# Patient Record
Sex: Female | Born: 1970 | Race: White | Hispanic: No | Marital: Married | Smoking: Former smoker
Health system: Southern US, Community
[De-identification: ages and names within clinical notes are randomized; demographics above are authoritative.]

## PROBLEM LIST (undated history)

## (undated) DIAGNOSIS — E109 Type 1 diabetes mellitus without complications: Secondary | ICD-10-CM

## (undated) DIAGNOSIS — E119 Type 2 diabetes mellitus without complications: Secondary | ICD-10-CM

## (undated) DIAGNOSIS — G629 Polyneuropathy, unspecified: Secondary | ICD-10-CM

## (undated) DIAGNOSIS — E7800 Pure hypercholesterolemia, unspecified: Secondary | ICD-10-CM

## (undated) DIAGNOSIS — F419 Anxiety disorder, unspecified: Secondary | ICD-10-CM

## (undated) DIAGNOSIS — F32A Depression, unspecified: Secondary | ICD-10-CM

## (undated) DIAGNOSIS — E282 Polycystic ovarian syndrome: Secondary | ICD-10-CM

## (undated) DIAGNOSIS — F329 Major depressive disorder, single episode, unspecified: Secondary | ICD-10-CM

## (undated) DIAGNOSIS — I10 Essential (primary) hypertension: Secondary | ICD-10-CM

## (undated) HISTORY — DX: Anxiety disorder, unspecified: F41.9

## (undated) HISTORY — PX: ELBOW SURGERY: SHX618

## (undated) HISTORY — DX: Major depressive disorder, single episode, unspecified: F32.9

## (undated) HISTORY — DX: Type 1 diabetes mellitus without complications: E10.9

## (undated) HISTORY — DX: Depression, unspecified: F32.A

---

## 2012-09-28 ENCOUNTER — Emergency Department (HOSPITAL_COMMUNITY)
Admission: EM | Admit: 2012-09-28 | Discharge: 2012-09-28 | Disposition: A | Discharge status: Home / Self Care | Payer: Self-pay | Attending: Emergency Medicine | Admitting: Emergency Medicine

## 2012-09-28 ENCOUNTER — Encounter (HOSPITAL_COMMUNITY): Payer: Self-pay | Admitting: *Deleted

## 2012-09-28 DIAGNOSIS — IMO0001 Reserved for inherently not codable concepts without codable children: Secondary | ICD-10-CM

## 2012-09-28 DIAGNOSIS — Z8742 Personal history of other diseases of the female genital tract: Secondary | ICD-10-CM | POA: Insufficient documentation

## 2012-09-28 DIAGNOSIS — L239 Allergic contact dermatitis, unspecified cause: Secondary | ICD-10-CM

## 2012-09-28 DIAGNOSIS — R209 Unspecified disturbances of skin sensation: Secondary | ICD-10-CM | POA: Insufficient documentation

## 2012-09-28 DIAGNOSIS — Z87891 Personal history of nicotine dependence: Secondary | ICD-10-CM | POA: Insufficient documentation

## 2012-09-28 DIAGNOSIS — Z8669 Personal history of other diseases of the nervous system and sense organs: Secondary | ICD-10-CM | POA: Insufficient documentation

## 2012-09-28 DIAGNOSIS — E119 Type 2 diabetes mellitus without complications: Secondary | ICD-10-CM | POA: Insufficient documentation

## 2012-09-28 DIAGNOSIS — I10 Essential (primary) hypertension: Secondary | ICD-10-CM

## 2012-09-28 HISTORY — DX: Essential (primary) hypertension: I10

## 2012-09-28 HISTORY — DX: Pure hypercholesterolemia, unspecified: E78.00

## 2012-09-28 HISTORY — DX: Type 2 diabetes mellitus without complications: E11.9

## 2012-09-28 HISTORY — DX: Polyneuropathy, unspecified: G62.9

## 2012-09-28 HISTORY — DX: Polycystic ovarian syndrome: E28.2

## 2012-09-28 MED ORDER — METFORMIN HCL 1000 MG PO TABS: 1000.0000 mg | ORAL_TABLET | Freq: Two times a day (BID) | ORAL | Status: DC

## 2012-09-28 MED ORDER — INSULIN GLULISINE 100 UNIT/ML IJ SOLN: 40.0000 [IU] | Freq: Three times a day (TID) | INTRAMUSCULAR | Status: DC

## 2012-09-28 MED ORDER — ESCITALOPRAM OXALATE 10 MG PO TABS: 10.0000 mg | ORAL_TABLET | Freq: Every day | ORAL | Status: DC

## 2012-09-28 MED ORDER — LISINOPRIL-HYDROCHLOROTHIAZIDE 10-12.5 MG PO TABS: 1.0000 | ORAL_TABLET | Freq: Every day | ORAL | Status: DC

## 2012-09-28 MED ORDER — ATORVASTATIN CALCIUM 10 MG PO TABS: 20.0000 mg | ORAL_TABLET | Freq: Every day | ORAL | Status: DC

## 2012-09-28 MED ORDER — INSULIN GLARGINE 100 UNIT/ML ~~LOC~~ SOLN: 80.0000 [IU] | Freq: Every day | SUBCUTANEOUS | Status: DC

## 2012-09-28 MED ORDER — TRAMADOL HCL 50 MG PO TABS: 50.0000 mg | ORAL_TABLET | Freq: Four times a day (QID) | ORAL | Status: DC | PRN

## 2012-09-28 NOTE — ED Provider Notes (Signed)
CSN: [id number]     Arrival date & time 09/28/12  1727 History  This chart was scribed for non-physician practitioner Wynetta Emery, PA-C working with Gwyneth Sprout, MD by Danella Maiers, ED Scribe. This patient was seen in room TR07C/TR07C and the patient's care was started at 5:40 PM.     Chief Complaint  Patient presents with  . Hip Pain   Patient is a 42 y.o. female presenting with hip pain. The history is provided by the patient. No language interpreter was used.  Hip Pain   HPI Comments: [name] is a 42 y.o. female with a h/o DM who presents to the Emergency Department complaining of left hip pain that started on July 21, self-resolved, then came back as a stabbing pain today while she was walking. She states that pressing on the area makes it worse. She reports that in July her skin was painful to touch and felt as though it were bruised. She saw a doctor the first time she felt the pain and he told her there was nothing wrong. She takes ibuprofen with relief. She was on lantis, humalog, metformin, neurontin for DM but stopped taking it when she lost her insurance. She denies injury, urinary and bowel problems, vaginal discharge, nausea, emesis.   Past Medical History  Diagnosis Date  . Diabetes mellitus without complication   . Hypertension   . High cholesterol   . Neuropathy   . PCOS (polycystic ovarian syndrome)    History reviewed. No pertinent past surgical history. History reviewed. No pertinent family history. History  Substance Use Topics  . Smoking status: Former Games developer  . Smokeless tobacco: Not on file  . Alcohol Use: No   OB History   Grav Para Term Preterm Abortions TAB SAB Ect Mult Living                 Review of Systems A complete 10 system review of systems was obtained and all systems are negative except as noted in the HPI and PMH.   Allergies  Percocet  Home Medications  No current outpatient prescriptions on file. BP 151/94  Pulse  102  Temp(Src) 98.5 F (36.9 C) (Oral)  Resp 22  SpO2 97%  LMP 09/09/2012 Physical Exam  Nursing note and vitals reviewed. Constitutional: She is oriented to person, place, and time. She appears well-developed and well-nourished. No distress.  HENT:  Head: Normocephalic.  Eyes: Conjunctivae and EOM are normal. Pupils are equal, round, and reactive to light.  Neck: Normal range of motion.  Cardiovascular: Normal rate.   Pulmonary/Chest: Effort normal and breath sounds normal. No stridor. No respiratory distress. She has no wheezes. She has no rales.  Abdominal: Soft. Bowel sounds are normal. She exhibits no distension and no mass. There is no tenderness. There is no rebound and no guarding.  Musculoskeletal: Normal range of motion.  No hip pain, full range of motion to left hip, no skin changes or rash. Abdominal exam shows normal bowel sounds with no tenderness to palpation however patient has hypersensitivity to light touch of the left lower cord and abdominal skin  Neurological: She is alert and oriented to person, place, and time.  Psychiatric: She has a normal mood and affect.    ED Course  Procedures (including critical care time) Medications - No data to display  DIAGNOSTIC STUDIES: Oxygen Saturation is 97% on RA, normal by my interpretation.    COORDINATION OF CARE: 7:04 PM- Discussed treatment plan with pt which includes pain  meds, prescription for the DM medications she was on previously, and referral to [facility]. Pt agrees to plan.   Labs Review Labs Reviewed - No data to display Imaging Review No results found.  MDM   1. Cutaneous hypersensitivity   2. IDDM (insulin dependent diabetes mellitus)   3. HTN (hypertension)     Filed Vitals:   09/28/12 1733  BP: 151/94  Pulse: 102  Temp: 98.5 F (36.9 C)  TempSrc: Oral  Resp: 22  SpO2: 97%     [name] is a 42 y.o. female with normal hip and abdominal exam, skin hypersensitiy. She has been  without her insulin, statins and hypertension medication for several months. She scheduled to have her insurance start in December, I have restarted most of her medications and asked her to follow with the wellness clinic.  Pt is hemodynamically stable, appropriate for, and amenable to discharge at this time. Pt verbalized understanding and agrees with care plan. All questions answered. Outpatient follow-up and specific return precautions discussed.    Discharge Medication List as of 09/28/2012  8:01 PM    START taking these medications   Details  atorvastatin (LIPITOR) 10 MG tablet Take 2 tablets (20 mg total) by mouth daily., Starting 09/28/2012, Until Discontinued, Print    escitalopram (LEXAPRO) 10 MG tablet Take 1 tablet (10 mg total) by mouth daily., Starting 09/28/2012, Until Discontinued, Print    insulin glargine (LANTUS) 100 UNIT/ML injection Inject 0.8 mLs (80 Units total) into the skin at bedtime., Starting 09/28/2012, Until Discontinued, Print    insulin glulisine (APIDRA) 100 UNIT/ML injection Inject 40 Units into the skin 3 (three) times daily before meals. 30-50 Units TID, Starting 09/28/2012, Until Discontinued, Print    lisinopril-hydrochlorothiazide (PRINZIDE) 10-12.5 MG per tablet Take 1 tablet by mouth daily., Starting 09/28/2012, Until Discontinued, Print    !! metFORMIN (GLUCOPHAGE) 1000 MG tablet Take 1 tablet (1,000 mg total) by mouth 2 (two) times daily., Starting 09/28/2012, Until Discontinued, Print    traMADol (ULTRAM) 50 MG tablet Take 1 tablet (50 mg total) by mouth every 6 (six) hours as needed for pain., Starting 09/28/2012, Until Discontinued, Print     !! - Potential duplicate medications found. Please discuss with provider.      Note: Portions of this report may have been transcribed using voice recognition software. Every effort was made to ensure accuracy; however, inadvertent computerized transcription errors may be present    Wynetta Emery,  PA-C 09/29/12 0047

## 2012-09-28 NOTE — ED Notes (Signed)
Pharmacy tech paged to do medication reconciliation per PA

## 2012-09-28 NOTE — ED Notes (Signed)
Pt reports having pain left side of back and left hip in July, it resolved by itself, skin was painful to touch but pt denies any rash or blisters. Then had onset again of pain but it is only to left hip area. Denies any injury. Ambulatory at triage.

## 2012-09-29 NOTE — ED Provider Notes (Signed)
Medical screening examination/treatment/procedure(s) were performed by non-physician practitioner and as supervising physician I was immediately available for consultation/collaboration.   Gwyneth Sprout, MD 09/29/12 2316

## 2013-07-19 ENCOUNTER — Encounter (HOSPITAL_COMMUNITY): Payer: Self-pay

## 2013-07-19 ENCOUNTER — Inpatient Hospital Stay (HOSPITAL_COMMUNITY)
Admission: AD | Admit: 2013-07-19 | Discharge: 2013-07-28 | DRG: 885 | Disposition: A | Discharge status: Home / Self Care | Payer: No Typology Code available for payment source | Source: Intra-hospital | Attending: Psychiatry | Admitting: Psychiatry

## 2013-07-19 ENCOUNTER — Emergency Department (HOSPITAL_COMMUNITY)
Admission: EM | Admit: 2013-07-19 | Discharge: 2013-07-19 | Disposition: A | Discharge status: Other Acute Inpatient Hospital | Payer: Self-pay | Attending: Emergency Medicine | Admitting: Emergency Medicine

## 2013-07-19 ENCOUNTER — Encounter (HOSPITAL_COMMUNITY): Payer: Self-pay | Admitting: Emergency Medicine

## 2013-07-19 DIAGNOSIS — I10 Essential (primary) hypertension: Secondary | ICD-10-CM | POA: Diagnosis present

## 2013-07-19 DIAGNOSIS — Z794 Long term (current) use of insulin: Secondary | ICD-10-CM

## 2013-07-19 DIAGNOSIS — F411 Generalized anxiety disorder: Secondary | ICD-10-CM | POA: Diagnosis present

## 2013-07-19 DIAGNOSIS — E7800 Pure hypercholesterolemia, unspecified: Secondary | ICD-10-CM | POA: Insufficient documentation

## 2013-07-19 DIAGNOSIS — E119 Type 2 diabetes mellitus without complications: Secondary | ICD-10-CM | POA: Diagnosis present

## 2013-07-19 DIAGNOSIS — T50902D Poisoning by unspecified drugs, medicaments and biological substances, intentional self-harm, subsequent encounter: Secondary | ICD-10-CM

## 2013-07-19 DIAGNOSIS — Z5989 Other problems related to housing and economic circumstances: Secondary | ICD-10-CM | POA: Diagnosis not present

## 2013-07-19 DIAGNOSIS — F329 Major depressive disorder, single episode, unspecified: Principal | ICD-10-CM | POA: Diagnosis present

## 2013-07-19 DIAGNOSIS — Z8669 Personal history of other diseases of the nervous system and sense organs: Secondary | ICD-10-CM | POA: Insufficient documentation

## 2013-07-19 DIAGNOSIS — R45851 Suicidal ideations: Secondary | ICD-10-CM | POA: Diagnosis not present

## 2013-07-19 DIAGNOSIS — Z87891 Personal history of nicotine dependence: Secondary | ICD-10-CM | POA: Diagnosis not present

## 2013-07-19 DIAGNOSIS — F4310 Post-traumatic stress disorder, unspecified: Secondary | ICD-10-CM | POA: Diagnosis present

## 2013-07-19 DIAGNOSIS — T50902A Poisoning by unspecified drugs, medicaments and biological substances, intentional self-harm, initial encounter: Secondary | ICD-10-CM | POA: Insufficient documentation

## 2013-07-19 DIAGNOSIS — F429 Obsessive-compulsive disorder, unspecified: Secondary | ICD-10-CM | POA: Diagnosis present

## 2013-07-19 DIAGNOSIS — T50904A Poisoning by unspecified drugs, medicaments and biological substances, undetermined, initial encounter: Secondary | ICD-10-CM

## 2013-07-19 DIAGNOSIS — G4700 Insomnia, unspecified: Secondary | ICD-10-CM | POA: Diagnosis present

## 2013-07-19 DIAGNOSIS — G589 Mononeuropathy, unspecified: Secondary | ICD-10-CM | POA: Diagnosis present

## 2013-07-19 DIAGNOSIS — K219 Gastro-esophageal reflux disease without esophagitis: Secondary | ICD-10-CM | POA: Diagnosis present

## 2013-07-19 DIAGNOSIS — E282 Polycystic ovarian syndrome: Secondary | ICD-10-CM | POA: Insufficient documentation

## 2013-07-19 DIAGNOSIS — R51 Headache: Secondary | ICD-10-CM | POA: Diagnosis present

## 2013-07-19 DIAGNOSIS — T424X4A Poisoning by benzodiazepines, undetermined, initial encounter: Secondary | ICD-10-CM | POA: Insufficient documentation

## 2013-07-19 DIAGNOSIS — T438X2A Poisoning by other psychotropic drugs, intentional self-harm, initial encounter: Secondary | ICD-10-CM | POA: Insufficient documentation

## 2013-07-19 DIAGNOSIS — Z5987 Material hardship due to limited financial resources, not elsewhere classified: Secondary | ICD-10-CM

## 2013-07-19 DIAGNOSIS — Z79899 Other long term (current) drug therapy: Secondary | ICD-10-CM | POA: Insufficient documentation

## 2013-07-19 DIAGNOSIS — Z598 Other problems related to housing and economic circumstances: Secondary | ICD-10-CM

## 2013-07-19 DIAGNOSIS — IMO0002 Reserved for concepts with insufficient information to code with codable children: Secondary | ICD-10-CM

## 2013-07-19 DIAGNOSIS — Z3202 Encounter for pregnancy test, result negative: Secondary | ICD-10-CM | POA: Insufficient documentation

## 2013-07-19 DIAGNOSIS — T43502A Poisoning by unspecified antipsychotics and neuroleptics, intentional self-harm, initial encounter: Secondary | ICD-10-CM | POA: Insufficient documentation

## 2013-07-19 LAB — CBC WITH DIFFERENTIAL/PLATELET
BASOS ABS: 0 10*3/uL (ref 0.0–0.1)
Basophils Relative: 1 % (ref 0–1)
EOS PCT: 2 % (ref 0–5)
Eosinophils Absolute: 0.2 10*3/uL (ref 0.0–0.7)
HCT: 39.3 % (ref 36.0–46.0)
Hemoglobin: 14.3 g/dL (ref 12.0–15.0)
LYMPHS ABS: 2.4 10*3/uL (ref 0.7–4.0)
LYMPHS PCT: 27 % (ref 12–46)
MCH: 30.6 pg (ref 26.0–34.0)
MCHC: 36.4 g/dL — ABNORMAL HIGH (ref 30.0–36.0)
MCV: 84 fL (ref 78.0–100.0)
Monocytes Absolute: 0.6 10*3/uL (ref 0.1–1.0)
Monocytes Relative: 6 % (ref 3–12)
NEUTROS ABS: 5.7 10*3/uL (ref 1.7–7.7)
NEUTROS PCT: 64 % (ref 43–77)
Platelets: 265 10*3/uL (ref 150–400)
RBC: 4.68 MIL/uL (ref 3.87–5.11)
RDW: 12.7 % (ref 11.5–15.5)
WBC: 8.8 10*3/uL (ref 4.0–10.5)

## 2013-07-19 LAB — RAPID URINE DRUG SCREEN, HOSP PERFORMED
Amphetamines: NOT DETECTED
BARBITURATES: NOT DETECTED
Benzodiazepines: NOT DETECTED
COCAINE: NOT DETECTED
Opiates: NOT DETECTED
TETRAHYDROCANNABINOL: NOT DETECTED

## 2013-07-19 LAB — COMPREHENSIVE METABOLIC PANEL
ALK PHOS: 70 U/L (ref 39–117)
ALT: 11 U/L (ref 0–35)
AST: 10 U/L (ref 0–37)
Albumin: 2.7 g/dL — ABNORMAL LOW (ref 3.5–5.2)
Anion gap: 18 — ABNORMAL HIGH (ref 5–15)
BUN: 6 mg/dL (ref 6–23)
CO2: 23 meq/L (ref 19–32)
Calcium: 8.2 mg/dL — ABNORMAL LOW (ref 8.4–10.5)
Chloride: 94 mEq/L — ABNORMAL LOW (ref 96–112)
Creatinine, Ser: 0.4 mg/dL — ABNORMAL LOW (ref 0.50–1.10)
GFR calc Af Amer: >90 mL/min (ref >90)
GLUCOSE: 322 mg/dL — AB (ref 70–99)
POTASSIUM: 3.8 meq/L (ref 3.7–5.3)
SODIUM: 135 meq/L — AB (ref 137–147)
Total Bilirubin: 0.4 mg/dL (ref 0.3–1.2)
Total Protein: 6.3 g/dL (ref 6.0–8.3)

## 2013-07-19 LAB — ETHANOL: Alcohol, Ethyl (B): <11 mg/dL (ref 0–11)

## 2013-07-19 LAB — URINALYSIS, ROUTINE W REFLEX MICROSCOPIC
Bilirubin Urine: NEGATIVE
Glucose, UA: >1000 mg/dL — AB
Hgb urine dipstick: NEGATIVE
LEUKOCYTES UA: NEGATIVE
Nitrite: NEGATIVE
PH: 5.5 (ref 5.0–8.0)
Protein, ur: NEGATIVE mg/dL
Specific Gravity, Urine: 1.041 — ABNORMAL HIGH (ref 1.005–1.030)
Urobilinogen, UA: 0.2 mg/dL (ref 0.0–1.0)

## 2013-07-19 LAB — CBG MONITORING, ED: Glucose-Capillary: 221 mg/dL — ABNORMAL HIGH (ref 70–99)

## 2013-07-19 LAB — URINE MICROSCOPIC-ADD ON

## 2013-07-19 LAB — GLUCOSE, CAPILLARY: Glucose-Capillary: 332 mg/dL — ABNORMAL HIGH (ref 70–99)

## 2013-07-19 LAB — POC URINE PREG, ED: Preg Test, Ur: NEGATIVE

## 2013-07-19 LAB — SALICYLATE LEVEL

## 2013-07-19 LAB — ACETAMINOPHEN LEVEL

## 2013-07-19 MED ORDER — ATORVASTATIN CALCIUM 20 MG PO TABS
20.0000 mg | ORAL_TABLET | Freq: Every day | ORAL | Status: DC
  Administered 2013-07-19: 20 mg via ORAL
  Filled 2013-07-19: qty 1

## 2013-07-19 MED ORDER — INSULIN ASPART 100 UNIT/ML ~~LOC~~ SOLN
0.0000 [IU] | Freq: Three times a day (TID) | SUBCUTANEOUS | Status: DC
  Administered 2013-07-20 (×2): 4 [IU] via SUBCUTANEOUS
  Administered 2013-07-20: 15 [IU] via SUBCUTANEOUS
  Administered 2013-07-21: 11 [IU] via SUBCUTANEOUS
  Administered 2013-07-21: 15 [IU] via SUBCUTANEOUS
  Administered 2013-07-22: 4 [IU] via SUBCUTANEOUS
  Administered 2013-07-22 (×2): 7 [IU] via SUBCUTANEOUS
  Administered 2013-07-24: 4 [IU] via SUBCUTANEOUS
  Administered 2013-07-24: 3 [IU] via SUBCUTANEOUS
  Administered 2013-07-24 – 2013-07-26 (×2): 7 [IU] via SUBCUTANEOUS
  Administered 2013-07-26: 11 [IU] via SUBCUTANEOUS
  Administered 2013-07-26: 3 [IU] via SUBCUTANEOUS
  Administered 2013-07-27 (×2): 11 [IU] via SUBCUTANEOUS
  Administered 2013-07-27: 4 [IU] via SUBCUTANEOUS
  Administered 2013-07-28: 11 [IU] via SUBCUTANEOUS
  Administered 2013-07-28: 7 [IU] via SUBCUTANEOUS

## 2013-07-19 MED ORDER — METFORMIN HCL 500 MG PO TABS
1000.0000 mg | ORAL_TABLET | Freq: Two times a day (BID) | ORAL | Status: DC
  Administered 2013-07-19: 1000 mg via ORAL
  Filled 2013-07-19: qty 2

## 2013-07-19 MED ORDER — SODIUM CHLORIDE 0.9 % IV SOLN
INTRAVENOUS | Status: DC
  Administered 2013-07-19: 13:00:00 via INTRAVENOUS

## 2013-07-19 MED ORDER — INSULIN GLARGINE 100 UNIT/ML ~~LOC~~ SOLN
80.0000 [IU] | Freq: Every day | SUBCUTANEOUS | Status: DC
  Filled 2013-07-19: qty 0.8

## 2013-07-19 MED ORDER — INSULIN GLARGINE 100 UNIT/ML ~~LOC~~ SOLN
30.0000 [IU] | Freq: Every day | SUBCUTANEOUS | Status: DC
  Administered 2013-07-19 – 2013-07-26 (×6): 30 [IU] via SUBCUTANEOUS

## 2013-07-19 MED ORDER — HYDROCHLOROTHIAZIDE 12.5 MG PO CAPS: 12.5000 mg | ORAL_CAPSULE | Freq: Every day | ORAL | Status: DC

## 2013-07-19 MED ORDER — ESCITALOPRAM OXALATE 10 MG PO TABS
10.0000 mg | ORAL_TABLET | Freq: Every day | ORAL | Status: DC
  Administered 2013-07-19: 10 mg via ORAL
  Filled 2013-07-19: qty 1

## 2013-07-19 MED ORDER — MAGNESIUM HYDROXIDE 400 MG/5ML PO SUSP: 30.0000 mL | Freq: Every day | ORAL | Status: DC | PRN

## 2013-07-19 MED ORDER — LISINOPRIL 10 MG PO TABS
10.0000 mg | ORAL_TABLET | Freq: Every day | ORAL | Status: AC
  Administered 2013-07-20 – 2013-07-26 (×6): 10 mg via ORAL
  Filled 2013-07-19 (×7): qty 1

## 2013-07-19 MED ORDER — METFORMIN HCL 500 MG PO TABS: 1000.0000 mg | ORAL_TABLET | Freq: Two times a day (BID) | ORAL | Status: DC

## 2013-07-19 MED ORDER — ALUM & MAG HYDROXIDE-SIMETH 200-200-20 MG/5ML PO SUSP: 30.0000 mL | ORAL | Status: DC | PRN

## 2013-07-19 MED ORDER — IBUPROFEN 400 MG PO TABS: 600.0000 mg | ORAL_TABLET | Freq: Three times a day (TID) | ORAL | Status: DC | PRN

## 2013-07-19 MED ORDER — ONDANSETRON HCL 4 MG PO TABS: 4.0000 mg | ORAL_TABLET | Freq: Three times a day (TID) | ORAL | Status: DC | PRN

## 2013-07-19 MED ORDER — NICOTINE 21 MG/24HR TD PT24: 21.0000 mg | MEDICATED_PATCH | Freq: Every day | TRANSDERMAL | Status: DC

## 2013-07-19 MED ORDER — LISINOPRIL-HYDROCHLOROTHIAZIDE 10-12.5 MG PO TABS: 1.0000 | ORAL_TABLET | Freq: Every day | ORAL | Status: DC

## 2013-07-19 MED ORDER — LISINOPRIL 10 MG PO TABS: 10.0000 mg | ORAL_TABLET | Freq: Every day | ORAL | Status: DC
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
## 2013-07-19 NOTE — ED Notes (Signed)
Spoke with the nurse and patient and the patient has agreed to see her 2 visitors.  Call [name] [phone] (boyfriend) or [name] (friend) [phone] if patient is moved.  They have her clothing, cell phone and other belongings.  This nurse ambulated with the visitors to the room and has her belongings at the desk for them to take home with them.

## 2013-07-19 NOTE — ED Notes (Signed)
The pts metformin arrived from pharmacy .  Med given to the pt.  She woke up and read the guidelines to this pod.  No information offered at time given med

## 2013-07-19 NOTE — ED Notes (Signed)
[facility] transport call and [facility]
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
## 2013-07-19 NOTE — ED Notes (Signed)
The pt was wanded when brought back to pod c rm22.  Personal  Valuables placed with  Security.  Clothing one dress only placed in   Room.  Papers placed in log.  Pt supposed to go to [facility] after 1930 tonight.  Pt sleeping unless distrubed
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
## 2013-07-19 NOTE — ED Notes (Signed)
Metformin scheduled at 1700 .  Pharmacy called because it is not in the pyxis.  They will send

## 2013-07-19 NOTE — Tx Team (Signed)
Initial Interdisciplinary Treatment Plan  PATIENT STRENGTHS: (choose at least two) General fund of knowledge  PATIENT STRESSORS: Substance abuse   PROBLEM LIST: Problem List/Patient Goals Date to be addressed Date deferred Reason deferred Estimated date of resolution  ETOH abuse 07/19/13     Risk for Suicide 07/19/13                                                DISCHARGE CRITERIA:  Improved stabilization in mood, thinking, and/or behavior  PRELIMINARY DISCHARGE PLAN: Attend PHP/IOP Attend 12-step recovery group Outpatient therapy  PATIENT/FAMIILY INVOLVEMENT: This treatment plan has been presented to and reviewed with the patient, [name].  The patient and family have been given the opportunity to ask questions and make suggestions.  Jacques Navyhillips, Eston Heslin A 07/19/2013, 10:35 PM
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
## 2013-07-19 NOTE — ED Notes (Signed)
Pt brought from rm 17 to pod c rm 22

## 2013-07-19 NOTE — ED Notes (Signed)
Poison control called

## 2013-07-19 NOTE — Progress Notes (Signed)
Psychoeducational Group Note  Date:  07/19/2013 Time:  2133  Group Topic/Focus:  Wrap-Up Group:   The focus of this group is to help patients review their daily goal of treatment and discuss progress on daily workbooks.  Participation Level: Did Not Attend  Participation Quality:  Not Applicable  Affect:  Not Applicable  Cognitive:  Not Applicable  Insight:  Not Applicable  Engagement in Group: Not Applicable  Additional Comments:  The patient did not attend group this evening since she was admitted to the hallway after the group was completed.   Hazle CocaGOODMAN, Muntaha Vermette S 07/19/2013, 9:33 PM

## 2013-07-20 DIAGNOSIS — F4310 Post-traumatic stress disorder, unspecified: Secondary | ICD-10-CM

## 2013-07-20 DIAGNOSIS — F429 Obsessive-compulsive disorder, unspecified: Secondary | ICD-10-CM

## 2013-07-20 LAB — GLUCOSE, CAPILLARY
GLUCOSE-CAPILLARY: 176 mg/dL — AB (ref 70–99)
GLUCOSE-CAPILLARY: 126 mg/dL — AB (ref 70–99)
Glucose-Capillary: 325 mg/dL — ABNORMAL HIGH (ref 70–99)
Glucose-Capillary: 170 mg/dL — ABNORMAL HIGH (ref 70–99)

## 2013-07-20 LAB — HEMOGLOBIN A1C
HEMOGLOBIN A1C: 13.2 % — AB (ref <5.7)
MEAN PLASMA GLUCOSE: 332 mg/dL — AB (ref <117)

## 2013-07-20 NOTE — [facility] Suicide Risk Assessment (Signed)
   Nursing information obtained from:    Demographic factors:   43 year old female, lives alone, single, employed. Current Mental Status:   See MSE  Loss Factors:   Recent argument with boyfriend Historical Factors:   Recent overdose on Klonopin, denies prior psychiatric history Risk Reduction Factors:    Total Time spent with patient: 45 minutes  CLINICAL FACTORS:   Depression:   Impulsivity  Psychiatric Specialty Exam: Physical Exam  ROS  Blood pressure 129/83, pulse 98, temperature 97.9 F (36.6 C), temperature source Oral, resp. rate 16, height 5\' 3"  (1.6 m), weight 90.719 kg (200 lb), last menstrual period 07/05/2013.Body mass index is 35.44 kg/(m^2).  General Appearance: Fairly Groomed  Patent attorneyye Contact::  Good  Speech:  Normal Rate  Volume:  Normal  Mood:  mildly dysphoric, depressed , irritable  Affect:  Congruent and mildly irritable  Thought Process:  Goal Directed and Linear  Orientation:  NA  Thought Content:  no hallucinations, no delusions  Suicidal Thoughts:  No- denies any current suicidal ideations, no homicidal ideations, and contracts for safety on the unit  Homicidal Thoughts:  No  Memory:  NA  Judgement:  Fair  Insight:  Fair  Psychomotor Activity:  Normal  Concentration:  Good  Recall:  Good  Fund of Knowledge:Good  Language: Good  Akathisia:  Negative  Handed:  Right  AIMS (if indicated):     Assets:  Communication Skills Desire for Improvement Resilience  Sleep:  Number of Hours: 6.75   Musculoskeletal: Strength & Muscle Tone: within normal limits Gait & Station: normal Patient leans: N/A  COGNITIVE FEATURES THAT CONTRIBUTE TO RISK:  Closed-mindedness    SUICIDE RISK:   Moderate:  Frequent suicidal ideation with limited intensity, and duration, some specificity in terms of plans, no associated intent, good self-control, limited dysphoria/symptomatology, some risk factors present, and identifiable protective factors, including available and  accessible social support.  PLAN OF CARE:Patient will be admitted to inpatient psychiatric unit for stabilization and safety. Will provide and encourage milieu participation. Provide medication management and maked adjustments as needed.  Will follow daily.    I certify that inpatient services furnished can reasonably be expected to improve the patient's condition.  COBOS, FERNANDO 07/20/2013, 6:12 PM

## 2013-07-20 NOTE — Progress Notes (Signed)
Psychoeducational Group Note  Date: 07/20/2013 Time: 1100  Group Topic/Focus:  Making Healthy Choices:   The focus of this group is to help patients identify negative/unhealthy choices they were using prior to admission and identify positive/healthier coping strategies to replace them upon discharge.  Participation Level:  Did Not Attend  PAdditional Comments:    07/20/2013,6:34 PM Rich Braveuke, Jacqueli Pangallo Lynn

## 2013-07-20 NOTE — Treatment Plan (Signed)
Spoke with pt's boyfriend and coworker/friend.  They wanted to share that boyfriend will be staying with patient after discharge and that the coworker would be spending time with her at work to make sure she stays safe.  They report that the pt has an appointment with Dr. Ricki MillerPang on Tuesday morning for a mental evaluation.  Dr. Lynne LoganPang's   I explained the 72 hour request for discharge to them both and they verbalized understanding.

## 2013-07-20 NOTE — Progress Notes (Signed)
Patient ID: [name] BossierMichelle XXXUnsworth, female   DOB: Nov 26, 1970, 43 y.o.   MRN: [id number]  Patient currently asleep; no s/s of distress noted at this time. Respirations regular and unlabored.

## 2013-07-20 NOTE — Progress Notes (Signed)
Patient ID: Consuello BossierMichelle XXXUnsworth, female   DOB: 22-Aug-1970, 43 y.o.   MRN: [id number]   D: Pt has been very tearful on the unit today, she reported that she wanted to be discharged. Pt called her family and [facility] because she walked in and she was going to walk out. Pt reported that she has to go to work and that she was going to leave. Pt advised that she could not just walk out, but was made aware of the 72 hour request for discharge. Pt signed the 72 hour request for discharge at [place] Cutter10am, Takia NP was made aware. Pts [facility] at lunch demanding to speak to the doctor reporting that they could take care of patient at home. [facility] was notified, he spoke to family and reinforced that patient could not be discharged. Fredna Dowakia NP was made aware that family wanted to speak to her, Fredna Dowakia reported that she would give family a call.  Pt reported being negative SI/HI, no AH/VH noted. A: 15 min checks continued for patient safety. R: Pt safety maintained.

## 2013-07-20 NOTE — Progress Notes (Signed)
D Pt. Denies SI and HI, no complaints of pain or discomfort noted.  A Writer offered support and encouragement, attempted to discuss reason for admission with pt.  R Pt. Did not want to get out of bed to attend group, is not vested in her treatment, just wants to discharge, refused her lantus this HS, and would only respond she rated depression anxiety and hopelessness all a 0.

## 2013-07-20 NOTE — [facility] Group Notes (Signed)
[facility] Group Notes: (Clinical Social Work)   07/20/2013      Type of Therapy:  Group Therapy   Participation Level:  Did Not Attend    Aliannah Holstrom Grossman-Orr, LCSW 07/20/2013, 3:58 PM     

## 2013-07-20 NOTE — [facility] Group Notes (Signed)
[facility] Group Notes:  (Nursing/MHT/Case Management/Adjunct)  Date:  07/20/2013  Time:  10:53 AM  Type of Therapy:  Psychoeducational Skills  Participation Level:  Did Not Attend   Buford DresserForrest, Naszir Cott Shanta 07/20/2013, 10:53 AM

## 2013-07-20 NOTE — Progress Notes (Signed)
Psychoeducational Group Note  Date:  07/20/2013 Time:  2120  Group Topic/Focus:  Wrap-Up Group:   The focus of this group is to help patients review their daily goal of treatment and discuss progress on daily workbooks.  Participation Level: Did Not Attend  Participation Quality:  Not Applicable  Affect:  Not Applicable  Cognitive:  Not Applicable  Insight:  Not Applicable  Engagement in Group: Not Applicable  Additional Comments:  The patient did not attend group this evening since she was asleep in her bedroom.   Hazle CocaGOODMAN, Yakelin Grenier S 07/20/2013, 9:25 PM
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
## 2013-07-21 DIAGNOSIS — F322 Major depressive disorder, single episode, severe without psychotic features: Secondary | ICD-10-CM

## 2013-07-21 LAB — GLUCOSE, CAPILLARY
GLUCOSE-CAPILLARY: 252 mg/dL — AB (ref 70–99)
Glucose-Capillary: 242 mg/dL — ABNORMAL HIGH (ref 70–99)
Glucose-Capillary: 434 mg/dL — ABNORMAL HIGH (ref 70–99)

## 2013-07-21 MED ORDER — INSULIN GLARGINE 100 UNIT/ML SOLOSTAR PEN: 60.0000 [IU] | PEN_INJECTOR | Freq: Every day | SUBCUTANEOUS | Status: DC | PRN

## 2013-07-21 MED ORDER — ESCITALOPRAM OXALATE 10 MG PO TABS
10.0000 mg | ORAL_TABLET | Freq: Every day | ORAL | Status: DC
  Administered 2013-07-21 – 2013-07-22 (×2): 10 mg via ORAL
  Filled 2013-07-21 (×4): qty 1

## 2013-07-21 MED ORDER — NORTRIPTYLINE HCL 25 MG PO CAPS
25.0000 mg | ORAL_CAPSULE | Freq: Every day | ORAL | Status: DC
  Administered 2013-07-21: 25 mg via ORAL
  Filled 2013-07-21 (×4): qty 1

## 2013-07-21 MED ORDER — IBUPROFEN 200 MG PO TABS
400.0000 mg | ORAL_TABLET | Freq: Four times a day (QID) | ORAL | Status: DC | PRN
  Administered 2013-07-21 – 2013-07-23 (×2): 400 mg via ORAL
  Filled 2013-07-21 (×2): qty 2

## 2013-07-21 MED ORDER — FAMOTIDINE 20 MG PO TABS
40.0000 mg | ORAL_TABLET | Freq: Two times a day (BID) | ORAL | Status: DC
  Administered 2013-07-21 – 2013-07-28 (×15): 40 mg via ORAL
  Filled 2013-07-21 (×9): qty 1
  Filled 2013-07-21: qty 2
  Filled 2013-07-21 (×8): qty 1

## 2013-07-21 MED ORDER — GABAPENTIN 600 MG PO TABS
600.0000 mg | ORAL_TABLET | Freq: Two times a day (BID) | ORAL | Status: DC
  Administered 2013-07-21 – 2013-07-22 (×3): 600 mg via ORAL
  Filled 2013-07-21 (×7): qty 1

## 2013-07-21 MED ORDER — CLONAZEPAM 0.5 MG PO TABS
0.5000 mg | ORAL_TABLET | Freq: Every day | ORAL | Status: DC
  Administered 2013-07-21: 0.5 mg via ORAL
  Filled 2013-07-21: qty 1

## 2013-07-21 MED ORDER — ASPIRIN 81 MG PO CHEW
81.0000 mg | CHEWABLE_TABLET | Freq: Every day | ORAL | Status: DC
  Administered 2013-07-21 – 2013-07-28 (×8): 81 mg via ORAL
  Filled 2013-07-21 (×9): qty 1

## 2013-07-21 MED ORDER — HYDROCHLOROTHIAZIDE 25 MG PO TABS
25.0000 mg | ORAL_TABLET | Freq: Every day | ORAL | Status: DC
  Administered 2013-07-22 – 2013-07-28 (×7): 25 mg via ORAL
  Filled 2013-07-21 (×9): qty 1
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
## 2013-07-21 NOTE — [facility] Group Notes (Signed)
[facility] LCSW Group Therapy  07/21/2013 1:15pm   Pt did not attend, left day room when group began.  Chad CordialLauren Carter, LCSWA 07/21/2013 3:27 PM

## 2013-07-21 NOTE — [facility] Group Notes (Signed)
[facility]  07/21/2013 8:45 AM  Pt did not attend, sleeping in room.  Chad CordialLauren Carter, LCSWA 07/21/2013 10:11 AM

## 2013-07-21 NOTE — Progress Notes (Signed)
Patient ID: Consuello BossierMichelle XXXUnsworth, female   DOB: [birth year], 43 y.o.   MRN: [id number] D- Patient reports fair sleep last night and fair appetite this am.  She says she hasn't eaten since being here because she doesn't like the food.  Her concentration is poor and this morning she had a headache that was affecting the vision in her R eye.  A- NP made aware and patient experienced relief after taking Ibuprophen.  She has also been declining insulin.  She says that she was able to keep her CBG's "perfect when I was having fertility treatments".  Also says she does not want to gain weight on insulin.  Asked patient to consider taking coverage for her CBG's at mealtimes and she agreed to do this. She went to the cafeteria and chose her own carb modified diet.   She continues to spend a lot of time in her room.  Plan to encourage her to attend pm groups now that headache has lifted.

## 2013-07-22 LAB — GLUCOSE, CAPILLARY
GLUCOSE-CAPILLARY: 348 mg/dL — AB (ref 70–99)
GLUCOSE-CAPILLARY: 185 mg/dL — AB (ref 70–99)
Glucose-Capillary: 236 mg/dL — ABNORMAL HIGH (ref 70–99)
Glucose-Capillary: 227 mg/dL — ABNORMAL HIGH (ref 70–99)
Glucose-Capillary: 169 mg/dL — ABNORMAL HIGH (ref 70–99)

## 2013-07-22 MED ORDER — GABAPENTIN 300 MG PO CAPS
600.0000 mg | ORAL_CAPSULE | Freq: Three times a day (TID) | ORAL | Status: DC
  Administered 2013-07-22 – 2013-07-28 (×18): 600 mg via ORAL
  Filled 2013-07-22: qty 84
  Filled 2013-07-22: qty 2
  Filled 2013-07-22: qty 84
  Filled 2013-07-22: qty 2
  Filled 2013-07-22 (×2): qty 84
  Filled 2013-07-22 (×2): qty 2
  Filled 2013-07-22 (×2): qty 84
  Filled 2013-07-22 (×2): qty 2
  Filled 2013-07-22: qty 84
  Filled 2013-07-22: qty 2
  Filled 2013-07-22: qty 84
  Filled 2013-07-22: qty 2
  Filled 2013-07-22: qty 84
  Filled 2013-07-22 (×4): qty 2
  Filled 2013-07-22 (×2): qty 84

## 2013-07-22 MED ORDER — CLONAZEPAM 0.5 MG PO TABS: 0.2500 mg | ORAL_TABLET | Freq: Every day | ORAL | Status: DC

## 2013-07-22 MED ORDER — ESCITALOPRAM OXALATE 5 MG PO TABS
15.0000 mg | ORAL_TABLET | Freq: Every day | ORAL | Status: DC
  Administered 2013-07-23 – 2013-07-24 (×2): 15 mg via ORAL
  Administered 2013-07-25: 08:00:00 via ORAL
  Administered 2013-07-26 – 2013-07-28 (×3): 15 mg via ORAL
  Filled 2013-07-22 (×7): qty 1

## 2013-07-22 MED ORDER — ZOLPIDEM TARTRATE 5 MG PO TABS
5.0000 mg | ORAL_TABLET | Freq: Every evening | ORAL | Status: DC | PRN
  Administered 2013-07-23: 5 mg via ORAL
  Filled 2013-07-22 (×2): qty 1

## 2013-07-22 NOTE — Progress Notes (Signed)
Recreation Therapy Notes  Animal-Assisted Activity/Therapy (AAA/T) Program Checklist/Progress Notes Patient Eligibility Criteria Checklist & Daily Group note for Rec Tx Intervention  Date: 07.21.2015 Time: 3:15pm [address]    AAA/T Program Assumption of Risk Form signed by Patient/ or Parent Legal Guardian yes  Patient is free of allergies or sever asthma yes  Patient reports no fear of animals yes  Patient reports no history of cruelty to animals yes   Patient understands his/her participation is voluntary yes  Behavioral Response: Did not attend.   Marykay Lexenise L Leilyn Frayre, LRT/CTRS  Jearl KlinefelterBlanchfield, Avrom Robarts L 07/22/2013 4:44 PM

## 2013-07-22 NOTE — Progress Notes (Signed)
Patient ID: [name], female   DOB: [birth year], 43 y.o.   MRN: [id number] D- Patient reports her sleep was poor and she did not request sleep med.  Her appetite is poor also.  She is rating hr depression at 8/10, hopelessness at 8/10 and anxiety at 10/10.  She has been having some thoughts of suicide.  Although she feels better than yesterday, she reports she again has a headache.  She did not write a goal for today and she is unsure of her discharge plan.  A- Patient's BP was  initially low this am and BP meds held,but then given when manuel recheck was within normal limits.  Supported patient.  R-Patient is attending groups and interacting with peers and staff.

## 2013-07-22 NOTE — Progress Notes (Signed)
Pt resting in bed with eyes closed.  No distress observed.  Respirations even/unlabored.  Safety maintained with q15 minute checks. 

## 2013-07-22 NOTE — Progress Notes (Signed)
Adult Psychoeducational Group Note  Date:  07/22/2013 Time:  10:55 PM  Group Topic/Focus:  Goals Group:   The focus of this group is to help patients establish daily goals to achieve during treatment and discuss how the patient can incorporate goal setting into their daily lives to aide in recovery.  Participation Level:  Active  Participation Quality:  Appropriate  Affect:  Appropriate  Cognitive:  Appropriate  Insight: Appropriate  Engagement in Group:  Engaged  Modes of Intervention:  Discussion  Additional Comments:  Pt stated that waking up and being alive is all she is happy about.  Terie PurserParker, Ivan Lacher R 07/22/2013, 10:55 PM
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
## 2013-07-22 NOTE — Progress Notes (Signed)
Patient is laughing and joking with peers. Flirting with a female patient. Patient stated that she did not need to be on a 1:1 anymore because she was fine. Patient educated that her previous behavior is the basis for the 1:1 and her inability to contract for safety. Patient upset that she will not be able to participate in activities with her peers, such as dinner. Will continue to monitor.
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
## 2013-07-22 NOTE — [facility] Group Notes (Signed)
[facility] Group Notes:  orientation Date:  07/22/2013  Time:  10:00 AM  Type of Therapy:  Nurse Education  Participation Level:  Active  Participation Quality:  Appropriate  Affect:  Appropriate  Cognitive:  Alert  Insight:  Appropriate  Engagement in Group:  Engaged  Modes of Intervention:  Discussion  Summary of Progress/Problems:  Nicole CellaWebb, Kinzie Wickes Guyes 07/22/2013, 10:00 AM
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
## 2013-07-22 NOTE — Progress Notes (Signed)
Patient signed a 72 hour form at 2230. Form is on front of chart.
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
## 2013-07-22 NOTE — Progress Notes (Signed)
Patient is becoming increasingly agitated because she wants to leave. Patient is cussing and saying that she has no rights. Writer spoke with patient and she stated that she "is over 18 and we have not arrested her, so why can't I walk out the fucking door." Patient offered a 72 hour form, and patient declined. AC called and asked to speak with patient. AC explained that patient had to be evaluated by a physician before she could be discharged, and patient stated that she wasn't talking to the doctor that just sat and typed and changed her medications, because he didn't know her. Patient is angry that she is on a one to one. Patient educated about why she is on a one to one and stated that she "didn't need a fucking babysitter." Patient offered nighttime medications and refused. Patient stated that until she was discharged, she was going to refuse to eat, or take her medicine. Patient walked back to room. Will continue to monitor.

## 2013-07-23 LAB — GLUCOSE, CAPILLARY
GLUCOSE-CAPILLARY: 226 mg/dL — AB (ref 70–99)
Glucose-Capillary: 169 mg/dL — ABNORMAL HIGH (ref 70–99)
Glucose-Capillary: 281 mg/dL — ABNORMAL HIGH (ref 70–99)
Glucose-Capillary: 250 mg/dL — ABNORMAL HIGH (ref 70–99)

## 2013-07-23 MED ORDER — HYDROXYZINE HCL 50 MG PO TABS
ORAL_TABLET | ORAL | Status: AC
Start: 2013-07-23 — End: 2013-07-24
  Filled 2013-07-23: qty 1

## 2013-07-23 MED ORDER — HYDROXYZINE HCL 50 MG PO TABS
50.0000 mg | ORAL_TABLET | Freq: Once | ORAL | Status: AC
  Administered 2013-07-23: 50 mg via ORAL

## 2013-07-23 NOTE — Progress Notes (Signed)
Patient continues on 1:1. Resting in bed with eyes closed. Voicing no complaints. Safety maintained. [name] CoastGoodman, Hasset Chaviano K, RN

## 2013-07-23 NOTE — Progress Notes (Signed)
Adult Psychoeducational Group Note  Date:  07/23/2013 Time:  10:00am Group Topic/Focus:  Personal Choices and Values:   The focus of this group is to help patients assess and explore the importance of values in their lives, how their values affect their decisions, how they express their values and what opposes their expression.  Participation Level:  Minimal  Participation Quality:  Appropriate and Attentive  Affect:  Appropriate  Cognitive:  Alert and Appropriate  Insight: Appropriate and Good  Engagement in Group:  Engaged  Modes of Intervention:  Discussion and Education  Additional Comments:   Pt attended and participated in group. Discussion was on personal development and what it means to you. Pt stated personal development means improvement on self continuously.  Shelly BombardGarner, Bonifacio Pruden D 07/23/2013, 4:19 PM

## 2013-07-23 NOTE — Progress Notes (Signed)
1:1 Nursing Note  D: Patient is sitting in dayroom, coloring a picture and conversing with peers.  A: Continue 1:1 patient observation and Q15 minute checks for safety.  R: Patient receptive and safe on the unit.

## 2013-07-23 NOTE — Progress Notes (Signed)
Patient continues on 1:1. Resting in bed with eyes closed. Safety maintained. [name], Kierstan Auer K, RN

## 2013-07-23 NOTE — Progress Notes (Signed)
1:1 Nursing Note  D: Patient in dayroom, participating in group; calm, cooperative; no signs or symptoms of distress noted.  A: Maintain 1:1 patient observation and Q15 minute checks for safety.  R: Patient remains safe on the unit.

## 2013-07-23 NOTE — Progress Notes (Signed)
D: Patient's affect appropriate to circumstance and mood is anxious. She reported on the self inventory sheet that sleep and ability to concentrate are both good, appetite is poor, and energy level is high. Patient rated depression and feelings of hopelessness "0" and anxiety "2". She's attending groups and visible in the milieu. Patient adheres to current medication regimen.  A: Support and encouragement provided to patient. Administered scheduled medications per ordering MD. Monitor Q15 minute checks for safety.  R: Patient receptive. Denies SI/HI/AVH. Patient remains safe on the unit.

## 2013-07-23 NOTE — Progress Notes (Signed)
1:1 Patient observation has been discontinued per MD. Patient in the hallway with peers, conversing and laughing with others. No signs or symptoms noted. Patient remains safe on the unit.
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
## 2013-07-23 NOTE — [facility] Group Notes (Addendum)
[facility] LCSW Aftercare Discharge Planning Group Note   07/23/2013  10:11 AM   Participation QualUh [facility]ity: Alert, Appropriate and Oriented  Mood/Affect: Euthymic and Appropriate   Depression Rating: 0  Anxiety Rating: 0  Thoughts of Suicide: Pt denies SI/HI  Will you contract for safety? Yes  Current AVH: Pt denies  Plan for Discharge/Comments: Pt attended discharge planning group and actively participated in group. CSW provided pt with today's workbook. Patient reports that she is feeling "good" today and reports that her plan is to [place] at [place] and reports that she will have transportation home. She denies having a current mental health outpatient provider at this time and denies wanting to start outpatient services at this time. After group, patient and CSW discussed [facility] and CSW offered to provide information to patient, patient agreeable.   Transportation Means: Pt reports access to transportation  Supports: No supports mentioned at this time  Samuella BruinKristin Benson Porcaro, MSW, [employer]CSWA Clinical Social Worker [facility] [phone]

## 2013-07-23 NOTE — Progress Notes (Signed)
Patient continues on 1:1. Refused her insulin coverage this morning. Stated that she was not going to take her insulin because she was not planning on eating breakfast, that she was just planning on having coffee and she did not think it would be a good idea to take her insulin at this time. CBG 169. Billy CoastGoodman, Abdias Hickam K, RN

## 2013-07-23 NOTE — [facility] Suicide Risk Assessment (Signed)
[facility] INPATIENT:  Family/Significant Other Suicide Prevention Education  Suicide Prevention Education:  Education Completed; [name] - friend [phone]),  (name of family member/significant other) has been identified by the patient as the family member/significant other with whom the patient will be residing, and identified as the person(s) who will aid the patient in the event of a mental health crisis (suicidal ideations/suicide attempt).  With written consent from the patient, the family member/significant other has been provided the following suicide prevention education, prior to the and/or following the discharge of the patient.  The suicide prevention education provided includes the following:  Suicide risk factors  Suicide prevention and interventions  National Suicide Hotline telephone number  [facility] assessment telephone number  [facility] Emergency Assistance 911  [facility] and/or Residential Mobile Crisis Unit telephone number  Request made of family/significant other to:  Remove weapons (e.g., guns, rifles, knives), all items previously/currently identified as safety concern.    Remove drugs/medications (over-the-counter, prescriptions, illicit drugs), all items previously/currently identified as a safety concern.  The family member/significant other verbalizes understanding of the suicide prevention education information provided.  The family member/significant other agrees to remove the items of safety concern listed above.  [name], [name] [name] 07/23/2013, 2:33 PM

## 2013-07-24 LAB — GLUCOSE, CAPILLARY
Glucose-Capillary: 149 mg/dL — ABNORMAL HIGH (ref 70–99)
Glucose-Capillary: 219 mg/dL — ABNORMAL HIGH (ref 70–99)
Glucose-Capillary: 155 mg/dL — ABNORMAL HIGH (ref 70–99)

## 2013-07-24 MED ORDER — BACITRACIN-NEOMYCIN-POLYMYXIN 400-5-5000 EX OINT
TOPICAL_OINTMENT | CUTANEOUS | Status: DC | PRN
  Administered 2013-07-24 – 2013-07-26 (×2): 1 via TOPICAL
  Filled 2013-07-24 (×2): qty 1

## 2013-07-24 MED ORDER — DIVALPROEX SODIUM ER 500 MG PO TB24
500.0000 mg | ORAL_TABLET | Freq: Every day | ORAL | Status: DC
  Administered 2013-07-24 – 2013-07-25 (×2): 500 mg via ORAL
  Filled 2013-07-24: qty 14
  Filled 2013-07-24 (×3): qty 1
  Filled 2013-07-24: qty 14

## 2013-07-24 NOTE — Progress Notes (Signed)
During and between groups patient was observed talking with another patient and rehearsing answers and body language to present to doctor to get off of 1:1. Every time other patient left and spoke to staff and returned to patients side they began to talk about other patient encounter and what for patient to say to get off of 1:1. Rn Educational psychologistand Charge Nurse notified.

## 2013-07-24 NOTE — Progress Notes (Signed)
[facility] Post 1:1 Observation Documentation  For the first (8) hours following discontinuation of 1:1 precautions, a progress note entry by nursing staff should be documented at least every 2 hours, reflecting the patient's behavior, condition, mood, and conversation.  Use the progress notes for additional entries.  Time 1:1 discontinued:  1105 AM  Patient's Behavior:  Patient is seen in the day room talking with peers. Patient is pleasant with this Clinical research associatewriter and staff involved in her care.  Patient's Condition:  No distress noted.  Patient's Conversation:  Patient is speaking with another patient but the conversation is not heard by this Clinical research associatewriter.  Ned ClinesDopson, Rylynn Kobs E 07/24/2013, 2:26 PM

## 2013-07-24 NOTE — Progress Notes (Signed)
Adult Psychoeducational Group Note  Date:  07/24/2013 Time:  10:00am Group Topic/Focus:  Making Healthy Choices:   The focus of this group is to help patients identify negative/unhealthy choices they were using prior to admission and identify positive/healthier coping strategies to replace them upon discharge.  Participation Level:  Active  Participation Quality:  Appropriate and Attentive  Affect:  Appropriate  Cognitive:  Alert and Appropriate  Insight: Appropriate  Engagement in Group:  Engaged  Modes of Intervention:  Discussion and Education  Additional Comments:  Pt attended and participated in group. Group discussion was on making lifestyle changes. The question was asked what change can you make to improve your life? Pt stated I am not making any changes.   Shelly BombardGarner, Seydina Holliman D 07/24/2013, 11:24 AM

## 2013-07-24 NOTE — Progress Notes (Signed)
[facility] Post 1:1 Observation Documentation  For the first (8) hours following discontinuation of 1:1 precautions, a progress note entry by nursing staff should be documented at least every 2 hours, reflecting the patient's behavior, condition, mood, and conversation.  Use the progress notes for additional entries.  Time 1:1 discontinued:  1105  Patient's Behavior:  Patient seen in the hallway. Patient just exited MD's office. Patient calm with flat affect and depressed mood.  Patient's Condition:  No distress noted. Respirations are even and unlabored.  Patient's Conversation:  No conversation at this time.  [name] E 07/24/2013, 11:11 AM

## 2013-07-24 NOTE — Progress Notes (Signed)
[facility] Post 1:1 Observation Documentation  For the first (8) hours following discontinuation of 1:1 precautions, a progress note entry by nursing staff should be documented at least every 2 hours, reflecting the patient's behavior, condition, mood, and conversation.  Use the progress notes for additional entries.  Time 1:1 discontinued:  Late entry for 07/23/13 18:00 pm  Patient's Behavior:  Calm, cooperative. She's in the dayroom conversing amongst peers.  Patient's Condition: Presents with anxious affect and mood.  Patient's Conversation: Patient voiced that she felt relieved to not have someone standing over her like a bodyguard. Also, stating she did not like having someone to be with her at all times and less freedom. Pt. Remains safe on the unit.  Harold BarbanByrd, Ronecia E 07/24/2013, 7:27 AM

## 2013-07-24 NOTE — Progress Notes (Signed)
Patient ID: [name], female   DOB: May 05, 1970, 43 y.o.   MRN: [id number]  Nursing 1:1 Note 0900-  Patient is seen in the dayroom with MHT within reach. Patient is speaking with a peer. Her affect is flat and mood is appropriate to circumstance. Patient's respirations are even and unlabored. No distress noted. 1:1 is continued for safety.

## 2013-07-24 NOTE — Progress Notes (Signed)
Patient ID: [name] BossierMichelle [name], female   DOB: 1970/12/01, 43 y.o.   MRN: [id number] D: client sitting up in bed talking to roommate and coloring. A: Writer introduced herself to client, encouraged her to verbalized any concerns. Staff will monitor 1:1 for safety. R: Pt. Is safe on the unit.

## 2013-07-24 NOTE — Progress Notes (Deleted)
[facility] :  Will you be returning to the same living situation after discharge: Yes,  can return [place], friends are supportive At discharge, do you have transportation home?:Yes,  friend will pick pt up Do you have the ability to pay for your medications:Yes,  provided pt with samples and prescriptions and pt verbalizes ability to afford meds  Release of information consent forms completed and in the chart;  Patient's signature needed at discharge.  Patient to Follow up at: Follow-up Information   Follow up with Dr. Juline Patchichard Pang On 07/30/2013. (for a follow-up with Dr. Ricki MillerPang.  Your appointment is at 10:15am)    Contact information:   [facility] [address], [place] [address] [phone]      Patient denies SI/HI:   Yes,  denies SI/HI    Safety Planning and Suicide Prevention discussed:  Yes,  discussed with pt and pt's friend.  See suicide prevention education note.   Carmina MillerHorton, Mikaylah Libbey Nicole 07/24/2013, 9:36 AM
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
## 2013-07-24 NOTE — Progress Notes (Signed)
[facility] Post 1:1 Observation Documentation  For the first (8) hours following discontinuation of 1:1 precautions, a progress note entry by nursing staff should be documented at least every 2 hours, reflecting the patient's behavior, condition, mood, and conversation.  Use the progress notes for additional entries.  Time 1:1 discontinued:  1105  Patient's Behavior:  Patient is calm, mood depressed, and affect flat. Patient is coming out of her room when Clinical research associatewriter approached.  Patient's Condition:  No distress noted. Gait is steady with no signs of pain.  Patient's Conversation:  Patient states, "the neosporin is helping." Patient shows Clinical research associatewriter her lower extremities to reveal the cuts patient reports were caused by her cat scratching her. Patient comes the medication window to receive her evening medication. Patient and writer discuss the patient's need to eat because patient reports she has not eaten in three days. However, her CBG does not reflect this. Patient was encouraged by writer to eat. Patient reports, "I will when I go home. Now that -enter a discharged patient's name- is at my house I will have to cook." Writer reported to patient that [facility] discour[facility] that or patient's exchanging phone numbers or any information. Patient verbalized understanding.   Marzetta BoardDopson, Guinn Delarosa E 07/24/2013, 5:20 PM

## 2013-07-24 NOTE — Progress Notes (Signed)
[facility] Post 1:1 Observation Documentation  For the first (8) hours following discontinuation of 1:1 precautions, a progress note entry by nursing staff should be documented at least every 2 hours, reflecting the patient's behavior, condition, mood, and conversation.  Use the progress notes for additional entries.  Time 1:1 discontinued:  1105  Patient's Behavior:  Patient is calm when speaking with two other female patients. Patient is standing in the hallway speaking to them. Patient's respirations are even and unlabored.   Patient's Condition:  No distress noted.   Patient's Conversation:  Patient is speaking with two other female patient's in the hallway. Patient is heard saying, "who is there?" Patient is speaking in low tone and stops talking when writer gets closer.  Marzetta BoardDopson, Shawan Tosh E 07/24/2013, 3:09 PM

## 2013-07-24 NOTE — Progress Notes (Signed)
[facility] Post 1:1 Observation Documentation  For the first (8) hours following discontinuation of 1:1 precautions, a progress note entry by nursing staff should be documented at least every 2 hours, reflecting the patient's behavior, condition, mood, and conversation.  Use the progress notes for additional entries.  Time 1:1 discontinued:  16:09 pm  Patient's Behavior: Pt cooperative, sullen in mood.  Pt in dayroom interacting with other patients.   Patient's Condition:  Appropriate to circumstance  Patient's Conversation:  Pt shared she is feeling better and denies SI/HI/AVH.  Pt stated she "has gotten off the 1:1 and is happy."  Pt verbally contracted for safety agreeing to seek out any staff member if she was having thoughts of harming herself and she agreed to do so.  When pt was asked about her medications, pt shared she "is not a compliant diabetic because she does not want to gain weight and her insulin makes her do so."  Pt shared with staff she has not eaten in 2 days.  Pt agreed to take her Lantus insulin at HS.  Support and encouragement given.  Pt pleasant and receptive.  [name], [name] 07/24/2013, 1:13 AM

## 2013-07-24 NOTE — Clinical Social Work Note (Signed)
CSW returned [name]'s call back, [name]'s friend/coworker [phone]).  CSW received consent to talk to [name] from [name].  [name] explained that she is concerned about [name]'s safety.  [name] explained that she doesn't think [name] is being genuine about what it actually going on.  [name] states that [name] made a comment last night that she will "finish the job" referring to suicide, when she returns home.  [name] states that she feels [name] needs additional support/follow up than her PCP.  CSW told [name] that CSW would refer this info to MD and talk with [name] again about additional resources.    Gangl IvanChelsea Horton, LCSW 07/24/2013  2:43 PM

## 2013-07-24 NOTE — Progress Notes (Signed)
[facility] Post 1:1 Observation Documentation  For the first (8) hours following discontinuation of 1:1 precautions, a progress note entry by nursing staff should be documented at least every 2 hours, reflecting the patient's behavior, condition, mood, and conversation.  Use the progress notes for additional entries.  Time 1:1 discontinued:  1105  Patient's Behavior:  Patient is calm sitting in the dayroom next to another female patient. Patient appears calm and in no distress at this time. Patient's mood is anxious and affect anxious as well.   Patient's Condition:  No distress noted. Patient's respirations even and unlabored.  Patient's Conversation:  Patient is speaking too low for writer to hear within reasonable range.  Ned ClinesDopson, Alik Mawson E 07/24/2013, 7:30 PM

## 2013-07-24 NOTE — [facility] Group Notes (Addendum)
[facility] LCSW Group Therapy  07/24/2013  1:15 PM   Type of Therapy:  Group Therapy  Participation Level:  Minimal  Participation Quality:  Minimal  Affect:  Depressed and Flat  Cognitive:  Alert and Oriented  Insight:  Limited, Lacking  Engagement in Therapy:  Limited, Lacking   Modes of Intervention:  Clarification, Confrontation, Discussion, Education, Exploration, Limit-setting, Orientation, Problem-solving, Rapport Building, Dance movement psychotherapisteality Testing, Socialization and Support  Summary of Progress/Problems: The topic for group was balance in life.  Today's group focused on defining balance in one's own words, identifying things that can knock one off balance, and exploring healthy ways to maintain balance in life. Group members were asked to provide an example of a time when they felt off balance, describe how they handled that situation,and process healthier ways to regain balance in the future. Group members were asked to share the most important tool for maintaining balance that [facility] and how they plan to apply this method after discharge.  Pt was quiet and chose not to share in group discussion but appeared to actively listen.    [name] IvanChelsea Horton, LCSW 07/24/2013  2:34 PM
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
## 2013-07-24 NOTE — Progress Notes (Signed)
Adult Psychoeducational Group Note  Date:  07/24/2013 Time:  10:47 PM  Group Topic/Focus:  Wrap-Up Group:   The focus of this group is to help patients review their daily goal of treatment and discuss progress on daily workbooks.  Participation Level:  Active  Participation Quality:  Appropriate  Affect:  Appropriate  Cognitive:  Appropriate  Insight: Appropriate  Engagement in Group:  Engaged  Modes of Intervention:  Activity  Additional Comments:  Pt attended karaoke group.  Wynema BirchCagle, Egidio Lofgren D 07/24/2013, 10:47 PM

## 2013-07-24 NOTE — [facility] Group Notes (Signed)
[facility] Group Notes:  (Nursing/MHT/Case Management/Adjunct)  Date:  07/24/2013  Time:  11:30 AM  Type of Therapy:  Nurse Education  Participation Level:  Minimal  Participation Quality:  Appropriate  Affect:  Depressed and Flat  Cognitive:  Alert  Insight:  None  Engagement in Group:  Engaged  Modes of Intervention:  Discussion and Education  Summary of Progress/Problems: Patient attended group and was engaged but did not speak during group. Patient did participate in morning stretching exercises.  Zygmund Passero E 07/24/2013, 11:30 AM
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
## 2013-07-24 NOTE — Progress Notes (Signed)
Patient ID: [name], female   DOB: Feb 05, 1970, 43 y.o.   MRN: [id number]  D: Pt. Denies SI/HI and A/V Hallucinations. Patient does not report any pain at this time. Patient reports that she has cuts on her lower extremities due to her cats. Patient was given Neosporin and this is helping her cuts on her lower extremities. Patient rates her depression and anxiety at 0/10 for the day. Patient rates that she slept well and her concentration is good.  A: Support and encouragement provided to the patient to come to writer with any questions or concerns. Writer has observed this patient more closely throughout the day due to activity on the unit that appeared inappropriate. Scheduled medications administered to patient per physician's orders.  R: Patient is receptive and cooperative but continues to flirt with men on the hall. Patient is frequently seen in the milieu and quiets her conversations when writer walks by. Patient is attending groups. Q15 minute checks are maintained for safety.

## 2013-07-25 LAB — GLUCOSE, CAPILLARY
GLUCOSE-CAPILLARY: 162 mg/dL — AB (ref 70–99)
GLUCOSE-CAPILLARY: 153 mg/dL — AB (ref 70–99)
Glucose-Capillary: 119 mg/dL — ABNORMAL HIGH (ref 70–99)
Glucose-Capillary: 77 mg/dL (ref 70–99)
Glucose-Capillary: 245 mg/dL — ABNORMAL HIGH (ref 70–99)

## 2013-07-25 MED ORDER — BACITRACIN-NEOMYCIN-POLYMYXIN 400-5-5000 EX OINT: TOPICAL_OINTMENT | CUTANEOUS | Status: DC | PRN

## 2013-07-25 MED ORDER — DIVALPROEX SODIUM ER 500 MG PO TB24: 500.0000 mg | ORAL_TABLET | Freq: Every day | ORAL | Status: DC

## 2013-07-25 MED ORDER — FAMOTIDINE 40 MG PO TABS: 40.0000 mg | ORAL_TABLET | Freq: Two times a day (BID) | ORAL | Status: DC

## 2013-07-25 MED ORDER — HYDROCHLOROTHIAZIDE 25 MG PO TABS: 25.0000 mg | ORAL_TABLET | Freq: Every day | ORAL | Status: DC

## 2013-07-25 MED ORDER — GABAPENTIN 300 MG PO CAPS: 600.0000 mg | ORAL_CAPSULE | Freq: Three times a day (TID) | ORAL | Status: DC

## 2013-07-25 MED ORDER — LISINOPRIL 10 MG PO TABS: 10.0000 mg | ORAL_TABLET | Freq: Every day | ORAL | Status: DC

## 2013-07-25 MED ORDER — ZOLPIDEM TARTRATE 5 MG PO TABS: 5.0000 mg | ORAL_TABLET | Freq: Every evening | ORAL | Status: DC | PRN

## 2013-07-25 MED ORDER — INSULIN GLARGINE 100 UNIT/ML ~~LOC~~ SOLN: 30.0000 [IU] | Freq: Every day | SUBCUTANEOUS | Status: DC

## 2013-07-25 MED ORDER — ESCITALOPRAM OXALATE 5 MG PO TABS
15.0000 mg | ORAL_TABLET | Freq: Every day | ORAL | Status: DC
  Administered 2013-07-26: 15 mg via ORAL
  Filled 2013-07-25: qty 3
  Filled 2013-07-25: qty 42

## 2013-07-25 MED ORDER — ESCITALOPRAM OXALATE 5 MG PO TABS: 15.0000 mg | ORAL_TABLET | Freq: Every day | ORAL | Status: DC

## 2013-07-25 NOTE — Progress Notes (Signed)
Patient ID: [name] BossierMichelle XXXUnsworth, female   DOB: 1970/08/26, 43 y.o.   MRN: [id number] D: Client at karaoke,interacting with peers. A: Writer observed for s/s of distress. Staff will monitor for safety q1515min as needed. R: Pt. Is safe on unit.

## 2013-07-25 NOTE — [facility] Group Notes (Signed)
[facility] Group Notes:  (Nursing/MHT/Case Management/Adjunct)  Date:  07/25/2013  Time:  11:21 AM  Type of Therapy:  Psychoeducational Skills  Participation Level:  Active  Participation Quality:  Appropriate  Affect:  Appropriate  Cognitive:  Appropriate  Insight:  Appropriate  Engagement in Group:  Engaged and Supportive  Modes of Intervention:  Discussion, Education, Exploration, Problem-solving, Socialization and Support  Summary of Progress/Problems: Pt participated and shared during the group activity.  Tania Adedams, Evangaline Jou C 07/25/2013, 11:21 AM

## 2013-07-25 NOTE — Progress Notes (Signed)
[facility] MD Progress Note  07/25/2013 1:49 PM [name]  MRN:  [id number] Subjective:  " I'm fine and I want to go home" Objective: Case discussed with treatment team and have met with patient. She minimizes any ongoing psychiatric symptoms, denies depression or sadness, and states she feels ready to go home. SW reports that patient's friend , [name], called yesterday expressing concern as patient had reported to her that her intention was to get discharged and the " finish the job" referring to suicide and that she had told her friend [name] that he could have her car , her apartment after she died. Patient denies any of this and states that " it's all lies". She did provide me with consent to speak with Lona, who essentially corroborated what she had reported to SW earlier, and expressed concern that patient's multiple scratches on legs are self inflicted, rather than scratches from her cat, as patient has stated. Patient has been visible on unit, and interacting with others, going to groups. As noted, she did have recent episode of punching wall and a prior suicidal gesture of cutting wrist earlier during this admission. Patient denies medication side effects Diagnosis:  MDD   Total Time spent with patient: 20 minutes    ADL's: improved   Sleep: Fair  Appetite:  Good   Suicidal Ideation:  As noted, patient denies any suicidal ideations, but friend reports to staff she has made suicidal threats/remarks via phone  Homicidal Ideation:  Denies  AEB (as evidenced by):  Psychiatric Specialty Exam: Physical Exam  Review of Systems  Respiratory: Negative for shortness of breath.   Cardiovascular: Negative for chest pain.  Skin: Negative for rash.  Psychiatric/Behavioral: Positive for depression. The patient is nervous/anxious.     Blood pressure 92/54, pulse 104, temperature 98 F (36.7 C), temperature source Oral, resp. rate 18, height '5\' 3"'  (1.6 m), weight 90.719 kg (200 lb), last  menstrual period 07/05/2013.Body mass index is 35.44 kg/(m^2).  General Appearance: Well Groomed  Engineer, water::  Good  Speech:  Normal Rate  Volume:  Normal  Mood:  Depressed- patient states she is feeling better and denies depression at this time  Affect:  Labile  Thought Process:  Goal Directed  Orientation:  NA- fully alert and attentive  Thought Content:  denies hallucinations, no delusions  Suicidal Thoughts:  No- patient denies any SI- see above for report from collateral information source  Homicidal Thoughts:  No  Memory:  NA  Judgement:  Fair  Insight:  Fair  Psychomotor Activity:  Normal  Concentration:  Good  Recall:  Good  Fund of Knowledge:Good  Language: Good  Akathisia:  Negative  Handed:  Right  AIMS (if indicated):     Assets:  Communication Skills Resilience  Sleep:  Number of Hours: 4   Musculoskeletal: Strength & Muscle Tone: within normal limits Gait & Station: normal Patient leans: N/A  Current Medications: Current Facility-Administered Medications  Medication Dose Route Frequency Provider Last Rate Last Dose  . alum & mag hydroxide-simeth (MAALOX/MYLANTA) 200-200-20 MG/5ML suspension 30 mL  30 mL Oral Q4H PRN Dara Hoyer, PA-C      . aspirin chewable tablet 81 mg  81 mg Oral Daily Encarnacion Slates, NP   81 mg at 07/25/13 0800  . divalproex (DEPAKOTE ER) 24 hr tablet 500 mg  500 mg Oral QHS Neita Garnet, MD   500 mg at 07/24/13 2159  . escitalopram (LEXAPRO) tablet 15 mg  15 mg Oral Daily  Neita Garnet, MD      . famotidine (PEPCID) tablet 40 mg  40 mg Oral BID Encarnacion Slates, NP   40 mg at 07/25/13 0800  . gabapentin (NEURONTIN) capsule 600 mg  600 mg Oral TID Neita Garnet, MD   600 mg at 07/25/13 1209  . hydrochlorothiazide (HYDRODIURIL) tablet 25 mg  25 mg Oral Daily Encarnacion Slates, NP   25 mg at 07/25/13 0800  . ibuprofen (ADVIL,MOTRIN) tablet 400 mg  400 mg Oral Q6H PRN Encarnacion Slates, NP   400 mg at 07/23/13 2108  . insulin aspart (novoLOG)  injection 0-20 Units  0-20 Units Subcutaneous TID WC Dara Hoyer, PA-C   4 Units at 07/24/13 1710  . insulin glargine (LANTUS) injection 30 Units  30 Units Subcutaneous QHS Dara Hoyer, PA-C   30 Units at 07/24/13 2200  . lisinopril (PRINIVIL,ZESTRIL) tablet 10 mg  10 mg Oral Daily Dara Hoyer, PA-C   10 mg at 07/24/13 0932  . magnesium hydroxide (MILK OF MAGNESIA) suspension 30 mL  30 mL Oral Daily PRN Dara Hoyer, PA-C      . neomycin-bacitracin-polymyxin (NEOSPORIN) ointment   Topical PRN Neita Garnet, MD   1 application at 67/12/45 1541  . zolpidem (AMBIEN) tablet 5 mg  5 mg Oral QHS PRN Neita Garnet, MD   5 mg at 07/23/13 2108    Lab Results:  Results for orders placed during the hospital encounter of 07/19/13 (from the past 48 hour(s))  GLUCOSE, CAPILLARY     Status: Abnormal   Collection Time    07/23/13  4:57 PM      Result Value Ref Range   Glucose-Capillary 250 (*) 70 - 99 mg/dL   Comment 1 Notify RN    GLUCOSE, CAPILLARY     Status: Abnormal   Collection Time    07/23/13  8:39 PM      Result Value Ref Range   Glucose-Capillary 226 (*) 70 - 99 mg/dL  GLUCOSE, CAPILLARY     Status: Abnormal   Collection Time    07/24/13  6:06 AM      Result Value Ref Range   Glucose-Capillary 149 (*) 70 - 99 mg/dL  GLUCOSE, CAPILLARY     Status: Abnormal   Collection Time    07/24/13 11:38 AM      Result Value Ref Range   Glucose-Capillary 219 (*) 70 - 99 mg/dL   Comment 1 Notify RN    GLUCOSE, CAPILLARY     Status: Abnormal   Collection Time    07/24/13  4:32 PM      Result Value Ref Range   Glucose-Capillary 155 (*) 70 - 99 mg/dL   Comment 1 Notify RN    GLUCOSE, CAPILLARY     Status: Abnormal   Collection Time    07/24/13 10:05 PM      Result Value Ref Range   Glucose-Capillary 119 (*) 70 - 99 mg/dL  GLUCOSE, CAPILLARY     Status: None   Collection Time    07/25/13  6:14 AM      Result Value Ref Range   Glucose-Capillary 77  70 - 99 mg/dL  GLUCOSE,  CAPILLARY     Status: Abnormal   Collection Time    07/25/13 11:29 AM      Result Value Ref Range   Glucose-Capillary 162 (*) 70 - 99 mg/dL    Physical Findings: AIMS: Facial and Oral Movements Muscles of Facial Expression: None,  normal Lips and Perioral Area: None, normal Jaw: None, normal Tongue: None, normal,Extremity Movements Upper (arms, wrists, hands, fingers): None, normal Lower (legs, knees, ankles, toes): None, normal, Trunk Movements Neck, shoulders, hips: None, normal, Overall Severity Severity of abnormal movements (highest score from questions above): None, normal Incapacitation due to abnormal movements: None, normal Patient's awareness of abnormal movements (rate only patient's report): No Awareness, Dental Status Current problems with teeth and/or dentures?: No Does patient usually wear dentures?: No  CIWA:  CIWA-Ar Total: 1 COWS:  COWS Total Score: 1  Assessment: Patient reports feeling much better and is intent on discharge. Her friend [name] has contacted staff, expressing concern that patient stated via phone her intention was to " nish the job" and complete suicide after returning home, which patient denies having said. Friend also states she has told her friends they can have her car and apartment when she dies. This, along with recent impulsivity as demonstrated by punching wall, cutting self, and need for one to one observation for safety at times, in my opinion warrants ongoing inpatient treatment. I have discussed with treatment team and they agree.  Treatment Plan Summary: Daily contact with patient to assess and evaluate symptoms and progress in treatment Medication management See below  Plan: Involuntary commitment has been processed/filed as per above concern. Continue LEXAPRO 15 mgrs Daily and she is now on Depakote ER 500 mgrs QHS.  Medical Decision Making Problem Points:  Established problem, worsening (2) Data Points:  Review of medication  regiment & side effects (2)  I certify that inpatient services furnished can reasonably be expected to improve the patient's condition.   Carrieanne Kleen 07/25/2013, 1:49 PM

## 2013-07-25 NOTE — [facility] Group Notes (Signed)
[facility]  07/25/2013 8:45 AM  Participation Quality: Alert, Appropriate and Oriented  Mood/Affect: "great" mood; flat affect  Depression Rating: 0  Anxiety Rating: 0  Thoughts of Suicide: Pt denies SI/HI  Will you contract for safety? Yes  Current AVH: Pt denies  Plan for Discharge/Comments: Pt attended discharge planning group and actively participated in group. CSW provided pt with today's workbook. Pt plans to return home to her apartment and follow-up with her PCP for a psychiatric referral.  Pt's goal for today is to be discharged.  Transportation Means: Pt reports access to transportation  Supports: No supports mentioned at this time  Chad CordialLauren Carter, LCSWA 07/25/2013 12:12 PM
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
## 2013-07-25 NOTE — Progress Notes (Signed)
Patient ID: [name], female   DOB: 1970/01/06, 43 y.o.   MRN: [id number] [place] [facility] [place] [facility]  CSW confirmed with magistrate's office that the paperwork was received.  Chad CordialLauren Carter, LCSWA 07/25/2013 2:58 PM

## 2013-07-25 NOTE — Treatment Plan (Signed)
Pt's "husband," Ed BlalockJoseph Unsworth [facility].  Told him that wasn't possible at the moment.  He left his phone number [phone] and would like a call from the MD.

## 2013-07-25 NOTE — Progress Notes (Signed)
Nursing Progress Note : D-  Patients presents with flirtatious affect,speech is hyverbal. " I'm feeling much better and I'm ready to go" Goal for today is to work on discharged plans.  Pt.refused afternoon insulin B.S was 162. " I won't be eating until I'm discharged , so there's no reason to take it."  A- Support and Encouragement provided, Allowed patient to ventilate during 1:1.  R- Will continue to monitor on q 15 minute checks for safety, compliant with medications and programing . Educated pt. on Lisinopril .

## 2013-07-25 NOTE — Progress Notes (Signed)
Adult Psychoeducational Group Note  Date:  07/25/2013 Time:  9:53 PM  Group Topic/Focus:  Wrap-Up Group:   The focus of this group is to help patients review their daily goal of treatment and discuss progress on daily workbooks.  Participation Level:  Active  Participation Quality:  Appropriate, Attentive, Sharing and Supportive  Affect:  Appropriate  Cognitive:  Alert, Appropriate and Oriented  Insight: Appropriate and Good  Engagement in Group:  Engaged and Supportive  Modes of Intervention:  Discussion, Education, Socialization and Support  Additional Comments:  Pt attended and participated in group.  Pt shared that that she had a pretty bad day.  Pt was upset that the doctor had her committed even though she feels she should not be here.  Pt finished by saying she is happy to have met some good people to help her through her stay here.  Milus Glazier 07/25/2013, 9:53 PM

## 2013-07-26 DIAGNOSIS — F329 Major depressive disorder, single episode, unspecified: Principal | ICD-10-CM

## 2013-07-26 LAB — GLUCOSE, CAPILLARY
GLUCOSE-CAPILLARY: 245 mg/dL — AB (ref 70–99)
Glucose-Capillary: 141 mg/dL — ABNORMAL HIGH (ref 70–99)
Glucose-Capillary: 297 mg/dL — ABNORMAL HIGH (ref 70–99)
Glucose-Capillary: 210 mg/dL — ABNORMAL HIGH (ref 70–99)

## 2013-07-26 MED ORDER — TRAZODONE HCL 50 MG PO TABS
50.0000 mg | ORAL_TABLET | Freq: Every day | ORAL | Status: DC
  Administered 2013-07-26 – 2013-07-27 (×2): 50 mg via ORAL
  Filled 2013-07-26 (×4): qty 1

## 2013-07-26 MED ORDER — LOPERAMIDE HCL 2 MG PO CAPS
2.0000 mg | ORAL_CAPSULE | ORAL | Status: AC | PRN
  Administered 2013-07-26 (×2): 2 mg via ORAL
  Filled 2013-07-26 (×2): qty 1

## 2013-07-26 MED ORDER — DIVALPROEX SODIUM ER 500 MG PO TB24
500.0000 mg | ORAL_TABLET | Freq: Two times a day (BID) | ORAL | Status: DC
  Administered 2013-07-26 – 2013-07-28 (×4): 500 mg via ORAL
  Filled 2013-07-26 (×7): qty 1

## 2013-07-26 NOTE — Progress Notes (Signed)
Patient ID: [name], female   DOB: Dec 14, 1970, 43 y.o.   MR[facility]: [id number] [facility]  07/26/2013 2:42 PM [name]  MRN:  [id number] Subjective: "I'm fine." "the same as when I got here." "I overdosed on some medication," but then says "I don't feel like that anymore." Patient states she acted unlike herself and has already called her parents and family friends to apologize for the stress and anxiety she put them through.  Objective: Patient is alert and oriented and cooperative. She is pleasant and informative. States she can confide in her ex-husband. She states they are still friends, but he is insensitive. She does not really have anybody to confide in about her issues. She does admit that she has been in denial for years about her diabetes to the point now she is having laser ablation of retinopathy every 6 months or so, has peripheral neuropathy due to her poorly controlled blood sugars.     She was adamant about her Ambien but did admit that she was waking up and eating during the night with no recall of this, as well as ordering items off of her [employer] which would arrive at her office.  Diagnosis:  MDD   Total Time spent with patient: 20 minutes ADL's: improved   Sleep: poor  Appetite: poor also continues to make poor food choices  Suicidal Ideation:  Denies SI/intent, or means Homicidal Ideation:  Denies  AEB (as evidenced by):  Psychiatric Specialty Exam: Physical Exam  Review of Systems  Respiratory: Negative for shortness of breath.   Cardiovascular: Negative for chest pain.  Skin: Negative for rash.  Psychiatric/Behavioral: Positive for depression. The patient is nervous/anxious.     Blood pressure 128/68, pulse 79, temperature 98.2 F (36.8 C), temperature source Oral, resp. rate 20, height 5\' 3"  (1.6 m), weight 90.719 kg (200 lb), last menstrual period 07/05/2013.Body mass index is 35.44 kg/(m^2).  General Appearance: Well Groomed  Proofreaderye  Contact::  Good  Speech:  Normal Rate  Volume:  Normal  Mood:  Depressed- patient states she is feeling better and denies depression at this time  Affect:   appropriate  Thought Process:  Goal Directed  Orientation:  NA- fully alert and attentive  Thought Content:  denies hallucinations, no delusions  Suicidal Thoughts:  No- patient denies any SI- see above for report from collateral information source  Homicidal Thoughts:  No  Memory:  NA  Judgement:  Fair  Insight:  Fair  Psychomotor Activity:  Normal  Concentration:  Good  Recall:  Good  Fund of Knowledge:Good  Language: Good  Akathisia:  Negative  Handed:  Right  AIMS (if indicated):     Assets:  Communication Skills Resilience  Sleep:  Number of Hours: 4.5   Musculoskeletal: Strength & Muscle Tone: within normal limits Gait & Station: normal Patient leans: N/A  Current Medications: Current Facility-Administered Medications  Medication Dose Route Frequency Provider Last Rate Last Dose  . alum & mag hydroxide-simeth (MAALOX/MYLANTA) 200-200-20 MG/5ML suspension 30 mL  30 mL Oral Q4H PRN Court Joyharles E Kober, PA-C      . aspirin chewable tablet 81 mg  81 mg Oral Daily Sanjuana KavaAgnes I Nwoko, NP   81 mg at 07/26/13 0836  . divalproex (DEPAKOTE ER) 24 hr tablet 500 mg  500 mg Oral QHS Nehemiah MassedFernando Cobos, MD   500 mg at 07/25/13 2127  . escitalopram (LEXAPRO) tablet 15 mg  15 mg Oral Daily Nehemiah MassedFernando Cobos, MD   15 mg at  07/26/13 0925  . famotidine (PEPCID) tablet 40 mg  40 mg Oral BID Sanjuana Kava, NP   40 mg at 07/26/13 0836  . gabapentin (NEURONTIN) capsule 600 mg  600 mg Oral TID Nehemiah Massed, MD   600 mg at 07/26/13 1204  . hydrochlorothiazide (HYDRODIURIL) tablet 25 mg  25 mg Oral Daily Sanjuana Kava, NP   25 mg at 07/26/13 0836  . ibuprofen (ADVIL,MOTRIN) tablet 400 mg  400 mg Oral Q6H PRN Sanjuana Kava, NP   400 mg at 07/23/13 2108  . insulin aspart (novoLOG) injection 0-20 Units  0-20 Units Subcutaneous TID WC Court Joy, PA-C    7 Units at 07/26/13 1203  . insulin glargine (LANTUS) injection 30 Units  30 Units Subcutaneous QHS Court Joy, PA-C   30 Units at 07/25/13 2129  . lisinopril (PRINIVIL,ZESTRIL) tablet 10 mg  10 mg Oral Daily Court Joy, PA-C   10 mg at 07/26/13 0836  . loperamide (IMODIUM) capsule 2 mg  2 mg Oral PRN Kristeen Mans, NP   2 mg at 07/26/13 0454  . magnesium hydroxide (MILK OF MAGNESIA) suspension 30 mL  30 mL Oral Daily PRN Court Joy, PA-C      . neomycin-bacitracin-polymyxin (NEOSPORIN) ointment   Topical PRN Nehemiah Massed, MD   1 application at 07/26/13 1610  . zolpidem (AMBIEN) tablet 5 mg  5 mg Oral QHS PRN Nehemiah Massed, MD   5 mg at 07/23/13 2108    Lab Results:  Results for orders placed during the hospital encounter of 07/19/13 (from the past 48 hour(s))  GLUCOSE, CAPILLARY     Status: Abnormal   Collection Time    07/24/13  4:32 PM      Result Value Ref Range   Glucose-Capillary 155 (*) 70 - 99 mg/dL   Comment 1 Notify RN    GLUCOSE, CAPILLARY     Status: Abnormal   Collection Time    07/24/13 10:05 PM      Result Value Ref Range   Glucose-Capillary 119 (*) 70 - 99 mg/dL  GLUCOSE, CAPILLARY     Status: None   Collection Time    07/25/13  6:14 AM      Result Value Ref Range   Glucose-Capillary 77  70 - 99 mg/dL  GLUCOSE, CAPILLARY     Status: Abnormal   Collection Time    07/25/13 11:29 AM      Result Value Ref Range   Glucose-Capillary 162 (*) 70 - 99 mg/dL  GLUCOSE, CAPILLARY     Status: Abnormal   Collection Time    07/25/13  5:18 PM      Result Value Ref Range   Glucose-Capillary 153 (*) 70 - 99 mg/dL  GLUCOSE, CAPILLARY     Status: Abnormal   Collection Time    07/25/13  9:19 PM      Result Value Ref Range   Glucose-Capillary 245 (*) 70 - 99 mg/dL  GLUCOSE, CAPILLARY     Status: Abnormal   Collection Time    07/26/13  6:10 AM      Result Value Ref Range   Glucose-Capillary 141 (*) 70 - 99 mg/dL    Physical Findings: AIMS: Facial and Oral  Movements Muscles of Facial Expression: None, normal Lips and Perioral Area: None, normal Jaw: None, normal Tongue: None, normal,Extremity Movements Upper (arms, wrists, hands, fingers): None, normal Lower (legs, knees, ankles, toes): None, normal, Trunk Movements Neck, shoulders, hips: None, normal,  Overall Severity Severity of abnormal movements (highest score from questions above): None, normal Incapacitation due to abnormal movements: None, normal Patient's awareness of abnormal movements (rate only patient's report): No Awareness, Dental Status Current problems with teeth and/or dentures?: No Does patient usually wear dentures?: No  CIWA:  CIWA-Ar Total: 1 COWS:  COWS Total Score: 1  Assessment:  1. Patient educated about the possibility of oversedation due to Ambien as well as it's negative effects on her poorly controlled blood sugars. She does agree, that there are better medications and it is discontinued.  Trazodone will be ordered for her. 2. Patient is also educated about mood instability and depression and is open to discussing this with Dr. Jama Flavors on Monday regarding a change to a different antidepressant. 3. She does agree to an increase in her depakote for further mood stabilization. Risks benefits and side effects are discussed. Treatment Plan Summary: Daily contact with patient to assess and evaluate symptoms and progress in treatment Medication management See below  Plan: Involuntary commitment has been processed/filed as per above concern. 1.D/C Ambien. 2. Added Trazodone 50mg  po at hs. For insomnia. 3. Increase Depakote ER to 500mg  BID to start in the AM. 4. Will follow .  Medical Decision Making Problem Points:  Established problem, worsening (2) Data Points:  Review of medication regiment & side effects (2)  I certify that inpatient services furnished can reasonably be expected to improve the patient's condition.   Rona Ravens. [place] RPAC 3:49  PM 07/26/2013 I agreed with findings and treatment plan of this patient

## 2013-07-26 NOTE — Progress Notes (Signed)
[facility] Group Notes:  (Nursing/MHT/Case Management/Adjunct)  Date:  07/26/2013  Time:  11:23 PM  Type of Therapy:  Psychoeducational Skills  Participation Level:  Minimal  Participation Quality:  Resistant  Affect:  Flat  Cognitive:  Appropriate  Insight:  Improving  Engagement in Group:  Resistant  Modes of Intervention:  Education  Summary of Progress/Problems: The patient had little to share at group at the outset, but became more spontaneous after her peers encouraged her. She states that she colored today as a means of passing the time. In addition, she briefly mentioned that she ate more today in the cafeteria as compared to other days. As a theme for the day, she indicated that she does not have a support system and will serve as her own support following discharge.   Hazle CocaGOODMAN, Pratham Cassatt S 07/26/2013, 11:23 PM

## 2013-07-26 NOTE — Progress Notes (Signed)
.  Psychoeducational Group Note    Date: 07/26/2013 Time:  0930    Goal Setting Purpose of Group: To be able to set a goal that is measurable and that can be accomplished in one day  Participation Level:  Active  Participation Quality:  Appropriate  Affect:  Flat  Cognitive:  Oriented  Insight:  Improving  Engagement in Group:  Engaged  Additional Comments:  Attended the group and participated in the discussion.  Javaun Dimperio A 

## 2013-07-26 NOTE — Progress Notes (Addendum)
D Pt is OOb UAL on the 500 hall...she attends her groups and is  engaged in her Life SKills group today as evidenced by her asking questions in the group, sharing her experiences with the group.   A There has been no talk or pt involved conversation today, critisizing MD for commiting her to hospital, as opposed to yesterday. SHe has been appropriate in her behavior and her conversation. She also has been eating and taking ALL of her medication.Marland Kitchen.as ordered,, including insulin. Positive feedback given frequently to pt today ( in  Lieu of pt's refusal to take insulin all yesterday).   R Safety is in place and poc moves forward.

## 2013-07-26 NOTE — [facility] Group Notes (Signed)
[facility] Group Notes:  (Clinical Social Work)  07/26/2013   1:15-2:15PM  Summary of Progress/Problems:   The main focus of today's process group was for the patient to identify ways in which they have sabotaged their own mental health wellness/recovery.  Motivational interviewing and a handout were used to explore the benefits and costs of their self-sabotaging behavior as well as the benefits and costs of changing this behavior.  The Stages of Change were explained to the group using a handout, and patients identified where they are with regard to changing self-defeating behaviors.  The patient expressed she self-sabotages with staying in unhealthy relationships because it is easier than getting out of them.  She states she knows immediately when meeting someone whether it is going to be healthy or unhealthy for her.  She stated if she could make a decision to get out of unhealthy relationships, and only accept healthy ones, she would be happier.  Type of Therapy:  Process Group  Participation Level:  Active  Participation Quality:  Attentive and Sharing  Affect:  Appropriate  Cognitive:  Appropriate and Oriented  Insight:  Engaged  Engagement in Therapy:  Engaged  Modes of Intervention:  Education, Motivational Interviewing   Ambrose MantleMareida Grossman-Orr, LCSW 07/26/2013, 4:00pm

## 2013-07-26 NOTE — Progress Notes (Signed)
Psychoeducational Group Note  Date: 07/26/2013 Time:  1015  Group Topic/Focus:  Identifying Needs:   The focus of this group is to help patients identify their personal needs that have been historically problematic and identify healthy behaviors to address their needs.  Participation Level:  Active  Participation Quality:  Appropriate  Affect:  Appropriate  Cognitive:  Oriented  Insight:  Improving  Engagement in Group:  Engaged  Additional Comments:  Pt attended the group. Quiet at first and then was able to open up and participate.   Jahnavi Muratore A 
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
## 2013-07-27 DIAGNOSIS — IMO0002 Reserved for concepts with insufficient information to code with codable children: Secondary | ICD-10-CM

## 2013-07-27 DIAGNOSIS — E119 Type 2 diabetes mellitus without complications: Secondary | ICD-10-CM

## 2013-07-27 DIAGNOSIS — Z794 Long term (current) use of insulin: Secondary | ICD-10-CM

## 2013-07-27 DIAGNOSIS — E139 Other specified diabetes mellitus without complications: Secondary | ICD-10-CM | POA: Insufficient documentation

## 2013-07-27 LAB — VALPROIC ACID LEVEL: VALPROIC ACID LVL: 46.5 ug/mL — AB (ref 50.0–100.0)

## 2013-07-27 LAB — GLUCOSE, CAPILLARY
GLUCOSE-CAPILLARY: 197 mg/dL — AB (ref 70–99)
GLUCOSE-CAPILLARY: 259 mg/dL — AB (ref 70–99)
Glucose-Capillary: 268 mg/dL — ABNORMAL HIGH (ref 70–99)
Glucose-Capillary: 202 mg/dL — ABNORMAL HIGH (ref 70–99)

## 2013-07-27 MED ORDER — INSULIN GLARGINE 100 UNIT/ML ~~LOC~~ SOLN
32.0000 [IU] | Freq: Every day | SUBCUTANEOUS | Status: DC
  Administered 2013-07-27: 32 [IU] via SUBCUTANEOUS
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
## 2013-07-27 NOTE — Progress Notes (Signed)
Psychoeducational Group Note  Date: 07/27/2013 Time: 1015  Group Topic/Focus:  Making Healthy Choices:   The focus of this group is to help patients identify negative/unhealthy choices they were using prior to admission and identify positive/healthier coping strategies to replace them upon discharge.  Participation Level:  Active  Participation Quality:  Attentive  Affect:  Appropriate  Cognitive:  Appropriate  Insight:  Engaged  Engagement in Group:  Engaged  Additional Comments:    07/27/2013,10:42 AM Kateri Mcuke, Joie BimlerPatricia Lynn

## 2013-07-27 NOTE — Progress Notes (Signed)
Patient ID: [name], female   DOB: Mar 21, 1970, 43 y.o.   MRN: [id number] [facility] MD Progress Note  07/27/2013 3:04 PM [name]  MRN:  [id number] Subjective: Patient notes that she feels that she is in a better mood today. She says she didn't sleep well because she has lots of things on her mind. She is not reporting racing thoughts but is considering her home, her job, her parents, life when she leaves here. She is taking her medication as noted and attending groups.She is very anxious to get home and is concerned MD is not going to discharge her tomorrow.  Objective: Patient has been up and active in the unit milieu over this weekend. She has been appropriately behaved and cooperative with staff and respectfull of peers. She has not demonstrated any behaviors to cause this provider concern for her safety or the safety of others. The patient was given the opportunity to voice concerns to [facility] on call regarding communication here on the unit, and is also encouraged to address any changes to the patient satisfaction survey.   Diagnosis:  MDD   Total Time spent with patient: 20 minutes ADL's: improved   Sleep: poor  Appetite: poor also continues to make poor food choices  Suicidal Ideation:  Denies SI/intent, or means Homicidal Ideation:  Denies  AEB (as evidenced by):  Psychiatric Specialty Exam: Physical Exam  Review of Systems  Respiratory: Negative for shortness of breath.   Cardiovascular: Negative for chest pain.  Skin: Negative for rash.  Psychiatric/Behavioral: Positive for depression. The patient is nervous/anxious.     Blood pressure 107/68, pulse 41, temperature 98.1 F (36.7 C), temperature source Oral, resp. rate 16, height 5\' 3"  (1.6 m), weight 90.719 kg (200 lb), last menstrual period 07/05/2013.Body mass index is 35.44 kg/(m^2).  General Appearance: Well Groomed  Patent attorney::  Good  Speech:  Normal Rate  Volume:  Normal  Mood:  Depressed- patient  states she is feeling better and denies depression at this time  Affect:   appropriate  Thought Process:  Goal Directed  Orientation:  NA- fully alert and attentive  Thought Content:  denies hallucinations, no delusions  Suicidal Thoughts:  No- patient denies any SI- see above for report from collateral information source  Homicidal Thoughts:  No  Memory:  NA  Judgement:  Fair  Insight:  Fair  Psychomotor Activity:  Normal  Concentration:  Good  Recall:  Good  Fund of Knowledge:Good  Language: Good  Akathisia:  Negative  Handed:  Right  AIMS (if indicated):     Assets:  Communication Skills Resilience  Sleep:  Number of Hours: 4.75   Musculoskeletal: Strength & Muscle Tone: within normal limits Gait & Station: normal Patient leans: N/A  Current Medications: Current Facility-Administered Medications  Medication Dose Route Frequency Provider Last Rate Last Dose  . alum & mag hydroxide-simeth (MAALOX/MYLANTA) 200-200-20 MG/5ML suspension 30 mL  30 mL Oral Q4H PRN Court Joy, PA-C      . aspirin chewable tablet 81 mg  81 mg Oral Daily Sanjuana Kava, NP   81 mg at 07/26/13 0836  . divalproex (DEPAKOTE ER) 24 hr tablet 500 mg  500 mg Oral QHS Nehemiah Massed, MD   500 mg at 07/25/13 2127  . escitalopram (LEXAPRO) tablet 15 mg  15 mg Oral Daily Nehemiah Massed, MD   15 mg at 07/26/13 0925  . famotidine (PEPCID) tablet 40 mg  40 mg Oral BID Sanjuana Kava, NP  40 mg at 07/26/13 0836  . gabapentin (NEURONTIN) capsule 600 mg  600 mg Oral TID Nehemiah MassedFernando Cobos, MD   600 mg at 07/26/13 1204  . hydrochlorothiazide (HYDRODIURIL) tablet 25 mg  25 mg Oral Daily Sanjuana KavaAgnes I Nwoko, NP   25 mg at 07/26/13 0836  . ibuprofen (ADVIL,MOTRIN) tablet 400 mg  400 mg Oral Q6H PRN Sanjuana KavaAgnes I Nwoko, NP   400 mg at 07/23/13 2108  . insulin aspart (novoLOG) injection 0-20 Units  0-20 Units Subcutaneous TID WC Court Joyharles E Kober, PA-C   7 Units at 07/26/13 1203  . insulin glargine (LANTUS) injection 30 Units  30 Units  Subcutaneous QHS Court Joyharles E Kober, PA-C   30 Units at 07/25/13 2129  . lisinopril (PRINIVIL,ZESTRIL) tablet 10 mg  10 mg Oral Daily Court Joyharles E Kober, PA-C   10 mg at 07/26/13 0836  . loperamide (IMODIUM) capsule 2 mg  2 mg Oral PRN Kristeen MansFran E Hobson, NP   2 mg at 07/26/13 0454  . magnesium hydroxide (MILK OF MAGNESIA) suspension 30 mL  30 mL Oral Daily PRN Court Joyharles E Kober, PA-C      . neomycin-bacitracin-polymyxin (NEOSPORIN) ointment   Topical PRN Nehemiah MassedFernando Cobos, MD   1 application at 07/26/13 [id number]  . zolpidem (AMBIEN) tablet 5 mg  5 mg Oral QHS PRN Nehemiah MassedFernando Cobos, MD   5 mg at 07/23/13 2108    Lab Results:  Results for orders placed during the hospital encounter of 07/19/13 (from the past 48 hour(s))  GLUCOSE, CAPILLARY     Status: Abnormal   Collection Time    07/24/13  4:32 PM      Result Value Ref Range   Glucose-Capillary 155 (*) 70 - 99 mg/dL   Comment 1 Notify RN    GLUCOSE, CAPILLARY     Status: Abnormal   Collection Time    07/24/13 10:05 PM      Result Value Ref Range   Glucose-Capillary 119 (*) 70 - 99 mg/dL  GLUCOSE, CAPILLARY     Status: None   Collection Time    07/25/13  6:14 AM      Result Value Ref Range   Glucose-Capillary 77  70 - 99 mg/dL  GLUCOSE, CAPILLARY     Status: Abnormal   Collection Time    07/25/13 11:29 AM      Result Value Ref Range   Glucose-Capillary 162 (*) 70 - 99 mg/dL  GLUCOSE, CAPILLARY     Status: Abnormal   Collection Time    07/25/13  5:18 PM      Result Value Ref Range   Glucose-Capillary 153 (*) 70 - 99 mg/dL  GLUCOSE, CAPILLARY     Status: Abnormal   Collection Time    07/25/13  9:19 PM      Result Value Ref Range   Glucose-Capillary 245 (*) 70 - 99 mg/dL  GLUCOSE, CAPILLARY     Status: Abnormal   Collection Time    07/26/13  6:10 AM      Result Value Ref Range   Glucose-Capillary 141 (*) 70 - 99 mg/dL    Physical Findings: AIMS: Facial and Oral Movements Muscles of Facial Expression: None, normal Lips and Perioral Area: None,  normal Jaw: None, normal Tongue: None, normal,Extremity Movements Upper (arms, wrists, hands, fingers): None, normal Lower (legs, knees, ankles, toes): None, normal, Trunk Movements Neck, shoulders, hips: None, normal, Overall Severity Severity of abnormal movements (highest score from questions above): None, normal Incapacitation due to abnormal movements: None, normal  Patient's awareness of abnormal movements (rate only patient's report): No Awareness, Dental Status Current problems with teeth and/or dentures?: No Does patient usually wear dentures?: No  CIWA:  CIWA-Ar Total: 1 COWS:  COWS Total Score: 1  Assessment:  1. Patient continues to sleep poorly. 2. Small changes are made to her insulin regimen as noted, increased 2 units of Lantus at hs. 3. Added QID and HS CBGs for better control. 4. Anticipate discharge home tomorrow if no complications.   Treatment Plan Summary: Daily contact with patient to assess and evaluate symptoms and progress in treatment Medication management See below  Plan: Involuntary commitment has been processed/filed as per above concern. 1. Will follow . 2. Small changes are made to her insulin regimen as noted, increased 2 units of Lantus at hs. 3. Added QID and HS CBGs for better control. 4. Anticipate discharge home tomorrow if no complications.  Medical Decision Making Problem Points:  Established problem, worsening (2) Data Points:  Review of medication regiment & side effects (2)  I certify that inpatient services furnished can reasonably be expected to improve the patient's condition.   Rona Ravens. Mashburn RPAC 3:04 PM 07/27/2013 I agreed with findings and treatment plan of this patient

## 2013-07-27 NOTE — Progress Notes (Signed)
Patient cheerful and cooperative. Complained of diarrhea. Requested and received po prn of imodium 2 mg and was effective. Patient denied pain, SI, AH/VH. Medication given as prescribed. Every 15 minutes check. Will continue to monitor patient.

## 2013-07-27 NOTE — Progress Notes (Signed)
Psychoeducational Group Note  Date:  07/27/2013 Time: 1015 Group Topic/Focus:  Making Healthy Choices:   The focus of this group is to help patients identify negative/unhealthy choices they were using prior to admission and identify positive/healthier coping strategies to replace them upon discharge.  Participation Level:  Active  Participation Quality:  Appropriate  Affect:  Anxious  Cognitive:  Alert  Insight:  Engaged  Engagement in Group:  Engaged  Additional Comments:    Rich Braveuke, Sheelah Ritacco Lynn 4:00 PM. 07/27/2013

## 2013-07-27 NOTE — Progress Notes (Signed)
D: Pt presents with appropriate affect, pleasant mood. Denies SI/HI.  Denies AH/VH.  When asked what her goal for the day was, pt stated "I didn't have one.  It's Sunday.  Hopefully I can leave soon though.  I've been here so long I feel like the queen of the 500 hall."   A: Medications administered per order.  Encouragement and support provided.   R: Pt attended evening group.  Pt interacting with peers and staff appropriately.  Pt is in no acute distress.  Will continue to monitor for safety.

## 2013-07-27 NOTE — [facility] Group Notes (Signed)
[facility] LCSW Group Therapy  07/27/2013 3:58 PM  Type of Therapy:  Group Therapy  Participation Level:  Did Not Attend  Participation Quality:  N/A  Affect:  N/A  Cognitive:  N/A  Insight:  N/A  Engagement in Therapy:  N/A  Modes of Intervention:  Discussion, Education, Exploration, Rapport Building, Socialization and Support  Summary of Progress/Problems: Pt did not attend.   Seabron SpatesVaughn, Ravon Mortellaro Anne 07/27/2013, 3:58 PM

## 2013-07-28 DIAGNOSIS — F329 Major depressive disorder, single episode, unspecified: Secondary | ICD-10-CM

## 2013-07-28 DIAGNOSIS — F3289 Other specified depressive episodes: Secondary | ICD-10-CM

## 2013-07-28 LAB — GLUCOSE, CAPILLARY
Glucose-Capillary: 202 mg/dL — ABNORMAL HIGH (ref 70–99)
Glucose-Capillary: 260 mg/dL — ABNORMAL HIGH (ref 70–99)

## 2013-07-28 MED ORDER — ESCITALOPRAM OXALATE 5 MG PO TABS: 15.0000 mg | ORAL_TABLET | Freq: Every day | ORAL | Status: DC

## 2013-07-28 MED ORDER — HYDROCHLOROTHIAZIDE 25 MG PO TABS: 25.0000 mg | ORAL_TABLET | Freq: Every day | ORAL | Status: DC

## 2013-07-28 MED ORDER — ESCITALOPRAM OXALATE 10 MG PO TABS
15.0000 mg | ORAL_TABLET | Freq: Every day | ORAL | Status: DC
  Filled 2013-07-28: qty 21

## 2013-07-28 MED ORDER — LISINOPRIL 10 MG PO TABS: 10.0000 mg | ORAL_TABLET | Freq: Every day | ORAL | Status: DC

## 2013-07-28 MED ORDER — GABAPENTIN 600 MG PO TABS
600.0000 mg | ORAL_TABLET | Freq: Three times a day (TID) | ORAL | Status: DC
  Filled 2013-07-28: qty 42

## 2013-07-28 MED ORDER — DIVALPROEX SODIUM ER 500 MG PO TB24: 500.0000 mg | ORAL_TABLET | Freq: Two times a day (BID) | ORAL | Status: DC

## 2013-07-28 MED ORDER — INSULIN GLARGINE 100 UNIT/ML ~~LOC~~ SOLN: 32.0000 [IU] | Freq: Every day | SUBCUTANEOUS | Status: DC

## 2013-07-28 MED ORDER — TRAZODONE HCL 50 MG PO TABS: 50.0000 mg | ORAL_TABLET | Freq: Every day | ORAL | Status: DC

## 2013-07-28 NOTE — Plan of Care (Signed)
Problem: Diagnosis: Increased Risk For Suicide Attempt Goal: STG-Patient Will Attend All Groups On The Unit Outcome: Progressing Pt attended evening group.   Goal: STG-Patient Will Report Suicidal Feelings to Staff Outcome: Progressing Pt denied SI this evening.   Goal: STG-Patient Will Comply With Medication Regime Outcome: Progressing Compliant with medications as ordered this evening.    Problem: Ineffective individual coping Goal: STG: Patient will remain free from self harm Outcome: Progressing Pt did not harm self this evening.
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
## 2013-07-28 NOTE — [facility] Group Notes (Signed)
[facility] Discharge Planning Group Note  07/28/2013 8:45 AM  Participation Quality: Alert, Appropriate and Oriented  Mood/Affect: "fantastic" mood; appropriate affect    Depression Rating: 0  Anxiety Rating: 0  Thoughts of Suicide: Pt denies SI/HI  Will you contract for safety? Yes  Current AVH: Pt denies  Plan for Discharge/Comments: Pt attended discharge planning group and actively participated in group. CSW provided pt with today's workbook. Pt is scheduled for discharge today, [facility] and is scheduled for an appointment with her PCP to get a referral for medication management.  Pt will return to her home at discharge.  Transportation Means: Pt reports access to transportation  Supports: No supports mentioned at this time  Chad CordialLauren Carter, LCSWA 07/28/2013 9:52 AM

## 2013-07-28 NOTE — Progress Notes (Signed)
[facility] Discharge Plan :  Will you be returning to the same living situation after discharge: Yes,  Pt will return to her home. At discharge, do you have transportation home?:Yes,  boyfriend to pick up Do you have the ability to pay for your medications:Yes,  Pt has private insurance and will be provided with a 30-day prescription  Release of information consent forms completed and in the chart;  Patient's signature needed at discharge.  Patient to Follow up at: Follow-up Information   Follow up with Dr. Juline Patchichard Pang On 07/30/2013. (for a follow-up with Dr. Ricki MillerPang.  Your appointment is at 10:15am)    Contact information:   [facility] [address], [place] [address] [phone]      [facility] On 08/11/2013. (Arrive at 9:00 am to meet with Jeri Modenaita Clark for intake assessment. Groups are Monday - Friday 9 am - 12 pm. )    Contact information:   [address] [place], [place] [address] Phone: [phone]      Patient denies SI/HI:   Yes,  Pt denies    Safety Planning and Suicide Prevention discussed:  Yes,  with fiance Iran PlanasJoe Dube.  See SPE note for further detail.  Elaina HoopsCarter, Mileydi Milsap M 07/28/2013, 11:37 AM

## 2013-07-29 NOTE — Progress Notes (Addendum)
Patient seen, Suicide Assessment Completed.  Disposition Plan Reviewed  

## 2013-07-30 ENCOUNTER — Ambulatory Visit (HOSPITAL_COMMUNITY)
Admission: RE | Admit: 2013-07-30 | Discharge: 2013-07-30 | Disposition: A | Discharge status: Home / Self Care | Payer: Self-pay | Attending: Psychiatry | Admitting: Psychiatry

## 2013-07-30 DIAGNOSIS — I10 Essential (primary) hypertension: Secondary | ICD-10-CM | POA: Insufficient documentation

## 2013-07-30 DIAGNOSIS — E7800 Pure hypercholesterolemia, unspecified: Secondary | ICD-10-CM | POA: Insufficient documentation

## 2013-07-30 DIAGNOSIS — F429 Obsessive-compulsive disorder, unspecified: Secondary | ICD-10-CM | POA: Insufficient documentation

## 2013-07-30 DIAGNOSIS — Z609 Problem related to social environment, unspecified: Secondary | ICD-10-CM | POA: Insufficient documentation

## 2013-07-30 DIAGNOSIS — E119 Type 2 diabetes mellitus without complications: Secondary | ICD-10-CM | POA: Insufficient documentation

## 2013-07-30 DIAGNOSIS — Z87891 Personal history of nicotine dependence: Secondary | ICD-10-CM | POA: Insufficient documentation

## 2013-07-30 DIAGNOSIS — F321 Major depressive disorder, single episode, moderate: Secondary | ICD-10-CM | POA: Insufficient documentation
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
## 2013-07-31 NOTE — Progress Notes (Signed)
Patient Discharge Instructions:  After Visit Summary (AVS):   Faxed to:  07/31/13 Psychiatric Admission Assessment Note:   Faxed to:  07/31/13 Suicide Risk Assessment - Discharge Assessment:   Faxed to:  07/31/13 Faxed/Sent to the Next Level Care provider:  07/31/13 Next Level Care Provider Has Access to the EMR, 07/31/13 Faxed to Dr. Juline Patchichard Pang - [facility] @ [phone] [facility] via [facility]/Epic access.   Jerelene ReddenSheena E , 07/31/2013, 3:37 PM

## 2013-08-11 ENCOUNTER — Other Ambulatory Visit (HOSPITAL_COMMUNITY): Payer: BC Managed Care – PPO | Attending: Psychiatry | Admitting: Psychiatry

## 2013-08-11 ENCOUNTER — Encounter (HOSPITAL_COMMUNITY): Payer: Self-pay

## 2013-08-11 DIAGNOSIS — F332 Major depressive disorder, recurrent severe without psychotic features: Secondary | ICD-10-CM | POA: Diagnosis not present

## 2013-08-11 DIAGNOSIS — E119 Type 2 diabetes mellitus without complications: Secondary | ICD-10-CM | POA: Insufficient documentation

## 2013-08-11 DIAGNOSIS — Z5987 Material hardship due to limited financial resources, not elsewhere classified: Secondary | ICD-10-CM | POA: Insufficient documentation

## 2013-08-11 DIAGNOSIS — E7800 Pure hypercholesterolemia, unspecified: Secondary | ICD-10-CM | POA: Diagnosis not present

## 2013-08-11 DIAGNOSIS — Z609 Problem related to social environment, unspecified: Secondary | ICD-10-CM | POA: Insufficient documentation

## 2013-08-11 DIAGNOSIS — Z598 Other problems related to housing and economic circumstances: Secondary | ICD-10-CM | POA: Insufficient documentation

## 2013-08-11 DIAGNOSIS — F411 Generalized anxiety disorder: Secondary | ICD-10-CM | POA: Diagnosis not present

## 2013-08-11 DIAGNOSIS — Z91199 Patient's noncompliance with other medical treatment and regimen due to unspecified reason: Secondary | ICD-10-CM | POA: Insufficient documentation

## 2013-08-11 DIAGNOSIS — G589 Mononeuropathy, unspecified: Secondary | ICD-10-CM | POA: Insufficient documentation

## 2013-08-11 DIAGNOSIS — F331 Major depressive disorder, recurrent, moderate: Secondary | ICD-10-CM

## 2013-08-11 DIAGNOSIS — F410 Panic disorder [episodic paroxysmal anxiety] without agoraphobia: Secondary | ICD-10-CM

## 2013-08-11 DIAGNOSIS — F913 Oppositional defiant disorder: Secondary | ICD-10-CM | POA: Diagnosis not present

## 2013-08-11 DIAGNOSIS — F4310 Post-traumatic stress disorder, unspecified: Secondary | ICD-10-CM | POA: Diagnosis not present

## 2013-08-11 DIAGNOSIS — F603 Borderline personality disorder: Secondary | ICD-10-CM | POA: Insufficient documentation

## 2013-08-11 DIAGNOSIS — Z9119 Patient's noncompliance with other medical treatment and regimen: Secondary | ICD-10-CM | POA: Insufficient documentation

## 2013-08-11 DIAGNOSIS — I10 Essential (primary) hypertension: Secondary | ICD-10-CM | POA: Diagnosis not present

## 2013-08-11 MED ORDER — LITHIUM CARBONATE 300 MG PO TABS: ORAL_TABLET | ORAL | Status: DC
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
## 2013-08-11 NOTE — Progress Notes (Signed)
    Daily Group Progress Note  Program: IOP  Group Time: 9:00-10:30  Participation Level: Active  Behavioral Response: Appropriate  Type of Therapy:  Group Therapy  Summary of Progress: Pt. Met with psychiatrist and case manager.     Group Time: 10:30-12:00  Participation Level:  Active  Behavioral Response: Appropriate  Type of Therapy: Psycho-education Group  Summary of Progress: Pt. Participated in discussion about the autonomic nervous system and the stigma of mental illness; Pt. Participated in discussion about [employer] video. Pt. Shared pain related to recent loss of job, loss of relationship with significant other, and feeling physical distance from support system out of state.  Nancie Neas, COUNS
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
## 2013-08-12 ENCOUNTER — Encounter (HOSPITAL_COMMUNITY): Payer: Self-pay | Admitting: Psychiatry

## 2013-08-12 ENCOUNTER — Other Ambulatory Visit (HOSPITAL_COMMUNITY): Payer: BC Managed Care – PPO | Admitting: Psychiatry

## 2013-08-12 DIAGNOSIS — F331 Major depressive disorder, recurrent, moderate: Secondary | ICD-10-CM

## 2013-08-12 DIAGNOSIS — F332 Major depressive disorder, recurrent severe without psychotic features: Secondary | ICD-10-CM | POA: Diagnosis not present

## 2013-08-12 NOTE — Progress Notes (Signed)
    Daily Group Progress Note  Program: IOP  Group Time: 9:00-10:30  Participation Level: Active  Behavioral Response: Appropriate  Type of Therapy:  Group Therapy  Summary of Progress: Pt. Was alert and attentive. Pt. Reports that she continues to be tearful and experienced panic attack last night. Pt. Continues to be challenges by grief related to loss of employment, poor relationship boundaries and toxic family relationships.     Group Time: 10:30-12:00  Participation Level:  Active  Behavioral Response: Appropriate  Type of Therapy: Psycho-education Group  Summary of Progress: Pt. Was alert and attentive. Pt. Participated in discussion and the development of mindfulness and use of meditation as a coping skill.  Shaune PollackBrown, Jennifer B, COUNS

## 2013-08-13 ENCOUNTER — Other Ambulatory Visit (HOSPITAL_COMMUNITY): Payer: BC Managed Care – PPO | Admitting: Psychiatry

## 2013-08-13 DIAGNOSIS — F332 Major depressive disorder, recurrent severe without psychotic features: Secondary | ICD-10-CM | POA: Diagnosis not present

## 2013-08-13 DIAGNOSIS — F331 Major depressive disorder, recurrent, moderate: Secondary | ICD-10-CM

## 2013-08-13 DIAGNOSIS — F410 Panic disorder [episodic paroxysmal anxiety] without agoraphobia: Secondary | ICD-10-CM
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
## 2013-08-14 ENCOUNTER — Other Ambulatory Visit (HOSPITAL_COMMUNITY): Payer: BC Managed Care – PPO | Admitting: Psychiatry

## 2013-08-14 ENCOUNTER — Encounter (HOSPITAL_COMMUNITY): Payer: Self-pay | Admitting: Psychiatry

## 2013-08-14 NOTE — Progress Notes (Signed)
    Daily Group Progress Note  Program: IOP  Group Time: 9:00-10:30  Participation Level: Active  Behavioral Response: Appropriate  Type of Therapy:  Psycho-education Group  Summary of Progress: Pt. Participated in group facilitated by Theda BelfastBob Hamilton.     Group Time: 10:30-12:00  Participation Level:  Active  Behavioral Response: Appropriate  Type of Therapy: Group Therapy  Summary of Progress: Pt. Continues to be challenged by anger regarding loss of employment, loss of relationship with her parents, grief related to infertility, and failed suicide attempt. Pt. Is also poorly managing type 2 diabetes, PCOD.  Shaune PollackBrown, Jennifer B, COUNS
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
## 2013-08-15 ENCOUNTER — Other Ambulatory Visit (HOSPITAL_COMMUNITY): Payer: BC Managed Care – PPO | Admitting: Psychiatry

## 2013-08-15 ENCOUNTER — Encounter (HOSPITAL_COMMUNITY): Payer: Self-pay | Admitting: Psychiatry

## 2013-08-15 DIAGNOSIS — F331 Major depressive disorder, recurrent, moderate: Secondary | ICD-10-CM

## 2013-08-15 DIAGNOSIS — F332 Major depressive disorder, recurrent severe without psychotic features: Secondary | ICD-10-CM | POA: Diagnosis not present

## 2013-08-15 NOTE — Progress Notes (Signed)
    Daily Group Progress Note  Program: IOP  Group Time: 9:00-10:30  Participation Level: Minimal  Behavioral Response: Appropriate  Type of Therapy:  Group Therapy  Summary of Progress: Pt. Not committed to group therapy process. Pt. Reports that she would prefer to be at home. Pt. Reported that she spent yesterday afternoon actively looking for a job.    Group Time: 10:30-12:00  Participation Level:  Minimal  Behavioral Response: Appropriate  Type of Therapy: Psycho-education Group  Summary of Progress: Pt. Was alert and attentive during instruction about breathwork. Pt. Did not participate in meditation exercise.  Shaune PollackBrown, Jennifer B, COUNS

## 2013-08-18 ENCOUNTER — Encounter (HOSPITAL_COMMUNITY): Payer: Self-pay | Admitting: Psychiatry

## 2013-08-18 ENCOUNTER — Other Ambulatory Visit (HOSPITAL_COMMUNITY): Payer: BC Managed Care – PPO | Admitting: Psychiatry

## 2013-08-18 DIAGNOSIS — F410 Panic disorder [episodic paroxysmal anxiety] without agoraphobia: Secondary | ICD-10-CM

## 2013-08-18 DIAGNOSIS — F331 Major depressive disorder, recurrent, moderate: Secondary | ICD-10-CM

## 2013-08-18 DIAGNOSIS — F332 Major depressive disorder, recurrent severe without psychotic features: Secondary | ICD-10-CM | POA: Diagnosis not present

## 2013-08-18 NOTE — Progress Notes (Signed)
    Daily Group Progress Note  Program: IOP  Group Time: 9:00-10:30  Participation Level: Minimal  Behavioral Response: Appropriate  Type of Therapy:  Group Therapy  Summary of Progress: Pt. Reported that she was "fine" and did not have a good weekend; Pt. Offered minimal elaboration about herself or feedback for the group.     Group Time: 10:30-12:00  Participation Level:  Minimal  Behavioral Response: Appropriate  Type of Therapy: Psycho-education Group  Summary of Progress: Pt. Participated in discussion about developing emotional and sensory awareness. Pt. Watched Grayling CongressJill Bolte Taylor video.  Shaune PollackBrown, Kayliah Tindol B, COUNS

## 2013-08-19 ENCOUNTER — Other Ambulatory Visit (HOSPITAL_COMMUNITY): Payer: BC Managed Care – PPO | Admitting: Psychiatry

## 2013-08-19 DIAGNOSIS — F410 Panic disorder [episodic paroxysmal anxiety] without agoraphobia: Secondary | ICD-10-CM

## 2013-08-19 DIAGNOSIS — F332 Major depressive disorder, recurrent severe without psychotic features: Secondary | ICD-10-CM | POA: Diagnosis not present

## 2013-08-19 DIAGNOSIS — F331 Major depressive disorder, recurrent, moderate: Secondary | ICD-10-CM

## 2013-08-19 NOTE — Progress Notes (Addendum)
Patient ID: [name] HavensMichelle Unsworth, female   DOB: 1970/12/07, 43 y.o.   MRN: [id number] Patient seen today, is very gamy stating she does not want to take her medications. Patient was supported and encouraged to take her insulin and her antidepressant and continue her other medications. We discussed the importance of compliance      she did agree to take her meds .Marland Kitchen. Patient states she is looking for a job. Is worried about her insurance running out discussed that we will talk to the case manager regarding this. Denies active suicidal ideation has chronic passive suicidal ideation no plan. No homicidal ideation no hallucinations or delusions. Patient has been attending group and is participating in it.

## 2013-08-20 ENCOUNTER — Encounter (HOSPITAL_COMMUNITY): Payer: Self-pay | Admitting: Psychiatry

## 2013-08-20 ENCOUNTER — Other Ambulatory Visit (HOSPITAL_COMMUNITY): Payer: BC Managed Care – PPO | Admitting: Psychiatry

## 2013-08-20 DIAGNOSIS — F410 Panic disorder [episodic paroxysmal anxiety] without agoraphobia: Secondary | ICD-10-CM

## 2013-08-20 DIAGNOSIS — F331 Major depressive disorder, recurrent, moderate: Secondary | ICD-10-CM

## 2013-08-20 NOTE — Progress Notes (Deleted)
Patient ID: [name] HavensMichelle [name], female   DOB: 05/23/1970, 43 y.o.   MRN: [id number]

## 2013-08-20 NOTE — Patient Instructions (Signed)
Patient will complete MH-IOP on 08-22-13.  Follow up with [name], LCSW on 08-26-13 @ 2pm and Dr. Michae KavaAgarwal on 09-23-13 @ 9a.m.  Encouraged support groups.

## 2013-08-20 NOTE — Progress Notes (Signed)
    Daily Group Progress Note  Program: IOP  Group Time: 9:00-10:30  Participation Level: Minimal  Behavioral Response: Appropriate  Type of Therapy:  Group Therapy  Summary of Progress: Pt. Continues to report that she is doing "fine". Pt. Has primarily flat affect. Pt. Appears that she is not committed to the therapeutic process and comments related to desiring to go home.      Group Time: 10:30-12:00  Participation Level:  Minimal  Behavioral Response: Appropriate  Type of Therapy: Psycho-education Group  Summary of Progress: Pt. Was alert during discussion about developing self-care behaviors.  Shaune PollackBrown, Jennifer B, COUNS
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
## 2013-08-21 ENCOUNTER — Encounter (HOSPITAL_COMMUNITY): Payer: Self-pay | Admitting: Psychiatry

## 2013-08-21 ENCOUNTER — Other Ambulatory Visit (HOSPITAL_COMMUNITY): Payer: BC Managed Care – PPO | Admitting: Psychiatry

## 2013-08-21 ENCOUNTER — Encounter (HOSPITAL_COMMUNITY): Payer: Self-pay | Admitting: Emergency Medicine

## 2013-08-21 ENCOUNTER — Emergency Department (HOSPITAL_COMMUNITY)
Admission: EM | Admit: 2013-08-21 | Discharge: 2013-08-22 | Disposition: A | Discharge status: Home / Self Care | Payer: BC Managed Care – PPO | Attending: Emergency Medicine | Admitting: Emergency Medicine

## 2013-08-21 DIAGNOSIS — F329 Major depressive disorder, single episode, unspecified: Secondary | ICD-10-CM | POA: Insufficient documentation

## 2013-08-21 DIAGNOSIS — T43502A Poisoning by unspecified antipsychotics and neuroleptics, intentional self-harm, initial encounter: Secondary | ICD-10-CM | POA: Insufficient documentation

## 2013-08-21 DIAGNOSIS — I10 Essential (primary) hypertension: Secondary | ICD-10-CM | POA: Insufficient documentation

## 2013-08-21 DIAGNOSIS — G589 Mononeuropathy, unspecified: Secondary | ICD-10-CM | POA: Insufficient documentation

## 2013-08-21 DIAGNOSIS — T43224A Poisoning by selective serotonin reuptake inhibitors, undetermined, initial encounter: Secondary | ICD-10-CM | POA: Insufficient documentation

## 2013-08-21 DIAGNOSIS — E119 Type 2 diabetes mellitus without complications: Secondary | ICD-10-CM | POA: Diagnosis not present

## 2013-08-21 DIAGNOSIS — F3289 Other specified depressive episodes: Secondary | ICD-10-CM | POA: Insufficient documentation

## 2013-08-21 DIAGNOSIS — Z8742 Personal history of other diseases of the female genital tract: Secondary | ICD-10-CM | POA: Insufficient documentation

## 2013-08-21 DIAGNOSIS — Z794 Long term (current) use of insulin: Secondary | ICD-10-CM | POA: Insufficient documentation

## 2013-08-21 DIAGNOSIS — R739 Hyperglycemia, unspecified: Secondary | ICD-10-CM

## 2013-08-21 DIAGNOSIS — F419 Anxiety disorder, unspecified: Secondary | ICD-10-CM | POA: Diagnosis present

## 2013-08-21 DIAGNOSIS — F332 Major depressive disorder, recurrent severe without psychotic features: Secondary | ICD-10-CM | POA: Diagnosis not present

## 2013-08-21 DIAGNOSIS — Z79899 Other long term (current) drug therapy: Secondary | ICD-10-CM | POA: Insufficient documentation

## 2013-08-21 DIAGNOSIS — Z3202 Encounter for pregnancy test, result negative: Secondary | ICD-10-CM | POA: Insufficient documentation

## 2013-08-21 DIAGNOSIS — F411 Generalized anxiety disorder: Secondary | ICD-10-CM | POA: Insufficient documentation

## 2013-08-21 DIAGNOSIS — Z87891 Personal history of nicotine dependence: Secondary | ICD-10-CM | POA: Diagnosis not present

## 2013-08-21 DIAGNOSIS — T50902A Poisoning by unspecified drugs, medicaments and biological substances, intentional self-harm, initial encounter: Secondary | ICD-10-CM | POA: Diagnosis present

## 2013-08-21 DIAGNOSIS — F4310 Post-traumatic stress disorder, unspecified: Secondary | ICD-10-CM | POA: Diagnosis present

## 2013-08-21 DIAGNOSIS — T438X2A Poisoning by other psychotropic drugs, intentional self-harm, initial encounter: Secondary | ICD-10-CM | POA: Insufficient documentation

## 2013-08-21 LAB — URINALYSIS, ROUTINE W REFLEX MICROSCOPIC
Bilirubin Urine: NEGATIVE
Glucose, UA: >1000 mg/dL — AB
Hgb urine dipstick: NEGATIVE
Ketones, ur: 15 mg/dL — AB
Leukocytes, UA: NEGATIVE
Nitrite: NEGATIVE
Protein, ur: NEGATIVE mg/dL
Specific Gravity, Urine: 1.04 — ABNORMAL HIGH (ref 1.005–1.030)
Urobilinogen, UA: 0.2 mg/dL (ref 0.0–1.0)
pH: 5 (ref 5.0–8.0)

## 2013-08-21 LAB — CBC WITH DIFFERENTIAL/PLATELET
Basophils Absolute: 0.1 K/uL (ref 0.0–0.1)
Basophils Relative: 1 % (ref 0–1)
Eosinophils Absolute: 0.1 K/uL (ref 0.0–0.7)
Eosinophils Relative: 1 % (ref 0–5)
HCT: 43.5 % (ref 36.0–46.0)
Hemoglobin: 15.9 g/dL — ABNORMAL HIGH (ref 12.0–15.0)
Lymphocytes Relative: 23 % (ref 12–46)
Lymphs Abs: 2.3 K/uL (ref 0.7–4.0)
MCH: 31.2 pg (ref 26.0–34.0)
MCHC: 36.6 g/dL — ABNORMAL HIGH (ref 30.0–36.0)
MCV: 85.3 fL (ref 78.0–100.0)
Monocytes Absolute: 0.5 K/uL (ref 0.1–1.0)
Monocytes Relative: 5 % (ref 3–12)
Neutro Abs: 6.9 K/uL (ref 1.7–7.7)
Neutrophils Relative %: 70 % (ref 43–77)
Platelets: 285 K/uL (ref 150–400)
RBC: 5.1 MIL/uL (ref 3.87–5.11)
RDW: 12.8 % (ref 11.5–15.5)
WBC: 9.9 K/uL (ref 4.0–10.5)

## 2013-08-21 LAB — CBG MONITORING, ED: GLUCOSE-CAPILLARY: 154 mg/dL — AB (ref 70–99)

## 2013-08-21 LAB — COMPREHENSIVE METABOLIC PANEL
ALBUMIN: 3.2 g/dL — AB (ref 3.5–5.2)
ALT: 14 U/L (ref 0–35)
ANION GAP: 15 (ref 5–15)
AST: 13 U/L (ref 0–37)
Alkaline Phosphatase: 80 U/L (ref 39–117)
BUN: 8 mg/dL (ref 6–23)
CO2: 25 mEq/L (ref 19–32)
Calcium: 9.3 mg/dL (ref 8.4–10.5)
Chloride: 91 mEq/L — ABNORMAL LOW (ref 96–112)
Creatinine, Ser: 0.53 mg/dL (ref 0.50–1.10)
GFR calc non Af Amer: >90 mL/min (ref >90)
GLUCOSE: 412 mg/dL — AB (ref 70–99)
Potassium: 3.7 mEq/L (ref 3.7–5.3)
Sodium: 131 mEq/L — ABNORMAL LOW (ref 137–147)
Total Bilirubin: 0.4 mg/dL (ref 0.3–1.2)
Total Protein: 7.8 g/dL (ref 6.0–8.3)

## 2013-08-21 LAB — RAPID URINE DRUG SCREEN, HOSP PERFORMED
Amphetamines: NOT DETECTED
Barbiturates: NOT DETECTED
Benzodiazepines: NOT DETECTED
Cocaine: NOT DETECTED
Opiates: NOT DETECTED
Tetrahydrocannabinol: POSITIVE — AB

## 2013-08-21 LAB — LITHIUM LEVEL

## 2013-08-21 LAB — PREGNANCY, URINE: Preg Test, Ur: NEGATIVE

## 2013-08-21 LAB — VALPROIC ACID LEVEL: Valproic Acid Lvl: <10 ug/mL — ABNORMAL LOW (ref 50.0–100.0)

## 2013-08-21 LAB — URINE MICROSCOPIC-ADD ON

## 2013-08-21 LAB — SALICYLATE LEVEL: Salicylate Lvl: <2 mg/dL — ABNORMAL LOW (ref 2.8–20.0)

## 2013-08-21 LAB — ACETAMINOPHEN LEVEL: Acetaminophen (Tylenol), Serum: <15 ug/mL (ref 10–30)

## 2013-08-21 MED ORDER — FAMOTIDINE 20 MG PO TABS
40.0000 mg | ORAL_TABLET | Freq: Two times a day (BID) | ORAL | Status: DC
  Administered 2013-08-21 – 2013-08-22 (×2): 40 mg via ORAL
  Filled 2013-08-21 (×2): qty 2

## 2013-08-21 MED ORDER — HYDROCHLOROTHIAZIDE 25 MG PO TABS
25.0000 mg | ORAL_TABLET | Freq: Every day | ORAL | Status: DC
  Administered 2013-08-22: 25 mg via ORAL
  Filled 2013-08-21 (×2): qty 1

## 2013-08-21 MED ORDER — LITHIUM CARBONATE 300 MG PO CAPS
300.0000 mg | ORAL_CAPSULE | Freq: Two times a day (BID) | ORAL | Status: DC
  Administered 2013-08-21 – 2013-08-22 (×2): 300 mg via ORAL
  Filled 2013-08-21 (×2): qty 1

## 2013-08-21 MED ORDER — INSULIN GLARGINE 100 UNIT/ML ~~LOC~~ SOLN: 12.0000 [IU] | Freq: Once | SUBCUTANEOUS | Status: DC

## 2013-08-21 MED ORDER — SODIUM CHLORIDE 0.9 % IV SOLN
1000.0000 mL | Freq: Once | INTRAVENOUS | Status: AC
  Administered 2013-08-21: 1000 mL via INTRAVENOUS

## 2013-08-21 MED ORDER — INSULIN ASPART 100 UNIT/ML ~~LOC~~ SOLN
0.0000 [IU] | Freq: Three times a day (TID) | SUBCUTANEOUS | Status: DC
  Administered 2013-08-22: 8 [IU] via SUBCUTANEOUS
  Administered 2013-08-22: 3 [IU] via SUBCUTANEOUS
  Filled 2013-08-21 (×3): qty 1

## 2013-08-21 MED ORDER — SODIUM CHLORIDE 0.9 % IV SOLN: 1000.0000 mL | INTRAVENOUS | Status: DC

## 2013-08-21 MED ORDER — INSULIN GLARGINE 100 UNIT/ML ~~LOC~~ SOLN
32.0000 [IU] | Freq: Every day | SUBCUTANEOUS | Status: DC
  Administered 2013-08-21: 32 [IU] via SUBCUTANEOUS
  Filled 2013-08-21: qty 0.32

## 2013-08-21 MED ORDER — SODIUM CHLORIDE 0.9 % IV BOLUS (SEPSIS)
1000.0000 mL | Freq: Once | INTRAVENOUS | Status: AC
  Administered 2013-08-21: 1000 mL via INTRAVENOUS

## 2013-08-21 MED ORDER — LITHIUM CARBONATE 300 MG PO CAPS: 300.0000 mg | ORAL_CAPSULE | Freq: Two times a day (BID) | ORAL | Status: DC

## 2013-08-21 MED ORDER — DIVALPROEX SODIUM ER 500 MG PO TB24: 500.0000 mg | ORAL_TABLET | Freq: Two times a day (BID) | ORAL | Status: DC

## 2013-08-21 MED ORDER — INSULIN ASPART 100 UNIT/ML ~~LOC~~ SOLN
12.0000 [IU] | Freq: Once | SUBCUTANEOUS | Status: AC
Start: 2013-08-21 — End: 2013-08-21
  Administered 2013-08-21: 12 [IU] via SUBCUTANEOUS
  Filled 2013-08-21: qty 1

## 2013-08-21 MED ORDER — GABAPENTIN 300 MG PO CAPS
600.0000 mg | ORAL_CAPSULE | Freq: Three times a day (TID) | ORAL | Status: DC
  Administered 2013-08-21 – 2013-08-22 (×3): 600 mg via ORAL
  Filled 2013-08-21 (×3): qty 2

## 2013-08-21 MED ORDER — ESCITALOPRAM OXALATE 10 MG PO TABS
15.0000 mg | ORAL_TABLET | Freq: Every day | ORAL | Status: DC
  Administered 2013-08-22: 15 mg via ORAL
  Filled 2013-08-21: qty 2

## 2013-08-21 MED ORDER — INSULIN GLARGINE 100 UNIT/ML ~~LOC~~ SOLN
32.0000 [IU] | Freq: Every day | SUBCUTANEOUS | Status: DC
  Filled 2013-08-21: qty 0.32

## 2013-08-21 NOTE — ED Notes (Signed)
Bed: WA17 Expected date:  Expected time:  Means of arrival:  Comments: OD

## 2013-08-21 NOTE — ED Notes (Signed)
Visitor/patient's boyfriend visiting. Visitor was wanded by security prior to visiting  Sitter remains at the bedside.

## 2013-08-21 NOTE — ED Notes (Signed)
Per poison control:  Ambien overdose should have no respiratory depression, but may cause sleepiness.   Lexapro OD can cause seizures, rigidity, QT and QRS prolongation, agitation.  Lexapro peaks 3-4hrs, with half life of 27-32hrs.    PC recommends for treatment: fluids, EKG q2h x3, no charcoal.  If QRS>120 give bicarb.  If QTc>500, correct electrolytes.  To tx agitation, seizures, rigidity, etc. Give benzodiazapines..   Suggested labwork: vaproic acid, lithium, electrolytes including magnesium, CK, ammonia, Tylenol and ASA.  Repeat valproic acid and lithium in 6-8hrs.    Recommendations given by Carollee HerterShannon at [place] [facility].
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
## 2013-08-21 NOTE — ED Notes (Signed)
Poison Control- debra requested a UDS, depakote, and alcohol level be drawn.

## 2013-08-21 NOTE — ED Notes (Signed)
Belongings bag x 1 placed in locker #27.

## 2013-08-21 NOTE — ED Notes (Signed)
Pt has in belonging bag:  Two pill bottles, white bra, white sandals, white and brown shorts, purple shirt, green underwear at the nurses station.
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
## 2013-08-21 NOTE — Progress Notes (Signed)
    Daily Group Progress Note  Program: IOP  Group Time: 9:00-10:30  Participation Level: Minimal  Behavioral Response: Appropriate  Type of Therapy:  Group Therapy  Summary of Progress: Pt. Reported that she was doing "fine". Pt. Continues to not share in group and maintains that group therapy has not been helpful for her, does not engage in the group process.     Group Time: 10:30-12:00  Participation Level:  Minimal  Behavioral Response: Appropriate  Type of Therapy: Psycho-education Group  Summary of Progress: Pt. Was present and attentive during discussion about identifying cognitive distortions.  Shaune PollackBrown, Jennifer B, COUNS

## 2013-08-22 ENCOUNTER — Other Ambulatory Visit (HOSPITAL_COMMUNITY): Payer: BC Managed Care – PPO

## 2013-08-22 ENCOUNTER — Encounter (HOSPITAL_COMMUNITY): Payer: Self-pay | Admitting: Psychiatry

## 2013-08-22 DIAGNOSIS — F4310 Post-traumatic stress disorder, unspecified: Secondary | ICD-10-CM

## 2013-08-22 DIAGNOSIS — F419 Anxiety disorder, unspecified: Secondary | ICD-10-CM | POA: Diagnosis present

## 2013-08-22 DIAGNOSIS — F411 Generalized anxiety disorder: Secondary | ICD-10-CM

## 2013-08-22 LAB — CBG MONITORING, ED
GLUCOSE-CAPILLARY: 196 mg/dL — AB (ref 70–99)
Glucose-Capillary: 185 mg/dL — ABNORMAL HIGH (ref 70–99)
Glucose-Capillary: 257 mg/dL — ABNORMAL HIGH (ref 70–99)
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
## 2013-08-24 ENCOUNTER — Emergency Department (HOSPITAL_COMMUNITY)
Admission: EM | Admit: 2013-08-24 | Discharge: 2013-08-27 | Disposition: A | Discharge status: Home / Self Care | Payer: BC Managed Care – PPO | Attending: Emergency Medicine | Admitting: Emergency Medicine

## 2013-08-24 ENCOUNTER — Encounter (HOSPITAL_COMMUNITY): Payer: Self-pay | Admitting: Emergency Medicine

## 2013-08-24 DIAGNOSIS — Z79899 Other long term (current) drug therapy: Secondary | ICD-10-CM | POA: Diagnosis not present

## 2013-08-24 DIAGNOSIS — T438X2A Poisoning by other psychotropic drugs, intentional self-harm, initial encounter: Secondary | ICD-10-CM | POA: Insufficient documentation

## 2013-08-24 DIAGNOSIS — Z87891 Personal history of nicotine dependence: Secondary | ICD-10-CM | POA: Insufficient documentation

## 2013-08-24 DIAGNOSIS — F419 Anxiety disorder, unspecified: Secondary | ICD-10-CM

## 2013-08-24 DIAGNOSIS — T50902A Poisoning by unspecified drugs, medicaments and biological substances, intentional self-harm, initial encounter: Secondary | ICD-10-CM

## 2013-08-24 DIAGNOSIS — F329 Major depressive disorder, single episode, unspecified: Secondary | ICD-10-CM | POA: Diagnosis not present

## 2013-08-24 DIAGNOSIS — T43502A Poisoning by unspecified antipsychotics and neuroleptics, intentional self-harm, initial encounter: Secondary | ICD-10-CM | POA: Diagnosis not present

## 2013-08-24 DIAGNOSIS — G589 Mononeuropathy, unspecified: Secondary | ICD-10-CM | POA: Insufficient documentation

## 2013-08-24 DIAGNOSIS — E119 Type 2 diabetes mellitus without complications: Secondary | ICD-10-CM | POA: Diagnosis not present

## 2013-08-24 DIAGNOSIS — T43011A Poisoning by tricyclic antidepressants, accidental (unintentional), initial encounter: Secondary | ICD-10-CM | POA: Diagnosis not present

## 2013-08-24 DIAGNOSIS — T426X1A Poisoning by other antiepileptic and sedative-hypnotic drugs, accidental (unintentional), initial encounter: Secondary | ICD-10-CM | POA: Diagnosis not present

## 2013-08-24 DIAGNOSIS — I10 Essential (primary) hypertension: Secondary | ICD-10-CM | POA: Diagnosis not present

## 2013-08-24 DIAGNOSIS — T426X2A Poisoning by other antiepileptic and sedative-hypnotic drugs, intentional self-harm, initial encounter: Secondary | ICD-10-CM | POA: Diagnosis not present

## 2013-08-24 DIAGNOSIS — F411 Generalized anxiety disorder: Secondary | ICD-10-CM | POA: Diagnosis not present

## 2013-08-24 DIAGNOSIS — T50902D Poisoning by unspecified drugs, medicaments and biological substances, intentional self-harm, subsequent encounter: Secondary | ICD-10-CM

## 2013-08-24 DIAGNOSIS — R45851 Suicidal ideations: Secondary | ICD-10-CM | POA: Insufficient documentation

## 2013-08-24 DIAGNOSIS — F4310 Post-traumatic stress disorder, unspecified: Secondary | ICD-10-CM | POA: Diagnosis present

## 2013-08-24 DIAGNOSIS — F314 Bipolar disorder, current episode depressed, severe, without psychotic features: Secondary | ICD-10-CM | POA: Diagnosis present

## 2013-08-24 DIAGNOSIS — T4271XA Poisoning by unspecified antiepileptic and sedative-hypnotic drugs, accidental (unintentional), initial encounter: Secondary | ICD-10-CM

## 2013-08-24 DIAGNOSIS — T4272XA Poisoning by unspecified antiepileptic and sedative-hypnotic drugs, intentional self-harm, initial encounter: Secondary | ICD-10-CM | POA: Insufficient documentation

## 2013-08-24 DIAGNOSIS — F3289 Other specified depressive episodes: Secondary | ICD-10-CM | POA: Diagnosis not present

## 2013-08-24 LAB — COMPREHENSIVE METABOLIC PANEL
ALBUMIN: 2.9 g/dL — AB (ref 3.5–5.2)
ALK PHOS: 85 U/L (ref 39–117)
ALT: 11 U/L (ref 0–35)
ANION GAP: 15 (ref 5–15)
AST: 12 U/L (ref 0–37)
BUN: 9 mg/dL (ref 6–23)
CHLORIDE: 96 meq/L (ref 96–112)
CO2: 26 mEq/L (ref 19–32)
Calcium: 9.3 mg/dL (ref 8.4–10.5)
Creatinine, Ser: 0.51 mg/dL (ref 0.50–1.10)
GFR calc Af Amer: >90 mL/min (ref >90)
GFR calc non Af Amer: >90 mL/min (ref >90)
Glucose, Bld: 501 mg/dL — ABNORMAL HIGH (ref 70–99)
POTASSIUM: 3.7 meq/L (ref 3.7–5.3)
Sodium: 137 mEq/L (ref 137–147)
TOTAL PROTEIN: 7 g/dL (ref 6.0–8.3)
Total Bilirubin: 0.3 mg/dL (ref 0.3–1.2)

## 2013-08-24 LAB — ACETAMINOPHEN LEVEL

## 2013-08-24 LAB — RAPID URINE DRUG SCREEN, HOSP PERFORMED
Amphetamines: NOT DETECTED
Barbiturates: NOT DETECTED
Benzodiazepines: NOT DETECTED
COCAINE: NOT DETECTED
OPIATES: NOT DETECTED
Tetrahydrocannabinol: NOT DETECTED

## 2013-08-24 LAB — CBC WITH DIFFERENTIAL/PLATELET
BASOS ABS: 0.1 10*3/uL (ref 0.0–0.1)
BASOS PCT: 1 % (ref 0–1)
EOS ABS: 0.1 10*3/uL (ref 0.0–0.7)
Eosinophils Relative: 2 % (ref 0–5)
HCT: 40.2 % (ref 36.0–46.0)
HEMOGLOBIN: 14.4 g/dL (ref 12.0–15.0)
Lymphocytes Relative: 26 % (ref 12–46)
Lymphs Abs: 2.2 10*3/uL (ref 0.7–4.0)
MCH: 30.7 pg (ref 26.0–34.0)
MCHC: 35.8 g/dL (ref 30.0–36.0)
MCV: 85.7 fL (ref 78.0–100.0)
MONOS PCT: 7 % (ref 3–12)
Monocytes Absolute: 0.6 10*3/uL (ref 0.1–1.0)
NEUTROS ABS: 5.5 10*3/uL (ref 1.7–7.7)
NEUTROS PCT: 64 % (ref 43–77)
Platelets: 275 10*3/uL (ref 150–400)
RBC: 4.69 MIL/uL (ref 3.87–5.11)
RDW: 12.9 % (ref 11.5–15.5)
WBC: 8.4 10*3/uL (ref 4.0–10.5)

## 2013-08-24 LAB — CBG MONITORING, ED
GLUCOSE-CAPILLARY: 340 mg/dL — AB (ref 70–99)
Glucose-Capillary: 277 mg/dL — ABNORMAL HIGH (ref 70–99)

## 2013-08-24 LAB — ETHANOL: Alcohol, Ethyl (B): <11 mg/dL (ref 0–11)

## 2013-08-24 LAB — VALPROIC ACID LEVEL

## 2013-08-24 LAB — LITHIUM LEVEL: Lithium Lvl: 0.41 mEq/L — ABNORMAL LOW (ref 0.80–1.40)

## 2013-08-24 LAB — SALICYLATE LEVEL: Salicylate Lvl: <2 mg/dL — ABNORMAL LOW (ref 2.8–20.0)

## 2013-08-24 MED ORDER — HYDROCHLOROTHIAZIDE 25 MG PO TABS
25.0000 mg | ORAL_TABLET | Freq: Every day | ORAL | Status: DC
  Administered 2013-08-25 – 2013-08-27 (×3): 25 mg via ORAL
  Filled 2013-08-24 (×4): qty 1

## 2013-08-24 MED ORDER — INSULIN ASPART 100 UNIT/ML ~~LOC~~ SOLN
0.0000 [IU] | Freq: Every day | SUBCUTANEOUS | Status: DC
  Administered 2013-08-25: 3 [IU] via SUBCUTANEOUS
  Administered 2013-08-26: 4 [IU] via SUBCUTANEOUS
  Filled 2013-08-24 (×2): qty 1

## 2013-08-24 MED ORDER — INSULIN GLARGINE 100 UNIT/ML ~~LOC~~ SOLN
32.0000 [IU] | Freq: Every day | SUBCUTANEOUS | Status: DC
  Administered 2013-08-24 – 2013-08-26 (×3): 32 [IU] via SUBCUTANEOUS
  Filled 2013-08-24 (×4): qty 0.32

## 2013-08-24 MED ORDER — SODIUM CHLORIDE 0.9 % IV BOLUS (SEPSIS)
1000.0000 mL | Freq: Once | INTRAVENOUS | Status: AC
  Administered 2013-08-24: 1000 mL via INTRAVENOUS

## 2013-08-24 MED ORDER — IBUPROFEN 200 MG PO TABS: 600.0000 mg | ORAL_TABLET | Freq: Three times a day (TID) | ORAL | Status: DC | PRN

## 2013-08-24 MED ORDER — ACETAMINOPHEN 325 MG PO TABS: 650.0000 mg | ORAL_TABLET | ORAL | Status: DC | PRN

## 2013-08-24 MED ORDER — INSULIN ASPART 100 UNIT/ML ~~LOC~~ SOLN
0.0000 [IU] | Freq: Three times a day (TID) | SUBCUTANEOUS | Status: DC
  Administered 2013-08-25 (×2): 8 [IU] via SUBCUTANEOUS
  Administered 2013-08-25 – 2013-08-26 (×2): 5 [IU] via SUBCUTANEOUS
  Administered 2013-08-26 (×2): 8 [IU] via SUBCUTANEOUS
  Administered 2013-08-27: 11 [IU] via SUBCUTANEOUS
  Filled 2013-08-24 (×5): qty 1

## 2013-08-24 MED ORDER — LORAZEPAM 1 MG PO TABS: 1.0000 mg | ORAL_TABLET | Freq: Three times a day (TID) | ORAL | Status: DC | PRN

## 2013-08-24 MED ORDER — INSULIN ASPART 100 UNIT/ML ~~LOC~~ SOLN
10.0000 [IU] | Freq: Once | SUBCUTANEOUS | Status: AC
  Administered 2013-08-24: 10 [IU] via SUBCUTANEOUS
  Filled 2013-08-24: qty 1

## 2013-08-24 MED ORDER — FAMOTIDINE 20 MG PO TABS
40.0000 mg | ORAL_TABLET | Freq: Two times a day (BID) | ORAL | Status: DC
  Administered 2013-08-24 – 2013-08-27 (×6): 40 mg via ORAL
  Filled 2013-08-24 (×6): qty 2

## 2013-08-24 MED ORDER — LITHIUM CARBONATE 300 MG PO CAPS
300.0000 mg | ORAL_CAPSULE | Freq: Two times a day (BID) | ORAL | Status: DC
  Administered 2013-08-24 – 2013-08-27 (×6): 300 mg via ORAL
  Filled 2013-08-24 (×6): qty 1

## 2013-08-24 NOTE — ED Notes (Signed)
Jeana from [facility] had a following suggestion of a lithium level at 2200.

## 2013-08-24 NOTE — ED Provider Notes (Signed)
CSN: [id number]     Arrival date & time 08/24/13  1557 History   First MD Initiated Contact with Patient 08/24/13 1605     Chief Complaint  Patient presents with  . Ingestion  . Suicidal     (Consider location/radiation/quality/duration/timing/severity/associated sxs/prior Treatment) The history is provided by the patient.  [name] is a 43 y.o. female hx of DM, HTN, PCOS here with possible overdose. Patient has been feeling depressed. She took her morning meds. Around 1 pm, she took about 6 ambien pills. Around 3pm she took about 8-10 nortriptyline. She said that she just want to die. Also tried to overdose 3 days ago and a month ago. He denies alcohol use or other drug use.     Past Medical History  Diagnosis Date  . Diabetes mellitus without complication   . Hypertension   . High cholesterol   . Neuropathy   . PCOS (polycystic ovarian syndrome)   . Anxiety   . Depression    Past Surgical History  Procedure Laterality Date  . Elbow surgery     Family History  Problem Relation Age of Onset  . Depression Mother   . Anxiety disorder Mother    History  Substance Use Topics  . Smoking status: Former Smoker    Types: Cigarettes  . Smokeless tobacco: Not on file  . Alcohol Use: Yes     Comment: occasional   OB History   Grav Para Term Preterm Abortions TAB SAB Ect Mult Living                 Review of Systems  Psychiatric/Behavioral: Positive for suicidal ideas.  All other systems reviewed and are negative.     Allergies  Review of patient's allergies indicates no known allergies.  Home Medications   Prior to Admission medications   Medication Sig Start Date End Date Taking? Authorizing Provider  escitalopram (LEXAPRO) 20 MG tablet Take 20 mg by mouth daily.   Yes Historical Provider, MD  famotidine (PEPCID) 40 MG tablet Take 1 tablet (40 mg total) by mouth 2 (two) times daily. 07/25/13  Yes Beau Fanny, FNP  gabapentin (NEURONTIN) 300 MG capsule  Take 2 capsules (600 mg total) by mouth 3 (three) times daily. 07/25/13  Yes Beau Fanny, FNP  hydrochlorothiazide (HYDRODIURIL) 25 MG tablet Take 1 tablet (25 mg total) by mouth daily. 07/28/13  Yes Beau Fanny, FNP  lithium 300 MG tablet Take 300 mg by mouth 2 (two) times daily.   Yes Historical Provider, MD  nortriptyline (PAMELOR) 10 MG capsule Take 10 mg by mouth at bedtime.   Yes Historical Provider, MD  traZODone (DESYREL) 50 MG tablet Take 1 tablet (50 mg total) by mouth at bedtime. 07/28/13  Yes Beau Fanny, FNP  zolpidem (AMBIEN CR) 12.5 MG CR tablet Take 12.5 mg by mouth at bedtime as needed. For sleep 08/05/13  Yes Historical Provider, MD   BP 107/66  Pulse 80  Temp(Src) 98.8 F (37.1 C) (Oral)  Resp 18  SpO2 99%  LMP 08/07/2013 Physical Exam  Nursing note and vitals reviewed. Constitutional: She is oriented to person, place, and time.  Depressed   HENT:  Head: Normocephalic.  Mouth/Throat: Oropharynx is clear and moist.  Eyes: Conjunctivae are normal. Pupils are equal, round, and reactive to light.  Neck: Normal range of motion. Neck supple.  Cardiovascular: Normal rate, regular rhythm and normal heart sounds.   Pulmonary/Chest: Effort normal and breath sounds normal. No respiratory  distress. She has no wheezes. She has no rales.  Abdominal: Soft. Bowel sounds are normal. She exhibits no distension. There is no tenderness. There is no rebound and no guarding.  Musculoskeletal: Normal range of motion.  Neurological: She is alert and oriented to person, place, and time. No cranial nerve deficit. Coordination normal.  Skin: Skin is warm and dry.  Psychiatric:  Depressed, poor judgment     ED Course  Procedures (including critical care time) Labs Review Labs Reviewed  COMPREHENSIVE METABOLIC PANEL - Abnormal; Notable for the following:    Glucose, Bld 501 (*)    Albumin 2.9 (*)    All other components within normal limits  SALICYLATE LEVEL - Abnormal; Notable for  the following:    Salicylate Lvl <2.0 (*)    All other components within normal limits  LITHIUM LEVEL - Abnormal; Notable for the following:    Lithium Lvl 0.41 (*)    All other components within normal limits  VALPROIC ACID LEVEL - Abnormal; Notable for the following:    Valproic Acid Lvl <10.0 (*)    All other components within normal limits  CBG MONITORING, ED - Abnormal; Notable for the following:    Glucose-Capillary 340 (*)    All other components within normal limits  CBC WITH DIFFERENTIAL  ETHANOL  ACETAMINOPHEN LEVEL  URINE RAPID DRUG SCREEN (HOSP PERFORMED)    Imaging Review No results found.   EKG Interpretation   Date/Time:  Sunday August 24 2013 16:16:15 EDT Ventricular Rate:  95 PR Interval:  159 QRS Duration: 78 QT Interval:  410 QTC Calculation: 515 R Axis:   41 Text Interpretation:  Sinus rhythm Low voltage, precordial leads  Anteroseptal infarct, old Borderline T abnormalities, diffuse leads QT  slightly prolonged  Confirmed by YAO  MD, DAVID ([id number]) on 08/24/2013  5:23:18 PM      MDM   Final diagnoses:  None    [name] is a 43 y.o. female here with depression, possible overdose. Nurse called poison control, recommend observe for 6 hrs for CNS depression, tachycardia, seizures, QRS widening. Will get labs and tox levels. Will hydrate and reassess.   9:06 PM TTS saw patient. Recommend IVC. Will do IVC patient. Repeat EKG showed QT prolongation resolved now. Observed in the ED for 6 hr. Patient alert and awake. Hyperglycemic but not in DKA (uncompliant with meds). Medically cleared.     Richardean Canal, MD 08/24/13 2109

## 2013-08-24 NOTE — ED Notes (Signed)
Writer spoke with Gena at [employer], observe for CNS depression, Tachycardia, HTN, Seizures QRS Widing.  Recommended hydration, Depakote, Lithium, Lytes for labs, EKG, Tylenol level in 4 hours, Observe for 6 hours or until stable. If Lexapro is confirmed as ingestion monitor for 24 hours with Repeat EKG.

## 2013-08-24 NOTE — ED Notes (Signed)
Pt requested to eat. Dr Silverio Lay notified and wants CBG checked befeor food is given. Pt notified

## 2013-08-24 NOTE — ED Notes (Signed)
Pt from home vis EMS-Per EMS Pt took a handful of Nortriptyline and 6 Ambien PTA. Pt had a similar attempt with the same med combo 3 days ago at this facility and was released back home. Pt is A&O and in NAD. Pt boyfriend arrived with pt and was sent by Charge RN to lobby until tx is complete.

## 2013-08-24 NOTE — ED Notes (Signed)
Patient presents guarded, depressed with flat affect; denies any current thoughts of SI/HI/AVH; states that she does not know why she overdosed. NAD

## 2013-08-24 NOTE — ED Notes (Signed)
Pt fiance at bedside. Visitor was wanded by security PTA
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
## 2013-08-24 NOTE — ED Notes (Addendum)
Pt reports that she took pills today PTA because, "I want to die, I just have things going on." Pt denies that anything triggered this and that she did the same thing 3 days ago and also 1 month ago. Pt is A&O and in NAD. Dr Silverio Lay at bedside. Pt denies taking other medications except for her regular scheduled meds, no ETOH.

## 2013-08-24 NOTE — ED Notes (Signed)
Bed: RESA Expected date: 08/24/13 Expected time: 3:53 PM Means of arrival: Ambulance Comments: OD

## 2013-08-25 ENCOUNTER — Encounter (HOSPITAL_COMMUNITY): Payer: Self-pay | Admitting: Psychiatry

## 2013-08-25 ENCOUNTER — Other Ambulatory Visit (HOSPITAL_COMMUNITY): Payer: BC Managed Care – PPO

## 2013-08-25 DIAGNOSIS — F4310 Post-traumatic stress disorder, unspecified: Secondary | ICD-10-CM

## 2013-08-25 DIAGNOSIS — F411 Generalized anxiety disorder: Secondary | ICD-10-CM

## 2013-08-25 DIAGNOSIS — R45851 Suicidal ideations: Secondary | ICD-10-CM

## 2013-08-25 DIAGNOSIS — F314 Bipolar disorder, current episode depressed, severe, without psychotic features: Secondary | ICD-10-CM | POA: Diagnosis present

## 2013-08-25 LAB — LITHIUM LEVEL: Lithium Lvl: 0.28 mEq/L — ABNORMAL LOW (ref 0.80–1.40)

## 2013-08-25 LAB — CBG MONITORING, ED: GLUCOSE-CAPILLARY: 262 mg/dL — AB (ref 70–99)

## 2013-08-25 MED ORDER — POTASSIUM CHLORIDE CRYS ER 10 MEQ PO TBCR
10.0000 meq | EXTENDED_RELEASE_TABLET | Freq: Every day | ORAL | Status: DC
  Administered 2013-08-25 – 2013-08-27 (×3): 10 meq via ORAL
  Filled 2013-08-25 (×2): qty 1
  Filled 2013-08-25: qty 0.5

## 2013-08-25 MED ORDER — GABAPENTIN 300 MG PO CAPS
600.0000 mg | ORAL_CAPSULE | Freq: Three times a day (TID) | ORAL | Status: DC
  Administered 2013-08-25 – 2013-08-27 (×7): 600 mg via ORAL
  Filled 2013-08-25 (×7): qty 2

## 2013-08-25 MED ORDER — TRAZODONE HCL 50 MG PO TABS
50.0000 mg | ORAL_TABLET | Freq: Every day | ORAL | Status: DC
  Administered 2013-08-25 – 2013-08-26 (×2): 50 mg via ORAL
  Filled 2013-08-25 (×2): qty 1
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
## 2013-08-25 NOTE — ED Notes (Signed)
cbg [id number] 

## 2013-08-25 NOTE — ED Notes (Signed)
Report received from Yolanda RN. Pt. Alert and oriented in no distress denies SI, HI, AVH and pain. Will continue to monitor for safety. Pt. Instructed to come to me with problems or concerns. Q 15 minute checks continue. 

## 2013-08-25 NOTE — Progress Notes (Signed)
Inpatient Diabetes Program Recommendations  AACE/ADA: New Consensus Statement on Inpatient Glycemic Control (2013)  Target Ranges:  Prepandial:   less than 140 mg/dL      Peak postprandial:   less than 180 mg/dL (1-2 hours)      Critically ill patients:  140 - 180 mg/dL   Reason for Visit: Hyperglycemia  Diabetes history: DM2 Outpatient Diabetes medications: Has not been taking home insulin (has taken Lantus 60 units QHS per medical records in the past) Current orders for Inpatient glycemic control: Novolog moderate tidwc and hs, Lantus 38 units QHS  Uncontrolled blood sugars. Pt apparantly stopped taking insulin at home d/t depression.  Inpatient Diabetes Program Recommendations Insulin - Basal: Increase Lantus to 36 units QHS Correction (SSI): Increase Novolog to resistant tidwc and hs Insulin - Meal Coverage: Add Novolog 6 units tidwc for meal coverage insulin if pt eats >50% meal HgbA1C: 13.2% - uncontrolled Would benefit from OP Diabetes Education when psych issues are stabilized.  Note: Will continue to follow. Thank you. Ailene Ards, RD, LDN, CDE Inpatient Diabetes Coordinator [phone]

## 2013-08-25 NOTE — Progress Notes (Signed)
  CARE MANAGEMENT ED NOTE 08/25/2013  Patient:  [name],[name]   Account Number:  [id number]  Date Initiated:  08/25/2013  Documentation initiated by:  Edd Arbour  Subjective/Objective Assessment:   43 yr old bcbs [place] p[place] pt without a pcp listed reports that she took pills today PTA because, "I want to die, I just have things going on." OD on nortriptyline and Ambien recently d/c from ED on 08/23/13 Last admission to [facility] 7/     Subjective/Objective Assessment Detail:   pt states pcp is Dr Ricki Miller  States she does not have a Veterinary surgeon or psychologist  PMH DM, HTn, PCOS     Action/Plan:   ED Cm spoke with pt updated epic   Action/Plan Detail:   Anticipated DC Date:       Status Recommendation to Physician:   Result of Recommendation:    Other ED Services  Consult Working Plan    DC Planning Services  Other  PCP issues  Outpatient Services - Pt will follow up    Choice offered to / List presented to:            Status of service:  Completed, signed off  ED Comments:   ED Comments Detail:

## 2013-08-25 NOTE — BH Assessment (Signed)
[name], [facility], confirms adult unit is currently at capacity. Contacted the following facilities for placement:  BED AVAILABLE, FAXED CLINICAL INFORMATION: [facility], per [facility], per Peggy  AT CAPACITY: [facility], per Renato Shin, per [facility], per [facility], per [facility], per [facility], per [facility], per [facility], per [facility], per [facility], per [facility], per [facility], per [facility], per [employer]  NO RESPONSE: [name]   [address] [name], [place], [facility] Triage Specialist [phone]

## 2013-08-26 ENCOUNTER — Other Ambulatory Visit (HOSPITAL_COMMUNITY): Payer: BC Managed Care – PPO

## 2013-08-26 ENCOUNTER — Encounter (HOSPITAL_COMMUNITY): Payer: Self-pay | Admitting: Registered Nurse

## 2013-08-26 ENCOUNTER — Ambulatory Visit (HOSPITAL_COMMUNITY): Payer: Self-pay | Admitting: Licensed Clinical Social Worker

## 2013-08-26 LAB — CBG MONITORING, ED
Glucose-Capillary: 261 mg/dL — ABNORMAL HIGH (ref 70–99)
Glucose-Capillary: 249 mg/dL — ABNORMAL HIGH (ref 70–99)
Glucose-Capillary: 274 mg/dL — ABNORMAL HIGH (ref 70–99)
Glucose-Capillary: 235 mg/dL — ABNORMAL HIGH (ref 70–99)
Glucose-Capillary: 265 mg/dL — ABNORMAL HIGH (ref 70–99)
Glucose-Capillary: 260 mg/dL — ABNORMAL HIGH (ref 70–99)
Glucose-Capillary: 320 mg/dL — ABNORMAL HIGH (ref 70–99)
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
## 2013-08-26 NOTE — BH Assessment (Signed)
Patient evaluated by Dr. Taylor/Shuvon, NP and inpatient treatment was recommended. No [facility] beds available at this time. [facility] sts that a bed may become available later today.  Patient referred to the following facilities for inpatient treatment: [facility], [facility], [facility], [facility], [facility], [facility], [facility], [facility], [facility], [facility], [facility], [facility], and [facility].  
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
## 2013-08-27 ENCOUNTER — Other Ambulatory Visit (HOSPITAL_COMMUNITY): Payer: BC Managed Care – PPO

## 2013-08-27 DIAGNOSIS — F3130 Bipolar disorder, current episode depressed, mild or moderate severity, unspecified: Secondary | ICD-10-CM

## 2013-08-27 LAB — CBG MONITORING, ED: GLUCOSE-CAPILLARY: 312 mg/dL — AB (ref 70–99)

## 2013-08-27 NOTE — BH Assessment (Signed)
Discharge per Dr. Ladona Ridgel, with outpatient follow up at [facility]. Patient already has a established appointment with Dr. Volanda Napoleon in the outpatient department September 23, 2013 for medication management. Patient also has a outpatient appointment with Counselor Juvea Friday September 26, 2013 @ 4pm.

## 2013-08-27 NOTE — Consult Note (Signed)
Face to face evaluation and I agree with this note 

## 2013-08-27 NOTE — Consult Note (Signed)
  Psychiatric Specialty Exam: Physical Exam  ROS  Blood pressure 119/66, pulse 87, temperature 98.7 F (37.1 C), temperature source Oral, resp. rate 18, last menstrual period 08/07/2013, SpO2 100.00%.There is no weight on file to calculate BMI.  General Appearance: Well Groomed  Patent attorney::  Good  Speech:  Clear and Coherent  Volume:  Normal  Mood:  Euthymic  Affect:  Appropriate  Thought Process:  Coherent and Logical  Orientation:  Full (Time, Place, and Person)  Thought Content:  Negative  Suicidal Thoughts:  No  Homicidal Thoughts:  No  Memory:  Immediate;   Good Recent;   Good Remote;   Good  Judgement:  Good  Insight:  Good  Psychomotor Activity:  Normal  Concentration:  Good  Recall:  Good  Akathisia:  Negative  Handed:  Right  AIMS (if indicated):     Assets:  Communication Skills Desire for Improvement Financial Resources/Insurance Housing Intimacy Leisure Time Physical Health Resilience Social Support Talents/Skills Transportation Vocational/Educational  Sleep:   good  No longer suicidal, I talked to her fiance who agrees she is safe to come home and will be with someone for the next few weeks.  She wants to go home and will be discharged home to continue follow up outpatient

## 2013-08-27 NOTE — Progress Notes (Addendum)
Pt was given [place] Resource packet for psycho-educational material. Pt was informed of purpose of this information. Pt was receptive of this.  

## 2013-08-27 NOTE — [facility] Suicide Risk Assessment (Signed)
Suicide Risk Assessment  Discharge Assessment     Demographic Factors:  Caucasian  Total Time spent with patient: 30 minutes  Psychiatric Specialty Exam:     Blood pressure 119/66, pulse 87, temperature 98.7 F (37.1 C), temperature source Oral, resp. rate 18, last menstrual period 08/07/2013, SpO2 100.00%.There is no weight on file to calculate BMI.  General Appearance: Well Groomed  Patent attorney::  Good  Speech:  Clear and Coherent  Volume:  Normal  Mood:  Euthymic  Affect:  Appropriate  Thought Process:  Coherent and Logical  Orientation:  Full (Time, Place, and Person)  Thought Content:  Negative  Suicidal Thoughts:  No  Homicidal Thoughts:  No  Memory:  Immediate;   Good Recent;   Good Remote;   Good  Judgement:  Good  Insight:  Good  Psychomotor Activity:  Normal  Concentration:  Good  Recall:  Good  Fund of Knowledge:Good  Language: Good  Akathisia:  Negative  Handed:  Right  AIMS (if indicated):     Assets:  Communication Skills Desire for Improvement Financial Resources/Insurance Housing Intimacy Leisure Time Physical Health Resilience Social Support Talents/Skills Transportation Vocational/Educational  Sleep:       Musculoskeletal: Strength & Muscle Tone: within normal limits Gait & Station: normal Patient leans: N/A   Mental Status Per Nursing Assessment::   On Admission:     Current Mental Status by Physician: NA  Loss Factors: NA  Historical Factors: Prior suicide attempts  Risk Reduction Factors:   Living with another person, especially a relative, Positive social support, Positive therapeutic relationship and Positive coping skills or problem solving skills  Continued Clinical Symptoms:  Some depression  Cognitive Features That Contribute To Risk:  none   Suicide Risk:  Minimal: No identifiable suicidal ideation.  Patients presenting with no risk factors but with morbid ruminations; may be classified as minimal risk based  on the severity of the depressive symptoms  Discharge Diagnoses:   AXIS I:  Bipolar, Depressed AXIS II:  Deferred AXIS III:   Past Medical History  Diagnosis Date  . Diabetes mellitus without complication   . Hypertension   . High cholesterol   . Neuropathy   . PCOS (polycystic ovarian syndrome)   . Anxiety   . Depression    AXIS IV:  chronic mental illness AXIS V:  61-70 mild symptoms  Plan Of Care/Follow-up recommendations:  Activity:  resume usual activity Diet:  resume usual diet  Is patient on multiple antipsychotic therapies at discharge:  No   Has Patient had three or more failed trials of antipsychotic monotherapy by history:  No  Recommended Plan for Multiple Antipsychotic Therapies: NA    Caro Brundidge D 08/27/2013, 10:32 AM

## 2013-08-27 NOTE — Consult Note (Signed)
Review of Systems   Constitutional: Negative.    HENT: Negative.    Eyes: Negative.    Respiratory: Negative.    Cardiovascular: Negative.    Gastrointestinal: Negative.    Genitourinary: Negative.    Musculoskeletal: Negative.    Skin: Negative.    Neurological: Negative.    Endo/Heme/Allergies: Negative.    Psychiatric/Behavioral: Positive for depression.

## 2013-08-28 ENCOUNTER — Other Ambulatory Visit (HOSPITAL_COMMUNITY): Payer: BC Managed Care – PPO

## 2013-08-29 ENCOUNTER — Other Ambulatory Visit (HOSPITAL_COMMUNITY): Payer: BC Managed Care – PPO

## 2013-08-31 ENCOUNTER — Other Ambulatory Visit (HOSPITAL_COMMUNITY): Payer: Self-pay | Admitting: Psychiatry

## 2013-09-01 ENCOUNTER — Other Ambulatory Visit (HOSPITAL_COMMUNITY): Payer: BC Managed Care – PPO

## 2013-09-02 ENCOUNTER — Other Ambulatory Visit (HOSPITAL_COMMUNITY): Payer: BC Managed Care – PPO
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
## 2013-09-03 ENCOUNTER — Other Ambulatory Visit (HOSPITAL_COMMUNITY): Payer: BC Managed Care – PPO

## 2013-09-04 ENCOUNTER — Other Ambulatory Visit (HOSPITAL_COMMUNITY): Payer: BC Managed Care – PPO

## 2013-09-04 ENCOUNTER — Encounter (HOSPITAL_COMMUNITY): Payer: Self-pay | Admitting: Emergency Medicine

## 2013-09-04 ENCOUNTER — Emergency Department (HOSPITAL_COMMUNITY): Payer: BC Managed Care – PPO

## 2013-09-04 ENCOUNTER — Emergency Department (HOSPITAL_COMMUNITY)
Admission: EM | Admit: 2013-09-04 | Discharge: 2013-09-04 | Disposition: A | Discharge status: Home / Self Care | Payer: BC Managed Care – PPO | Attending: Emergency Medicine | Admitting: Emergency Medicine

## 2013-09-04 DIAGNOSIS — Z79899 Other long term (current) drug therapy: Secondary | ICD-10-CM | POA: Insufficient documentation

## 2013-09-04 DIAGNOSIS — Y92009 Unspecified place in unspecified non-institutional (private) residence as the place of occurrence of the external cause: Secondary | ICD-10-CM | POA: Diagnosis not present

## 2013-09-04 DIAGNOSIS — M19019 Primary osteoarthritis, unspecified shoulder: Secondary | ICD-10-CM | POA: Diagnosis not present

## 2013-09-04 DIAGNOSIS — F411 Generalized anxiety disorder: Secondary | ICD-10-CM | POA: Diagnosis not present

## 2013-09-04 DIAGNOSIS — S4980XA Other specified injuries of shoulder and upper arm, unspecified arm, initial encounter: Secondary | ICD-10-CM | POA: Insufficient documentation

## 2013-09-04 DIAGNOSIS — E119 Type 2 diabetes mellitus without complications: Secondary | ICD-10-CM | POA: Insufficient documentation

## 2013-09-04 DIAGNOSIS — Z87891 Personal history of nicotine dependence: Secondary | ICD-10-CM | POA: Diagnosis not present

## 2013-09-04 DIAGNOSIS — Y9389 Activity, other specified: Secondary | ICD-10-CM | POA: Insufficient documentation

## 2013-09-04 DIAGNOSIS — F3289 Other specified depressive episodes: Secondary | ICD-10-CM | POA: Diagnosis not present

## 2013-09-04 DIAGNOSIS — I10 Essential (primary) hypertension: Secondary | ICD-10-CM | POA: Insufficient documentation

## 2013-09-04 DIAGNOSIS — W010XXA Fall on same level from slipping, tripping and stumbling without subsequent striking against object, initial encounter: Secondary | ICD-10-CM | POA: Diagnosis not present

## 2013-09-04 DIAGNOSIS — S46909A Unspecified injury of unspecified muscle, fascia and tendon at shoulder and upper arm level, unspecified arm, initial encounter: Secondary | ICD-10-CM | POA: Diagnosis not present

## 2013-09-04 DIAGNOSIS — F329 Major depressive disorder, single episode, unspecified: Secondary | ICD-10-CM | POA: Insufficient documentation

## 2013-09-04 DIAGNOSIS — M25512 Pain in left shoulder: Secondary | ICD-10-CM

## 2013-09-04 MED ORDER — HYDROCODONE-ACETAMINOPHEN 5-325 MG PO TABS: 1.0000 | ORAL_TABLET | Freq: Four times a day (QID) | ORAL | Status: DC | PRN

## 2013-09-04 MED ORDER — HYDROCODONE-ACETAMINOPHEN 5-325 MG PO TABS
1.0000 | ORAL_TABLET | Freq: Once | ORAL | Status: AC
  Administered 2013-09-04: 1 via ORAL
  Filled 2013-09-04: qty 1

## 2013-09-04 MED ORDER — NAPROXEN 500 MG PO TABS: 500.0000 mg | ORAL_TABLET | Freq: Two times a day (BID) | ORAL | Status: DC | PRN
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
## 2013-09-04 NOTE — Discharge Instructions (Signed)
Wear shoulder sling for no more than 3 days, then begin performing gentle range of motion exercises. Ice your shoulder throughout the day, using an ice pack for 20 minutes at a time every hour, then alternate to using heat for the same amount of time. Alternate between naprosyn and norco for pain relief. Do not drive or operate machinery with pain medication use. Call orthopedic follow up today or tomorrow to schedule followup appointment for recheck of ongoing shoulder pain in 1-2 weeks that can be canceled with a 24-48 hour notice if complete resolution of pain. See your regular doctor in 1 week for recheck. Return to the ER for changes or worsening symptoms.   Acromioclavicular Injuries The AC (acromioclavicular) joint is the joint in the shoulder where the collarbone (clavicle) meets the shoulder blade (scapula). The part of the shoulder blade connected to the collarbone is called the acromion. Common problems with and treatments for the [facility] joint are detailed below. ARTHRITIS Arthritis occurs when the joint has been injured and the smooth padding between the joints (cartilage) is lost. This is the wear and tear seen in most joints of the body if they have been overused. This causes the joint to produce pain and swelling which is worse with activity.  AC JOINT SEPARATION AC joint separation means that the ligaments connecting the acromion of the shoulder blade and collarbone have been damaged, and the two bones no longer line up. AC separations can be anywhere from mild to severe, and are "graded" depending upon which ligaments are torn and how badly they are torn.  Grade I Injury: the least damage is done, and the [facility] joint still lines up.  Grade II Injury: damage to the ligaments which reinforce the [facility] joint. In a Grade II injury, these ligaments are stretched but not entirely torn. When stressed, the [facility] joint becomes painful and unstable.  Grade III Injury: AC and secondary ligaments are  completely torn, and the collarbone is no longer attached to the shoulder blade. This results in deformity; a prominence of the end of the clavicle. AC JOINT FRACTURE AC joint fracture means that there has been a break in the bones of the [facility] joint, usually the end of the clavicle. TREATMENT TREATMENT OF AC ARTHRITIS  There is currently no way to replace the cartilage damaged by arthritis. The best way to improve the condition is to decrease the activities which aggravate the problem. Application of ice to the joint helps decrease pain and soreness (inflammation). The use of non-steroidal anti-inflammatory medication is helpful.  If less conservative measures do not work, then cortisone shots (injections) may be used. These are anti-inflammatories; they decrease the soreness in the joint and swelling.  If non-surgical measures fail, surgery may be recommended. The procedure is generally removal of a portion of the end of the clavicle. This is the part of the collarbone closest to your acromion which is stabilized with ligaments to the acromion of the shoulder blade. This surgery may be performed using a tube-like instrument with a light (arthroscope) for looking into a joint. It may also be performed as an open surgery through a small incision by the surgeon. Most patients will have good range of motion within 6 weeks and may return to all activity including sports by 8-12 weeks, barring complications. TREATMENT OF AN AC SEPARATION  The initial treatment is to decrease pain. This is best accomplished by immobilizing the arm in a sling and placing an ice pack to the shoulder  for 20 to 30 minutes every 2 hours as needed. As the pain starts to subside, it is important to begin moving the fingers, wrist, elbow and eventually the shoulder in order to prevent a stiff or "frozen" shoulder. Instruction on when and how much to move the shoulder will be provided by your caregiver. The length of time needed to  regain full motion and function depends on the amount or grade of the injury. Recovery from a Grade I AC separation usually takes 10 to 14 days, whereas a Grade III may take 6 to 8 weeks.  Grade I and II separations usually do not require surgery. Even Grade III injuries usually allow return to full activity with few restrictions. Treatment is also based on the activity demands of the injured shoulder. For example, a high level quarterback with an injured throwing arm will receive more aggressive treatment than someone with a desk job who rarely uses his/her arm for strenuous activities. In some cases, a painful lump may persist which could require a later surgery. Surgery can be very successful, but the benefits must be weighed against the potential risks. TREATMENT OF AN AC JOINT FRACTURE Fracture treatment depends on the type of fracture. Sometimes a splint or sling may be all that is required. Other times surgery may be required for repair. This is more frequently the case when the ligaments supporting the clavicle are completely torn. Your caregiver will help you with these decisions and together you can decide what will be the best treatment. HOME CARE INSTRUCTIONS   Apply ice to the injury for 15-20 minutes each hour while awake for 2 days. Put the ice in a plastic bag and place a towel between the bag of ice and skin.  If a sling has been applied, wear it constantly for as long as directed by your caregiver, even at night. The sling or splint can be removed for bathing or showering or as directed. Be sure to keep the shoulder in the same place as when the sling is on. Do not lift the arm.  If a figure-of-eight splint has been applied it should be tightened gently by another person every day. Tighten it enough to keep the shoulders held back. Allow enough room to place the index finger between the body and strap. Loosen the splint immediately if there is numbness or tingling in the hands.  Take  over-the-counter or prescription medicines for pain, discomfort or fever as directed by your caregiver.  If you or your child has received a follow up appointment, it is very important to keep that appointment in order to avoid long term complications, chronic pain or disability. SEEK MEDICAL CARE IF:   The pain is not relieved with medications.  There is increased swelling or discoloration that continues to get worse rather than better.  You or your child has been unable to follow up as instructed.  There is progressive numbness and tingling in the arm, forearm or hand. SEEK IMMEDIATE MEDICAL CARE IF:   The arm is numb, cold or pale.  There is increasing pain in the hand, forearm or fingers. MAKE SURE YOU:   Understand these instructions.  Will watch your condition.  Will get help right away if you are not doing well or get worse. Document Released: 09/28/2004 Document Revised: 03/13/2011 Document Reviewed: 03/23/2008 [facility] Patient Information 2015 [place], [place]. This information is not intended to replace advice given to you by your health care provider. Make sure you discuss any questions  you have with your health care provider.  Cryotherapy Cryotherapy is when you put ice on your injury. Ice helps lessen pain and puffiness (swelling) after an injury. Ice works the best when you start using it in the first 24 to 48 hours after an injury. HOME CARE  Put a dry or damp towel between the ice pack and your skin.  You may press gently on the ice pack.  Leave the ice on for no more than 10 to 20 minutes at a time.  Check your skin after 5 minutes to make sure your skin is okay.  Rest at least 20 minutes between ice pack uses.  Stop using ice when your skin loses feeling (numbness).  Do not use ice on someone who cannot tell you when it hurts. This includes small children and people with memory problems (dementia). GET HELP RIGHT AWAY IF:  You have white spots on your  skin.  Your skin turns blue or pale.  Your skin feels waxy or hard.  Your puffiness gets worse. MAKE SURE YOU:   Understand these instructions.  Will watch your condition.  Will get help right away if you are not doing well or get worse. Document Released: 06/07/2007 Document Revised: 03/13/2011 Document Reviewed: 08/11/2010 [facility] Patient Information 2015 [place], [place]. This information is not intended to replace advice given to you by your health care provider. Make sure you discuss any questions you have with your health care provider.  Heat Therapy Heat therapy can help make painful, stiff muscles and joints feel better. Do not use heat on new injuries. Wait at least 48 hours after an injury to use heat. Do not use heat when you have aches or pains right after an activity. If you still have pain 3 hours after stopping the activity, then you may use heat. HOME CARE Wet heat pack  Soak a clean towel in warm water. Squeeze out the extra water.  Put the warm, wet towel in a plastic bag.  Place a thin, dry towel between your skin and the bag.  Put the heat pack on the area for 5 minutes, and check your skin. Your skin may be pink, but it should not be red.  Leave the heat pack on the area for 15 to 30 minutes.  Repeat this every 2 to 4 hours while awake. Do not use heat while you are sleeping. Warm water bath  Fill a tub with warm water.  Place the affected body part in the tub.  Soak the area for 20 to 40 minutes.  Repeat as needed. Hot water bottle  Fill the water bottle half full with hot water.  Press out the extra air. Close the cap tightly.  Place a dry towel between your skin and the bottle.  Put the bottle on the area for 5 minutes, and check your skin. Your skin may be pink, but it should not be red.  Leave the bottle on the area for 15 to 30 minutes.  Repeat this every 2 to 4 hours while awake. Electric heating pad  Place a dry towel between your  skin and the heating pad.  Set the heating pad on low heat.  Put the heating pad on the area for 10 minutes, and check your skin. Your skin may be pink, but it should not be red.  Leave the heating pad on the area for 20 to 40 minutes.  Repeat this every 2 to 4 hours while awake.  Do not  lie on the heating pad.  Do not fall asleep while using the heating pad.  Do not use the heating pad near water. GET HELP RIGHT AWAY IF:  You get blisters or red skin.  Your skin is puffy (swollen), or you lose feeling (numbness) in the affected area.  You have any new problems.  Your problems are getting worse.  You have any questions or concerns. If you have any problems, stop using heat therapy until you see your doctor. MAKE SURE YOU:  Understand these instructions.  Will watch your condition.  Will get help right away if you are not doing well or get worse. Document Released: 03/13/2011 Document Reviewed: 02/11/2013 [facility] Patient Information 2015 [place], [place]. This information is not intended to replace advice given to you by your health care provider. Make sure you discuss any questions you have with your health care provider.  Shoulder Range of Motion Exercises The shoulder is the most flexible joint in the human body. Because of this it is also the most unstable joint in the body. All ages can develop shoulder problems. Early treatment of problems is necessary for a good outcome. People react to shoulder pain by decreasing the movement of the joint. After a brief period of time, the shoulder can become "frozen". This is an almost complete loss of the ability to move the damaged shoulder. Following injuries your caregivers can give you instructions on exercises to keep your range of motion (ability to move your shoulder freely), or regain it if it has been lost.  EXERCISES EXERCISES TO MAINTAIN THE MOBILITY OF YOUR SHOULDER: Codman's Exercise or Pendulum Exercise  This exercise  may be performed in a prone (face-down) lying position or standing while leaning on a chair with the opposite arm. Its purpose is to relax the muscles in your shoulder and slowly but surely increase the range of motion and to relieve pain.  Lie on your stomach close to the side edge of the bed. Let your weak arm hang over the edge of the bed. Relax your shoulder, arm and hand. Let your shoulder blade relax and drop down.  Slowly and gently swing your arm forward and back. Do not use your neck muscles; relax them. It might be easier to have someone else gently start swinging your arm.  As pain decreases, increase your swing. To start, arm swing should begin at 15 degree angles. In time and as pain lessens, move to 30-45 degree angles. Start with swinging for about 15 seconds, and work towards swinging for 3 to 5 minutes.  This exercise may also be performed in a standing/bent over position.  Stand and hold onto a sturdy chair with your good arm. Bend forward at the waist and bend your knees slightly to help protect your back. Relax your weak arm, let it hang limp. Relax your shoulder blade and let it drop.  Keep your shoulder relaxed and use body motion to swing your arm in small circles.  Stand up tall and relax.  Repeat motion and change direction of circles.  Start with swinging for about 30 seconds, and work towards swinging for 3 to 5 minutes. STRETCHING EXERCISES:  Lift your arm out in front of you with the elbow bent at 90 degrees. Using your other arm gently pull the elbow forward and across your body.  Bend one arm behind you with the palm facing outward. Using the other arm, hold a towel or rope and reach this arm up above your  head, then bend it at the elbow to move your wrist to behind your neck. Grab the free end of the towel with the hand behind your back. Gently pull the towel up with the hand behind your neck, gradually increasing the pull on the hand behind the small of your  back. Then, gradually pull down with the hand behind the small of your back. This will pull the hand and arm behind your neck further. Both shoulders will have an increased range of motion with repetition of this exercise. STRENGTHENING EXERCISES:  Standing with your arm at your side and straight out from your shoulder with the elbow bent at 90 degrees, hold onto a small weight and slowly raise your hand so it points straight up in the air. Repeat this five times to begin with, and gradually increase to ten times. Do this four times per day. As you grow stronger you can gradually increase the weight.  Repeat the above exercise, only this time using an elastic band. Start with your hand up in the air and pull down until your hand is by your side. As you grow stronger, gradually increase the amount you pull by increasing the number or size of the elastic bands. Use the same amount of repetitions.  Standing with your hand at your side and holding onto a weight, gradually lift the hand in front of you until it is over your head. Do the same also with the hand remaining at your side and lift the hand away from your body until it is again over your head. Repeat this five times to begin with, and gradually increase to ten times. Do this four times per day. As you grow stronger you can gradually increase the weight. Document Released: 09/17/2002 Document Revised: 12/24/2012 Document Reviewed: 12/19/2004 [facility] Patient Information 2015 [place], [place]. This information is not intended to replace advice given to you by your health care provider. Make sure you discuss any questions you have with your health care provider.

## 2013-09-04 NOTE — ED Notes (Signed)
Pt escorted to discharge window. Pt verbalized understanding discharge instructions. In no acute distress.  

## 2013-09-04 NOTE — ED Provider Notes (Signed)
CSN: [id number]     Arrival date & time 09/04/13  1032 History   First MD Initiated Contact with Patient 09/04/13 1102     Chief Complaint  Patient presents with  . Fall  . Shoulder Injury     (Consider location/radiation/quality/duration/timing/severity/associated sxs/prior Treatment) HPI Comments: [name] is a 43 y.o. female with a PMHx of DM2, HTN, neuropathy, anxiety, and depression, who presents to the ED today with complaints of L shoulder pain after slipping on some sunless tanner lotion in her bathtub at 10:15am. Patient states her left shoulder hit the edge of the tub. Reports the pain is 8/10, achy, constant, located in her anterior and lateral shoulder, nonradiating, worse with movements of her arm, and with no alleviating factors given that she did not try anything prior to arrival. States that she has no other injuries, including no neck pain, headaches, blurry vision, syncope, dizziness, chest pain, shortness of breath, abdominal pain, nausea, vomiting, back pain, or cauda equina symptoms. No paresthesias or weakness.  Patient is a 43 y.o. female presenting with fall and shoulder injury. The history is provided by the patient. No language interpreter was used.  Fall This is a new problem. The current episode started today. Associated symptoms include arthralgias (L shoulder). Pertinent negatives include no abdominal pain, chest pain, headaches, joint swelling, myalgias, nausea, neck pain, numbness, vertigo, visual change, vomiting or weakness. Nothing aggravates the symptoms. She has tried nothing for the symptoms.  Shoulder Injury This is a new problem. The current episode started today. The problem has been unchanged. Associated symptoms include arthralgias (L shoulder). Pertinent negatives include no abdominal pain, chest pain, headaches, joint swelling, myalgias, nausea, neck pain, numbness, vertigo, visual change, vomiting or weakness. Exacerbated by: movement of L  shoulder. She has tried nothing for the symptoms. The treatment provided no relief.    Past Medical History  Diagnosis Date  . Diabetes mellitus without complication   . Hypertension   . High cholesterol   . Neuropathy   . PCOS (polycystic ovarian syndrome)   . Anxiety   . Depression    Past Surgical History  Procedure Laterality Date  . Elbow surgery     Family History  Problem Relation Age of Onset  . Depression Mother   . Anxiety disorder Mother    History  Substance Use Topics  . Smoking status: Former Smoker    Types: Cigarettes  . Smokeless tobacco: Not on file  . Alcohol Use: Yes     Comment: occasional   OB History   Grav Para Term Preterm Abortions TAB SAB Ect Mult Living                 Review of Systems  Eyes: Negative for visual disturbance.  Respiratory: Negative for shortness of breath.   Cardiovascular: Negative for chest pain.  Gastrointestinal: Negative for nausea, vomiting and abdominal pain.  Musculoskeletal: Positive for arthralgias (L shoulder). Negative for back pain, joint swelling, myalgias, neck pain and neck stiffness.  Skin: Negative for wound.  Neurological: Negative for dizziness, vertigo, syncope, weakness, light-headedness, numbness and headaches.  Hematological: Does not bruise/bleed easily.  10 Systems reviewed and are negative for acute change except as noted in the HPI.     Allergies  Review of patient's allergies indicates no known allergies.  Home Medications   Prior to Admission medications   Medication Sig Start Date End Date Taking? Authorizing Provider  escitalopram (LEXAPRO) 20 MG tablet Take 20 mg by mouth daily.  Yes Historical Provider, MD  famotidine (PEPCID) 40 MG tablet Take 40 mg by mouth 2 (two) times daily.   Yes Historical Provider, MD  gabapentin (NEURONTIN) 300 MG capsule Take 600 mg by mouth 3 (three) times daily.   Yes Historical Provider, MD  hydrochlorothiazide (HYDRODIURIL) 25 MG tablet Take 25 mg  by mouth daily.   Yes Historical Provider, MD  lithium 300 MG tablet Take 300 mg by mouth 2 (two) times daily.   Yes Historical Provider, MD  nortriptyline (PAMELOR) 10 MG capsule Take 10 mg by mouth at bedtime.   Yes Historical Provider, MD  traZODone (DESYREL) 50 MG tablet Take 50 mg by mouth at bedtime.   Yes Historical Provider, MD  zolpidem (AMBIEN CR) 12.5 MG CR tablet Take 12.5 mg by mouth at bedtime as needed. For sleep 08/05/13  Yes Historical Provider, MD  HYDROcodone-acetaminophen (NORCO) 5-325 MG per tablet Take 1-2 tablets by mouth every 6 (six) hours as needed for severe pain. 09/04/13   Angad Nabers Strupp Camprubi-Soms, PA-C  naproxen (NAPROSYN) 500 MG tablet Take 1 tablet (500 mg total) by mouth 2 (two) times daily as needed for mild pain, moderate pain or headache (TAKE WITH MEALS.). 09/04/13   Aaira Oestreicher Strupp Camprubi-Soms, PA-C   BP 130/76  Pulse 99  Temp(Src) 98.3 F (36.8 C) (Oral)  Resp 16  SpO2 99%  LMP 08/07/2013 Physical Exam  Nursing note and vitals reviewed. Constitutional: She is oriented to person, place, and time. Vital signs are normal. She appears well-developed and well-nourished. No distress.  HENT:  Head: Normocephalic and atraumatic.  Mouth/Throat: Mucous membranes are normal.  Eyes: Conjunctivae and EOM are normal. Right eye exhibits no discharge. Left eye exhibits no discharge.  Neck: Normal range of motion. Neck supple. No spinous process tenderness and no muscular tenderness present. No rigidity. No erythema and normal range of motion present.  FROM intact without spinous process or paraspinous muscle TTP, no bony stepoffs or deformities, no muscle spasms. No rigidity or meningeal signs. No bruising or swelling.  Cardiovascular: Normal rate and intact distal pulses.   Distal pulses intact  Pulmonary/Chest: Effort normal. No respiratory distress. She exhibits no tenderness.  Nontender, no bruising  Abdominal: Normal appearance. She exhibits no distension.   Musculoskeletal:       Left shoulder: She exhibits decreased range of motion (due to pain) and tenderness. She exhibits no bony tenderness, no swelling, no effusion, no crepitus, no deformity, no spasm, normal pulse and normal strength.  Decreased L shoulder ROM secondary to pain. Mild tenderness to palpation over a.c. Joint deltoid muscle. No bruising or deformity, no swelling or effusion, no crepitus or spasm. No bony tenderness. Negative Apley scratch test. Pain with external rotation against resistance, no pain with internal rotation against resistance. Negative drop arm test. Negative Hawkins. Strength 5/5 in all extremities. Sensation grossly intact. No spinous process or paraspinous muscle tenderness to palpation or spasm noted throughout  Neurological: She is alert and oriented to person, place, and time. She has normal strength. No sensory deficit.  Skin: Skin is warm, dry and intact. No bruising and no rash noted.  Psychiatric: She has a normal mood and affect.    ED Course  Procedures (including critical care time) Labs Review Labs Reviewed - No data to display  Imaging Review Dg Shoulder Left  09/04/2013   CLINICAL DATA:  Fall.  Shoulder injury  EXAM: LEFT SHOULDER - 2+ VIEW  COMPARISON:  None.  FINDINGS: Mild degenerative change in spurring involving  the [facility] joint. Glenohumeral joint normal  Normal alignment.  Small calcification above the greater tuberosity appears represent calcific tendinitis. No definite fracture.  IMPRESSION: AC degenerative change.  Calcific tendinitis in the rotator cuff  Negative for fracture.   Electronically Signed   By: Marlan Palau M.D.   On: 09/04/2013 11:17     EKG Interpretation None      MDM   Final diagnoses:  Shoulder pain, acute, left  AC (acromioclavicular) joint arthritis    43y/o female with L shoulder pain after a slip and fall in the tub. Some component of rotator cuff pain, although most TTP along AC joint area. Xray showing  calcific tendinitis in rotator cuff and AC joint degenerative changes, but no acute changes. Pain meds given here with relief. Applied sling, instructed on proper care, and for no more than 3 days then perform ROM exercises. Discussed f/up with PCP in 1wk, and if in 2-3wks she wasn't improving, to see ortho. Rx for pain control given. I explained the diagnosis and have given explicit precautions to return to the ER including for any other new or worsening symptoms. The patient understands and accepts the medical plan as it's been dictated and I have answered their questions. Discharge instructions concerning home care and prescriptions have been given. The patient is STABLE and is discharged to home in good condition.   BP 130/76  Pulse 99  Temp(Src) 98.3 F (36.8 C) (Oral)  Resp 16  SpO2 99%  LMP 08/07/2013  Meds ordered this encounter  Medications  . HYDROcodone-acetaminophen (NORCO/VICODIN) 5-325 MG per tablet 1 tablet    Sig:   . HYDROcodone-acetaminophen (NORCO) 5-325 MG per tablet    Sig: Take 1-2 tablets by mouth every 6 (six) hours as needed for severe pain.    Dispense:  6 tablet    Refill:  0    Order Specific Question:  Supervising Provider    Answer:  Eber Hong D [[id number]]  . naproxen (NAPROSYN) 500 MG tablet    Sig: Take 1 tablet (500 mg total) by mouth 2 (two) times daily as needed for mild pain, moderate pain or headache (TAKE WITH MEALS.).    Dispense:  20 tablet    Refill:  0    Order Specific Question:  Supervising Provider    Answer:  Vida Roller [address] Camprubi-Soms, PA-C 09/04/13 1205

## 2013-09-05 ENCOUNTER — Other Ambulatory Visit (HOSPITAL_COMMUNITY): Payer: BC Managed Care – PPO

## 2013-09-06 NOTE — ED Provider Notes (Signed)
Medical screening examination/treatment/procedure(s) were performed by non-physician practitioner and as supervising physician I was immediately available for consultation/collaboration.   EKG Interpretation None        Sharrieff Spratlin, MD 09/06/13 1418 

## 2013-09-09 ENCOUNTER — Other Ambulatory Visit (HOSPITAL_COMMUNITY): Payer: BC Managed Care – PPO

## 2013-09-10 ENCOUNTER — Other Ambulatory Visit (HOSPITAL_COMMUNITY): Payer: BC Managed Care – PPO

## 2013-09-11 ENCOUNTER — Other Ambulatory Visit (HOSPITAL_COMMUNITY): Payer: BC Managed Care – PPO

## 2013-09-12 ENCOUNTER — Inpatient Hospital Stay (HOSPITAL_COMMUNITY)
Admission: EM | Admit: 2013-09-12 | Discharge: 2013-09-16 | DRG: 917 | Disposition: A | Discharge status: Other Acute Inpatient Hospital | Payer: BC Managed Care – PPO | Attending: Internal Medicine | Admitting: Internal Medicine

## 2013-09-12 ENCOUNTER — Other Ambulatory Visit (HOSPITAL_COMMUNITY): Payer: BC Managed Care – PPO

## 2013-09-12 ENCOUNTER — Encounter (HOSPITAL_COMMUNITY): Payer: Self-pay | Admitting: Emergency Medicine

## 2013-09-12 DIAGNOSIS — Z23 Encounter for immunization: Secondary | ICD-10-CM

## 2013-09-12 DIAGNOSIS — E878 Other disorders of electrolyte and fluid balance, not elsewhere classified: Secondary | ICD-10-CM | POA: Diagnosis present

## 2013-09-12 DIAGNOSIS — G929 Unspecified toxic encephalopathy: Secondary | ICD-10-CM | POA: Diagnosis present

## 2013-09-12 DIAGNOSIS — E1310 Other specified diabetes mellitus with ketoacidosis without coma: Secondary | ICD-10-CM | POA: Diagnosis present

## 2013-09-12 DIAGNOSIS — Z794 Long term (current) use of insulin: Secondary | ICD-10-CM

## 2013-09-12 DIAGNOSIS — E282 Polycystic ovarian syndrome: Secondary | ICD-10-CM | POA: Diagnosis present

## 2013-09-12 DIAGNOSIS — R7309 Other abnormal glucose: Secondary | ICD-10-CM | POA: Diagnosis present

## 2013-09-12 DIAGNOSIS — F411 Generalized anxiety disorder: Secondary | ICD-10-CM | POA: Diagnosis present

## 2013-09-12 DIAGNOSIS — E119 Type 2 diabetes mellitus without complications: Secondary | ICD-10-CM

## 2013-09-12 DIAGNOSIS — T43294A Poisoning by other antidepressants, undetermined, initial encounter: Principal | ICD-10-CM | POA: Diagnosis present

## 2013-09-12 DIAGNOSIS — T50902A Poisoning by unspecified drugs, medicaments and biological substances, intentional self-harm, initial encounter: Secondary | ICD-10-CM | POA: Diagnosis present

## 2013-09-12 DIAGNOSIS — T50902S Poisoning by unspecified drugs, medicaments and biological substances, intentional self-harm, sequela: Secondary | ICD-10-CM

## 2013-09-12 DIAGNOSIS — T7421XA Adult sexual abuse, confirmed, initial encounter: Secondary | ICD-10-CM | POA: Diagnosis present

## 2013-09-12 DIAGNOSIS — T438X2A Poisoning by other psychotropic drugs, intentional self-harm, initial encounter: Secondary | ICD-10-CM | POA: Diagnosis present

## 2013-09-12 DIAGNOSIS — I10 Essential (primary) hypertension: Secondary | ICD-10-CM | POA: Diagnosis present

## 2013-09-12 DIAGNOSIS — T50902D Poisoning by unspecified drugs, medicaments and biological substances, intentional self-harm, subsequent encounter: Secondary | ICD-10-CM

## 2013-09-12 DIAGNOSIS — F3130 Bipolar disorder, current episode depressed, mild or moderate severity, unspecified: Secondary | ICD-10-CM | POA: Diagnosis present

## 2013-09-12 DIAGNOSIS — E785 Hyperlipidemia, unspecified: Secondary | ICD-10-CM | POA: Diagnosis present

## 2013-09-12 DIAGNOSIS — T43502A Poisoning by unspecified antipsychotics and neuroleptics, intentional self-harm, initial encounter: Secondary | ICD-10-CM | POA: Diagnosis present

## 2013-09-12 DIAGNOSIS — R739 Hyperglycemia, unspecified: Secondary | ICD-10-CM

## 2013-09-12 DIAGNOSIS — F4310 Post-traumatic stress disorder, unspecified: Secondary | ICD-10-CM | POA: Diagnosis present

## 2013-09-12 DIAGNOSIS — Z79899 Other long term (current) drug therapy: Secondary | ICD-10-CM

## 2013-09-12 DIAGNOSIS — T50912A Poisoning by multiple unspecified drugs, medicaments and biological substances, intentional self-harm, initial encounter: Secondary | ICD-10-CM

## 2013-09-12 DIAGNOSIS — Z87891 Personal history of nicotine dependence: Secondary | ICD-10-CM | POA: Diagnosis not present

## 2013-09-12 DIAGNOSIS — T7421XS Adult sexual abuse, confirmed, sequela: Secondary | ICD-10-CM

## 2013-09-12 DIAGNOSIS — G92 Toxic encephalopathy: Secondary | ICD-10-CM | POA: Diagnosis present

## 2013-09-12 DIAGNOSIS — E876 Hypokalemia: Secondary | ICD-10-CM | POA: Diagnosis present

## 2013-09-12 DIAGNOSIS — T43011A Poisoning by tricyclic antidepressants, accidental (unintentional), initial encounter: Secondary | ICD-10-CM | POA: Diagnosis present

## 2013-09-12 DIAGNOSIS — F314 Bipolar disorder, current episode depressed, severe, without psychotic features: Secondary | ICD-10-CM | POA: Diagnosis present

## 2013-09-12 DIAGNOSIS — IMO0002 Reserved for concepts with insufficient information to code with codable children: Secondary | ICD-10-CM | POA: Diagnosis present

## 2013-09-12 DIAGNOSIS — T50901A Poisoning by unspecified drugs, medicaments and biological substances, accidental (unintentional), initial encounter: Secondary | ICD-10-CM

## 2013-09-12 DIAGNOSIS — F419 Anxiety disorder, unspecified: Secondary | ICD-10-CM

## 2013-09-12 LAB — BASIC METABOLIC PANEL
Anion gap: 12 (ref 5–15)
BUN: 8 mg/dL (ref 6–23)
CHLORIDE: 103 meq/L (ref 96–112)
CO2: 28 mEq/L (ref 19–32)
Calcium: 8.5 mg/dL (ref 8.4–10.5)
Creatinine, Ser: 0.49 mg/dL — ABNORMAL LOW (ref 0.50–1.10)
GFR calc non Af Amer: >90 mL/min (ref >90)
Glucose, Bld: 170 mg/dL — ABNORMAL HIGH (ref 70–99)
Potassium: 3.2 mEq/L — ABNORMAL LOW (ref 3.7–5.3)
SODIUM: 143 meq/L (ref 137–147)

## 2013-09-12 LAB — COMPREHENSIVE METABOLIC PANEL
ALK PHOS: 75 U/L (ref 39–117)
ALT: 16 U/L (ref 0–35)
AST: 20 U/L (ref 0–37)
Albumin: 3.2 g/dL — ABNORMAL LOW (ref 3.5–5.2)
Anion gap: 16 — ABNORMAL HIGH (ref 5–15)
BUN: 10 mg/dL (ref 6–23)
CO2: 24 meq/L (ref 19–32)
Calcium: 9.3 mg/dL (ref 8.4–10.5)
Chloride: 89 mEq/L — ABNORMAL LOW (ref 96–112)
Creatinine, Ser: 0.51 mg/dL (ref 0.50–1.10)
GLUCOSE: 571 mg/dL — AB (ref 70–99)
POTASSIUM: 4.2 meq/L (ref 3.7–5.3)
SODIUM: 129 meq/L — AB (ref 137–147)
Total Bilirubin: 0.3 mg/dL (ref 0.3–1.2)
Total Protein: 7.6 g/dL (ref 6.0–8.3)

## 2013-09-12 LAB — CBC WITH DIFFERENTIAL/PLATELET
Basophils Absolute: 0.1 10*3/uL (ref 0.0–0.1)
Basophils Relative: 1 % (ref 0–1)
EOS ABS: 0.1 10*3/uL (ref 0.0–0.7)
Eosinophils Relative: 1 % (ref 0–5)
HCT: 40.6 % (ref 36.0–46.0)
Hemoglobin: 14.8 g/dL (ref 12.0–15.0)
Lymphocytes Relative: 20 % (ref 12–46)
Lymphs Abs: 2 10*3/uL (ref 0.7–4.0)
MCH: 30.5 pg (ref 26.0–34.0)
MCHC: 36.5 g/dL — ABNORMAL HIGH (ref 30.0–36.0)
MCV: 83.5 fL (ref 78.0–100.0)
MONOS PCT: 6 % (ref 3–12)
Monocytes Absolute: 0.6 10*3/uL (ref 0.1–1.0)
NEUTROS PCT: 72 % (ref 43–77)
Neutro Abs: 7.6 10*3/uL (ref 1.7–7.7)
PLATELETS: 338 10*3/uL (ref 150–400)
RBC: 4.86 MIL/uL (ref 3.87–5.11)
RDW: 12.5 % (ref 11.5–15.5)
WBC: 10.4 10*3/uL (ref 4.0–10.5)

## 2013-09-12 LAB — CBG MONITORING, ED
Glucose-Capillary: 592 mg/dL (ref 70–99)
Glucose-Capillary: 442 mg/dL — ABNORMAL HIGH (ref 70–99)
Glucose-Capillary: 324 mg/dL — ABNORMAL HIGH (ref 70–99)

## 2013-09-12 LAB — RAPID URINE DRUG SCREEN, HOSP PERFORMED
Amphetamines: NOT DETECTED
Barbiturates: NOT DETECTED
Benzodiazepines: NOT DETECTED
COCAINE: NOT DETECTED
OPIATES: NOT DETECTED
TETRAHYDROCANNABINOL: NOT DETECTED

## 2013-09-12 LAB — ACETAMINOPHEN LEVEL: Acetaminophen (Tylenol), Serum: <15 ug/mL (ref 10–30)

## 2013-09-12 LAB — LITHIUM LEVEL: Lithium Lvl: <0.25 mEq/L — ABNORMAL LOW (ref 0.80–1.40)

## 2013-09-12 LAB — GLUCOSE, CAPILLARY
GLUCOSE-CAPILLARY: 306 mg/dL — AB (ref 70–99)
Glucose-Capillary: 205 mg/dL — ABNORMAL HIGH (ref 70–99)

## 2013-09-12 LAB — ETHANOL: Alcohol, Ethyl (B): <11 mg/dL (ref 0–11)

## 2013-09-12 LAB — SALICYLATE LEVEL: Salicylate Lvl: <2 mg/dL — ABNORMAL LOW (ref 2.8–20.0)

## 2013-09-12 LAB — PREGNANCY, URINE: PREG TEST UR: NEGATIVE

## 2013-09-12 LAB — MRSA PCR SCREENING: MRSA by PCR: NEGATIVE

## 2013-09-12 MED ORDER — ENOXAPARIN SODIUM 40 MG/0.4ML ~~LOC~~ SOLN
40.0000 mg | SUBCUTANEOUS | Status: DC
  Administered 2013-09-12 – 2013-09-15 (×4): 40 mg via SUBCUTANEOUS
  Filled 2013-09-12 (×5): qty 0.4

## 2013-09-12 MED ORDER — INFLUENZA VAC SPLIT QUAD 0.5 ML IM SUSY
0.5000 mL | PREFILLED_SYRINGE | INTRAMUSCULAR | Status: AC
  Administered 2013-09-14: 0.5 mL via INTRAMUSCULAR
  Filled 2013-09-12 (×3): qty 0.5

## 2013-09-12 MED ORDER — HYDROCODONE-ACETAMINOPHEN 5-325 MG PO TABS: 1.0000 | ORAL_TABLET | Freq: Four times a day (QID) | ORAL | Status: DC | PRN

## 2013-09-12 MED ORDER — DEXTROSE-NACL 5-0.45 % IV SOLN: INTRAVENOUS | Status: DC

## 2013-09-12 MED ORDER — LORAZEPAM 2 MG/ML IJ SOLN: 2.0000 mg | INTRAMUSCULAR | Status: DC | PRN

## 2013-09-12 MED ORDER — DOCUSATE SODIUM 100 MG PO CAPS
100.0000 mg | ORAL_CAPSULE | Freq: Two times a day (BID) | ORAL | Status: DC
  Administered 2013-09-12 – 2013-09-16 (×7): 100 mg via ORAL
  Filled 2013-09-12 (×9): qty 1

## 2013-09-12 MED ORDER — SODIUM CHLORIDE 0.9 % IV SOLN
INTRAVENOUS | Status: DC
  Administered 2013-09-12: 2.2 [IU]/h via INTRAVENOUS
  Administered 2013-09-12: 4.9 [IU]/h via INTRAVENOUS
  Administered 2013-09-13: 1.9 [IU]/h via INTRAVENOUS

## 2013-09-12 MED ORDER — FAMOTIDINE 40 MG PO TABS
40.0000 mg | ORAL_TABLET | Freq: Two times a day (BID) | ORAL | Status: DC
  Administered 2013-09-12 – 2013-09-16 (×8): 40 mg via ORAL
  Filled 2013-09-12 (×2): qty 1
  Filled 2013-09-12: qty 2
  Filled 2013-09-12 (×3): qty 1
  Filled 2013-09-12 (×3): qty 2

## 2013-09-12 MED ORDER — SODIUM CHLORIDE 0.9 % IV SOLN
INTRAVENOUS | Status: DC
  Administered 2013-09-12: 100 mL/h via INTRAVENOUS

## 2013-09-12 MED ORDER — SODIUM CHLORIDE 0.9 % IV BOLUS (SEPSIS)
1000.0000 mL | Freq: Once | INTRAVENOUS | Status: AC
  Administered 2013-09-12: 1000 mL via INTRAVENOUS

## 2013-09-12 MED ORDER — POTASSIUM CHLORIDE CRYS ER 20 MEQ PO TBCR
30.0000 meq | EXTENDED_RELEASE_TABLET | Freq: Once | ORAL | Status: AC
  Administered 2013-09-12: 30 meq via ORAL
  Filled 2013-09-12 (×2): qty 1

## 2013-09-12 MED ORDER — POLYETHYLENE GLYCOL 3350 17 G PO PACK
17.0000 g | PACK | Freq: Every day | ORAL | Status: DC | PRN
  Filled 2013-09-12: qty 1

## 2013-09-12 MED ORDER — INSULIN REGULAR BOLUS VIA INFUSION
0.0000 [IU] | Freq: Three times a day (TID) | INTRAVENOUS | Status: DC
  Filled 2013-09-12: qty 10

## 2013-09-12 MED ORDER — SODIUM CHLORIDE 0.9 % IV SOLN: INTRAVENOUS | Status: DC

## 2013-09-12 MED ORDER — BISACODYL 5 MG PO TBEC: 5.0000 mg | DELAYED_RELEASE_TABLET | Freq: Every day | ORAL | Status: DC | PRN

## 2013-09-12 MED ORDER — ACETAMINOPHEN 650 MG RE SUPP: 650.0000 mg | Freq: Four times a day (QID) | RECTAL | Status: DC | PRN

## 2013-09-12 MED ORDER — INSULIN GLARGINE 100 UNIT/ML ~~LOC~~ SOLN
10.0000 [IU] | Freq: Every day | SUBCUTANEOUS | Status: DC
  Administered 2013-09-12 – 2013-09-13 (×2): 10 [IU] via SUBCUTANEOUS
  Filled 2013-09-12 (×2): qty 0.1

## 2013-09-12 MED ORDER — SODIUM CHLORIDE 0.9 % IV SOLN
INTRAVENOUS | Status: DC
  Administered 2013-09-12: 3.8 [IU]/h via INTRAVENOUS
  Filled 2013-09-12: qty 2.5

## 2013-09-12 MED ORDER — DEXTROSE 50 % IV SOLN: 25.0000 mL | INTRAVENOUS | Status: DC | PRN

## 2013-09-12 MED ORDER — DEXTROSE-NACL 5-0.45 % IV SOLN
INTRAVENOUS | Status: DC
  Administered 2013-09-12: 22:00:00 via INTRAVENOUS

## 2013-09-12 MED ORDER — ACETAMINOPHEN 325 MG PO TABS: 650.0000 mg | ORAL_TABLET | Freq: Four times a day (QID) | ORAL | Status: DC | PRN

## 2013-09-12 NOTE — H&P (Addendum)
[facility] History and Physical  Verginia Toohey ZOX:[id number] DOB: [birth year] DOA: 09/12/2013  Referring physician:  Marlon Pel PCP:  Juline Patch, MD   Chief Complaint:  Drug overdose, suicide attempt  HPI:  The patient is a 43 y.o. year-old female with history of uncontrolled depression with numerous suicide attempts and admissions to [facility], uncontrolled T2DM, HTN, HLD, PCOS who presents with intentional drug overdose.  The patient is currently a poor historian and slurring her words, talking softly and not able to answer many of my questions directly so most of the history is taken from EMS and ER notes/report.  Patient states she was "not feeling right" this AM but cannot specify in what way.  Denies fevers, chills, cough, nausea, vomiting, diarrhea, dysuria.  She declined to discuss what prompted her suicide attempt.  She confirms she took 40 lexapro  tabs and 40 nortryiptyline  tabs and then called her boyfriend who notified EMS.  She denies taking any other OTC or prescription medications.  She refused to open the door for EMS but eventually they were able to transport her to the ER.    In the ER, mild tachycardia to low 100s and BP 96/68, breathing on RA.  Labs notable for glucose of 571 with mild gap of 16 and normal bicarb.  Sodium 129.  UDS, tylenol, ASA, EtOH levels negative.  She was started on glucose stabilizer and poison control notified.    Review of Systems:  Limited due to patient's metabolic encephalopathy from drug overdose.   General:  Denies fevers, chills HEENT:  Denies sinus congestion, sore throat CV:  Denies chest pain  PULM:  Denies SOB, cough.   GI:  Denies nausea, vomiting, diarrhea.   GU:  Denies dysuria NEURO:  "not right"  PSYCH:  Positive anxiety and depression.    Past Medical History  Diagnosis Date  . Diabetes mellitus without complication   . Hypertension   . High cholesterol   . Neuropathy   . PCOS (polycystic ovarian syndrome)    . Anxiety   . Depression    Past Surgical History  Procedure Laterality Date  . Elbow surgery     Social History:  reports that she quit smoking about 13 years ago. Her smoking use included Cigarettes. She smoked 0.00 packs per day. She has never used smokeless tobacco. She reports that she drinks alcohol. She reports that she uses illicit drugs (Marijuana).  No Known Allergies  Family History  Problem Relation Age of Onset  . Depression Mother   . Anxiety disorder Mother      Prior to Admission medications   Medication Sig Start Date End Date Taking? Authorizing Provider  escitalopram (LEXAPRO) 20 MG tablet Take 20 mg by mouth daily.   Yes Historical Provider, MD  famotidine (PEPCID) 40 MG tablet Take 40 mg by mouth 2 (two) times daily.   Yes Historical Provider, MD  gabapentin (NEURONTIN) 300 MG capsule Take 600 mg by mouth 3 (three) times daily.   Yes Historical Provider, MD  hydrochlorothiazide (HYDRODIURIL) 25 MG tablet Take 25 mg by mouth daily.   Yes Historical Provider, MD  lithium 300 MG tablet Take 300 mg by mouth 2 (two) times daily.   Yes Historical Provider, MD  naproxen (NAPROSYN) 500 MG tablet Take 1 tablet (500 mg total) by mouth 2 (two) times daily as needed for mild pain, moderate pain or headache (TAKE WITH MEALS.). 09/04/13  Yes Mercedes Strupp Camprubi-Soms, PA-C  nortriptyline (PAMELOR) 10 MG capsule Take 10  mg by mouth at bedtime.   Yes Historical Provider, MD  traZODone (DESYREL) 50 MG tablet Take 50 mg by mouth at bedtime.   Yes Historical Provider, MD  zolpidem (AMBIEN CR) 12.5 MG CR tablet Take 12.5 mg by mouth at bedtime as needed. For sleep 08/05/13  Yes Historical Provider, MD  HYDROcodone-acetaminophen (NORCO) 5-325 MG per tablet Take 1-2 tablets by mouth every 6 (six) hours as needed for severe pain. 09/04/13   Mercedes Strupp Camprubi-Soms, PA-C   Physical Exam: Filed Vitals:   09/12/13 1730 09/12/13 1745 09/12/13 1800 09/12/13 1816  BP:  103/67  Pulse: 101   103  Temp:      TempSrc:      Resp: SpO2: 96%   99%     General:  CF, NAD, eyes closed but able to open on command.  Verbal, but talking in soft frequently inaudible mumbled voice, tangential and at times nonsensical  Eyes:  PERRL, anicteric, non-injected.  ENT:  Nares clear.  OP clear, non-erythematous without plaques or exudates.  MMM.  Neck:  Supple without TM or JVD.    Lymph:  No cervical, supraclavicular, or submandibular LAD.  Cardiovascular:  Tachycardic RR, normal S1, S2, without m/r/g.  2+ pulses, warm extremities  Respiratory:  CTA bilaterally without increased WOB.  Abdomen:  NABS.  Soft, ND/NT.    Skin:  No rashes or focal lesions.  mutiple linear scars on hands and legs  Musculoskeletal:  Normal bulk and tone.  No LE edema.  Psychiatric:  A & O x 4.  Flat affect. Neurologic: no tremor or tongue fasciculations, grossly moves all extremities  Labs on Admission:  Basic Metabolic Panel:  Recent Labs Lab 09/12/13 1653  NA 129*  K 4.2  CL 89*  CO2 24  GLUCOSE 571*  BUN 10  CREATININE 0.51  CALCIUM 9.3   Liver Function Tests:  Recent Labs Lab 09/12/13 1653  AST 20  ALT 16  ALKPHOS 75  BILITOT 0.3  PROT 7.6  ALBUMIN 3.2*   No results found for this basename: LIPASE, AMYLASE,  in the last 168 hours No results found for this basename: AMMONIA,  in the last 168 hours CBC:  Recent Labs Lab 09/12/13 1653  WBC 10.4  NEUTROABS 7.6  HGB 14.8  HCT 40.6  MCV 83.5  PLT 338   Cardiac Enzymes: No results found for this basename: CKTOTAL, CKMB, CKMBINDEX, TROPONINI,  in the last 168 hours  BNP (last 3 results) No results found for this basename: PROBNP,  in the last 8760 hours CBG:  Recent Labs Lab 09/12/13 1715  GLUCAP 592*    Radiological Exams on Admission: No results found.  EKG: Independently reviewed. Sinus tachycardia, QTc 520 msec  Assessment/Plan Active Problems:   * No active hospital  problems. *  ---  Suicide attempt/drug overdose, Per poison control, the lexapro requires telemetry monitoring for approximately 24 hours and electrolyte monitoring in AM.  For notriptyline, she is at risk for tachycardia and hypertension followed by sudden hypotension and seizures.  They recommended against treating hypertension for the next 24 hours.  Seizures should be treated with ativan.  Avoid haldol and geodon.  Monitor for sedation and respiratory suppression.   -  Stepdown -  Telemetry -  Check electrolytes in AM -  IVF -  Ativan prn -  Hold sedating medications for now -  Lithium level is low at time of admission.  Repeat in AM  and if still low, okay to resume lithium -  Seizure precautions -  Sitter/suicide precautions -  Psychiatry consultation:  Called consult this evening and should be seen tomorrow  DM type 2, uncontrolled, A1c 13 two months ago  -  Insulin gtt and IVF -  Repeat BMP q2h x 4 and transition to subcutaneous insulin once gap closed and glucose stable.    HTN, currently borderline hypotensive -  Hold HCTZ  Prolonged QTc -  Telemetry -  Check magnesium -  Minimize QT prolonging medications  Pseudohyponatremia due to hyperglycemia:  Corrects to 137  Hypochloremia may be due to dehydration -  IVF -  Repeat BMP in AM  Diet:  Diabetic Access:  PIV IVF:  yes Proph:  lovenox  Code Status: full Family Communication: patient alone Disposition Plan: Admit to stepdown  Time spent: 60 min Renae Fickle [facility] Pager [phone]  If 7PM-7AM, please contact night-coverage [email or web address] Password [facility] 09/12/2013, 6:29 PM

## 2013-09-12 NOTE — ED Notes (Signed)
Called poison control, spoke to [place] who recommended to keep pt for 24 hours observation. Charcoal is not recommended since it has been more than 2 hours since pt"s ingestion of the two medication. Angelique Blonder stated that these meds can cause sedation and ECG changes. To check electrolytes levels K and Mg. To avoid Geodon and Haldol. If seizure, administer Benzodiazapine.

## 2013-09-12 NOTE — ED Provider Notes (Signed)
Medical screening examination/treatment/procedure(s) were performed by non-physician practitioner and as supervising physician I was immediately available for consultation/collaboration.   EKG Interpretation   Date/Time:  Friday September 12 2013 17:09:17 EDT Ventricular Rate:  102 PR Interval:  184 QRS Duration: 93 QT Interval:  399 QTC Calculation: 520 R Axis:   63 Text Interpretation:  Sinus tachycardia Probable left atrial enlargement  Low voltage with right axis deviation Probable anteroseptal infarct, old  Prolonged QT interval Confirmed by Rubin Payor  MD, Eloise Mula [phone]) on  09/12/2013 11:21:24 PM       Juliet Rude. Rubin Payor, MD 09/12/13 2356

## 2013-09-12 NOTE — ED Notes (Signed)
Glucose 571 critical result called from lab.

## 2013-09-12 NOTE — ED Notes (Signed)
Per EMS, pt from home.  Pt states to have taken 40 lexapro 10 mg and 40 nortriptyline 25 mg.  Pt took meds and then called boyfriend  He called EMS. Pt would not open door for [employer].  Pt admits to purposeful attempt.  Pt diabetic and not taking insulin.  CBG 524.  Hr 110, bp 160/110,  resp 16.  No vomiting on scene.  Unable to obtain IV in route.  Marland Kitchen

## 2013-09-12 NOTE — ED Notes (Signed)
Marlon Pel PA notified of pt's CBG.

## 2013-09-12 NOTE — ED Notes (Signed)
Pt. and belongings wanded by security 

## 2013-09-12 NOTE — ED Provider Notes (Signed)
CSN: [id number]     Arrival date & time 09/12/13  1622 History   First MD Initiated Contact with Patient 09/12/13 1637     Chief Complaint  Patient presents with  . Drug Overdose     (Consider location/radiation/quality/duration/timing/severity/associated sxs/prior Treatment) HPI Patient to the ER by EMS for medication overdose. She reports taking 40 x 10 mg Lexapro and 40 x 25 mg Nortriptyline at 2:30pm this evening. She reports doing it intentionally but not forth coming as to why. She has a flat affect and says that she did this before a couple of times.  She is a diabetic and does not take her medications. Reports that she feels funny and started to feel strange in her head so she called her boyfriend who in turn called EMS.    Past Medical History  Diagnosis Date  . Diabetes mellitus without complication   . Hypertension   . High cholesterol   . Neuropathy   . PCOS (polycystic ovarian syndrome)   . Anxiety   . Depression    Past Surgical History  Procedure Laterality Date  . Elbow surgery     Family History  Problem Relation Age of Onset  . Depression Mother   . Anxiety disorder Mother    History  Substance Use Topics  . Smoking status: Former Smoker    Types: Cigarettes    Quit date: 09/12/2000  . Smokeless tobacco: Never Used  . Alcohol Use: Yes     Comment: occasional   OB History   Grav Para Term Preterm Abortions TAB SAB Ect Mult Living                 Review of Systems  Level V caveat- intentional drug overdose  Allergies  Review of patient's allergies indicates no known allergies.  Home Medications   Prior to Admission medications   Medication Sig Start Date End Date Taking? Authorizing Provider  escitalopram (LEXAPRO) 20 MG tablet Take 20 mg by mouth daily.   Yes Historical Provider, MD  famotidine (PEPCID) 40 MG tablet Take 40 mg by mouth 2 (two) times daily.   Yes Historical Provider, MD  gabapentin (NEURONTIN) 300 MG capsule Take 600 mg by  mouth 3 (three) times daily.   Yes Historical Provider, MD  hydrochlorothiazide (HYDRODIURIL) 25 MG tablet Take 25 mg by mouth daily.   Yes Historical Provider, MD  lithium 300 MG tablet Take 300 mg by mouth 2 (two) times daily.   Yes Historical Provider, MD  naproxen (NAPROSYN) 500 MG tablet Take 1 tablet (500 mg total) by mouth 2 (two) times daily as needed for mild pain, moderate pain or headache (TAKE WITH MEALS.). 09/04/13  Yes Mercedes Strupp Camprubi-Soms, PA-C  nortriptyline (PAMELOR) 10 MG capsule Take 10 mg by mouth at bedtime.   Yes Historical Provider, MD  traZODone (DESYREL) 50 MG tablet Take 50 mg by mouth at bedtime.   Yes Historical Provider, MD  zolpidem (AMBIEN CR) 12.5 MG CR tablet Take 12.5 mg by mouth at bedtime as needed. For sleep 08/05/13  Yes Historical Provider, MD  HYDROcodone-acetaminophen (NORCO) 5-325 MG per tablet Take 1-2 tablets by mouth every 6 (six) hours as needed for severe pain. 09/04/13   Mercedes Strupp Camprubi-Soms, PA-C   BP 103/67  Pulse 103  Temp(Src) 97.9 F (36.6 C) (Oral)  Resp 20  SpO2 99%  LMP 09/05/2013 Physical Exam  Nursing note and vitals reviewed. Constitutional: She appears well-developed and well-nourished. No distress.  HENT:  Head:  Normocephalic and atraumatic.  Eyes: Pupils are equal, round, and reactive to light.  Neck: Normal range of motion. Neck supple.  Cardiovascular: Normal rate and regular rhythm.   Pulmonary/Chest: Effort normal.  Abdominal: Soft.  Neurological: She is alert.  Skin: Skin is warm and dry.  Psychiatric: She exhibits a depressed mood. She expresses suicidal ideation. She expresses no homicidal ideation. She expresses suicidal plans. She expresses no homicidal plans.  Flat affect    ED Course  Procedures (including critical care time) Labs Review Labs Reviewed  COMPREHENSIVE METABOLIC PANEL - Abnormal; Notable for the following:    Sodium 129 (*)    Chloride 89 (*)    Glucose, Bld 571 (*)    Albumin  3.2 (*)    Anion gap 16 (*)    All other components within normal limits  CBC WITH DIFFERENTIAL - Abnormal; Notable for the following:    MCHC 36.5 (*)    All other components within normal limits  SALICYLATE LEVEL - Abnormal; Notable for the following:    Salicylate Lvl <2.0 (*)    All other components within normal limits  LITHIUM LEVEL - Abnormal; Notable for the following:    Lithium Lvl <0.25 (*)    All other components within normal limits  CBG MONITORING, ED - Abnormal; Notable for the following:    Glucose-Capillary 592 (*)    All other components within normal limits  URINE RAPID DRUG SCREEN (HOSP PERFORMED)  ETHANOL  PREGNANCY, URINE  ACETAMINOPHEN LEVEL    Imaging Review No results found.   EKG Interpretation None      MDM   Final diagnoses:  Suicide attempt by multiple drug overdose, initial encounter  Hyperglycemia   CRITICAL CARE Performed by: Dorthula Matas Total critical care time: 45 Critical care time was exclusive of separately billable procedures and treating other patients. Critical care was necessary to treat or prevent imminent or life-threatening deterioration. Critical care was time spent personally by me on the following activities: development of treatment plan with patient and/or surrogate as well as nursing, discussions with consultants, evaluation of patient's response to treatment, examination of patient, obtaining history from patient or surrogate, ordering and performing treatments and interventions, ordering and review of laboratory studies, ordering and review of radiographic studies, pulse oximetry and re-evaluation of patient's condition.   4:45 pm- Poison Control recommendations- Dr. Rubin Payor aware of recommendations Lexapro  needs 24 hour obs, delayed seizures and PT prolongation Nortriptyline QRS (140 or greater, start bicarb bolus not drip normally 1-2 amps in adult) prolongation, intubate early if develops difficulty  breathing. Watch for tachycardia and hypertension (don't treat- sometimes they drop really quick after becoming hypertensive). Seizure is also a concern.  EKG shows prolonged QT interval. Her glucose is greater than 500. After discussing with Dr. Rubin Payor, will start on glucostabilizer.  Filed Vitals:   09/12/13 1816  BP: 103/67  Pulse: 103  Temp:   Resp: 20   09/12/13 1716 97.9 F (36.6 C) -- -- -- -- -- -- BSA 09/12/13 1715 -- 101 15 97/65 mmHg 95 % -- -- LSJ  6: 31 pm Pt admitted to [facility], inpatient, Althia Forts,    [place] Vitals:   09/12/13 1816  BP: 103/67  Pulse: 103  Temp:   Resp: [address]      Dorthula Matas, PA-C 09/12/13 1831

## 2013-09-12 NOTE — Progress Notes (Signed)
Utilization Review completed.  Haddie Bruhl RN CM  

## 2013-09-13 DIAGNOSIS — F3130 Bipolar disorder, current episode depressed, mild or moderate severity, unspecified: Secondary | ICD-10-CM

## 2013-09-13 DIAGNOSIS — E876 Hypokalemia: Secondary | ICD-10-CM

## 2013-09-13 DIAGNOSIS — F314 Bipolar disorder, current episode depressed, severe, without psychotic features: Secondary | ICD-10-CM

## 2013-09-13 DIAGNOSIS — F4310 Post-traumatic stress disorder, unspecified: Secondary | ICD-10-CM

## 2013-09-13 DIAGNOSIS — Z5189 Encounter for other specified aftercare: Secondary | ICD-10-CM

## 2013-09-13 LAB — CBC
HCT: 36 % (ref 36.0–46.0)
Hemoglobin: 12.7 g/dL (ref 12.0–15.0)
MCH: 30 pg (ref 26.0–34.0)
MCHC: 35.3 g/dL (ref 30.0–36.0)
MCV: 85.1 fL (ref 78.0–100.0)
PLATELETS: 272 10*3/uL (ref 150–400)
RBC: 4.23 MIL/uL (ref 3.87–5.11)
RDW: 12.6 % (ref 11.5–15.5)
WBC: 8.8 10*3/uL (ref 4.0–10.5)

## 2013-09-13 LAB — GLUCOSE, CAPILLARY
GLUCOSE-CAPILLARY: 171 mg/dL — AB (ref 70–99)
GLUCOSE-CAPILLARY: 205 mg/dL — AB (ref 70–99)
GLUCOSE-CAPILLARY: 251 mg/dL — AB (ref 70–99)
GLUCOSE-CAPILLARY: 321 mg/dL — AB (ref 70–99)
Glucose-Capillary: 154 mg/dL — ABNORMAL HIGH (ref 70–99)
Glucose-Capillary: 155 mg/dL — ABNORMAL HIGH (ref 70–99)
Glucose-Capillary: 168 mg/dL — ABNORMAL HIGH (ref 70–99)
Glucose-Capillary: 169 mg/dL — ABNORMAL HIGH (ref 70–99)
Glucose-Capillary: 242 mg/dL — ABNORMAL HIGH (ref 70–99)

## 2013-09-13 LAB — BASIC METABOLIC PANEL
ANION GAP: 13 (ref 5–15)
BUN: 6 mg/dL (ref 6–23)
CHLORIDE: 99 meq/L (ref 96–112)
CO2: 27 mEq/L (ref 19–32)
Calcium: 8 mg/dL — ABNORMAL LOW (ref 8.4–10.5)
Creatinine, Ser: 0.41 mg/dL — ABNORMAL LOW (ref 0.50–1.10)
GFR calc non Af Amer: >90 mL/min (ref >90)
Glucose, Bld: 188 mg/dL — ABNORMAL HIGH (ref 70–99)
POTASSIUM: 3 meq/L — AB (ref 3.7–5.3)
SODIUM: 139 meq/L (ref 137–147)

## 2013-09-13 LAB — MAGNESIUM
MAGNESIUM: 2.1 mg/dL (ref 1.5–2.5)
MAGNESIUM: 2 mg/dL (ref 1.5–2.5)

## 2013-09-13 MED ORDER — INSULIN ASPART 100 UNIT/ML ~~LOC~~ SOLN
2.0000 [IU] | Freq: Once | SUBCUTANEOUS | Status: AC
  Administered 2013-09-13: 2 [IU] via SUBCUTANEOUS

## 2013-09-13 MED ORDER — HALOPERIDOL LACTATE 5 MG/ML IJ SOLN
5.0000 mg | Freq: Once | INTRAMUSCULAR | Status: DC
  Filled 2013-09-13: qty 1

## 2013-09-13 MED ORDER — CETYLPYRIDINIUM CHLORIDE 0.05 % MT LIQD
7.0000 mL | Freq: Two times a day (BID) | OROMUCOSAL | Status: DC
  Administered 2013-09-13 – 2013-09-16 (×5): 7 mL via OROMUCOSAL

## 2013-09-13 MED ORDER — POTASSIUM CHLORIDE IN NACL 40-0.9 MEQ/L-% IV SOLN
INTRAVENOUS | Status: DC
  Administered 2013-09-13 – 2013-09-14 (×3): 100 mL/h via INTRAVENOUS
  Filled 2013-09-13 (×4): qty 1000

## 2013-09-13 MED ORDER — INSULIN ASPART 100 UNIT/ML ~~LOC~~ SOLN
0.0000 [IU] | Freq: Three times a day (TID) | SUBCUTANEOUS | Status: DC
  Administered 2013-09-13: 7 [IU] via SUBCUTANEOUS
  Administered 2013-09-13: 11 [IU] via SUBCUTANEOUS
  Administered 2013-09-13: 7 [IU] via SUBCUTANEOUS

## 2013-09-13 MED ORDER — LORAZEPAM 2 MG/ML IJ SOLN
1.0000 mg | Freq: Once | INTRAMUSCULAR | Status: DC
  Filled 2013-09-13: qty 1

## 2013-09-13 MED ORDER — INSULIN ASPART 100 UNIT/ML ~~LOC~~ SOLN: 0.0000 [IU] | SUBCUTANEOUS | Status: DC

## 2013-09-13 MED ORDER — INSULIN ASPART 100 UNIT/ML ~~LOC~~ SOLN
0.0000 [IU] | Freq: Every day | SUBCUTANEOUS | Status: DC
  Administered 2013-09-13: 4 [IU] via SUBCUTANEOUS
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
## 2013-09-13 NOTE — Progress Notes (Signed)
Progress Note   Stephaie Dardis [name]:[id number] DOB: 1970-06-22 DOA: 09/12/2013 PCP: Juline Patch, MD   Brief Narrative:   [name] is an 43 y.o. female with history of uncontrolled depression with numerous suicide attempts and admissions to [facility], uncontrolled T2DM, HTN, HLD, PCOS who was admitted on 09/12/13 with intentional drug overdose. She took 40 lexapro  tabs and 40 nortryiptyline  tabs and then called her boyfriend who notified EMS.   Assessment/Plan:   Principal Problem:   Suicide attempt by drug ingestion  Admitted to tele/SDU for closer monitoring.  Continue supportive care with IV fluids, holding sedating medications, and seizure precautions.  Continue Recruitment consultant.  Continue Ativan when necessary.  Psychiatry consultation pending.  Active Problems:   DKA Type 2 diabetes mellitus not at goal  Hemoglobin A 1C 13 on last check.  Diabetes coordinator consultation requested.  Initially treated with an insulin drip secondary to severe hyperglycemia and high anion gap but was occluded transitioned to subcutaneous insulin when gap closed. Did not have any metabolic acidosis.  Currently on 10 units of Lantus and insulin resistant SSI q. 4 hours. We'll switch to SSI for meals/bedtime.    Posttraumatic stress disorder  Continue Ativan as needed.    Bipolar affective disorder, depressed, severe  Psychiatry consultation requested.    Hypokalemia  Potassium added to IV fluids.    Hypertension  Blood pressure soft so hydrochlorothiazide currently on hold.    Prolonged QTC   Monitoring on telemetry. Avoid drugs that also prolonged QTC.    DVT Prophylaxis  Continue Lovenox.   Code Status: Full. Family Communication: No family at the bedside. Disposition Plan: Home when stable.   IV Access:    Peripheral IV   Procedures and diagnostic studies:   No results found.   Medical Consultants:    Psychiatry   Other  Consultants:    None.   Anti-Infectives:    None.  Subjective:    [name] is lethargic and will not wake up enough to answer any questions for me. Her sitter states that she has gotten out to the bathroom and has spoke with the nurses in one-word phrases, but has otherwise been sleeping.  Objective:    Filed Vitals:   09/13/13 0000 09/13/13 0200 09/13/13 0400 09/13/13 0600  BP: 100/65 99/63 119/72 119/71  Pulse: 89 80 82 79  Temp: 98.2 F (36.8 C)  98.6 F (37 C)   TempSrc: Axillary  Axillary   Resp: Height:      Weight:      SpO2: 99% 100% 99% 99%    Intake/Output Summary (Last 24 hours) at 09/13/13 0737 Last data filed at 09/13/13 0600  Gross per 24 hour  Intake 958.33 ml  Output    750 ml  Net 208.33 ml    Exam: Gen:  NAD, lethargic Cardiovascular:  RRR, No M/R/G Respiratory:  Lungs CTAB Gastrointestinal:  Abdomen soft, NT/ND, + BS Extremities:  No C/E/C   Data Reviewed:    Labs: Basic Metabolic Panel:  Recent Labs Lab 09/12/13 1653 09/12/13 2111 09/12/13 2150 09/13/13 0355  NA 129* 143  --  139  K 4.2 3.2*  --  3.0*  CL 89* 103  --  99  CO2 24 28  --  27  GLUCOSE 571* 170*  --  188*  BUN 10 8  --  6  CREATININE 0.51 0.49*  --  0.41*  CALCIUM 9.3 8.5  --  8.0*  MG  --   --  2.1 2.0   GFR Estimated Creatinine Clearance: 104 ml/min (by C-G formula based on Cr of 0.41). Liver Function Tests:  Recent Labs Lab 09/12/13 1653  AST 20  ALT 16  ALKPHOS 75  BILITOT 0.3  PROT 7.6  ALBUMIN 3.2*   CBC:  Recent Labs Lab 09/12/13 1653 09/13/13 0355  WBC 10.4 8.8  NEUTROABS 7.6  --   HGB 14.8 12.7  HCT 40.6 36.0  MCV 83.5 85.1  PLT 338 272   CBG:  Recent Labs Lab 09/12/13 2221 09/12/13 2328 09/13/13 0031 09/13/13 0133 09/13/13 0235  GLUCAP 155* 171* 154* 168* 169*   Sepsis Labs:  Recent Labs Lab 09/12/13 1653 09/13/13 0355  WBC 10.4 8.8   Microbiology Recent Results (from the past 240  hour(s))  MRSA PCR SCREENING     Status: None   Collection Time    09/12/13  7:56 PM      Result Value Ref Range Status   MRSA by PCR NEGATIVE  NEGATIVE Final   Comment:            The GeneXpert MRSA Assay (FDA     approved for NASAL specimens     only), is one component of a     comprehensive MRSA colonization     surveillance program. It is not     intended to diagnose MRSA     infection nor to guide or     monitor treatment for     MRSA infections.     Medications:   . docusate sodium  100 mg Oral BID  . enoxaparin (LOVENOX) injection  40 mg Subcutaneous Q24H  . famotidine  40 mg Oral BID  . Influenza vac split quadrivalent PF  0.5 mL Intramuscular Tomorrow-1000  . insulin aspart  0-20 Units Subcutaneous 6 times per day  . insulin glargine  10 Units Subcutaneous QHS   Continuous Infusions: . sodium chloride Stopped (09/12/13 2145)  . dextrose 5 % and 0.45% NaCl 100 mL/hr at 09/12/13 2145    Time spent: 35 minutes. Patient is medically complex and requires high complexity decision-making.   LOS: 1 day   Keiland Pickering  [facility] Pager [phone]. If unable to reach me by pager, please call my cell phone at [phone].  *Please refer to [email or web address], password [email or web address] to get updated schedule on who will round on this patient, as hospitalists switch teams weekly. If 7PM-7AM, please contact night-coverage at [email or web address], password [email or web address] for any overnight needs.  09/13/2013, 7:37 AM

## 2013-09-13 NOTE — Progress Notes (Signed)
Patient [name] wants to leave AMA.  Spoke with Dr. Darnelle Catalan about patient wishes, and nurse was notified that if the patient were to try and leave, she would be involuntarily committed, and held here at the hospital until she could go to [facility].  Explained this to the patient, and she became verbally abusive.  She is not attempting to leave currently.  The social worker was paged to see about starting commitment papers.  Moniqua Engebretsen Debroah Loop RN

## 2013-09-13 NOTE — Progress Notes (Signed)
Unable to reach weekend social worker, called Dr. Darnelle Catalan.  She gave verbal order to administer Haldol,  IV and  Ativan, IV and to hold patient to keep her safe until IVC papers can be completed on the patient.  Spoke S. Henreitta Leber, RN, [facility] and she ask that I call the ED and ask for the IVC papers and to call the MD and let her know that the papers need to be completed by an MD and then she will come to the unit and notarize the papers.  Spoke with Dr. Darnelle Catalan and she stated that D. Janee Morn, MD will be coming to the unit to complete the paperwork.    Rulon Sera, RN attempted to give the patient the ordered medication and she has refused.

## 2013-09-14 DIAGNOSIS — R45851 Suicidal ideations: Secondary | ICD-10-CM

## 2013-09-14 LAB — BASIC METABOLIC PANEL
Anion gap: 10 (ref 5–15)
BUN: 9 mg/dL (ref 6–23)
CALCIUM: 8.5 mg/dL (ref 8.4–10.5)
CHLORIDE: 103 meq/L (ref 96–112)
CO2: 26 meq/L (ref 19–32)
Creatinine, Ser: 0.5 mg/dL (ref 0.50–1.10)
GFR calc Af Amer: >90 mL/min (ref >90)
GLUCOSE: 235 mg/dL — AB (ref 70–99)
POTASSIUM: 3.7 meq/L (ref 3.7–5.3)
Sodium: 139 mEq/L (ref 137–147)

## 2013-09-14 LAB — GLUCOSE, CAPILLARY
GLUCOSE-CAPILLARY: 212 mg/dL — AB (ref 70–99)
GLUCOSE-CAPILLARY: 279 mg/dL — AB (ref 70–99)
GLUCOSE-CAPILLARY: 127 mg/dL — AB (ref 70–99)
Glucose-Capillary: 226 mg/dL — ABNORMAL HIGH (ref 70–99)

## 2013-09-14 LAB — MAGNESIUM: MAGNESIUM: 2 mg/dL (ref 1.5–2.5)

## 2013-09-14 MED ORDER — INSULIN ASPART 100 UNIT/ML ~~LOC~~ SOLN
0.0000 [IU] | Freq: Three times a day (TID) | SUBCUTANEOUS | Status: DC
  Administered 2013-09-14: 7 [IU] via SUBCUTANEOUS
  Administered 2013-09-14: 3 [IU] via SUBCUTANEOUS
  Administered 2013-09-14: 11 [IU] via SUBCUTANEOUS
  Administered 2013-09-15: 7 [IU] via SUBCUTANEOUS
  Administered 2013-09-15: 4 [IU] via SUBCUTANEOUS
  Administered 2013-09-15: 3 [IU] via SUBCUTANEOUS
  Administered 2013-09-16: 7 [IU] via SUBCUTANEOUS
  Administered 2013-09-16: 4 [IU] via SUBCUTANEOUS
  Administered 2013-09-16: 7 [IU] via SUBCUTANEOUS

## 2013-09-14 MED ORDER — INSULIN ASPART 100 UNIT/ML ~~LOC~~ SOLN
6.0000 [IU] | Freq: Three times a day (TID) | SUBCUTANEOUS | Status: DC
  Administered 2013-09-14 – 2013-09-16 (×9): 6 [IU] via SUBCUTANEOUS

## 2013-09-14 MED ORDER — INSULIN GLARGINE 100 UNIT/ML ~~LOC~~ SOLN
27.0000 [IU] | Freq: Every day | SUBCUTANEOUS | Status: DC
  Administered 2013-09-14 – 2013-09-15 (×2): 27 [IU] via SUBCUTANEOUS
  Filled 2013-09-14 (×3): qty 0.27

## 2013-09-14 MED ORDER — INSULIN ASPART 100 UNIT/ML ~~LOC~~ SOLN
0.0000 [IU] | Freq: Every day | SUBCUTANEOUS | Status: DC
  Administered 2013-09-14: 2 [IU] via SUBCUTANEOUS
  Administered 2013-09-15: 4 [IU] via SUBCUTANEOUS

## 2013-09-14 MED ORDER — INSULIN GLARGINE 100 UNIT/ML ~~LOC~~ SOLN
15.0000 [IU] | Freq: Every day | SUBCUTANEOUS | Status: DC
  Filled 2013-09-14: qty 0.15

## 2013-09-14 NOTE — Progress Notes (Signed)
Progress Note   [name] ZOX:[id number] DOB: [birth year] DOA: 09/12/2013 PCP: Juline Patch, MD   Brief Narrative:   [name] is an 43 y.o. female with history of uncontrolled depression with numerous suicide attempts and admissions to [facility], uncontrolled T2DM, HTN, HLD, PCOS who was admitted on 09/12/13 with intentional drug overdose. She took 40 lexapro  tabs and 40 nortryiptyline  tabs and then called her boyfriend who notified EMS.   Assessment/Plan:   Principal Problem:   Suicide attempt by drug ingestion  Admitted to tele/SDU for closer monitoring.  Continue supportive care with IV fluids, holding sedating medications, and seizure precautions.  Continue Recruitment consultant.  Continue Ativan when necessary.  Seen by psychiatrist 09/13/13 with recommendations for inpatient psychiatric hospitalization.  Patient threatened to leave AMA last evening, commitment papers initiated.    Patient claims she is no longer suicidal.  Psychiatric re-evaluation requested.  Active Problems:   DKA Type 2 diabetes mellitus not at goal  Hemoglobin A 1C 13.2 07/20/13.  Diabetes coordinator consultation pending.  Initially treated with an insulin drip secondary to severe hyperglycemia and high anion gap but was transitioned to subcutaneous insulin when gap closed. Did not have any metabolic acidosis.  Currently on 10 units of Lantus and insulin resistant SSI Q AC/HS. CBGs [id number]. Increase Lantus to 15 units and add meal coverage.    Posttraumatic stress disorder  Continue Ativan as needed.    Bipolar affective disorder, depressed, severe  Transfer to inpatient psychiatry when bed available unless cleared for D/C home by psychiatrist.    Hypokalemia  Supplemented.    Hypertension  Hydrochlorothiazide currently on hold.    Prolonged QTC   Monitoring on telemetry. Avoid drugs that also prolonged QTC.    DVT Prophylaxis  Continue Lovenox.   Code Status:  Full. Family Communication: No family at the bedside. Disposition Plan: Home when stable.   IV Access:    Peripheral IV   Procedures and diagnostic studies:   No results found.   Medical Consultants:    Dr. Nehemiah Settle, Psychiatry   Other Consultants:    None.   Anti-Infectives:    None.  Subjective:   [name] threatened to leave AMA last evening and became agitated necessitating initiation of commitment.  She is currently calm and denies SI.  Says she "couldn't understand" psychiatrist when she was evaluated yesterday.    Objective:    Filed Vitals:   09/13/13 1800 09/13/13 2009 09/14/13 0024 09/14/13 0418  BP:    129/69  Pulse: 87   80  Temp:  97.8 F (36.6 C) 97.7 F (36.5 C) 98.6 F (37 C)  TempSrc:  Oral Oral Oral  Resp: 22   18  Height:      Weight:    88.4 kg (194 lb 14.2 oz)  SpO2: 94%   93%    Intake/Output Summary (Last 24 hours) at 09/14/13 0730 Last data filed at 09/14/13 0600  Gross per 24 hour  Intake   2760 ml  Output   1850 ml  Net    910 ml    Exam: Gen:  NAD, awake/alert Psych: Affect bright, smiling Cardiovascular:  RRR, No M/R/G Respiratory:  Lungs CTAB Gastrointestinal:  Abdomen soft, NT/ND, + BS Extremities:  No C/E/C   Data Reviewed:    Labs: Basic Metabolic Panel:  Recent Labs Lab 09/12/13 1653 09/12/13 2111 09/12/13 2150 09/13/13 0355 09/14/13 0413  NA 129* 143  --  139 139  K 4.2  3.2*  --  3.0* 3.7  CL 89* 103  --  99 103  CO2 24 28  --  27 26  GLUCOSE 571* 170*  --  188* 235*  BUN 10 8  --  6 9  CREATININE 0.51 0.49*  --  0.41* 0.50  CALCIUM 9.3 8.5  --  8.0* 8.5  MG  --   --  2.1 2.0 2.0   GFR Estimated Creatinine Clearance: 103.6 ml/min (by C-G formula based on Cr of 0.5). Liver Function Tests:  Recent Labs Lab 09/12/13 1653  AST 20  ALT 16  ALKPHOS 75  BILITOT 0.3  PROT 7.6  ALBUMIN 3.2*   CBC:  Recent Labs Lab 09/12/13 1653 09/13/13 0355  WBC 10.4  8.8  NEUTROABS 7.6  --   HGB 14.8 12.7  HCT 40.6 36.0  MCV 83.5 85.1  PLT 338 272   CBG:  Recent Labs Lab 09/13/13 0235 09/13/13 0755 09/13/13 1138 09/13/13 1613 09/13/13 2141  GLUCAP 169* 242* 205* 251* 321*   Sepsis Labs:  Recent Labs Lab 09/12/13 1653 09/13/13 0355  WBC 10.4 8.8   Microbiology Recent Results (from the past 240 hour(s))  MRSA PCR SCREENING     Status: None   Collection Time    09/12/13  7:56 PM      Result Value Ref Range Status   MRSA by PCR NEGATIVE  NEGATIVE Final   Comment:            The GeneXpert MRSA Assay (FDA     approved for NASAL specimens     only), is one component of a     comprehensive MRSA colonization     surveillance program. It is not     intended to diagnose MRSA     infection nor to guide or     monitor treatment for     MRSA infections.     Medications:   . antiseptic oral rinse  7 mL Mouth Rinse BID  . docusate sodium  100 mg Oral BID  . enoxaparin (LOVENOX) injection  40 mg Subcutaneous Q24H  . famotidine  40 mg Oral BID  . haloperidol lactate  5 mg Intravenous Once  . Influenza vac split quadrivalent PF  0.5 mL Intramuscular Tomorrow-1000  . insulin aspart  0-20 Units Subcutaneous TID WC  . insulin aspart  0-5 Units Subcutaneous QHS  . insulin glargine  10 Units Subcutaneous QHS  . LORazepam  1 mg Intravenous Once   Continuous Infusions: . 0.9 % NaCl with KCl 40 mEq / L 100 mL/hr (09/14/13 0516)    Time spent: 25 minutes.   LOS: 2 days   RAMA,CHRISTINA  [facility] Pager [phone]. If unable to reach me by pager, please call my cell phone at [phone].  *Please refer to [email or web address], password [email or web address] to get updated schedule on who will round on this patient, as hospitalists switch teams weekly. If 7PM-7AM, please contact night-coverage at [email or web address], password [email or web address] for any overnight needs.  09/14/2013, 7:30 AM

## 2013-09-14 NOTE — Progress Notes (Signed)
CSW spoke with Dr. Darnelle Catalan about patient and she is requesting placement of patient at [facility].  CSW spoke to [place] at [facility] who stated that they are currently at capacity and are unable to accept patient.  CSW sent requested information to [facility] for her to be placed on their wait list.  Patient is currently under IVC and paperwork was completed by MD.  Lupita Leash T. Jaci Lazier, [place]  [phone]  (weekend coverage)
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
## 2013-09-14 NOTE — Progress Notes (Signed)
Inpatient Diabetes Program Recommendations  AACE/ADA: New Consensus Statement on Inpatient Glycemic Control (2013)  Target Ranges:  Prepandial:   less than 140 mg/dL      Peak postprandial:   less than 180 mg/dL (1-2 hours)      Critically ill patients:  140 - 180 mg/dL   Results for [name], [name] (MRN [id number]) as of 09/14/2013 14:12  Ref. Range 09/13/2013 07:55 09/13/2013 11:38 09/13/2013 16:13 09/13/2013 21:41 09/14/2013 11:31  Glucose-Capillary Latest Range: 70-99 mg/dL 409 (H) 811 (H) 914 (H) 321 (H) 279 (H)   Diabetes history: DM2 Outpatient Diabetes medications: None listed on current home medication list. However, noted on prior notes in chart that patient has been on Lantus and Novolog as an outpatient in the past. Current orders for Inpatient glycemic control: Lantus 15 units QHS, Novolog 0-20 units AC, Novolog 0-5 units HS, Novolog 6 units TID with meals.  Inpatient Diabetes Program Recommendations Insulin - Basal: Please consider increasing Lantus to 27 units QHS (based on 90 kg x 0.2 units).  Thanks, [name] Penner, RN, MSN, CCRN Diabetes Coordinator Inpatient Diabetes Program [phone] (Team Pager) [phone] (AP office) [phone] [facility] office)

## 2013-09-15 ENCOUNTER — Other Ambulatory Visit (HOSPITAL_COMMUNITY): Payer: BC Managed Care – PPO

## 2013-09-15 DIAGNOSIS — G929 Unspecified toxic encephalopathy: Secondary | ICD-10-CM

## 2013-09-15 DIAGNOSIS — F411 Generalized anxiety disorder: Secondary | ICD-10-CM

## 2013-09-15 DIAGNOSIS — G92 Toxic encephalopathy: Secondary | ICD-10-CM

## 2013-09-15 LAB — GLUCOSE, CAPILLARY
GLUCOSE-CAPILLARY: 306 mg/dL — AB (ref 70–99)
Glucose-Capillary: 150 mg/dL — ABNORMAL HIGH (ref 70–99)
Glucose-Capillary: 232 mg/dL — ABNORMAL HIGH (ref 70–99)
Glucose-Capillary: 183 mg/dL — ABNORMAL HIGH (ref 70–99)
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
## 2013-09-15 NOTE — Progress Notes (Addendum)
Clinical Social Work  CSW continues to search for placement and expanded search. Update for following facilities:  [facility]- still reviewing.  [facility]- per [facility] Fermo) no available beds  [facility]- no available beds  [facility]- no available beds  [facility]- available beds. Referral faxed.  [facility]- available beds. Referral faxed.  [facility]- no available beds  [facility]- denied on 9/14 by MD  [facility]- no available beds  [facility]- still reviewing referral (Denied on 9/14)  CSW will continue to follow.  [place], [place] [phone]

## 2013-09-15 NOTE — Clinical Documentation Improvement (Signed)
  Please clarify in the progress notes and discharge summary if the patient has had "DKA" or Diabetes Mellitus Type 2, Uncontrolled this admission.  Thank You, Jerral Ralph ,RN Clinical Documentation Specialist:  [phone] [facility]- Health Information Management

## 2013-09-15 NOTE — Care Management Note (Unsigned)
    Page 1 of 1   09/15/2013     12:40:26 PM CARE MANAGEMENT NOTE 09/15/2013  Patient:  [name]   Account Number:  [id number]  Date Initiated:  09/15/2013  Documentation initiated by:  Algernon Huxley  Subjective/Objective Assessment:   43 year old female admitted after overdose, suicide attempt.     Action/Plan:   From home. Needs inpt psych when medically stable.   Anticipated DC Date:  09/18/2013   Anticipated DC Plan:  [facility]CHIATRIC HOSPITAL  In-house referral  Clinical Social Worker      DC Planning Services  CM consult      Choice offered to / List presented to:             Status of service:  In process, will continue to follow Medicare Important Message given?   (If response is "NO", the following Medicare IM given date fields will be blank) Date Medicare IM given:   Medicare IM given by:   Date Additional Medicare IM given:   Additional Medicare IM given by:    Discharge Disposition:    Per UR Regulation:  Reviewed for med. necessity/level of care/duration of stay  If discussed at Long Length of Stay Meetings, dates discussed:    Comments:

## 2013-09-15 NOTE — Progress Notes (Signed)
Clinical Social Work  Per progression meeting, patient is medically stable to DC today. CSW contacted the following facilities re: bed availability:  [facility]- available beds. Referral faxed.  [facility]- per [facility]) no available beds today  [facility]- no answer at admissions but CSW faxed referral  [facility]- no available beds  [facility]- available beds. Referral faxed.  CSW will continue to follow.  [place], [place] [phone]

## 2013-09-15 NOTE — Progress Notes (Signed)
Clinical Social Work  CSW received a call from [facility] reporting that they are interested in accepting patient but MD needs to review referral. [facility] reports available beds and they will call CSW with final determination.  [facility] and [facility] are still reviewing referral.  CSW will continue to follow.  [place], [place] [phone]

## 2013-09-15 NOTE — Progress Notes (Signed)
Clinical Social Work  Patient's IVC paperwork expires today. Attending MD signed IVC paperwork which was faxed to the Magistrate. CSW called Magistrate who confirmed that paperwork was received and they will serve patient. CSW continues to search for placement.  [place], [place] [phone]

## 2013-09-15 NOTE — Clinical Documentation Improvement (Signed)
  43 year old white female admitted with Drug Overdose, Lexapro and Nortriptyline.  "Metabolic encephalopathy from drug  Overdose" documented in the H&P.  Please clarify if the diagnosis of "Metabolic Encephalopathy" is inherent to the diagnosis of Drug Overdose and should not be coded as a separate diagnosis OR if "Metabolic Encephalopathy" is the manifestation of the patient's drug overdose.  Thank You, Jerral Ralph ,RN Clinical Documentation Specialist:  [phone] [facility]- Health Information Management

## 2013-09-16 ENCOUNTER — Inpatient Hospital Stay: Payer: Self-pay | Admitting: Psychiatry

## 2013-09-16 ENCOUNTER — Other Ambulatory Visit (HOSPITAL_COMMUNITY): Payer: BC Managed Care – PPO

## 2013-09-16 DIAGNOSIS — T788XXS Other adverse effects, not elsewhere classified, sequela: Secondary | ICD-10-CM

## 2013-09-16 DIAGNOSIS — X838XXS Intentional self-harm by other specified means, sequela: Secondary | ICD-10-CM

## 2013-09-16 DIAGNOSIS — T7421XA Adult sexual abuse, confirmed, initial encounter: Secondary | ICD-10-CM | POA: Diagnosis present

## 2013-09-16 DIAGNOSIS — T7589XS Other specified effects of external causes, sequela: Secondary | ICD-10-CM

## 2013-09-16 DIAGNOSIS — T50901S Poisoning by unspecified drugs, medicaments and biological substances, accidental (unintentional), sequela: Secondary | ICD-10-CM

## 2013-09-16 LAB — GLUCOSE, CAPILLARY
GLUCOSE-CAPILLARY: 198 mg/dL — AB (ref 70–99)
GLUCOSE-CAPILLARY: 236 mg/dL — AB (ref 70–99)
Glucose-Capillary: 221 mg/dL — ABNORMAL HIGH (ref 70–99)

## 2013-09-16 MED ORDER — LIP MEDEX EX OINT
TOPICAL_OINTMENT | CUTANEOUS | Status: AC
  Filled 2013-09-16: qty 7

## 2013-09-16 MED ORDER — INSULIN ASPART 100 UNIT/ML ~~LOC~~ SOLN: 6.0000 [IU] | Freq: Three times a day (TID) | SUBCUTANEOUS | Status: DC

## 2013-09-16 MED ORDER — INSULIN GLARGINE 100 UNIT/ML ~~LOC~~ SOLN: 27.0000 [IU] | Freq: Every day | SUBCUTANEOUS | Status: DC

## 2013-09-16 MED ORDER — LIP MEDEX EX OINT
TOPICAL_OINTMENT | CUTANEOUS | Status: DC | PRN
  Administered 2013-09-16: 15:00:00 via TOPICAL

## 2013-09-16 MED ORDER — INSULIN ASPART 100 UNIT/ML ~~LOC~~ SOLN: 0.0000 [IU] | Freq: Three times a day (TID) | SUBCUTANEOUS | Status: DC

## 2013-09-16 NOTE — Progress Notes (Signed)
Report called to Hilda Lias at [facility].  Philomena Doheny RN

## 2013-09-16 NOTE — Progress Notes (Signed)
Clinical Social Work  CSW continues to search for placement and the following facilities were contacted:  [facility]- admissions has lost referral and asked CSW to re-send. Referral faxed.  [facility]- per [facility]) patient is on waiting list  [facility]- no available beds   [facility]- no available beds   [facility]- no available beds  [facility]- left a message with admissions to determine if they could accept.  [facility]- admissions reports they have lost referral and asked CSW to re-send. Referral faxed  [facility]- available beds. Referral faxed   [facility]- available beds. Referral faxed.   [facility]- no available beds   [facility]- no available beds  [facility] denied on 9/14 by MD   [facility]- available beds. Referral faxed  [facility]- no available beds  [facility]- no available beds  [facility]- denied on 9/14  [facility]- no available beds  CSW will continue to follow.  [place], [place] [phone]
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
## 2013-09-16 NOTE — Progress Notes (Signed)
Clinical Social Work  CSW received a call from [place] in admissions from [facility] who reports that they can accept patient today. CSW text paged MD this information. [facility] reports they will call CSW back with room assignment. CSW will continue to follow.  [place], [place] [phone]

## 2013-09-16 NOTE — Discharge Instructions (Signed)
How to Avoid Diabetes Problems You can do a lot to prevent or slow down diabetes problems. Following your diabetes plan and taking care of yourself can reduce your risk of serious or life-threatening complications. Below, you will find certain things you can do to prevent diabetes problems. MANAGE YOUR DIABETES Follow your health care provider's, nurse educator's, and dietitian's instructions for managing your diabetes. They will teach you the basics of diabetes care. They can help answer questions you may have. Learn about diabetes and make healthy choices regarding eating and physical activity. Monitor your blood glucose level regularly. Your health care provider will help you decide how often to check your blood glucose level depending on your treatment goals and how well you are meeting them.  DO NOT USE NICOTINE Nicotine and diabetes are a dangerous combination. Nicotine raises your risk for diabetes problems. If you quit using nicotine, you will lower your risk for heart attack, stroke, nerve disease, and kidney disease. Your cholesterol and your blood pressure levels may improve. Your blood circulation will also improve. Do not use any tobacco products, including cigarettes, chewing tobacco, or electronic cigarettes. If you need help quitting, ask your health care provider. KEEP YOUR BLOOD PRESSURE UNDER CONTROL Keeping your blood pressure under control will help prevent damage to your eyes, kidneys, heart, and blood vessels. Blood pressure consists of two numbers. The top number should be below 120, and the bottom number should be below 80 (120/80). Keep your blood pressure as close to these numbers as you can. If you already have kidney disease, you may want even lower blood pressure to protect your kidneys. Talk to your health care provider to make sure that your blood pressure goal is right for your needs. Meal planning, medicines, and exercise can help you reach your blood pressure target. Have  your blood pressure checked at every visit with your health care provider. KEEP YOUR CHOLESTEROL UNDER CONTROL Normal cholesterol levels will help prevent heart disease and stroke. These are the biggest health problems for people with diabetes. Keeping cholesterol levels under control can also help with blood flow. Have your cholesterol level checked at least once a year. Your health care provider may prescribe a medicine known as a statin. Statins lower your cholesterol. If you are not taking a statin, ask your health care provider if you should be. Meal planning, exercise, and medicines can help you reach your cholesterol targets.  SCHEDULE AND KEEP YOUR ANNUAL PHYSICAL EXAMS AND EYE EXAMS Your health care provider will tell you how often he or she wants to see you depending on your plan of treatment. It is important that you keep these appointments so that possible problems can be identified early and complications can be avoided or treated.  Every visit with your health care provider should include your weight, blood pressure, and an evaluation of your blood glucose control.  Your hemoglobin A1c should be checked:  At least twice a year if you are at your goal.  Every 3 months if there are changes in treatment.  If you are not meeting your goals.  Your blood lipids should be checked yearly. You should also be checked yearly to see if you have protein in your urine (microalbumin).  Schedule a dilated eye exam within 5 years of your diagnosis if you have type 1 diabetes, and then yearly. Schedule a dilated eye exam at diagnosis if you have type 2 diabetes, and then yearly. All exams thereafter can be extended to every 2   to 3 years if one or more exams have been normal. KEEP YOUR VACCINES CURRENT The flu vaccine is recommended yearly. The formula for the vaccine changes every year and needs to be updated for the best protection against current viruses. It is recommended that people with diabetes  who are over 65 years old get the pneumonia vaccine. In some cases, two separate shots may be given. Ask your health care provider if your pneumonia vaccination is up-to-date. However, there are some instances where another vaccine is recommended. Check with your health care provider. TAKE CARE OF YOUR FEET  Diabetes may cause you to have a poor blood supply (circulation) to your legs and feet. Because of this, the skin may be thinner, break easier, and heal more slowly. You also may have nerve damage in your legs and feet, causing decreased feeling. You may not notice minor injuries to your feet that could lead to serious problems or infections. Taking care of your feet is very important. Visual foot exams are performed at every routine medical visit. The exams check for cuts, injuries, or other problems with the feet. A comprehensive foot exam should be done yearly. This includes visual inspection as well as assessing foot pulses and testing for loss of sensation. You should also do the following:  Inspect your feet daily for cuts, calluses, blisters, ingrown toenails, and signs of infection, such as redness, swelling, or pus.  Wash and dry your feet thoroughly, especially between the toes.  Avoid soaking your feet regularly in hot water baths.  Moisturize dry skin with lotion, avoiding areas between your toes.  Cut toenails straight across and file the edges.  Avoid shoes that do not fit well or have areas that irritate your skin.  Avoid going barefooted or wearing only socks. Your feet need protection. TAKE CARE OF YOUR TEETH People with poorly controlled diabetes are more likely to have gum (periodontal) disease. These infections make diabetes harder to control. Periodontal diseases, if left untreated, can lead to tooth loss. Brush your teeth twice a day, floss, and see your dentist for checkups and cleaning every 6 months, or 2 times a year. ASK YOUR HEALTH CARE PROVIDER ABOUT TAKING  ASPIRIN Taking aspirin daily is recommended to help prevent cardiovascular disease in people with and without diabetes. Ask your health care provider if this would benefit you and what dose he or she would recommend. DRINK RESPONSIBLY Moderate amounts of alcohol (less than 1 drink per day for adult women and less than 2 drinks per day for adult men) have a minimal effect on blood glucose if ingested with food. It is important to eat food with alcohol to avoid hypoglycemia. People should avoid alcohol if they have a history of alcohol abuse or dependence, if they are pregnant, and if they have liver disease, pancreatitis, advanced neuropathy, or severe hypertriglyceridemia. LESSEN STRESS Living with diabetes can be stressful. When you are under stress, your blood glucose may be affected in two ways:  Stress hormones may cause your blood glucose to rise.  You may be distracted from taking good care of yourself. It is a good idea to be aware of your stress level and make changes that are necessary to help you better manage challenging situations. Support groups, planned relaxation, a hobby you enjoy, meditation, healthy relationships, and exercise all work to lower your stress level. If your efforts do not seem to be helping, get help from your health care provider or a trained mental health professional. Document   Released: 09/06/2010 Document Revised: 05/05/2013 Document Reviewed: 02/12/2013 Exit[employer] Patient Information 2015 [employer]. This information is not intended to replace advice given to you by your health care provider. Make sure you discuss any questions you have with your health care provider.  

## 2013-09-16 NOTE — Progress Notes (Signed)
Clinical Social Work Department BRIEF PSYCHOSOCIAL ASSESSMENT 09/16/2013  Patient:  [name],[name]     Account Number:  [id number]     Admit date:  09/12/2013  Clinical Social Worker:  Dennison Bulla  Date/Time:  09/16/2013 04:00 PM  Referred by:  Physician  Date Referred:  09/16/2013 Referred for  Psychosocial assessment   Other Referral:   Interview type:  Patient Other interview type:    PSYCHOSOCIAL DATA Living Status:  FAMILY Admitted from facility:   Level of care:   Primary support name:  Joe Primary support relationship to patient:  FRIEND Degree of support available:   Strong    CURRENT CONCERNS Current Concerns  Post-Acute Placement   Other Concerns:    SOCIAL WORK ASSESSMENT / PLAN CSW received referral in order to assist with inpatient placement. Patient was accepted to [facility] by Dr. Cynda Acres to room 311-B. RN to call report to [phone].    CSW went to room in order to discuss plans with patient. Patient has friend (Joe) in room and patient is agreeable to his involvement. Patient reports that she was upset and overdosed when she was at home. Patient reports she has created a plan to have someone stay with her 24 hours a day and to remove all medications from the house. CSW explained IVC status and that psych MD recommends inpatient placement. Patient reports she would prefer placement at [facility] but CSW explained no bed availability at [facility] at this time.    Patient upset with plans and reports he is not being respected as a human being. Patient reports that she would prefer to go home but understands that she will be taken by sheriff to [facility].    CSW spoke with sheriff who will contact nursing station when they are on their way to transport patient.   Assessment/plan status:  No Further Intervention Required Other assessment/ plan:   Information/referral to community resources:   Will be transferred for inpatient psych treatment     PATIENT'S/FAMILY'S RESPONSE TO PLAN OF CARE: Patient alert and oriented. Patient tearful and angry and reporting that she feels that she should not have been placed under IVC. Patient aware of plans but reports she is not happy with outcome. Patient reports she will update friends and family of DC plans and reports no further needs from CSW.       [place], [place] [phone]

## 2013-09-17 ENCOUNTER — Other Ambulatory Visit (HOSPITAL_COMMUNITY): Payer: BC Managed Care – PPO

## 2013-09-18 ENCOUNTER — Other Ambulatory Visit (HOSPITAL_COMMUNITY): Payer: BC Managed Care – PPO

## 2013-09-18 LAB — BASIC METABOLIC PANEL
Anion Gap: 10 (ref 7–16)
BUN: 11 mg/dL (ref 7–18)
CO2: 23 mmol/L (ref 21–32)
CREATININE: 0.64 mg/dL (ref 0.60–1.30)
Calcium, Total: 8.4 mg/dL — ABNORMAL LOW (ref 8.5–10.1)
Chloride: 104 mmol/L (ref 98–107)
EGFR (African American): >60
Glucose: 270 mg/dL — ABNORMAL HIGH (ref 65–99)
OSMOLALITY: 283 (ref 275–301)
Potassium: 4.2 mmol/L (ref 3.5–5.1)
SODIUM: 137 mmol/L (ref 136–145)

## 2013-09-18 LAB — LIPID PANEL
CHOLESTEROL: 263 mg/dL — AB (ref 0–200)
HDL Cholesterol: 49 mg/dL (ref 40–60)
LDL CHOLESTEROL, CALC: 159 mg/dL — AB (ref 0–100)
Triglycerides: 274 mg/dL — ABNORMAL HIGH (ref 0–200)
VLDL Cholesterol, Calc: 55 mg/dL — ABNORMAL HIGH (ref 5–40)

## 2013-09-18 LAB — LITHIUM LEVEL: Lithium: 0.66 mmol/L

## 2013-09-19 ENCOUNTER — Other Ambulatory Visit (HOSPITAL_COMMUNITY): Payer: BC Managed Care – PPO

## 2013-09-23 ENCOUNTER — Ambulatory Visit (HOSPITAL_COMMUNITY): Payer: Self-pay | Admitting: Psychiatry

## 2013-09-26 ENCOUNTER — Ambulatory Visit (HOSPITAL_COMMUNITY): Payer: Self-pay | Admitting: Licensed Clinical Social Worker

## 2013-09-29 ENCOUNTER — Ambulatory Visit (HOSPITAL_COMMUNITY): Payer: Self-pay | Admitting: Psychiatry

## 2013-11-27 ENCOUNTER — Emergency Department (HOSPITAL_COMMUNITY)
Admission: EM | Admit: 2013-11-27 | Discharge: 2013-11-28 | Disposition: A | Discharge status: Home / Self Care | Payer: BC Managed Care – PPO | Attending: Emergency Medicine | Admitting: Emergency Medicine

## 2013-11-27 ENCOUNTER — Encounter (HOSPITAL_COMMUNITY): Payer: Self-pay

## 2013-11-27 DIAGNOSIS — R197 Diarrhea, unspecified: Secondary | ICD-10-CM | POA: Insufficient documentation

## 2013-11-27 DIAGNOSIS — R112 Nausea with vomiting, unspecified: Secondary | ICD-10-CM | POA: Insufficient documentation

## 2013-11-27 DIAGNOSIS — I10 Essential (primary) hypertension: Secondary | ICD-10-CM | POA: Insufficient documentation

## 2013-11-27 DIAGNOSIS — Z79899 Other long term (current) drug therapy: Secondary | ICD-10-CM | POA: Insufficient documentation

## 2013-11-27 DIAGNOSIS — R739 Hyperglycemia, unspecified: Secondary | ICD-10-CM

## 2013-11-27 DIAGNOSIS — F419 Anxiety disorder, unspecified: Secondary | ICD-10-CM | POA: Insufficient documentation

## 2013-11-27 DIAGNOSIS — E1165 Type 2 diabetes mellitus with hyperglycemia: Secondary | ICD-10-CM | POA: Insufficient documentation

## 2013-11-27 DIAGNOSIS — F329 Major depressive disorder, single episode, unspecified: Secondary | ICD-10-CM | POA: Insufficient documentation

## 2013-11-27 DIAGNOSIS — Z3202 Encounter for pregnancy test, result negative: Secondary | ICD-10-CM | POA: Insufficient documentation

## 2013-11-27 DIAGNOSIS — E780 Pure hypercholesterolemia: Secondary | ICD-10-CM | POA: Insufficient documentation

## 2013-11-27 DIAGNOSIS — Z87891 Personal history of nicotine dependence: Secondary | ICD-10-CM | POA: Insufficient documentation

## 2013-11-27 DIAGNOSIS — Z794 Long term (current) use of insulin: Secondary | ICD-10-CM | POA: Insufficient documentation

## 2013-11-27 NOTE — ED Notes (Signed)
Pt complains of a fever since yesterday, she states she vomited yesterday but not today

## 2013-11-28 LAB — URINE MICROSCOPIC-ADD ON

## 2013-11-28 LAB — CBC WITH DIFFERENTIAL/PLATELET
BASOS ABS: 0 10*3/uL (ref 0.0–0.1)
Basophils Relative: 0 % (ref 0–1)
Eosinophils Absolute: 0.1 10*3/uL (ref 0.0–0.7)
Eosinophils Relative: 1 % (ref 0–5)
HEMATOCRIT: 41.9 % (ref 36.0–46.0)
Hemoglobin: 14.7 g/dL (ref 12.0–15.0)
LYMPHS PCT: 21 % (ref 12–46)
Lymphs Abs: 1.4 10*3/uL (ref 0.7–4.0)
MCH: 29.4 pg (ref 26.0–34.0)
MCHC: 35.1 g/dL (ref 30.0–36.0)
MCV: 83.8 fL (ref 78.0–100.0)
MONOS PCT: 15 % — AB (ref 3–12)
Monocytes Absolute: 1 10*3/uL (ref 0.1–1.0)
NEUTROS ABS: 4.3 10*3/uL (ref 1.7–7.7)
Neutrophils Relative %: 63 % (ref 43–77)
Platelets: 281 10*3/uL (ref 150–400)
RBC: 5 MIL/uL (ref 3.87–5.11)
RDW: 12.5 % (ref 11.5–15.5)
WBC: 6.9 10*3/uL (ref 4.0–10.5)

## 2013-11-28 LAB — URINALYSIS, ROUTINE W REFLEX MICROSCOPIC
BILIRUBIN URINE: NEGATIVE
Glucose, UA: >1000 mg/dL — AB
HGB URINE DIPSTICK: NEGATIVE
Ketones, ur: 15 mg/dL — AB
Leukocytes, UA: NEGATIVE
Nitrite: NEGATIVE
PH: 6 (ref 5.0–8.0)
Protein, ur: NEGATIVE mg/dL
SPECIFIC GRAVITY, URINE: 1.044 — AB (ref 1.005–1.030)
Urobilinogen, UA: 0.2 mg/dL (ref 0.0–1.0)

## 2013-11-28 LAB — I-STAT CHEM 8, ED
BUN: 7 mg/dL (ref 6–23)
CHLORIDE: 96 meq/L (ref 96–112)
Calcium, Ion: 1.1 mmol/L — ABNORMAL LOW (ref 1.12–1.23)
Creatinine, Ser: 0.5 mg/dL (ref 0.50–1.10)
GLUCOSE: 422 mg/dL — AB (ref 70–99)
HCT: 46 % (ref 36.0–46.0)
Hemoglobin: 15.6 g/dL — ABNORMAL HIGH (ref 12.0–15.0)
Potassium: 3.6 mEq/L — ABNORMAL LOW (ref 3.7–5.3)
Sodium: 132 mEq/L — ABNORMAL LOW (ref 137–147)
TCO2: 24 mmol/L (ref 0–100)

## 2013-11-28 LAB — BASIC METABOLIC PANEL
ANION GAP: 13 (ref 5–15)
BUN: 9 mg/dL (ref 6–23)
CHLORIDE: 91 meq/L — AB (ref 96–112)
CO2: 25 meq/L (ref 19–32)
Calcium: 8.6 mg/dL (ref 8.4–10.5)
Creatinine, Ser: 0.48 mg/dL — ABNORMAL LOW (ref 0.50–1.10)
GFR calc Af Amer: >90 mL/min (ref >90)
GFR calc non Af Amer: >90 mL/min (ref >90)
Glucose, Bld: 412 mg/dL — ABNORMAL HIGH (ref 70–99)
Potassium: 3.7 mEq/L (ref 3.7–5.3)
Sodium: 129 mEq/L — ABNORMAL LOW (ref 137–147)

## 2013-11-28 LAB — POC URINE PREG, ED: Preg Test, Ur: NEGATIVE

## 2013-11-28 LAB — CBG MONITORING, ED
Glucose-Capillary: 338 mg/dL — ABNORMAL HIGH (ref 70–99)
Glucose-Capillary: 374 mg/dL — ABNORMAL HIGH (ref 70–99)

## 2013-11-28 MED ORDER — ONDANSETRON HCL 4 MG/2ML IJ SOLN
4.0000 mg | Freq: Once | INTRAMUSCULAR | Status: AC
  Administered 2013-11-28: 4 mg via INTRAVENOUS
  Filled 2013-11-28: qty 2

## 2013-11-28 MED ORDER — SODIUM CHLORIDE 0.9 % IV BOLUS (SEPSIS): 1000.0000 mL | Freq: Once | INTRAVENOUS | Status: DC

## 2013-11-28 MED ORDER — POTASSIUM CHLORIDE CRYS ER 20 MEQ PO TBCR
40.0000 meq | EXTENDED_RELEASE_TABLET | Freq: Once | ORAL | Status: AC
  Administered 2013-11-28: 40 meq via ORAL
  Filled 2013-11-28: qty 2

## 2013-11-28 MED ORDER — SODIUM CHLORIDE 0.9 % IV BOLUS (SEPSIS)
2000.0000 mL | Freq: Once | INTRAVENOUS | Status: AC
  Administered 2013-11-28: 2000 mL via INTRAVENOUS

## 2013-11-28 MED ORDER — INSULIN ASPART 100 UNIT/ML ~~LOC~~ SOLN
6.0000 [IU] | Freq: Once | SUBCUTANEOUS | Status: AC
  Administered 2013-11-28: 6 [IU] via SUBCUTANEOUS
  Filled 2013-11-28: qty 1

## 2013-11-28 MED ORDER — MORPHINE SULFATE 4 MG/ML IJ SOLN
4.0000 mg | Freq: Once | INTRAMUSCULAR | Status: AC
  Administered 2013-11-28: 4 mg via INTRAVENOUS
  Filled 2013-11-28: qty 1

## 2013-11-28 NOTE — ED Provider Notes (Signed)
CSN: [id number]     Arrival date & time 11/27/13  2341 History   First MD Initiated Contact with Patient 11/27/13 2354     Chief Complaint  Patient presents with  . Fever     (Consider location/radiation/quality/duration/timing/severity/associated sxs/prior Treatment) Patient is a 43 y.o. female presenting with fever. The history is provided by the patient.  Fever Max temp prior to arrival:  100.5 Temp source:  Oral Onset quality:  Unable to specify Duration:  2 days Timing:  Intermittent Progression:  Waxing and waning Chronicity:  New Relieved by:  Acetaminophen Worsened by:  Nothing tried Ineffective treatments:  None tried Associated symptoms: diarrhea, nausea and vomiting   Associated symptoms: no chest pain, no chills, no confusion, no congestion, no cough, no dysuria, no headaches, no rhinorrhea and no sore throat   Associated symptoms comment:  Abdominal cramps Diarrhea:    Quality:  Watery   Number of occurrences:  8   Severity:  Moderate   Duration:  2 days   Timing:  Intermittent   Progression:  Improving Vomiting:    Quality:  Stomach contents   Number of occurrences:  3   Severity:  Mild   Duration:  2 days   Timing:  Intermittent   Progression:  Improving Risk factors: no hx of cancer, no immunosuppression, no occupational exposure, no recent surgery, no recent travel and no sick contacts     Past Medical History  Diagnosis Date  . Diabetes mellitus without complication   . Hypertension   . High cholesterol   . Neuropathy   . PCOS (polycystic ovarian syndrome)   . Anxiety   . Depression    Past Surgical History  Procedure Laterality Date  . Elbow surgery     Family History  Problem Relation Age of Onset  . Depression Mother   . Anxiety disorder Mother    History  Substance Use Topics  . Smoking status: Former Smoker    Types: Cigarettes    Quit date: 09/12/2000  . Smokeless tobacco: Never Used  . Alcohol Use: Yes     Comment:  occasional   OB History    No data available     Review of Systems  Constitutional: Positive for fever. Negative for chills, diaphoresis, activity change, appetite change and fatigue.  HENT: Negative for congestion, facial swelling, rhinorrhea and sore throat.   Eyes: Negative for photophobia and discharge.  Respiratory: Negative for cough, chest tightness and shortness of breath.   Cardiovascular: Negative for chest pain, palpitations and leg swelling.  Gastrointestinal: Positive for nausea, vomiting and diarrhea. Negative for abdominal pain.  Endocrine: Negative for polydipsia and polyuria.  Genitourinary: Negative for dysuria, frequency, difficulty urinating and pelvic pain.  Musculoskeletal: Negative for back pain, arthralgias, neck pain and neck stiffness.  Skin: Negative for color change and wound.  Allergic/Immunologic: Negative for immunocompromised state.  Neurological: Negative for facial asymmetry, weakness, numbness and headaches.  Hematological: Does not bruise/bleed easily.  Psychiatric/Behavioral: Negative for confusion and agitation.      Allergies  Review of patient's allergies indicates no known allergies.  Home Medications   Prior to Admission medications   Medication Sig Start Date End Date Taking? Authorizing Provider  famotidine (PEPCID) 40 MG tablet Take 40 mg by mouth 2 (two) times daily.    Historical Provider, MD  hydrochlorothiazide (HYDRODIURIL) 25 MG tablet Take 25 mg by mouth daily.    Historical Provider, MD  insulin aspart (NOVOLOG) 100 UNIT/ML injection Inject 0-20 Units into  the skin 3 (three) times daily with meals. 09/16/13   Maryruth Bunhristina P Rama, MD  insulin aspart (NOVOLOG) 100 UNIT/ML injection Inject 6 Units into the skin 3 (three) times daily with meals. 09/16/13   Maryruth Bunhristina P Rama, MD  insulin glargine (LANTUS) 100 UNIT/ML injection Inject 0.27 mLs (27 Units total) into the skin at bedtime. 09/16/13   Christina P Rama, MD   BP 126/82 mmHg   Pulse 102  Temp(Src) 98 F (36.7 C)  Resp 18  SpO2 99%  LMP 11/13/2013 Physical Exam  Constitutional: She is oriented to person, place, and time. She appears well-developed and well-nourished. No distress.  HENT:  Head: Normocephalic and atraumatic.  Mouth/Throat: No oropharyngeal exudate.  Eyes: Pupils are equal, round, and reactive to light.  Neck: Normal range of motion. Neck supple.  Cardiovascular: Normal rate, regular rhythm and normal heart sounds.  Exam reveals no gallop and no friction rub.   No murmur heard. Pulmonary/Chest: Effort normal and breath sounds normal. No respiratory distress. She has no wheezes. She has no rales.  Abdominal: Soft. Bowel sounds are normal. She exhibits no distension and no mass. There is tenderness in the right lower quadrant, suprapubic area and left lower quadrant. There is no rigidity, no rebound, no guarding, no tenderness at McBurney's point and negative Murphy's sign.  Musculoskeletal: Normal range of motion. She exhibits no edema or tenderness.  Neurological: She is alert and oriented to person, place, and time.  Skin: Skin is warm and dry.  Psychiatric: She has a normal mood and affect.    ED Course  Procedures (including critical care time) Labs Review Labs Reviewed  I-STAT CHEM 8, ED - Abnormal; Notable for the following:    Sodium 132 (*)    Potassium 3.6 (*)    Glucose, Bld 422 (*)    Calcium, Ion 1.10 (*)    Hemoglobin 15.6 (*)    All other components within normal limits  CBG MONITORING, ED - Abnormal; Notable for the following:    Glucose-Capillary 374 (*)    All other components within normal limits  URINALYSIS, ROUTINE W REFLEX MICROSCOPIC  CBC WITH DIFFERENTIAL  BASIC METABOLIC PANEL  POC URINE PREG, ED    Imaging Review No results found.   EKG Interpretation None      MDM   Final diagnoses:  Nausea vomiting and diarrhea  Hyperglycemia    Pt is a 43 y.o. female with Pmhx as above who presents with 2  days of fevers, chills, crampy low abdominal pain, nausea, vomiting and diarrhea.  Emesis is nonbloody and nonbilious.  She's had a total of 3 episodes of emesis and possibly 7-8 episodes of watery diarrhea.  She also reports that she is not compliant with her insulin and has not taken it for several months.  She does not have health insurance.  On physical exam she is mildly tachycardic but afebrile and in no acute distress.  She is mild tenderness to low abdomen without rebound or guarding.  Glucose on i-STAT chem 8 is 422.  I suspect a viral gastroenteritis, will also treat hyperglycemia  Patient feeling improved after 2 L of normal saline, IV Zofran and 1 dose of IV morphine.  She is tolerated liquids without difficulty.  Potassium was replaced in the ED.  Glucose improved from 412 02/05/1970 with IV fluids.  Patient also given her home dose of 6 units subcutaneous.  I have offered her referral to case management for consideration for the match program for  prescriptions.  She states she is also spoken to [name] in the past for help with prescription cost.  She has declined referral to case management and understands the importance of refilling her insulin prescription.  Patient given then the resource guide as well as referral to the [facility].      Toy Cookey, MD 11/28/13 [phone]

## 2013-11-28 NOTE — Discharge Instructions (Signed)
°Emergency Department Resource Guide °1) Find a Doctor and Pay Out of Pocket °Although you won't have to find out who is covered by your insurance plan, it is a good idea to ask around and get recommendations. You will then need to call the office and see if the doctor you have chosen will accept you as a new patient and what types of options they offer for patients who are self-pay. Some doctors offer discounts or will set up payment plans for their patients who do not have insurance, but you will need to ask so you aren't surprised when you get to your appointment. ° °2) Contact Your Local Health Department °Not all health departments have doctors that can see patients for sick visits, but many do, so it is worth a call to see if yours does. If you don't know where your local health department is, you can check in your phone book. The CDC also has a tool to help you locate your state's health department, and many state websites also have listings of all of their local health departments. ° °3) Find a Walk-in Clinic °If your illness is not likely to be very severe or complicated, you may want to try a walk in clinic. These are popping up all over the country in pharmacies, drugstores, and shopping centers. They're usually staffed by nurse practitioners or physician assistants that have been trained to treat common illnesses and complaints. They're usually fairly quick and inexpensive. However, if you have serious medical issues or chronic medical problems, these are probably not your best option. ° °No Primary Care Doctor: °- Call [facility] at  832-8000 - they can help you locate a primary care doctor that  accepts your insurance, provides certain services, etc. °- Physician Referral Service- 1-800-533-3463 ° °Chronic Pain Problems: °Organization         Address  Phone   Notes  °[facility]  (336) 297-2271 Patients need to be referred by their primary care doctor.  ° °Medication  Assistance: °Organization         Address  Phone   Notes  °[place] [facility] [address] °[place], [place] [address] (336) 641-8030 --Must be a resident of [place] °-- Must have NO insurance coverage whatsoever (no Medicaid/ Medicare, etc.) °-- The pt. MUST have a primary care doctor that directs their care regularly and follows them in the community °  °[facility]  (866) 331-1348   °[employer]  (888) 892-1162   ° °Agencies that provide inexpensive medical care: °Organization         Address  Phone   Notes  °[facility]  (336) 832-8035   °[facility]    (336) 832-7272   °[facility] [address] °[place], [place] [address] (336) 832-4777   °[facility] [address], °[place] (336) 271-4999   °[facility]    (336) 373-0678   °[facility]    (336) 272-1050   °[facility] ° [address], [place] Phone:  (336) 832-4444, Fax:  (336) 832-4440 Hours of Operation:  9 am - 6 pm, M-F.  Also accepts Medicaid/Medicare and self-pay.  °[facility] ° [address], [place] Phone: (336) [phone], Fax: (336) [phone]. Hours of Operation:  8:30 am - 5:30 pm, M-F.  Also accepts Medicaid and self-pay.  °[facility] [place] [address], [place] Phone: (336) 878-6027   °[facility] [address], [place], [place] (336)723-1848, Ext. 123 Mondays & Thursdays: 7-9 AM.  First 15 patients are seen on a first come, first serve basis. °  ° °Medicaid-accepting [place] Providers: ° °Organization         Address  Phone   Notes  °[facility] [address], [place] (336) 641-2100 Also accepts self-pay patients.  °[facility] [address], [place] ° (336) 856-9996   °[facility] [address], [place]  (336) 288-8857   °[facility] [address] [place] [address], [place] (336) 299-7000   °Veita Bland [address], [place]  ° (336) 373-1557 Only accepts [place] Access Medicaid patients after they have their name applied to their card.  ° °Self-Pay (no insurance) in [place]: ° °Organization         Address  Phone   Notes  °Sickle Cell Patients, [facility] [address], [place] (336) 832-1970   °[facility] [address], [place] (336) 832-4400   °[facility] ° [address], [place] (336) 992-4800   °[facility]/Dr. Osei-Bonsu ° [address] [place] [address], [place] or [address], [place] (336) 841-8500 Phone number for both [place] and [place] locations is the same.  °[facility] [address], [place] (336) 299-0000   °[facility] [address] [place] [address], [place] or [address] (336) 852-7530 °(336) 878-2260   °[facility] [address], [place] (336) 350-1642, phone; (336) 294-5005, fax Sees patients 1st and 3rd Saturday of every month.  Must not qualify for public or private insurance (i.e. Medicaid, Medicare, [place] Health Choice, Veterans' Benefits) • Household income should be no more than 200% of the poverty level •The clinic cannot treat you if you are pregnant or think you are pregnant • Sexually transmitted diseases are not treated at the clinic.  ° ° °Dental Care: °Organization         Address  Phone  Notes  °[place] [facility] [address], [place] (336) 641-6152 Accepts children up to age 21 who are enrolled in Medicaid or [place] Health Choice; pregnant women with a Medicaid card; and children who have applied for Medicaid or [place] Health Choice, but were declined, whose parents can pay a reduced fee at time of service.  °[place]  [facility] [place]  [address], [place] (336) 641-7733 Accepts children up to age 21 who are enrolled in Medicaid or [place] Health Choice; pregnant women with a Medicaid card; and children who have applied for Medicaid or [place] Health Choice, but were declined, whose parents can pay a reduced fee at time of service.  °[facility] ° [address], [place] (336) 641-4533 Patients are seen by appointment only. Walk-ins are not accepted. [facility] will see patients 18 years of age and older. °Monday - Tuesday (8am-5pm) °Most Wednesdays (8:30-5pm) °$30 per visit, cash only  °[facility] ° [address], [place] (336) 641-4533 Patients are seen by appointment only. Walk-ins are not accepted. [facility] will see patients 18 years of age and older. °One   Wednesday Evening (Monthly: Volunteer Based).  $30 per visit, cash only  °[facility]  (919) [phone] for adults; Children under age 4, call Graduate Pediatric Dentistry at ([phone]) [phone]. Children aged 4-14, please call ([phone]) [phone] to request a pediatric application. ° Dental services are provided in all areas of dental care including fillings, crowns and bridges, complete and partial dentures, implants, gum treatment, root canals, and extractions. Preventive care is also provided. Treatment is provided to both adults and children. °Patients are selected via a lottery and there is often a waiting list. °  °[facility] [address], °[place] ° (336) 763-8833 [email or web address] °  °[facility] [address], [place], [place] (336)723-1848, Ext. 123 Second and Fourth Thursday of each month, opens at 6:30 AM; Clinic ends at 9 AM.  Patients are seen on a first-come first-served basis, and a limited number are seen during each clinic.  ° °[facility] ° [address], [place], [place] (336) 723-7904    Eligibility Requirements °You must have lived in [place], [place], or [place] counties for at least the last three months. °  You cannot be eligible for state or federal sponsored healthcare insurance, including Veterans Administration, Medicaid, or Medicare. °  You generally cannot be eligible for healthcare insurance through your employer.  °  How to apply: °Eligibility screenings are held every Tuesday and Wednesday afternoon from 1:00 pm until 4:00 pm. You do not need an appointment for the interview!  °[facility] [address], [place], [place] 336-631-2330   °[facility]  336-342-8273   °[facility]  336-703-3100   °[facility]  336-570-6415   ° °Behavioral Health Resources in the Community: °Intensive Outpatient Programs °Organization         Address  Phone  Notes  °[facility] [address], [place], [place] 336-878-6098   °[facility] [address], [place], [place] 336-832-9800   °[facility] [address], [place], [place] ° 336-882-2125   °[facility] [address],  °[place], [place] 1-800-853-5163 or 336-641-4981   °Substance Abuse Resources °Organization         Address  Phone  Notes  °[facility]  336-882-2125   °[facility]  336-784-9470   °[facility]  336-285-9073   °[facility]  336-845-3988   °Residential & Outpatient Substance Abuse Program  1-800-659-3381   °Psychological Services °Organization         Address  Phone  Notes  °[facility]  336- 832-9600   °[facility]  336- 378-7881   °[facility] [address], [place] 1-800-853-5163 or 336-641-4981   ° °Mobile Crisis Teams °Organization         Address  Phone  Notes  °[facility], Mobile Crisis Care Unit  1-877-626-1772   °Assertive °[facility] ° [address]  [place], [place] 336-834-9664   °Sharon DeEsch [address] °[place] 336-554-5454   ° °Self-Help/Support Groups °Organization         Address  Phone             Notes  °[facility]  - variety of support groups  336- 373-1402 Call for more information  °Narcotics Anonymous (NA), [facility] [address], °[place]  2 meetings at this location  ° °  Residential Treatment Programs Organization         Address  Phone  Notes  [facility] [address],    [place]  [phone]-(250) [phone]   [facility]  [address], [place] [phone], [place], [place] [phone]   [facility] [address], [place] [phone] Admissions: 8am-3pm M-F  [facility] [address]    [place], [place] [phone]   [facility] [address], [place], [place] [phone]   [facility] [address]  [place], [place] [phone]   [facility] Intensive Outpatient [address], [place], [place] [phone]   [facility] [address]  [place], [place] [phone] or [phone]   [facility] ([facility] [address] [place], [place] [phone] Accepts Medicaid  [facility] [address]  [place] [phone] Substance Abuse/Addiction Treatment   [facility]         Address  Phone  Notes  [facility]  2160955208(888) 224-128-1965   Angie FavaJulie Brannon, PhD [address], [address] [place], [place]   (720)[phone](336) [phone] or (667)[phone](336) [phone]   [facility]   [name] [place], [place] [phone](336) ([phone]   [facility] [address], [place], [place] [phone](336) (702) [phone] Insurance/Medicaid/sponsorship through [facility] [address]                                    [place], [place] [phone](336) (702) [phone] Therapy/tele-psych/case    [facility] [address]   [place], [place] [phone]) [phone]    Dr. Lolly MustacheArfeen  [phone](336) [phone]   [facility]  [employer] [facility] 1) [address], [place] 2) [address], [place] 3)  [address], [place] [phone]) [phone](336) [phone](336) [phone]  [phone](336) [phone]   [facility] [place] Child Abuse Hotline [phone](336) (754)[phone] or [phone](336) [phone] (After Hours)      Hyperglycemia Hyperglycemia occurs when the glucose (sugar) in your blood is too high. Hyperglycemia can happen for many reasons, but it most often happens to people who do not know they have diabetes or are not managing their diabetes properly.  CAUSES  Whether you have diabetes or not, there are other causes of hyperglycemia. Hyperglycemia can occur when you have diabetes, but it can also occur in other situations that you might not be as aware of, such as: Diabetes  If you have diabetes and are having problems controlling your blood glucose, hyperglycemia could occur because of some of the following reasons:  Not following your meal plan.  Not taking your diabetes medications or not taking it properly.  Exercising less or doing less activity than you normally do.  Being sick. Pre-diabetes  This cannot be ignored. Before people develop Type 2 diabetes, they almost always have "pre-diabetes." This is when your blood glucose levels are higher than normal, but not yet high enough to be diagnosed as diabetes. Research has shown that some long-term damage to the body, especially the heart and circulatory system, may already be occurring during pre-diabetes. If you take action to manage your blood glucose when you have pre-diabetes, you may delay or prevent Type 2 diabetes from developing. Stress  If you have diabetes, you may be "diet" controlled or on oral  medications or insulin to control your diabetes. However, you may find that your blood glucose is higher than usual in the hospital whether  you have diabetes or not. This is often referred to as "stress hyperglycemia." Stress can elevate your blood glucose. This happens because of hormones put out by the body during times of stress. If stress has been the cause of your high blood glucose, it can be followed regularly by your caregiver. That way he/she can make sure your hyperglycemia does not continue to get worse or progress to diabetes. Steroids  Steroids are medications that act on the infection fighting system (immune system) to block inflammation or infection. One side effect can be a rise in blood glucose. Most people can produce enough extra insulin to allow for this rise, but for those who cannot, steroids make blood glucose levels go even higher. It is not unusual for steroid treatments to "uncover" diabetes that is developing. It is not always possible to determine if the hyperglycemia will go away after the steroids are stopped. A special blood test called an A1c is sometimes done to determine if your blood glucose was elevated before the steroids were started. SYMPTOMS  Thirsty.  Frequent urination.  Dry mouth.  Blurred vision.  Tired or fatigue.  Weakness.  Sleepy.  Tingling in feet or leg. DIAGNOSIS  Diagnosis is made by monitoring blood glucose in one or all of the following ways:  A1c test. This is a chemical found in your blood.  Fingerstick blood glucose monitoring.  Laboratory results. TREATMENT  First, knowing the cause of the hyperglycemia is important before the hyperglycemia can be treated. Treatment may include, but is not be limited to:  Education.  Change or adjustment in medications.  Change or adjustment in meal plan.  Treatment for an illness, infection, etc.  More frequent blood glucose monitoring.  Change in exercise plan.  Decreasing or stopping steroids.  Lifestyle changes. HOME CARE INSTRUCTIONS   Test your blood glucose as directed.  Exercise regularly. Your caregiver  will give you instructions about exercise. Pre-diabetes or diabetes which comes on with stress is helped by exercising.  Eat wholesome, balanced meals. Eat often and at regular, fixed times. Your caregiver or nutritionist will give you a meal plan to guide your sugar intake.  Being at an ideal weight is important. If needed, losing as little as 10 to 15 pounds may help improve blood glucose levels. SEEK MEDICAL CARE IF:   You have questions about medicine, activity, or diet.  You continue to have symptoms (problems such as increased thirst, urination, or weight gain). SEEK IMMEDIATE MEDICAL CARE IF:   You are vomiting or have diarrhea.  Your breath smells fruity.  You are breathing faster or slower.  You are very sleepy or incoherent.  You have numbness, tingling, or pain in your feet or hands.  You have chest pain.  Your symptoms get worse even though you have been following your caregiver's orders.  If you have any other questions or concerns. Document Released: 06/14/2000 Document Revised: 03/13/2011 Document Reviewed: 04/17/2011 [facility] Patient Information 2015 [employer] [place]. This information is not intended to replace advice given to you by your health care provider. Make sure you discuss any questions you have with your health care provider.

## 2013-11-28 NOTE — ED Notes (Signed)
Spoke with lab about blood already in lab. Blood in lab can be used for lab test ordered.

## 2013-11-28 NOTE — ED Notes (Signed)
RN to start IV on pt and get blood work at same time

## 2013-11-28 NOTE — ED Notes (Signed)
Denies any complications with urination.  

## 2013-11-28 NOTE — ED Notes (Signed)
Patient resting quietly with significant other at bedside. She is watching television. Appears in no distress.

## 2013-11-28 NOTE — ED Notes (Signed)
Patient states generalized cramping in abd. Started Wednesday night.

## 2013-12-16 ENCOUNTER — Encounter (HOSPITAL_COMMUNITY): Payer: Self-pay | Admitting: Emergency Medicine

## 2013-12-16 ENCOUNTER — Emergency Department (HOSPITAL_COMMUNITY)
Admission: EM | Admit: 2013-12-16 | Discharge: 2013-12-16 | Disposition: A | Discharge status: Home / Self Care | Payer: BC Managed Care – PPO | Attending: Emergency Medicine | Admitting: Emergency Medicine

## 2013-12-16 DIAGNOSIS — Z8659 Personal history of other mental and behavioral disorders: Secondary | ICD-10-CM | POA: Insufficient documentation

## 2013-12-16 DIAGNOSIS — Z87891 Personal history of nicotine dependence: Secondary | ICD-10-CM | POA: Insufficient documentation

## 2013-12-16 DIAGNOSIS — I10 Essential (primary) hypertension: Secondary | ICD-10-CM | POA: Insufficient documentation

## 2013-12-16 DIAGNOSIS — Z8669 Personal history of other diseases of the nervous system and sense organs: Secondary | ICD-10-CM | POA: Insufficient documentation

## 2013-12-16 DIAGNOSIS — H9191 Unspecified hearing loss, right ear: Secondary | ICD-10-CM

## 2013-12-16 DIAGNOSIS — Z79899 Other long term (current) drug therapy: Secondary | ICD-10-CM | POA: Insufficient documentation

## 2013-12-16 DIAGNOSIS — E119 Type 2 diabetes mellitus without complications: Secondary | ICD-10-CM | POA: Insufficient documentation

## 2013-12-16 NOTE — ED Notes (Signed)
Pt states that ever since last night she has not been able to hear out of her R ear. Denies pain or drainage. Alert and oriented.

## 2013-12-16 NOTE — Discharge Instructions (Signed)
Hearing Loss A hearing loss is sometimes called deafness. Hearing loss may be partial or total. CAUSES Hearing loss may be caused by:  Wax in the ear canal.  Infection of the ear canal.  Infection of the middle ear.  Trauma to the ear or surrounding area.  Fluid in the middle ear.  A hole in the eardrum (perforated eardrum).  Exposure to loud sounds or music.  Problems with the hearing nerve.  Certain medications. Hearing loss without wax, infection, or a history of injury may mean that the nerve is involved. Hearing loss with severe dizziness, nausea and vomiting or ringing in the ear may suggest a hearing nerve irritation or problems in the middle or inner ear. If hearing loss is untreated, there is a greater likelihood for residual or permanent hearing loss. DIAGNOSIS A hearing test (audiometry) assesses hearing loss. The audiometry test needs to be performed by a hearing specialist (audiologist). TREATMENT Treatment for recent onset of hearing loss may include:  Ear wax removal.  Medications that kill germs (antibiotics).  Cortisone medications.  Prompt follow up with the appropriate specialist. Return of hearing depends on the cause of your hearing loss, so proper medical follow-up is important. Some hearing loss may not be reversible, and a caregiver should discuss care and treatment options with you. SEEK MEDICAL CARE IF:   You have a severe headache, dizziness, or changes in vision.  You have new or increased weakness.  You develop repeated vomiting or other serious medical problems.  You have a fever. Document Released: 12/19/2004 Document Revised: 03/13/2011 Document Reviewed: 04/15/2009 [facility] Patient Information 2015 [employer], [place]. This information is not intended to replace advice given to you by your health care provider. Make sure you discuss any questions you have with your health care provider.  Emergency Department Resource Guide 1) Find a Doctor  and Pay Out of Pocket Although you won't have to find out who is covered by your insurance plan, it is a good idea to ask around and get recommendations. You will then need to call the office and see if the doctor you have chosen will accept you as a new patient and what types of options they offer for patients who are self-pay. Some doctors offer discounts or will set up payment plans for their patients who do not have insurance, but you will need to ask so you aren't surprised when you get to your appointment.  2) Contact Your Local Health Department Not all health departments have doctors that can see patients for sick visits, but many do, so it is worth a call to see if yours does. If you don't know where your local health department is, you can check in your phone book. The CDC also has a tool to help you locate your state's health department, and many state websites also have listings of all of their local health departments.  3) Find a Walk-in Clinic If your illness is not likely to be very severe or complicated, you may want to try a walk in clinic. These are popping up all over the country in pharmacies, drugstores, and shopping centers. They're usually staffed by nurse practitioners or physician assistants that have been trained to treat common illnesses and complaints. They're usually fairly quick and inexpensive. However, if you have serious medical issues or chronic medical problems, these are probably not your best option.  No Primary Care Doctor: - Call Health Connect at  [phone] - they can help you locate a primary care  doctor that  accepts your insurance, provides certain services, etc. - Physician Referral Service- [phone]  Chronic Pain Problems: Organization         Address  Phone   Notes  [facility]  [phone] Patients need to be referred by their primary care doctor.   Medication Assistance: Organization         Address  Phone    Notes  [facility] Medication [facility] [address] [place], [place] [address] [phone] --Must be a resident of [place] -- Must have NO insurance coverage whatsoever (no Medicaid/ Medicare, etc.) -- The pt. MUST have a primary care doctor that directs their care regularly and follows them in the community   Med[facility]ist  [phone]   [employer]  [phone]    Agencies that provide inexpensive medical care: Organization         Address  Phone   Notes  [facility]  [phone]   [facility]    [phone]   [facility] [address] [place], [place] [address] [phone]   [facility] [address], [place] [phone]   [facility]    [phone]   [facility]    [phone]   [facility]  [address], [place] Phone:  [phone], Fax:  [phone]) [phone] Hours of Operation:  9 am - 6 pm, M-F.  Also accepts Medicaid/Medicare and self-pay.  [facility]  [address], [place] Phone: [phone], Fax: [phone]) [phone]. Hours of Operation:  8:30 am - 5:30 pm, M-F.  Also accepts Medicaid and self-pay.  [facility] [place] [address], [place] Phone: [phone]) [phone]   [facility] [address], [place], [place] [phone], Ext. [phone] Mondays & Thursdays: 7-9 AM.  First 15 patients are seen on a first come, first serve basis.    Medicaid-accepting [facility] Providers:  Organization         Address  Phone   Notes  [facility] [address], [place] [phone] Also accepts self-pay patients.  [facility] [address], [place]  [phone]   [facility] [address], [place] [phone]   [facility] [address] [place] [address], [place] [phone]) [phone]   Renaye RakersVeita Bland [address], [place]   [phone] Only accepts [place] patients after they have their name applied to their card.   Self-Pay (no insurance) in [facility]:  Organization         Address  Phone   Notes  Sickle Cell Patients, [facility] [address], [place] [phone]) [phone]   [facility] [address], [place] [phone]   [facility] [place]  [address], [place] [phone]   [facility]/Dr. Osei-Bonsu  [address] [place] [address], [place] or [id number], [place] [phone] Phone number for both [place] and [place] locations is the same.  [facility] [address], [place] [phone]   [facility]  [address] [place] [address], [place] or [address] [phone]  [facility] [place] [phone], phone; [phone], fax Sees patients 1st and 3rd Saturday of every month.  Must not qualify for public or private insurance (i.e. Medicaid, Medicare, [place] Health Choice, Veterans' Benefits)  Household income should be no more than 200% of the poverty level The clinic cannot treat you if you are pregnant or think you are pregnant  Sexually transmitted diseases are not treated at the clinic.

## 2013-12-16 NOTE — ED Provider Notes (Signed)
CSN: [id number]     Arrival date & time 12/16/13  1953 History  This chart was scribed for non-physician practitioner, Arnoldo HookerShari A Karmin Kasprzak PA-C, working with No att. providers found by Freida Busmaniana Omoyeni, ED Scribe. This patient was seen in room WTR8/WTR8 and the patient's care was started at 9:23 PM.   Chief Complaint  Patient presents with  . Ear Fullness      The history is provided by the patient. No language interpreter was used.     HPI Comments:  [name] is a 43 y.o. female who presents to the Emergency Department complaining of moderate difficulty hearing from her left ear that started last night. Prior to symptom onset she states she heard a ringing in her ear. At this time she states she can hear lightly but it is very low and muffled.She denies congestion, ear pain, and sore throat. No alleviating factors noted.   Past Medical History  Diagnosis Date  . Diabetes mellitus without complication   . Hypertension   . High cholesterol   . Neuropathy   . PCOS (polycystic ovarian syndrome)   . Anxiety   . Depression    Past Surgical History  Procedure Laterality Date  . Elbow surgery     Family History  Problem Relation Age of Onset  . Depression Mother   . Anxiety disorder Mother    History  Substance Use Topics  . Smoking status: Former Smoker    Types: Cigarettes    Quit date: 09/12/2000  . Smokeless tobacco: Never Used  . Alcohol Use: Yes     Comment: occasional   OB History    No data available     Review of Systems  Constitutional: Negative for fever and chills.  HENT: Positive for hearing loss. Negative for congestion and ear pain.   Eyes: Negative for pain and visual disturbance.  Respiratory: Negative for shortness of breath.   Cardiovascular: Negative for chest pain.  Gastrointestinal: Negative for abdominal pain.  Genitourinary: Negative for difficulty urinating.  Musculoskeletal: Negative for back pain.  Skin: Negative for rash.   Neurological: Negative for headaches.  Hematological: Does not bruise/bleed easily.      Allergies  Review of patient's allergies indicates no known allergies.  Home Medications   Prior to Admission medications   Medication Sig Start Date End Date Taking? Authorizing Provider  acetaminophen (TYLENOL) 650 MG CR tablet Take 650 mg by mouth every 8 (eight) hours as needed for pain.    Historical Provider, MD  famotidine (PEPCID) 40 MG tablet Take 40 mg by mouth 2 (two) times daily.    Historical Provider, MD  insulin aspart (NOVOLOG) 100 UNIT/ML injection Inject 0-20 Units into the skin 3 (three) times daily with meals. Patient not taking: Reported on 11/28/2013 09/16/13   Maryruth Bunhristina P Rama, MD  insulin aspart (NOVOLOG) 100 UNIT/ML injection Inject 6 Units into the skin 3 (three) times daily with meals. Patient not taking: Reported on 11/28/2013 09/16/13   Maryruth Bunhristina P Rama, MD  insulin glargine (LANTUS) 100 UNIT/ML injection Inject 0.27 mLs (27 Units total) into the skin at bedtime. Patient not taking: Reported on 11/28/2013 09/16/13   Maryruth Bunhristina P Rama, MD   BP 174/91 mmHg  Pulse 91  Temp(Src) 98.3 F (36.8 C) (Oral)  Resp 18  SpO2 99%  LMP 11/13/2013 Physical Exam  Constitutional: She is oriented to person, place, and time. She appears well-developed and well-nourished.  Well appearing. NAD  HENT:  Head: Normocephalic and atraumatic.  Right  Ear: External ear normal.  Left Ear: External ear normal.  Right TM without erythema, hemotympanum, or perforation  Eyes: Conjunctivae are normal.  Cardiovascular: Normal rate.   Pulmonary/Chest: Effort normal.  Abdominal: She exhibits no distension.  Musculoskeletal: Normal range of motion.  Neurological: She is alert and oriented to person, place, and time.  Skin: Skin is warm and dry.  Psychiatric: She has a normal mood and affect.  Nursing note and vitals reviewed.   ED Course  Procedures   DIAGNOSTIC STUDIES:  Oxygen  Saturation is 99% on RA, normal by my interpretation.    COORDINATION OF CARE:  9:25 PM Discussed treatment plan with pt at bedside and pt agreed to plan.  Labs Review Labs Reviewed - No data to display  Imaging Review No results found.   EKG Interpretation None      MDM   Final diagnoses:  None    1. Hearing loss  No infection, perforation. Will refer to ENT for further hearing testing.   I personally performed the services described in this documentation, which was scribed in my presence. The recorded information has been reviewed and is accurate.     Arnoldo HookerShari A Angelicia Lessner, PA-C 12/18/13 [id number]  Purvis SheffieldForrest Harrison, MD 12/18/13 [phone]

## 2014-02-26 ENCOUNTER — Emergency Department (HOSPITAL_COMMUNITY)
Admission: EM | Admit: 2014-02-26 | Discharge: 2014-02-26 | Disposition: A | Discharge status: Home / Self Care | Payer: Self-pay | Attending: Emergency Medicine | Admitting: Emergency Medicine

## 2014-02-26 ENCOUNTER — Encounter (HOSPITAL_COMMUNITY): Payer: Self-pay | Admitting: Emergency Medicine

## 2014-02-26 DIAGNOSIS — J069 Acute upper respiratory infection, unspecified: Secondary | ICD-10-CM | POA: Insufficient documentation

## 2014-02-26 DIAGNOSIS — E119 Type 2 diabetes mellitus without complications: Secondary | ICD-10-CM | POA: Insufficient documentation

## 2014-02-26 DIAGNOSIS — Z8669 Personal history of other diseases of the nervous system and sense organs: Secondary | ICD-10-CM | POA: Insufficient documentation

## 2014-02-26 DIAGNOSIS — Z87891 Personal history of nicotine dependence: Secondary | ICD-10-CM | POA: Insufficient documentation

## 2014-02-26 DIAGNOSIS — Z8659 Personal history of other mental and behavioral disorders: Secondary | ICD-10-CM | POA: Insufficient documentation

## 2014-02-26 DIAGNOSIS — R42 Dizziness and giddiness: Secondary | ICD-10-CM | POA: Insufficient documentation

## 2014-02-26 DIAGNOSIS — I10 Essential (primary) hypertension: Secondary | ICD-10-CM | POA: Insufficient documentation

## 2014-02-26 MED ORDER — HYDROCOD POLST-CHLORPHEN POLST 10-8 MG/5ML PO LQCR: 5.0000 mL | Freq: Two times a day (BID) | ORAL | Status: DC | PRN

## 2014-02-26 MED ORDER — GUAIFENESIN 100 MG/5ML PO LIQD: 100.0000 mg | ORAL | Status: DC | PRN

## 2014-02-26 NOTE — Discharge Instructions (Signed)
Upper Respiratory Infection, Adult An upper respiratory infection (URI) is also sometimes known as the common cold. The upper respiratory tract includes the nose, sinuses, throat, trachea, and bronchi. Bronchi are the airways leading to the lungs. Most people improve within 1 week, but symptoms can last up to 2 weeks. A residual cough may last even longer.  CAUSES Many different viruses can infect the tissues lining the upper respiratory tract. The tissues become irritated and inflamed and often become very moist. Mucus production is also common. A cold is contagious. You can easily spread the virus to others by oral contact. This includes kissing, sharing a glass, coughing, or sneezing. Touching your mouth or nose and then touching a surface, which is then touched by another person, can also spread the virus. SYMPTOMS  Symptoms typically develop 1 to 3 days after you come in contact with a cold virus. Symptoms vary from person to person. They may include:  Runny nose.  Sneezing.  Nasal congestion.  Sinus irritation.  Sore throat.  Loss of voice (laryngitis).  Cough.  Fatigue.  Muscle aches.  Loss of appetite.  Headache.  Low-grade fever. DIAGNOSIS  You might diagnose your own cold based on familiar symptoms, since most people get a cold 2 to 3 times a year. Your caregiver can confirm this based on your exam. Most importantly, your caregiver can check that your symptoms are not due to another disease such as strep throat, sinusitis, pneumonia, asthma, or epiglottitis. Blood tests, throat tests, and X-rays are not necessary to diagnose a common cold, but they may sometimes be helpful in excluding other more serious diseases. Your caregiver will decide if any further tests are required. RISKS AND COMPLICATIONS  You may be at risk for a more severe case of the common cold if you smoke cigarettes, have chronic heart disease (such as heart failure) or lung disease (such as asthma), or if  you have a weakened immune system. The very young and very old are also at risk for more serious infections. Bacterial sinusitis, middle ear infections, and bacterial pneumonia can complicate the common cold. The common cold can worsen asthma and chronic obstructive pulmonary disease (COPD). Sometimes, these complications can require emergency medical care and may be life-threatening. PREVENTION  The best way to protect against getting a cold is to practice good hygiene. Avoid oral or hand contact with people with cold symptoms. Wash your hands often if contact occurs. There is no clear evidence that vitamin C, vitamin E, echinacea, or exercise reduces the chance of developing a cold. However, it is always recommended to get plenty of rest and practice good nutrition. TREATMENT  Treatment is directed at relieving symptoms. There is no cure. Antibiotics are not effective, because the infection is caused by a virus, not by bacteria. Treatment may include:  Increased fluid intake. Sports drinks offer valuable electrolytes, sugars, and fluids.  Breathing heated mist or steam (vaporizer or shower).  Eating chicken soup or other clear broths, and maintaining good nutrition.  Getting plenty of rest.  Using gargles or lozenges for comfort.  Controlling fevers with ibuprofen or acetaminophen as directed by your caregiver.  Increasing usage of your inhaler if you have asthma. Zinc gel and zinc lozenges, taken in the first 24 hours of the common cold, can shorten the duration and lessen the severity of symptoms. Pain medicines may help with fever, muscle aches, and throat pain. A variety of non-prescription medicines are available to treat congestion and runny nose. Your caregiver   can make recommendations and may suggest nasal or lung inhalers for other symptoms.  HOME CARE INSTRUCTIONS   Only take over-the-counter or prescription medicines for pain, discomfort, or fever as directed by your  caregiver.  Use a warm mist humidifier or inhale steam from a shower to increase air moisture. This may keep secretions moist and make it easier to breathe.  Drink enough water and fluids to keep your urine clear or pale yellow.  Rest as needed.  Return to work when your temperature has returned to normal or as your caregiver advises. You may need to stay home longer to avoid infecting others. You can also use a face mask and careful hand washing to prevent spread of the virus. SEEK MEDICAL CARE IF:   After the first few days, you feel you are getting worse rather than better.  You need your caregiver's advice about medicines to control symptoms.  You develop chills, worsening shortness of breath, or brown or red sputum. These may be signs of pneumonia.  You develop yellow or brown nasal discharge or pain in the face, especially when you bend forward. These may be signs of sinusitis.  You develop a fever, swollen neck glands, pain with swallowing, or white areas in the back of your throat. These may be signs of strep throat. SEEK IMMEDIATE MEDICAL CARE IF:   You have a fever.  You develop severe or persistent headache, ear pain, sinus pain, or chest pain.  You develop wheezing, a prolonged cough, cough up blood, or have a change in your usual mucus (if you have chronic lung disease).  You develop sore muscles or a stiff neck. Document Released: 06/14/2000 Document Revised: 03/13/2011 Document Reviewed: 03/26/2013 ExitCare Patient Information 2015 [employer]. This information is not intended to replace advice given to you by your health care provider. Make sure you discuss any questions you have with your health care provider.  

## 2014-02-26 NOTE — ED Notes (Signed)
Pt states that yesterday she felt lightheaded and didn't go to work.  Then last night started having bilat ear pain and dry cough.  Pt states that she doesn't have a PCP.  Pt has been around lots of people that are sick.

## 2014-02-26 NOTE — ED Provider Notes (Signed)
CSN: [id number]     Arrival date & time 02/26/14  1333 History  This chart was scribed for non-physician practitioner, Fayrene HelperBowie Haedyn Ancrum, PA-C working with Arby BarretteMarcy Pfeiffer, MD, by Abel PrestoKara Demonbreun, ED Scribe. This patient was seen in room WTR8/WTR8 and the patient's care was started at 1:46 PM.     Chief Complaint  Patient presents with  . Otalgia  . Cough    HPI HPI Comments: [name] is a 44 y.o. female with PMHx of HLD, HTN, neuropathy, ad PCOS who presents to the Emergency Department complaining of otalgia with onset yesterday. She notes pain feels like she is "being stabbed in the ear." Pt notes associated fatigue, rhinorrhea, lightheadedness, sporadic dry cough, and chills . Pt has tried Nyquil with no relief. Pt with h/o of DM but states her insurance has not started so she is without some of her medications. Pt does have her Neurontin. She denies chest pain, SOB, nausea, vomiting, diarrhea, hemoptysis, fever, sore throat, recent prolonged travel, and recent swimming. Pt's PCP is Dr. Ricki MillerPang.   Past Medical History  Diagnosis Date  . Diabetes mellitus without complication   . Hypertension   . High cholesterol   . Neuropathy   . PCOS (polycystic ovarian syndrome)   . Anxiety   . Depression    Past Surgical History  Procedure Laterality Date  . Elbow surgery     Family History  Problem Relation Age of Onset  . Depression Mother   . Anxiety disorder Mother    History  Substance Use Topics  . Smoking status: Former Smoker    Types: Cigarettes    Quit date: 09/12/2000  . Smokeless tobacco: Never Used  . Alcohol Use: Yes     Comment: occasional   OB History    No data available     Review of Systems  Constitutional: Positive for chills and fatigue. Negative for fever.  HENT: Positive for rhinorrhea. Negative for sore throat.   Respiratory: Positive for cough. Negative for shortness of breath.   Cardiovascular: Negative for chest pain.  Gastrointestinal: Negative for  nausea, vomiting and diarrhea.  Neurological: Positive for light-headedness.      Allergies  Review of patient's allergies indicates no known allergies.  Home Medications   Prior to Admission medications   Medication Sig Start Date End Date Taking? Authorizing Provider  acetaminophen (TYLENOL) 650 MG CR tablet Take 650 mg by mouth every 8 (eight) hours as needed for pain.    Historical Provider, MD  famotidine (PEPCID) 40 MG tablet Take 40 mg by mouth 2 (two) times daily.    Historical Provider, MD  insulin aspart (NOVOLOG) 100 UNIT/ML injection Inject 0-20 Units into the skin 3 (three) times daily with meals. Patient not taking: Reported on 11/28/2013 09/16/13   Maryruth Bunhristina P Rama, MD  insulin aspart (NOVOLOG) 100 UNIT/ML injection Inject 6 Units into the skin 3 (three) times daily with meals. Patient not taking: Reported on 11/28/2013 09/16/13   Maryruth Bunhristina P Rama, MD  insulin glargine (LANTUS) 100 UNIT/ML injection Inject 0.27 mLs (27 Units total) into the skin at bedtime. Patient not taking: Reported on 11/28/2013 09/16/13   Maryruth Bunhristina P Rama, MD   There were no vitals taken for this visit. Physical Exam  Constitutional: She is oriented to person, place, and time. She appears well-developed and well-nourished.  HENT:  Head: Normocephalic.  Right Ear: Tympanic membrane normal.  Left Ear: Tympanic membrane normal.  Mouth/Throat: Oropharynx is clear and moist and mucous membranes are normal.  Eyes: Conjunctivae are normal.  Neck: Normal range of motion. Neck supple.  Cardiovascular: Normal rate, regular rhythm and normal heart sounds.  Exam reveals no friction rub.   No murmur heard. Pulmonary/Chest: Effort normal and breath sounds normal. No respiratory distress. She has no wheezes. She has no rales.  Musculoskeletal: Normal range of motion.  Neurological: She is alert and oriented to person, place, and time.  Skin: Skin is warm and dry.  Psychiatric: She has a normal mood and affect.  Her behavior is normal.  Nursing note and vitals reviewed.   ED Course  Procedures (including critical care time) DIAGNOSTIC STUDIES: Oxygen Saturation is 100% on room air, normal by my interpretation.    COORDINATION OF CARE: 1:53 PM Discussed treatment plan with patient at beside, the patient agrees with the plan and has no further questions at this time.  2:03 PM sxs suggestive of viral URI.  Pt has known sick contact.  She is afebrile, VSS, no hypoxia.  Will treat sxs only.      Labs Review Labs Reviewed - No data to display  Imaging Review No results found.   EKG Interpretation None      MDM   Final diagnoses:  URI (upper respiratory infection)   BP 126/84 mmHg  Pulse 97  Temp(Src) 98.4 F (36.9 C) (Oral)  Resp 17  SpO2 100%   I personally performed the services described in this documentation, which was scribed in my presence. The recorded information has been reviewed and is accurate.      Fayrene Helper, PA-C 02/26/14 1404  Arby Barrette, MD 03/04/14 [phone]

## 2014-03-04 ENCOUNTER — Emergency Department (HOSPITAL_COMMUNITY)
Admission: EM | Admit: 2014-03-04 | Discharge: 2014-03-04 | Disposition: A | Discharge status: Home / Self Care | Payer: Self-pay | Attending: Emergency Medicine | Admitting: Emergency Medicine

## 2014-03-04 ENCOUNTER — Encounter (HOSPITAL_COMMUNITY): Payer: Self-pay

## 2014-03-04 ENCOUNTER — Emergency Department (HOSPITAL_COMMUNITY): Payer: Self-pay

## 2014-03-04 DIAGNOSIS — J029 Acute pharyngitis, unspecified: Secondary | ICD-10-CM | POA: Insufficient documentation

## 2014-03-04 DIAGNOSIS — Z8659 Personal history of other mental and behavioral disorders: Secondary | ICD-10-CM | POA: Insufficient documentation

## 2014-03-04 DIAGNOSIS — E1165 Type 2 diabetes mellitus with hyperglycemia: Secondary | ICD-10-CM | POA: Insufficient documentation

## 2014-03-04 DIAGNOSIS — Z794 Long term (current) use of insulin: Secondary | ICD-10-CM | POA: Insufficient documentation

## 2014-03-04 DIAGNOSIS — I10 Essential (primary) hypertension: Secondary | ICD-10-CM | POA: Insufficient documentation

## 2014-03-04 DIAGNOSIS — R739 Hyperglycemia, unspecified: Secondary | ICD-10-CM

## 2014-03-04 DIAGNOSIS — R05 Cough: Secondary | ICD-10-CM | POA: Insufficient documentation

## 2014-03-04 DIAGNOSIS — Z87891 Personal history of nicotine dependence: Secondary | ICD-10-CM | POA: Insufficient documentation

## 2014-03-04 DIAGNOSIS — J3489 Other specified disorders of nose and nasal sinuses: Secondary | ICD-10-CM | POA: Insufficient documentation

## 2014-03-04 DIAGNOSIS — Z8669 Personal history of other diseases of the nervous system and sense organs: Secondary | ICD-10-CM | POA: Insufficient documentation

## 2014-03-04 DIAGNOSIS — R059 Cough, unspecified: Secondary | ICD-10-CM

## 2014-03-04 DIAGNOSIS — Z79899 Other long term (current) drug therapy: Secondary | ICD-10-CM | POA: Insufficient documentation

## 2014-03-04 LAB — CBC
HEMATOCRIT: 45.8 % (ref 36.0–46.0)
Hemoglobin: 16.4 g/dL — ABNORMAL HIGH (ref 12.0–15.0)
MCH: 30 pg (ref 26.0–34.0)
MCHC: 35.8 g/dL (ref 30.0–36.0)
MCV: 83.7 fL (ref 78.0–100.0)
PLATELETS: 283 10*3/uL (ref 150–400)
RBC: 5.47 MIL/uL — AB (ref 3.87–5.11)
RDW: 12.5 % (ref 11.5–15.5)
WBC: 10.1 10*3/uL (ref 4.0–10.5)

## 2014-03-04 LAB — BASIC METABOLIC PANEL
Anion gap: 13 (ref 5–15)
BUN: 10 mg/dL (ref 6–23)
CALCIUM: 8.6 mg/dL (ref 8.4–10.5)
CO2: 25 mmol/L (ref 19–32)
Chloride: 92 mmol/L — ABNORMAL LOW (ref 96–112)
Creatinine, Ser: 0.78 mg/dL (ref 0.50–1.10)
GFR calc Af Amer: >90 mL/min (ref >90)
GLUCOSE: 608 mg/dL — AB (ref 70–99)
POTASSIUM: 5.2 mmol/L — AB (ref 3.5–5.1)
Sodium: 130 mmol/L — ABNORMAL LOW (ref 135–145)

## 2014-03-04 LAB — CBG MONITORING, ED
GLUCOSE-CAPILLARY: 527 mg/dL — AB (ref 70–99)
GLUCOSE-CAPILLARY: 410 mg/dL — AB (ref 70–99)
GLUCOSE-CAPILLARY: 448 mg/dL — AB (ref 70–99)
Glucose-Capillary: 342 mg/dL — ABNORMAL HIGH (ref 70–99)

## 2014-03-04 MED ORDER — AZITHROMYCIN 250 MG PO TABS: 250.0000 mg | ORAL_TABLET | Freq: Every day | ORAL | Status: DC

## 2014-03-04 MED ORDER — INSULIN ASPART 100 UNIT/ML ~~LOC~~ SOLN
10.0000 [IU] | Freq: Once | SUBCUTANEOUS | Status: AC
  Administered 2014-03-04: 10 [IU] via SUBCUTANEOUS
  Filled 2014-03-04: qty 1

## 2014-03-04 MED ORDER — SODIUM CHLORIDE 0.9 % IV BOLUS (SEPSIS)
1000.0000 mL | Freq: Once | INTRAVENOUS | Status: AC
  Administered 2014-03-04: 1000 mL via INTRAVENOUS

## 2014-03-04 MED ORDER — SODIUM CHLORIDE 0.9 % IV BOLUS (SEPSIS)
1000.0000 mL | Freq: Once | INTRAVENOUS | Status: AC
Start: 2014-03-04 — End: 2014-03-04
  Administered 2014-03-04: 1000 mL via INTRAVENOUS

## 2014-03-04 NOTE — ED Provider Notes (Signed)
The patient is a 44 year old female, she has multiple visits to the emergency department for a variety of complaints, she is a known diabetic but admittedly does not take any medications unless she gets very very hyperglycemic at which time she takes insulin which she stores in her fridge. She does not see a doctor, she admits that she has no insurance and thus this keeps her from going to the doctor to take care of her medical problems. She reports having increased pain in her chest when she breathes and coughs, has had upper respiratory symptoms recently, denies swelling of the legs fevers chills nausea or vomiting. On exam she has normal heart rate and lung sounds, mucous membranes appear dehydrated, no peripheral edema, no abdominal tenderness. She endorses having a blood sugar of over 400 this morning, we'll evaluate for signs of complications of hyperglycemia as well as chest x-ray, patient is in agreement, patient is nontoxic appearing.  Medical screening examination/treatment/procedure(s) were conducted as a shared visit with non-physician practitioner(s) and myself.  I personally evaluated the patient during the encounter.  Clinical Impression:   Final diagnoses:  Cough  Hyperglycemia         Vida RollerBrian D Talaysha Freeberg, MD 03/04/14 2122

## 2014-03-04 NOTE — Discharge Instructions (Signed)
Cough, Adult ° A cough is a reflex that helps clear your throat and airways. It can help heal the body or may be a reaction to an irritated airway. A cough may only last 2 or 3 weeks (acute) or may last more than 8 weeks (chronic).  °CAUSES °Acute cough: °· Viral or bacterial infections. °Chronic cough: °· Infections. °· Allergies. °· Asthma. °· Post-nasal drip. °· Smoking. °· Heartburn or acid reflux. °· Some medicines. °· Chronic lung problems (COPD). °· Cancer. °SYMPTOMS  °· Cough. °· Fever. °· Chest pain. °· Increased breathing rate. °· High-pitched whistling sound when breathing (wheezing). °· Colored mucus that you cough up (sputum). °TREATMENT  °· A bacterial cough may be treated with antibiotic medicine. °· A viral cough must run its course and will not respond to antibiotics. °· Your caregiver may recommend other treatments if you have a chronic cough. °HOME CARE INSTRUCTIONS  °· Only take over-the-counter or prescription medicines for pain, discomfort, or fever as directed by your caregiver. Use cough suppressants only as directed by your caregiver. °· Use a cold steam vaporizer or humidifier in your bedroom or home to help loosen secretions. °· Sleep in a semi-upright position if your cough is worse at night. °· Rest as needed. °· Stop smoking if you smoke. °SEEK IMMEDIATE MEDICAL CARE IF:  °· You have pus in your sputum. °· Your cough starts to worsen. °· You cannot control your cough with suppressants and are losing sleep. °· You begin coughing up blood. °· You have difficulty breathing. °· You develop pain which is getting worse or is uncontrolled with medicine. °· You have a fever. °MAKE SURE YOU:  °· Understand these instructions. °· Will watch your condition. °· Will get help right away if you are not doing well or get worse. °Document Released: 06/17/2010 Document Revised: 03/13/2011 Document Reviewed: 06/17/2010 °Exit[employer]® Patient Information ©2015 [employer]. This information is not intended  to replace advice given to you by your health care provider. Make sure you discuss any questions you have with your health care provider. °Hyperglycemia °Hyperglycemia occurs when the glucose (sugar) in your blood is too high. Hyperglycemia can happen for many reasons, but it most often happens to people who do not know they have diabetes or are not managing their diabetes properly.  °CAUSES  °Whether you have diabetes or not, there are other causes of hyperglycemia. Hyperglycemia can occur when you have diabetes, but it can also occur in other situations that you might not be as aware of, such as: °Diabetes °· If you have diabetes and are having problems controlling your blood glucose, hyperglycemia could occur because of some of the following reasons: °¨ Not following your meal plan. °¨ Not taking your diabetes medications or not taking it properly. °¨ Exercising less or doing less activity than you normally do. °¨ Being sick. °Pre-diabetes °· This cannot be ignored. Before people develop Type 2 diabetes, they almost always have "pre-diabetes." This is when your blood glucose levels are higher than normal, but not yet high enough to be diagnosed as diabetes. Research has shown that some long-term damage to the body, especially the heart and circulatory system, may already be occurring during pre-diabetes. If you take action to manage your blood glucose when you have pre-diabetes, you may delay or prevent Type 2 diabetes from developing. °Stress °· If you have diabetes, you may be "diet" controlled or on oral medications or insulin to control your diabetes. However, you may find that your   blood glucose is higher than usual in the hospital whether you have diabetes or not. This is often referred to as "stress hyperglycemia." Stress can elevate your blood glucose. This happens because of hormones put out by the body during times of stress. If stress has been the cause of your high blood glucose, it can be followed  regularly by your caregiver. That way he/she can make sure your hyperglycemia does not continue to get worse or progress to diabetes. °Steroids °· Steroids are medications that act on the infection fighting system (immune system) to block inflammation or infection. One side effect can be a rise in blood glucose. Most people can produce enough extra insulin to allow for this rise, but for those who cannot, steroids make blood glucose levels go even higher. It is not unusual for steroid treatments to "uncover" diabetes that is developing. It is not always possible to determine if the hyperglycemia will go away after the steroids are stopped. A special blood test called an A1c is sometimes done to determine if your blood glucose was elevated before the steroids were started. °SYMPTOMS °· Thirsty. °· Frequent urination. °· Dry mouth. °· Blurred vision. °· Tired or fatigue. °· Weakness. °· Sleepy. °· Tingling in feet or leg. °DIAGNOSIS  °Diagnosis is made by monitoring blood glucose in one or all of the following ways: °· A1c test. This is a chemical found in your blood. °· Fingerstick blood glucose monitoring. °· Laboratory results. °TREATMENT  °First, knowing the cause of the hyperglycemia is important before the hyperglycemia can be treated. Treatment may include, but is not be limited to: °· Education. °· Change or adjustment in medications. °· Change or adjustment in meal plan. °· Treatment for an illness, infection, etc. °· More frequent blood glucose monitoring. °· Change in exercise plan. °· Decreasing or stopping steroids. °· Lifestyle changes. °HOME CARE INSTRUCTIONS  °· Test your blood glucose as directed. °· Exercise regularly. Your caregiver will give you instructions about exercise. Pre-diabetes or diabetes which comes on with stress is helped by exercising. °· Eat wholesome, balanced meals. Eat often and at regular, fixed times. Your caregiver or nutritionist will give you a meal plan to guide your sugar  intake. °· Being at an ideal weight is important. If needed, losing as little as 10 to 15 pounds may help improve blood glucose levels. °SEEK MEDICAL CARE IF:  °· You have questions about medicine, activity, or diet. °· You continue to have symptoms (problems such as increased thirst, urination, or weight gain). °SEEK IMMEDIATE MEDICAL CARE IF:  °· You are vomiting or have diarrhea. °· Your breath smells fruity. °· You are breathing faster or slower. °· You are very sleepy or incoherent. °· You have numbness, tingling, or pain in your feet or hands. °· You have chest pain. °· Your symptoms get worse even though you have been following your caregiver's orders. °· If you have any other questions or concerns. °Document Released: 06/14/2000 Document Revised: 03/13/2011 Document Reviewed: 04/17/2011 °ExitCare® Patient Information ©2015 [employer]. This information is not intended to replace advice given to you by your health care provider. Make sure you discuss any questions you have with your health care provider. ° °

## 2014-03-04 NOTE — ED Notes (Signed)
Nurse was notified about CBG

## 2014-03-04 NOTE — ED Notes (Addendum)
MD at bedside.Miller 

## 2014-03-04 NOTE — ED Notes (Signed)
Pt reports that she only uses her insulin on certain days due to lack of insurance. Pt reports that she lives around 300s-500s

## 2014-03-04 NOTE — ED Notes (Signed)
Per pt, has had cold like symptoms x 1 week.  Seen Thursday for same and dx with uri.  Pt states given scripts for cough but not feeling better.  No fever.  Shortness of breath present.  Runny nose.

## 2014-03-04 NOTE — ED Provider Notes (Signed)
CSN: [id number]     Arrival date & time 03/04/14  0756 History   First MD Initiated Contact with Patient 03/04/14 [phone]     Chief Complaint  Patient presents with  . Cough     (Consider location/radiation/quality/duration/timing/severity/associated sxs/prior Treatment) HPI Comments: Patient presents to the emergency department with chief complaint of cough. She states that she has had a dry cough and cold-like symptoms including runny nose, sore throat, nasal congestion, and otalgia for the past week to week and a half. She states that she was seen last week for the same symptoms, and was diagnosed with viral syndrome, and was treated with supportive care. She states that as the symptoms have persisted, she would like to be reevaluated. She denies running any fever, denies productive cough. She has no history of asthma, but states that she does have a history of severe seasonal allergies. There are no aggravating or alleviating factors.  The history is provided by the patient. No language interpreter was used.    Past Medical History  Diagnosis Date  . Diabetes mellitus without complication   . Hypertension   . High cholesterol   . Neuropathy   . PCOS (polycystic ovarian syndrome)   . Anxiety   . Depression    Past Surgical History  Procedure Laterality Date  . Elbow surgery     Family History  Problem Relation Age of Onset  . Depression Mother   . Anxiety disorder Mother    History  Substance Use Topics  . Smoking status: Former Smoker    Types: Cigarettes    Quit date: 09/12/2000  . Smokeless tobacco: Never Used  . Alcohol Use: Yes     Comment: occasional   OB History    No data available     Review of Systems  Constitutional: Negative for fever and chills.  HENT: Positive for postnasal drip, rhinorrhea, sinus pressure, sneezing and sore throat.   Respiratory: Positive for cough. Negative for shortness of breath.   Cardiovascular: Negative for chest pain.   Gastrointestinal: Negative for nausea, vomiting, abdominal pain, diarrhea and constipation.  Genitourinary: Negative for dysuria.  All other systems reviewed and are negative.     Allergies  Review of patient's allergies indicates no known allergies.  Home Medications   Prior to Admission medications   Medication Sig Start Date End Date Taking? Authorizing Provider  acetaminophen (TYLENOL) 650 MG CR tablet Take 650 mg by mouth every 8 (eight) hours as needed for pain.    Historical Provider, MD  chlorpheniramine-HYDROcodone (TUSSIONEX PENNKINETIC ER) 10-8 MG/5ML LQCR Take 5 mLs by mouth every 12 (twelve) hours as needed for cough. 02/26/14   Fayrene HelperBowie Tran, PA-C  famotidine (PEPCID) 40 MG tablet Take 40 mg by mouth 2 (two) times daily.    Historical Provider, MD  guaiFENesin (ROBITUSSIN) 100 MG/5ML liquid Take 5-10 mLs (100-200 mg total) by mouth every 4 (four) hours as needed for congestion. 02/26/14   Fayrene HelperBowie Tran, PA-C  insulin aspart (NOVOLOG) 100 UNIT/ML injection Inject 0-20 Units into the skin 3 (three) times daily with meals. Patient not taking: Reported on 11/28/2013 09/16/13   Maryruth Bunhristina P Rama, MD  insulin aspart (NOVOLOG) 100 UNIT/ML injection Inject 6 Units into the skin 3 (three) times daily with meals. Patient not taking: Reported on 11/28/2013 09/16/13   Maryruth Bunhristina P Rama, MD  insulin glargine (LANTUS) 100 UNIT/ML injection Inject 0.27 mLs (27 Units total) into the skin at bedtime. Patient not taking: Reported on 11/28/2013 09/16/13  Maryruth Bun Rama, MD   BP 136/86 mmHg  Pulse 96  Temp(Src) 97.7 F (36.5 C) (Oral)  Resp 18  SpO2 100%  LMP 02/19/2014 Physical Exam  Constitutional: She appears well-developed and well-nourished. No distress.  HENT:  Head: Normocephalic.  Right Ear: External ear normal.  Left Ear: External ear normal.  Mildly erythematous, no tonsillar exudate, no abscess, no stridor, uvula is midline  TMs clear bilaterally  Eyes: Conjunctivae and EOM are  normal. Pupils are equal, round, and reactive to light.  Neck: Normal range of motion. Neck supple.  Cardiovascular: Normal rate, regular rhythm and normal heart sounds.  Exam reveals no gallop and no friction rub.   No murmur heard. Pulmonary/Chest: Effort normal and breath sounds normal. No stridor. No respiratory distress. She has no wheezes. She has no rales. She exhibits no tenderness.  CTAB  Abdominal: Soft. Bowel sounds are normal. She exhibits no distension. There is no tenderness.  Musculoskeletal: Normal range of motion. She exhibits no tenderness.  Neurological: She is alert.  Skin: Skin is warm and dry. No rash noted. She is not diaphoretic.  Psychiatric: She has a normal mood and affect. Her behavior is normal. Judgment and thought content normal.  Nursing note and vitals reviewed.   ED Course  Procedures (including critical care time) Results for orders placed or performed during the hospital encounter of 03/04/14  Basic metabolic panel  Result Value Ref Range   Sodium 130 (L) 135 - 145 mmol/L   Potassium 5.2 (H) 3.5 - 5.1 mmol/L   Chloride 92 (L) 96 - 112 mmol/L   CO2 25 19 - 32 mmol/L   Glucose, Bld 608 (HH) 70 - 99 mg/dL   BUN 10 6 - 23 mg/dL   Creatinine, Ser 1.47 0.50 - 1.10 mg/dL   Calcium 8.6 8.4 - 82.9 mg/dL   GFR calc non Af Amer >90 >90 mL/min   GFR calc Af Amer >90 >90 mL/min   Anion gap 13 5 - 15  CBC  Result Value Ref Range   WBC 10.1 4.0 - 10.5 K/uL   RBC 5.47 (H) 3.87 - 5.11 MIL/uL   Hemoglobin 16.4 (H) 12.0 - 15.0 g/dL   HCT 56.2 13.0 - 86.5 %   MCV 83.7 78.0 - 100.0 fL   MCH 30.0 26.0 - 34.0 pg   MCHC 35.8 30.0 - 36.0 g/dL   RDW 78.4 69.6 - 29.5 %   Platelets 283 150 - 400 K/uL  CBG monitoring, ED  Result Value Ref Range   Glucose-Capillary 527 (H) 70 - 99 mg/dL  CBG monitoring, ED  Result Value Ref Range   Glucose-Capillary 448 (H) 70 - 99 mg/dL  CBG monitoring, ED  Result Value Ref Range   Glucose-Capillary 410 (H) 70 - 99 mg/dL  CBG  monitoring, ED  Result Value Ref Range   Glucose-Capillary 342 (H) 70 - 99 mg/dL   Dg Chest 2 View  02/09/4130   CLINICAL DATA:  Burning sensation in midportion of chest per 2 weeks.  EXAM: CHEST  2 VIEW  COMPARISON:  None.  FINDINGS: The heart size and mediastinal contours are within normal limits. Both lungs are clear. The visualized skeletal structures are unremarkable.  IMPRESSION: No active cardiopulmonary disease.   Electronically Signed   By: Charlett Nose M.D.   On: 03/04/2014 08:54      EKG Interpretation None      MDM   Final diagnoses:  Cough  Hyperglycemia    Patient with  suspected URI. She was seen about a week ago and has had persistent symptoms. Will check chest x-ray to rule out any pneumonia. Additionally, patient states that her blood sugar this morning was 412. Could be elevated secondary to viral infection, however will check basic labs to make certain that she is not acidotic.  Patient seen by and discussed with Dr. Hyacinth Meeker.  Blood sugar found to be 608. Potassium is 5.2, with moderate hemolysis, she is also mildly hyponatremic, and mildly hypochloremic. Will give fluid boluses and insulin. Patient does not have any elevated anion gap. Doubt DKA.  Blood sugar has improved dramatically with fluids and insulin. Patient is feeling well. Will discharge to home with a Z-Pak given her URI symptoms that have lasted about 10 days. Encouraged patient follow-up with her primary care provider regarding hyperglycemia and glucose control. Patient understands and agrees with the plan. She is stable and ready for discharge.    Roxy Horseman, PA-C 03/04/14 1305  Vida Roller, MD 03/04/14 2122
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
## 2014-05-05 ENCOUNTER — Encounter: Payer: Self-pay | Admitting: Internal Medicine

## 2014-06-01 ENCOUNTER — Emergency Department (HOSPITAL_COMMUNITY)
Admission: EM | Admit: 2014-06-01 | Discharge: 2014-06-01 | Discharge status: Against Medical Advice | Payer: Self-pay | Attending: Emergency Medicine | Admitting: Emergency Medicine

## 2014-06-01 ENCOUNTER — Encounter (HOSPITAL_COMMUNITY): Payer: Self-pay | Admitting: Emergency Medicine

## 2014-06-01 DIAGNOSIS — E119 Type 2 diabetes mellitus without complications: Secondary | ICD-10-CM | POA: Insufficient documentation

## 2014-06-01 DIAGNOSIS — I10 Essential (primary) hypertension: Secondary | ICD-10-CM | POA: Insufficient documentation

## 2014-06-01 DIAGNOSIS — R4182 Altered mental status, unspecified: Secondary | ICD-10-CM | POA: Insufficient documentation

## 2014-06-01 DIAGNOSIS — L559 Sunburn, unspecified: Secondary | ICD-10-CM | POA: Insufficient documentation

## 2014-06-01 LAB — CBC
HEMATOCRIT: 36.3 % (ref 36.0–46.0)
Hemoglobin: 12.8 g/dL (ref 12.0–15.0)
MCH: 29.8 pg (ref 26.0–34.0)
MCHC: 35.3 g/dL (ref 30.0–36.0)
MCV: 84.4 fL (ref 78.0–100.0)
PLATELETS: 257 10*3/uL (ref 150–400)
RBC: 4.3 MIL/uL (ref 3.87–5.11)
RDW: 12.2 % (ref 11.5–15.5)
WBC: 8.6 10*3/uL (ref 4.0–10.5)

## 2014-06-01 LAB — COMPREHENSIVE METABOLIC PANEL
ALK PHOS: 70 U/L (ref 38–126)
ALT: 19 U/L (ref 14–54)
AST: 18 U/L (ref 15–41)
Albumin: 2.9 g/dL — ABNORMAL LOW (ref 3.5–5.0)
Anion gap: 9 (ref 5–15)
BILIRUBIN TOTAL: 0.5 mg/dL (ref 0.3–1.2)
BUN: 10 mg/dL (ref 6–20)
CO2: 27 mmol/L (ref 22–32)
Calcium: 8.6 mg/dL — ABNORMAL LOW (ref 8.9–10.3)
Chloride: 99 mmol/L — ABNORMAL LOW (ref 101–111)
Creatinine, Ser: 0.49 mg/dL (ref 0.44–1.00)
GFR calc non Af Amer: >60 mL/min (ref >60)
GLUCOSE: 494 mg/dL — AB (ref 65–99)
Potassium: 4.2 mmol/L (ref 3.5–5.1)
Sodium: 135 mmol/L (ref 135–145)
TOTAL PROTEIN: 6.4 g/dL — AB (ref 6.5–8.1)

## 2014-06-01 NOTE — ED Notes (Addendum)
Pt reported to Registration that she was leaving.

## 2014-06-01 NOTE — ED Notes (Addendum)
Pt c/o sunburn and headache since Friday and confusion since this morning. Pt sts "I am confused." When asked for more detail pt sts "it's like I can't read sentences." Per family, pt seems a little less focused than usual. Pt A&Ox4 and ambulatory. NAD noted. Pt has not trouble speaking in complete sentences or answering questions. Pt c/o headache since sunburn on Friday. Denies vision changes, chest pain, SOB.

## 2014-08-11 ENCOUNTER — Emergency Department (HOSPITAL_COMMUNITY)
Admission: EM | Admit: 2014-08-11 | Discharge: 2014-08-13 | Disposition: A | Discharge status: Home / Self Care | Payer: Self-pay | Attending: Emergency Medicine | Admitting: Emergency Medicine

## 2014-08-11 ENCOUNTER — Encounter (HOSPITAL_COMMUNITY): Payer: Self-pay | Admitting: Emergency Medicine

## 2014-08-11 DIAGNOSIS — F314 Bipolar disorder, current episode depressed, severe, without psychotic features: Secondary | ICD-10-CM | POA: Diagnosis present

## 2014-08-11 DIAGNOSIS — E119 Type 2 diabetes mellitus without complications: Secondary | ICD-10-CM | POA: Insufficient documentation

## 2014-08-11 DIAGNOSIS — Z794 Long term (current) use of insulin: Secondary | ICD-10-CM | POA: Insufficient documentation

## 2014-08-11 DIAGNOSIS — Z87891 Personal history of nicotine dependence: Secondary | ICD-10-CM | POA: Insufficient documentation

## 2014-08-11 DIAGNOSIS — Y9289 Other specified places as the place of occurrence of the external cause: Secondary | ICD-10-CM | POA: Insufficient documentation

## 2014-08-11 DIAGNOSIS — Y9389 Activity, other specified: Secondary | ICD-10-CM | POA: Insufficient documentation

## 2014-08-11 DIAGNOSIS — Z792 Long term (current) use of antibiotics: Secondary | ICD-10-CM | POA: Insufficient documentation

## 2014-08-11 DIAGNOSIS — Z79899 Other long term (current) drug therapy: Secondary | ICD-10-CM | POA: Insufficient documentation

## 2014-08-11 DIAGNOSIS — Y998 Other external cause status: Secondary | ICD-10-CM | POA: Insufficient documentation

## 2014-08-11 DIAGNOSIS — F419 Anxiety disorder, unspecified: Secondary | ICD-10-CM | POA: Insufficient documentation

## 2014-08-11 DIAGNOSIS — F329 Major depressive disorder, single episode, unspecified: Secondary | ICD-10-CM | POA: Insufficient documentation

## 2014-08-11 DIAGNOSIS — F1210 Cannabis abuse, uncomplicated: Secondary | ICD-10-CM | POA: Insufficient documentation

## 2014-08-11 DIAGNOSIS — T1491 Suicide attempt: Secondary | ICD-10-CM | POA: Insufficient documentation

## 2014-08-11 DIAGNOSIS — T56892A Toxic effect of other metals, intentional self-harm, initial encounter: Secondary | ICD-10-CM | POA: Insufficient documentation

## 2014-08-11 DIAGNOSIS — E780 Pure hypercholesterolemia: Secondary | ICD-10-CM | POA: Insufficient documentation

## 2014-08-11 DIAGNOSIS — T50902A Poisoning by unspecified drugs, medicaments and biological substances, intentional self-harm, initial encounter: Secondary | ICD-10-CM

## 2014-08-11 DIAGNOSIS — F4310 Post-traumatic stress disorder, unspecified: Secondary | ICD-10-CM | POA: Diagnosis present

## 2014-08-11 DIAGNOSIS — R110 Nausea: Secondary | ICD-10-CM | POA: Insufficient documentation

## 2014-08-11 DIAGNOSIS — G629 Polyneuropathy, unspecified: Secondary | ICD-10-CM | POA: Insufficient documentation

## 2014-08-11 DIAGNOSIS — I10 Essential (primary) hypertension: Secondary | ICD-10-CM | POA: Insufficient documentation

## 2014-08-11 LAB — RAPID URINE DRUG SCREEN, HOSP PERFORMED
Amphetamines: NOT DETECTED
BENZODIAZEPINES: NOT DETECTED
Barbiturates: NOT DETECTED
Cocaine: NOT DETECTED
Opiates: NOT DETECTED
Tetrahydrocannabinol: POSITIVE — AB

## 2014-08-11 LAB — SALICYLATE LEVEL

## 2014-08-11 LAB — COMPREHENSIVE METABOLIC PANEL WITH GFR
ALT: 18 U/L (ref 14–54)
AST: 18 U/L (ref 15–41)
Albumin: 3.6 g/dL (ref 3.5–5.0)
Alkaline Phosphatase: 64 U/L (ref 38–126)
Anion gap: 11 (ref 5–15)
BUN: 8 mg/dL (ref 6–20)
CO2: 26 mmol/L (ref 22–32)
Calcium: 9.2 mg/dL (ref 8.9–10.3)
Chloride: 98 mmol/L — ABNORMAL LOW (ref 101–111)
Creatinine, Ser: 0.59 mg/dL (ref 0.44–1.00)
GFR calc Af Amer: >60 mL/min
GFR calc non Af Amer: >60 mL/min
Glucose, Bld: 326 mg/dL — ABNORMAL HIGH (ref 65–99)
Potassium: 3.7 mmol/L (ref 3.5–5.1)
Sodium: 135 mmol/L (ref 135–145)
Total Bilirubin: 0.7 mg/dL (ref 0.3–1.2)
Total Protein: 8.1 g/dL (ref 6.5–8.1)

## 2014-08-11 LAB — I-STAT BETA HCG BLOOD, ED (MC, WL, AP ONLY): I-stat hCG, quantitative: <5 m[IU]/mL

## 2014-08-11 LAB — CBC
HCT: 41.9 % (ref 36.0–46.0)
Hemoglobin: 15.2 g/dL — ABNORMAL HIGH (ref 12.0–15.0)
MCH: 30.4 pg (ref 26.0–34.0)
MCHC: 36.3 g/dL — ABNORMAL HIGH (ref 30.0–36.0)
MCV: 83.8 fL (ref 78.0–100.0)
Platelets: 299 K/uL (ref 150–400)
RBC: 5 MIL/uL (ref 3.87–5.11)
RDW: 12.4 % (ref 11.5–15.5)
WBC: 10.3 K/uL (ref 4.0–10.5)

## 2014-08-11 LAB — LITHIUM LEVEL
LITHIUM LVL: 1.29 mmol/L — AB (ref 0.60–1.20)
Lithium Lvl: 1.41 mmol/L — ABNORMAL HIGH (ref 0.60–1.20)

## 2014-08-11 LAB — ETHANOL: Alcohol, Ethyl (B): <5 mg/dL (ref <5)

## 2014-08-11 LAB — ACETAMINOPHEN LEVEL: Acetaminophen (Tylenol), Serum: <10 ug/mL — ABNORMAL LOW (ref 10–30)

## 2014-08-11 MED ORDER — SODIUM CHLORIDE 0.9 % IV BOLUS (SEPSIS)
1000.0000 mL | Freq: Once | INTRAVENOUS | Status: AC
Start: 2014-08-11 — End: 2014-08-11
  Administered 2014-08-11: 1000 mL via INTRAVENOUS

## 2014-08-11 NOTE — ED Provider Notes (Signed)
CSN: [id number]     Arrival date & time 08/11/14  1804 History   First MD Initiated Contact with Patient 08/11/14 1837     Chief Complaint  Patient presents with  . Drug Overdose  . Suicide Attempt     (Consider location/radiation/quality/duration/timing/severity/associated sxs/prior Treatment) HPI   44 year old female history diabetes, hypertension, anxiety, depression presents to the emergency department today after a overdose of lithium. Apparently took 15 - 300 mg of lithium at approximately 1730 today. Has had nausea now but no neurologic changes. This was a suicide attempt, has a history of the same.  Past Medical History  Diagnosis Date  . Diabetes mellitus without complication   . Hypertension   . High cholesterol   . Neuropathy   . PCOS (polycystic ovarian syndrome)   . Anxiety   . Depression    Past Surgical History  Procedure Laterality Date  . Elbow surgery     Family History  Problem Relation Age of Onset  . Depression Mother   . Anxiety disorder Mother    Social History  Substance Use Topics  . Smoking status: Former Smoker    Types: Cigarettes    Quit date: 09/12/2000  . Smokeless tobacco: Never Used  . Alcohol Use: No     Comment: occasional   OB History    No data available     Review of Systems  Constitutional: Negative for fever and activity change.  HENT: Negative for congestion and facial swelling.   Eyes: Negative for discharge and redness.  Respiratory: Negative for cough and shortness of breath.   Cardiovascular: Negative for chest pain and palpitations.  Gastrointestinal: Positive for nausea. Negative for abdominal pain and abdominal distention.  Endocrine: Negative for polydipsia and polyuria.  Genitourinary: Negative for dysuria and menstrual problem.  Musculoskeletal: Negative for back pain and joint swelling.  Skin: Negative for color change and wound.  Neurological: Negative for dizziness, light-headedness and headaches.       Allergies  Review of patient's allergies indicates no known allergies.  Home Medications   Prior to Admission medications   Medication Sig Start Date End Date Taking? Authorizing Provider  acetaminophen (TYLENOL) 650 MG CR tablet Take 650 mg by mouth every 8 (eight) hours as needed for pain.   Yes Historical Provider, MD  amoxicillin (AMOXIL) 500 MG capsule Take 500 mg by mouth 3 (three) times daily.   Yes Historical Provider, MD  atorvastatin (LIPITOR) 40 MG tablet Take 40 mg by mouth daily.   Yes Historical Provider, MD  chlorpheniramine-HYDROcodone (TUSSIONEX PENNKINETIC ER) 10-8 MG/5ML LQCR Take 5 mLs by mouth every 12 (twelve) hours as needed for cough. 02/26/14  Yes Fayrene Helper, PA-C  Docusate Calcium (STOOL SOFTENER PO) Take 1 tablet by mouth at bedtime.   Yes Historical Provider, MD  famotidine (PEPCID) 40 MG tablet Take 40 mg by mouth daily.    Yes Historical Provider, MD  fluvoxaMINE (LUVOX) 100 MG tablet Take 100 mg by mouth at bedtime.   Yes Historical Provider, MD  gabapentin (NEURONTIN) 600 MG tablet Take 600-1,200 mg by mouth 2 (two) times daily. Take 1 tablet (600 mg) in the am and Take 2  (1200 mg) tablets at bedtime.   Yes Historical Provider, MD  hydrochlorothiazide (HYDRODIURIL) 25 MG tablet Take 25 mg by mouth at bedtime.    Yes Historical Provider, MD  ibuprofen (ADVIL,MOTRIN) 200 MG tablet Take 200 mg by mouth every 6 (six) hours as needed for moderate pain.   Yes Historical  Provider, MD  lisinopril (PRINIVIL,ZESTRIL) 10 MG tablet Take 10 mg by mouth daily.   Yes Historical Provider, MD  lithium carbonate 300 MG capsule Take 300 mg by mouth 2 (two) times daily with a meal.   Yes Historical Provider, MD  prazosin (MINIPRESS) 2 MG capsule Take 2 mg by mouth at bedtime.   Yes Historical Provider, MD  azithromycin (ZITHROMAX Z-PAK) 250 MG tablet Take 1 tablet (250 mg total) by mouth daily.  PO day 1, then  PO days 205 Patient not taking: Reported on 08/11/2014  03/04/14   Roxy Horseman, PA-C  guaiFENesin (ROBITUSSIN) 100 MG/5ML liquid Take 5-10 mLs (100-200 mg total) by mouth every 4 (four) hours as needed for congestion. Patient not taking: Reported on 08/11/2014 02/26/14   Fayrene Helper, PA-C  insulin aspart (NOVOLOG) 100 UNIT/ML injection Inject 0-20 Units into the skin 3 (three) times daily with meals. Patient not taking: Reported on 08/11/2014 09/16/13   Maryruth Bun Rama, MD  insulin aspart (NOVOLOG) 100 UNIT/ML injection Inject 6 Units into the skin 3 (three) times daily with meals. Patient not taking: Reported on 11/28/2013 09/16/13   Maryruth Bun Rama, MD  insulin glargine (LANTUS) 100 UNIT/ML injection Inject 0.27 mLs (27 Units total) into the skin at bedtime. Patient not taking: Reported on 08/11/2014 09/16/13   Maryruth Bun Rama, MD   BP 130/79 mmHg  Pulse 88  Temp(Src) 98.4 F (36.9 C) (Oral)  Resp 16  SpO2 99%  LMP 08/11/2014 Physical Exam  Constitutional: She is oriented to person, place, and time. She appears well-developed and well-nourished.  HENT:  Head: Normocephalic and atraumatic.  Eyes: Conjunctivae and EOM are normal. Right eye exhibits no discharge. Left eye exhibits no discharge.  Cardiovascular: Normal rate and regular rhythm.   Pulmonary/Chest: Effort normal and breath sounds normal. No respiratory distress.  Abdominal: Soft. She exhibits no distension. There is no tenderness. There is no rebound.  Musculoskeletal: Normal range of motion. She exhibits no edema or tenderness.  Neurological: She is alert and oriented to person, place, and time.  No altered mental status, able to give full seemingly accurate history.  Face is symmetric, EOM's intact, pupils equal and reactive, vision intact, tongue and uvula midline without deviation Upper and Lower extremity motor 5/5, intact pain perception in distal extremities, 2+ reflexes in biceps, patella and achilles tendons.  Walks without assistance or evident ataxia.   Skin: Skin is warm  and dry.  Nursing note and vitals reviewed.   ED Course  Procedures (including critical care time) Labs Review Labs Reviewed  COMPREHENSIVE METABOLIC PANEL - Abnormal; Notable for the following:    Chloride 98 (*)    Glucose, Bld 326 (*)    All other components within normal limits  ACETAMINOPHEN LEVEL - Abnormal; Notable for the following:    Acetaminophen (Tylenol), Serum <10 (*)    All other components within normal limits  CBC - Abnormal; Notable for the following:    Hemoglobin 15.2 (*)    MCHC 36.3 (*)    All other components within normal limits  URINE RAPID DRUG SCREEN, HOSP PERFORMED - Abnormal; Notable for the following:    Tetrahydrocannabinol POSITIVE (*)    All other components within normal limits  LITHIUM LEVEL - Abnormal; Notable for the following:    Lithium Lvl 1.41 (*)    All other components within normal limits  LITHIUM LEVEL - Abnormal; Notable for the following:    Lithium Lvl 1.29 (*)    All other  components within normal limits  CBG MONITORING, ED - Abnormal; Notable for the following:    Glucose-Capillary 312 (*)    All other components within normal limits  CBG MONITORING, ED - Abnormal; Notable for the following:    Glucose-Capillary 296 (*)    All other components within normal limits  ETHANOL  SALICYLATE LEVEL  LITHIUM LEVEL  I-STAT BETA HCG BLOOD, ED ([facility], [facility], [facility] ONLY)    Imaging Review No results found.   EKG Interpretation   Date/Time:  Tuesday August 11 2014 19:08:39 EDT Ventricular Rate:  81 PR Interval:  175 QRS Duration: 70 QT Interval:  402 QTC Calculation: 467 R Axis:   35 Text Interpretation:  Sinus rhythm Low voltage, precordial leads Probable  anteroseptal infarct, old nonspecific t wave changes in III similar to  prior Confirmed by [facility] MD, Barbara Cower [phone]) on 08/11/2014 11:48:54 PM      MDM   Final diagnoses:  Suicide attempt by drug ingestion, initial encounter    44 year old female with a lithium overdose.  Poison control has been contacted and were questioning multiple lithium checks. First was 1.41 no other coingestions based on labs. No neurologic changes no reason for admission this time recheck was 1.29 so it is coming down. They recommend 1 more check of 4 hours and if this is still coming down from previous the patient can be medically cleared and evaluated by psychiatry for suicidal attempt.    Marily Memos, MD 08/12/14 [phone]

## 2014-08-11 NOTE — ED Notes (Signed)
Staffing made aware of need for sitter 

## 2014-08-11 NOTE — ED Notes (Signed)
Pt ingested "about 15 lithium carb 300 mg" at 1730. Currently, pt experiencing nausea, denies vomiting or cramping. Denies pain. Pt had thoughts of hurting herself earlier today, but not currently. Denies hallucinations and HI. Pt has struggled with depression before, has attempted suicide before.

## 2014-08-11 NOTE — ED Notes (Signed)
Belongings sent home w/ friend per pt request.

## 2014-08-12 DIAGNOSIS — T56892A Toxic effect of other metals, intentional self-harm, initial encounter: Secondary | ICD-10-CM

## 2014-08-12 DIAGNOSIS — F314 Bipolar disorder, current episode depressed, severe, without psychotic features: Secondary | ICD-10-CM

## 2014-08-12 DIAGNOSIS — T1491 Suicide attempt: Secondary | ICD-10-CM

## 2014-08-12 LAB — CBG MONITORING, ED
GLUCOSE-CAPILLARY: 258 mg/dL — AB (ref 65–99)
GLUCOSE-CAPILLARY: 188 mg/dL — AB (ref 65–99)
Glucose-Capillary: 312 mg/dL — ABNORMAL HIGH (ref 65–99)
Glucose-Capillary: 296 mg/dL — ABNORMAL HIGH (ref 65–99)

## 2014-08-12 LAB — LITHIUM LEVEL: Lithium Lvl: 0.83 mmol/L (ref 0.60–1.20)

## 2014-08-12 MED ORDER — IBUPROFEN 200 MG PO TABS
400.0000 mg | ORAL_TABLET | Freq: Once | ORAL | Status: AC
  Administered 2014-08-12: 400 mg via ORAL
  Filled 2014-08-12: qty 2

## 2014-08-12 MED ORDER — LITHIUM CARBONATE 300 MG PO CAPS
300.0000 mg | ORAL_CAPSULE | Freq: Two times a day (BID) | ORAL | Status: DC
  Administered 2014-08-12: 300 mg via ORAL
  Filled 2014-08-12: qty 1

## 2014-08-12 MED ORDER — LISINOPRIL 10 MG PO TABS
10.0000 mg | ORAL_TABLET | Freq: Every day | ORAL | Status: DC
  Administered 2014-08-12 – 2014-08-13 (×2): 10 mg via ORAL
  Filled 2014-08-12 (×2): qty 1

## 2014-08-12 MED ORDER — GABAPENTIN 400 MG PO CAPS
1200.0000 mg | ORAL_CAPSULE | Freq: Every evening | ORAL | Status: DC
  Administered 2014-08-12: 1200 mg via ORAL
  Filled 2014-08-12: qty 3

## 2014-08-12 MED ORDER — INSULIN ASPART 100 UNIT/ML ~~LOC~~ SOLN
0.0000 [IU] | Freq: Three times a day (TID) | SUBCUTANEOUS | Status: DC
  Administered 2014-08-12 (×2): 8 [IU] via SUBCUTANEOUS
  Administered 2014-08-13: 09:00:00 via SUBCUTANEOUS
  Filled 2014-08-12: qty 1

## 2014-08-12 MED ORDER — GABAPENTIN 300 MG PO CAPS
600.0000 mg | ORAL_CAPSULE | Freq: Every morning | ORAL | Status: DC
  Administered 2014-08-12 – 2014-08-13 (×2): 600 mg via ORAL
  Filled 2014-08-12 (×2): qty 2

## 2014-08-12 MED ORDER — FAMOTIDINE 20 MG PO TABS
40.0000 mg | ORAL_TABLET | Freq: Every day | ORAL | Status: DC
  Administered 2014-08-12 – 2014-08-13 (×2): 40 mg via ORAL
  Filled 2014-08-12 (×2): qty 2

## 2014-08-12 MED ORDER — HYDROCHLOROTHIAZIDE 25 MG PO TABS
25.0000 mg | ORAL_TABLET | Freq: Every day | ORAL | Status: DC
  Administered 2014-08-12: 25 mg via ORAL
  Filled 2014-08-12 (×2): qty 1

## 2014-08-12 MED ORDER — INSULIN ASPART 100 UNIT/ML ~~LOC~~ SOLN: 0.0000 [IU] | Freq: Every day | SUBCUTANEOUS | Status: DC

## 2014-08-12 MED ORDER — ATORVASTATIN CALCIUM 40 MG PO TABS
40.0000 mg | ORAL_TABLET | Freq: Every day | ORAL | Status: DC
  Administered 2014-08-12: 40 mg via ORAL
  Filled 2014-08-12 (×2): qty 1

## 2014-08-12 MED ORDER — INSULIN ASPART 100 UNIT/ML ~~LOC~~ SOLN
4.0000 [IU] | Freq: Three times a day (TID) | SUBCUTANEOUS | Status: DC
  Administered 2014-08-12 – 2014-08-13 (×3): 4 [IU] via SUBCUTANEOUS
  Filled 2014-08-12: qty 1

## 2014-08-12 MED ORDER — PRAZOSIN HCL 2 MG PO CAPS
2.0000 mg | ORAL_CAPSULE | Freq: Every day | ORAL | Status: DC
  Administered 2014-08-12: 2 mg via ORAL
  Filled 2014-08-12 (×2): qty 1

## 2014-08-12 MED ORDER — DOCUSATE SODIUM 100 MG PO CAPS
100.0000 mg | ORAL_CAPSULE | Freq: Every day | ORAL | Status: DC
  Administered 2014-08-12: 100 mg via ORAL
  Filled 2014-08-12 (×2): qty 1

## 2014-08-12 NOTE — ED Notes (Signed)
Patient is pleasant on approach. Reports some SI prior to coming to hospital but denies at present. Reports just got back on lithium a few weeks ago. Reports neuropathy from her diabetes which is not well controlled. States that she hasn't been taking any medications or insulin for diabetes recently even though she should. Patient aware of the risks of noncompliance. Patient to start on sliding scale at noon today. Cooperative with medications
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
## 2014-08-12 NOTE — BH Assessment (Signed)
Reviewed ED notes prior to initiating assessment. Pt ingested 15 lithium pills in suicide attempt.   Assessment to commence shortly.   Clista Bernhardt, [facility] Triage Specialist 08/12/2014 2:39 AM

## 2014-08-12 NOTE — ED Notes (Signed)
Patient denies SI, HI and AVH at this time. Patient calm and cooperative at this time. Plan of care discussed with patient. Patient voices no complaints or concerns at this time. Encouragement and support provided and safety maintain. Q 15 min safety checks remain in place.  

## 2014-08-12 NOTE — ED Notes (Signed)
David from poison control stated patient was cleared and would close case.

## 2014-08-12 NOTE — ED Notes (Signed)
Pt ambulatory w/o assistance to bathroom. Counselor now at bedside.

## 2014-08-12 NOTE — BH Assessment (Signed)
Pt requested to speak with TTS. She expressed frustration over being in the hospital, not being allowed to contact husband prior to moving back to [place], and fears that she will lose her job, and that she would not be able to see husband until Saturday and this could ruin her marriage. Pt minimized overdose. Offered support and encouragement and informed her she could call work to let them know she would not be in. Informed Rashell RN of pt concerns.   Clista Bernhardt, [facility] Triage Specialist 08/12/2014 5:36 AM

## 2014-08-12 NOTE — ED Notes (Signed)
Patient denies SI, HI and AVH at this time. Patient appears irritable but cooperative at this time. Plan of care discussed with patient. Patient voices no concerns or complaints at this time. Encouragement and support provided and safety maintain. Q 15 min safety check remain in place.

## 2014-08-12 NOTE — ED Notes (Signed)
Pt provided with [place] sandwich and water. Calm, cooperative, alert and oriented.

## 2014-08-12 NOTE — ED Notes (Signed)
Report to Rashell RN in SAPPU-ready for patient

## 2014-08-13 LAB — CBG MONITORING, ED: GLUCOSE-CAPILLARY: 295 mg/dL — AB (ref 65–99)

## 2014-08-13 LAB — LITHIUM LEVEL: Lithium Lvl: 0.42 mmol/L — ABNORMAL LOW (ref 0.60–1.20)
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
## 2014-08-13 NOTE — Progress Notes (Signed)
Inpatient Diabetes Program Recommendations  AACE/ADA: New Consensus Statement on Inpatient Glycemic Control (2013)  Target Ranges:  Prepandial:   less than 140 mg/dL      Peak postprandial:   less than 180 mg/dL (1-2 hours)      Critically ill patients:  140 - 180 mg/dL   Results for DARCHELLE, NUNES (MRN [id number]) as of 08/13/2014 09:43  Ref. Range 08/12/2014 07:51 08/12/2014 12:44 08/12/2014 17:11 08/12/2014 21:43  Glucose-Capillary Latest Ref Range: 65-99 mg/dL 409 (H) 811 (H) 914 (H) 188 (H)    Results for RASHI, GRANIER (MRN [id number]) as of 08/13/2014 09:43  Ref. Range 08/13/2014 08:10  Glucose-Capillary Latest Ref Range: 65-99 mg/dL 086 (H)    Admit w/ Intentional OD of Lithium.   History of: DM, HTN, Anxiety, Depression  Home DM Meds: Lantus 27 units QHS (Not taking)        Novolog (Not taking)  Current DM Meds: Novolog Moderate SSI (0-15 units) TID AC + HS         Novolog 4 units tidwc     MD- Through Chart Review, note patient was prescribed Lantus 27 units QHS at time of discharge by Dr. Hillery Aldo ([facility]) back in September of 2015.  Note patient admits to noncompliance with this insulin.  Fasting glucose 295 mg/dl this AM.  Please consider starting Lantus 18 units daily (0.2 units/kg dosing) while hospitalized (more conservative dose to start since patient has not been taking)     Will follow Ambrose Finland RN, MSN, CDE Diabetes Coordinator Inpatient Glycemic Control Team Team Pager: [phone] (8a-5p)

## 2014-08-13 NOTE — Discharge Instructions (Addendum)
For your ongoing mental health needs, you are advised to follow up with [facility].  If you do not currently have an appointment scheduled, new and returning patients are seen at their walk-in clinic.  Walk-in hours are Monday - Friday from 8:00 am - 3:00 pm.  Walk-in patients are seen on a first come, first served basis.  Try to arrive as early as possible for he best chance of being seen the same day:       [facility]      [address]      [place], [place] [address]      [phone]  You have indicated that you are interested in received treatment services from [facility].  Call them to see about scheduling an appointment with them:       [facility]      [address]      [place], [place] [address]      [phone]

## 2014-08-13 NOTE — BH Assessment (Signed)
BHH Assessment Progress Note  Per Thedore Mins, MD, this pt does not require psychiatric hospitalization at this time.  She is to be discharged from [facility] with outpatient referrals.  Pt has been a client of [facility], but would like to change to the service of [facility] once she has been added to her husband's health insurance policy.  Discharge instructions include contact information for both of these providers.  Pt's nurse, Dawnaly, has been notified.  Doylene Canning, MA Triage Specialist [phone]

## 2014-08-13 NOTE — ED Notes (Signed)
Patient discharged to home.  No belongings here.  Denies thoughts of harm to self or others.  Left the unit ambulatory.  Escorted to the front lobby to meet her husband.

## 2014-08-13 NOTE — Consult Note (Signed)
Psychiatric Specialty Exam: Physical Exam  ROS  Blood pressure 97/63, pulse 89, temperature 98 F (36.7 C), temperature source Oral, resp. rate 17, last menstrual period 08/11/2014, SpO2 99 %.There is no weight on file to calculate BMI.  General Appearance: Casual and Fairly Groomed  Patent attorney:: Good  Speech: Clear and Coherent and Normal Rate  Volume: Normal  Mood: Anxious  Affect: Congruent  Thought Process: Coherent, Goal Directed and Intact  Orientation: Full (Time, Place, and Person)  Thought Content: WDL  Suicidal Thoughts: No  Homicidal Thoughts: No  Memory: Immediate; Good Recent; Good Remote; Good  Judgement: Poor  Insight: Shallow  Psychomotor Activity: Normal  Concentration: Good  Recall: NA  Fund of Knowledge:Good  Language: Good  Akathisia: NA  Handed: Right  AIMS (if indicated):    Assets: Desire for Improvement  ADL's: Intact  Cognition: WNL  Sleep:           Patient was seen this morning alert and oriented x3.  Patient vehemently denies SI/HI/AVH and she stated that she love her husband and her cats.  Patient agrees that she need counseling to help her with coping and stress management.  Her Lithium level today came back normal.  Patient can now resume her Lithium.  Patient is now discharged home.  Bipolar affective disorder, depressed, severe   Plan:  Discharge home, follow up with your PMD/[facility] for counseling.  Dahlia Byes   PMHNP-BC  Patient seen face-to-face for psychiatric evaluation, chart reviewed and case discussed with the physician extender and developed treatment plan. Reviewed the information documented and agree with the treatment plan. Thedore Mins, MD

## 2014-09-21 ENCOUNTER — Other Ambulatory Visit: Payer: Self-pay | Admitting: Psychiatry

## 2014-10-12 ENCOUNTER — Encounter (INDEPENDENT_AMBULATORY_CARE_PROVIDER_SITE_OTHER): Payer: Self-pay | Admitting: Ophthalmology

## 2014-10-19 ENCOUNTER — Other Ambulatory Visit: Payer: Self-pay | Admitting: Obstetrics and Gynecology

## 2014-10-19 DIAGNOSIS — R928 Other abnormal and inconclusive findings on diagnostic imaging of breast: Secondary | ICD-10-CM

## 2014-10-23 ENCOUNTER — Ambulatory Visit
Admission: RE | Admit: 2014-10-23 | Discharge: 2014-10-23 | Disposition: A | Discharge status: Home / Self Care | Payer: BLUE CROSS/BLUE SHIELD | Source: Ambulatory Visit | Attending: Obstetrics and Gynecology | Admitting: Obstetrics and Gynecology

## 2014-10-23 ENCOUNTER — Encounter (INDEPENDENT_AMBULATORY_CARE_PROVIDER_SITE_OTHER): Payer: Self-pay | Admitting: Ophthalmology

## 2014-10-23 DIAGNOSIS — R928 Other abnormal and inconclusive findings on diagnostic imaging of breast: Secondary | ICD-10-CM

## 2014-11-08 IMAGING — CR DG SHOULDER 2+V*L*
4 series · 4 of 4 positions shown · non-contrast
Comparison: None.

CLINICAL DATA: Fall.  Shoulder injury

EXAM:
LEFT SHOULDER - 2+ VIEW

[w shoulder internal left]
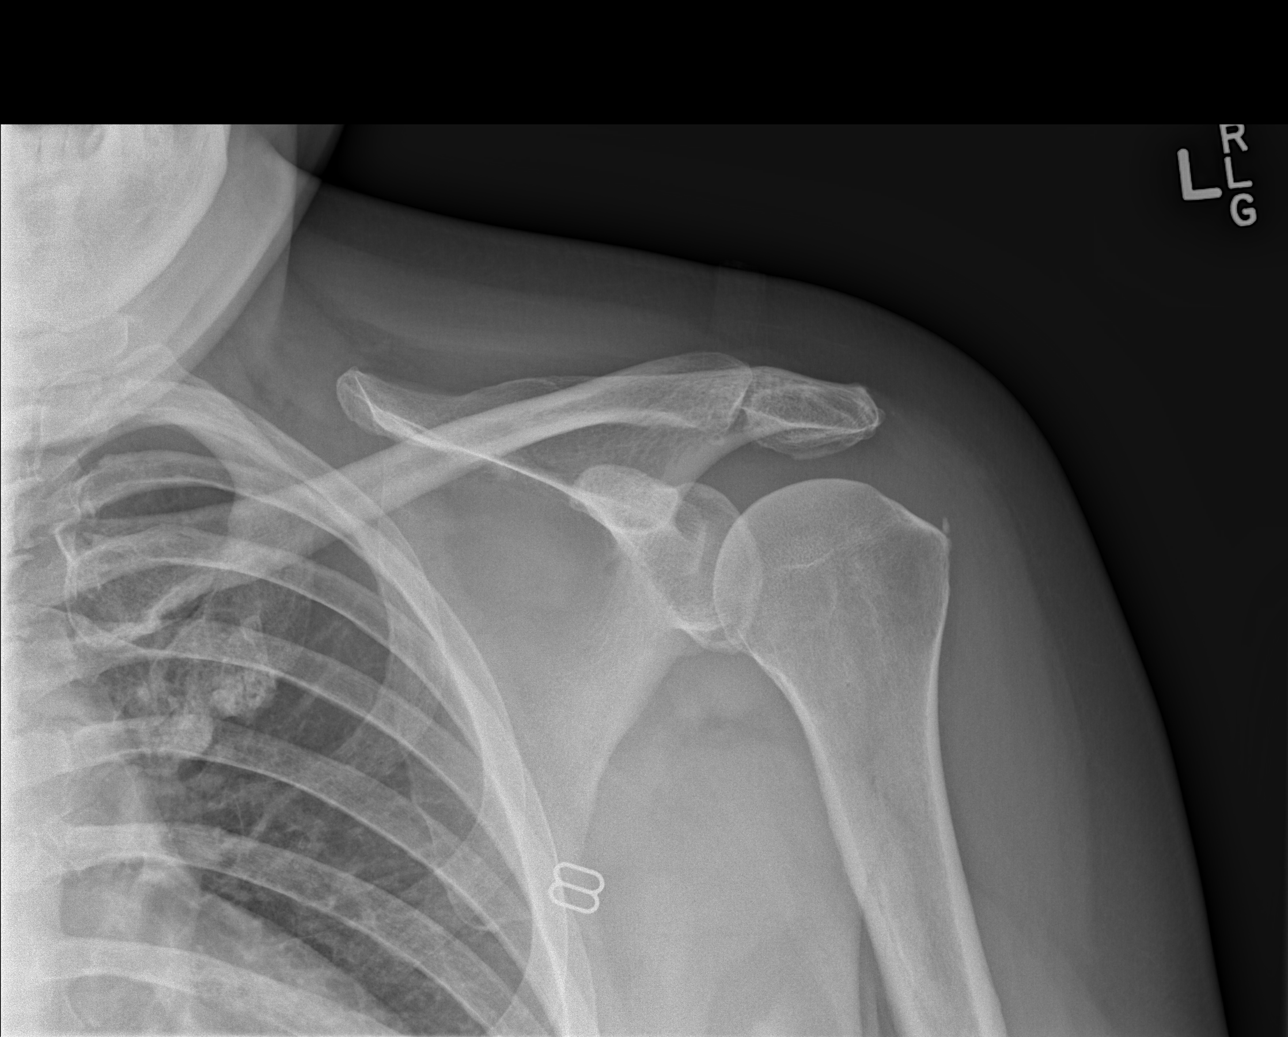

[w shoulder y-view left (1 of 2)]
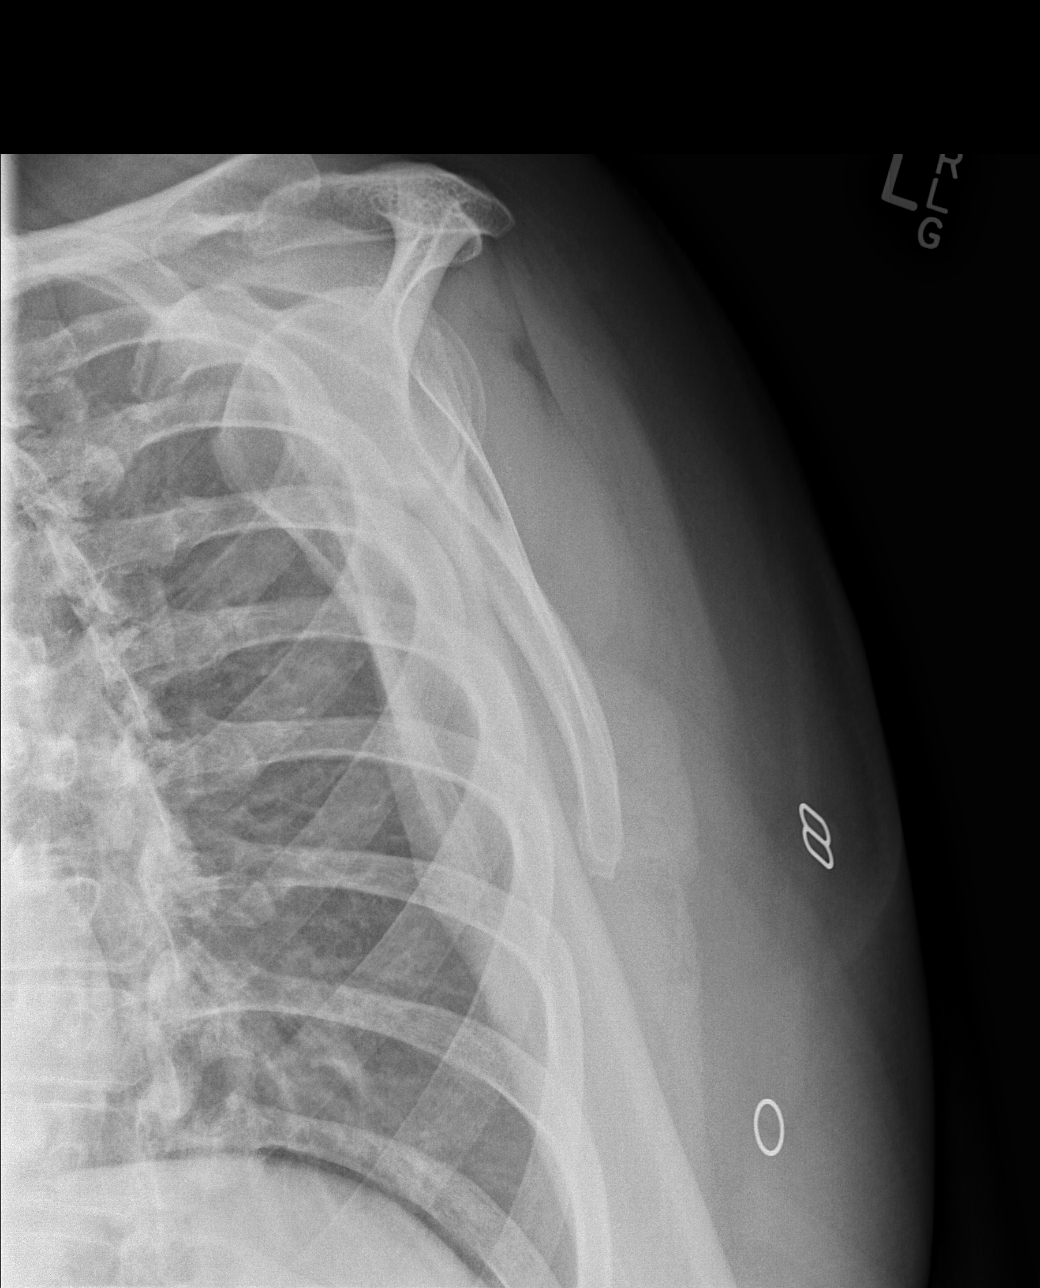

[w shoulder y-view left (2 of 2)]
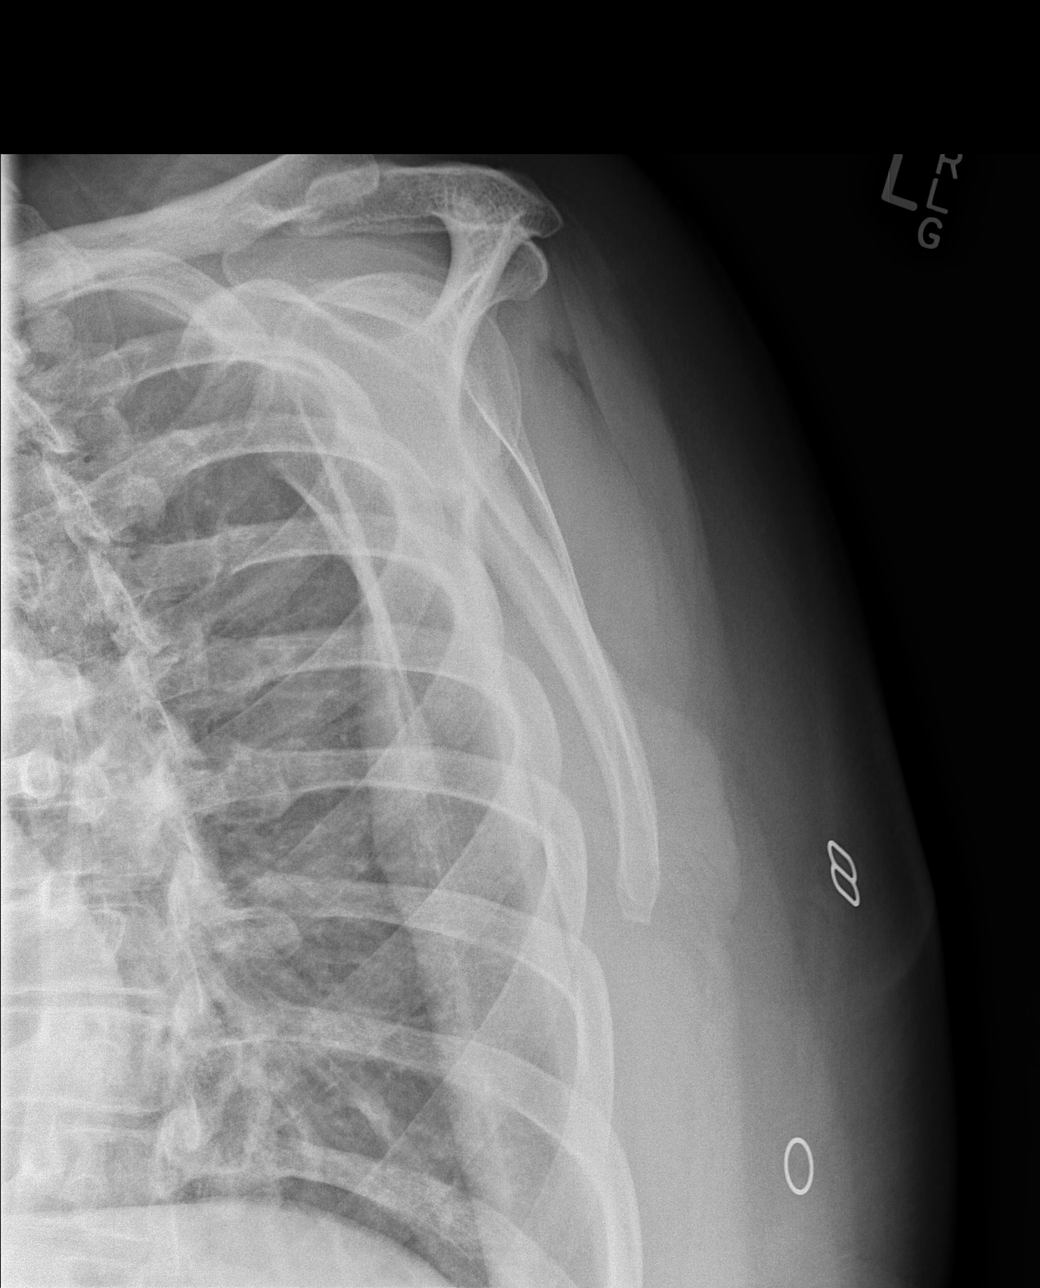

[x shoulder axillary left]
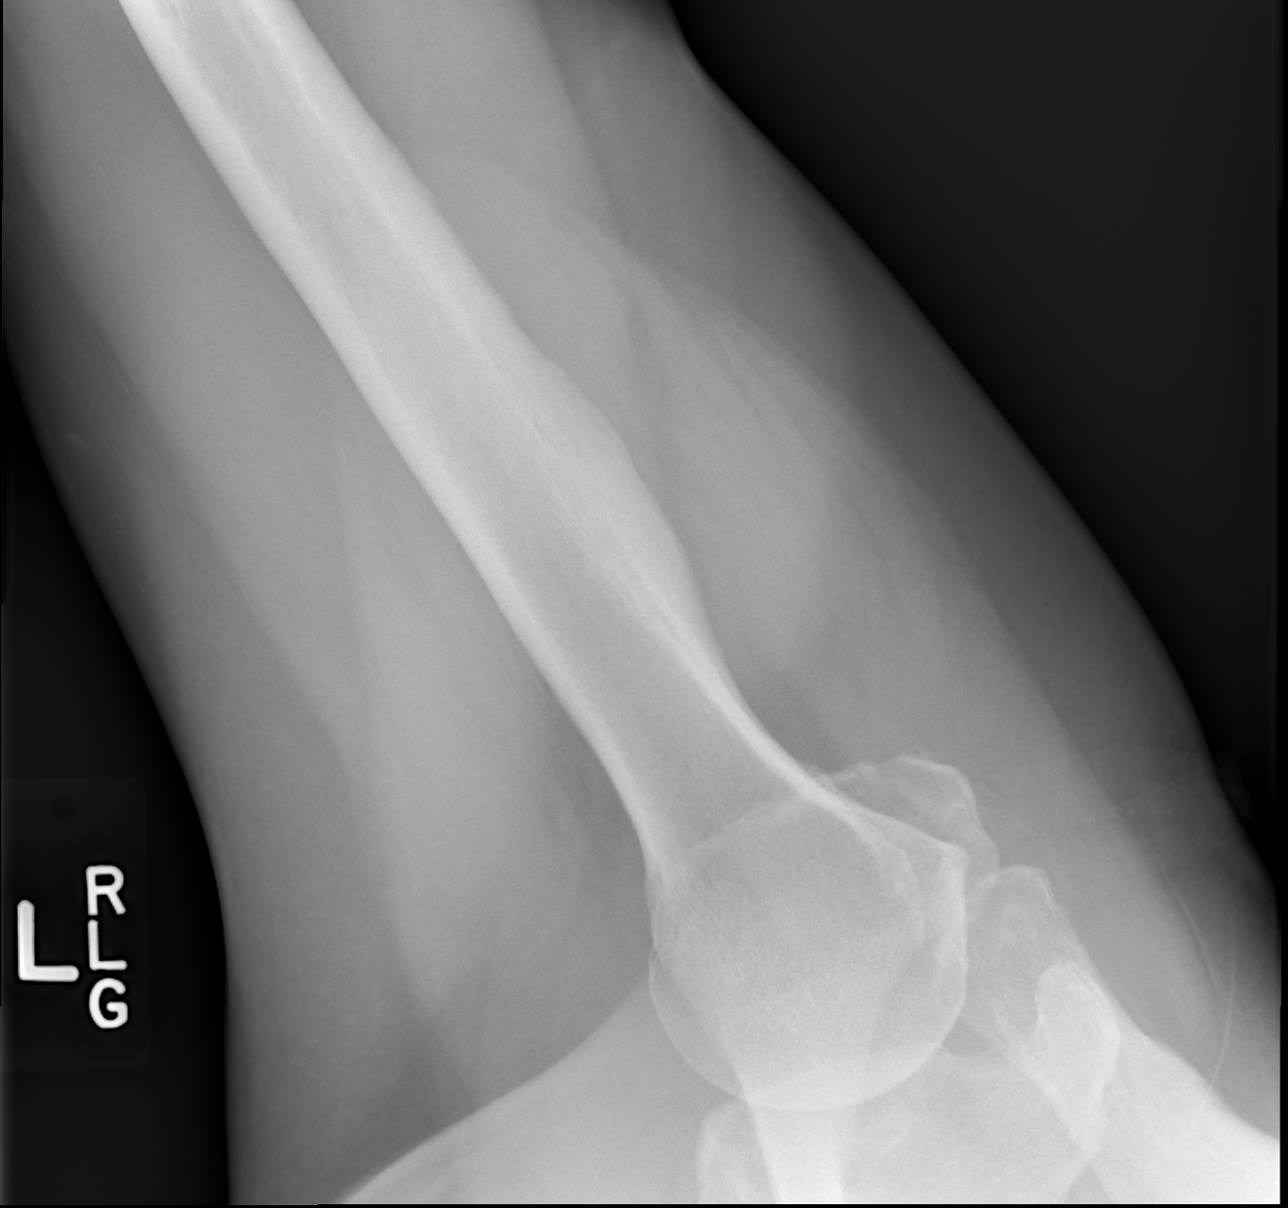

[4 of 4 positions shown; findings below may reference images not displayed]

FINDINGS: Mild degenerative change in spurring involving the AC joint.
Glenohumeral joint normal

Normal alignment.

Small calcification above the greater tuberosity appears represent
calcific tendinitis. No definite fracture.
IMPRESSION: AC degenerative change.

Calcific tendinitis in the rotator cuff

Negative for fracture.

## 2014-12-03 ENCOUNTER — Encounter (INDEPENDENT_AMBULATORY_CARE_PROVIDER_SITE_OTHER): Payer: BLUE CROSS/BLUE SHIELD | Admitting: Ophthalmology

## 2014-12-17 ENCOUNTER — Encounter (INDEPENDENT_AMBULATORY_CARE_PROVIDER_SITE_OTHER): Payer: BLUE CROSS/BLUE SHIELD | Admitting: Ophthalmology

## 2014-12-17 DIAGNOSIS — E10311 Type 1 diabetes mellitus with unspecified diabetic retinopathy with macular edema: Secondary | ICD-10-CM

## 2014-12-17 DIAGNOSIS — H43813 Vitreous degeneration, bilateral: Secondary | ICD-10-CM

## 2014-12-17 DIAGNOSIS — H35033 Hypertensive retinopathy, bilateral: Secondary | ICD-10-CM | POA: Diagnosis not present

## 2014-12-17 DIAGNOSIS — I10 Essential (primary) hypertension: Secondary | ICD-10-CM | POA: Diagnosis not present

## 2014-12-17 DIAGNOSIS — E103513 Type 1 diabetes mellitus with proliferative diabetic retinopathy with macular edema, bilateral: Secondary | ICD-10-CM | POA: Diagnosis not present

## 2015-02-01 ENCOUNTER — Encounter (INDEPENDENT_AMBULATORY_CARE_PROVIDER_SITE_OTHER): Payer: BLUE CROSS/BLUE SHIELD | Admitting: Ophthalmology

## 2015-03-10 ENCOUNTER — Encounter (INDEPENDENT_AMBULATORY_CARE_PROVIDER_SITE_OTHER): Payer: PRIVATE HEALTH INSURANCE | Admitting: Ophthalmology

## 2015-03-19 ENCOUNTER — Encounter (INDEPENDENT_AMBULATORY_CARE_PROVIDER_SITE_OTHER): Payer: BLUE CROSS/BLUE SHIELD | Admitting: Ophthalmology

## 2015-04-26 ENCOUNTER — Encounter (HOSPITAL_COMMUNITY): Payer: Self-pay | Admitting: Emergency Medicine

## 2015-04-26 ENCOUNTER — Emergency Department (HOSPITAL_COMMUNITY)
Admission: EM | Admit: 2015-04-26 | Discharge: 2015-04-26 | Disposition: A | Discharge status: Home / Self Care | Payer: Managed Care, Other (non HMO) | Attending: Emergency Medicine | Admitting: Emergency Medicine

## 2015-04-26 DIAGNOSIS — I10 Essential (primary) hypertension: Secondary | ICD-10-CM | POA: Insufficient documentation

## 2015-04-26 DIAGNOSIS — R109 Unspecified abdominal pain: Secondary | ICD-10-CM | POA: Insufficient documentation

## 2015-04-26 DIAGNOSIS — R110 Nausea: Secondary | ICD-10-CM | POA: Insufficient documentation

## 2015-04-26 DIAGNOSIS — E119 Type 2 diabetes mellitus without complications: Secondary | ICD-10-CM | POA: Diagnosis not present

## 2015-04-26 LAB — URINALYSIS, ROUTINE W REFLEX MICROSCOPIC
Bilirubin Urine: NEGATIVE
Ketones, ur: 40 mg/dL — AB
LEUKOCYTES UA: NEGATIVE
NITRITE: NEGATIVE
PH: 5 (ref 5.0–8.0)
Protein, ur: NEGATIVE mg/dL
SPECIFIC GRAVITY, URINE: 1.035 — AB (ref 1.005–1.030)

## 2015-04-26 LAB — URINE MICROSCOPIC-ADD ON

## 2015-04-26 NOTE — ED Notes (Addendum)
Pt c/o R flank pain since Saturday. Pt states pain is worse when trying to urinate. Pt c/o nausea, no vomiting. Pt states her urine is dark and foul smelling.

## 2015-04-26 NOTE — ED Notes (Signed)
Pt stated that she couldn't wait any longer and was going home

## 2015-06-14 ENCOUNTER — Emergency Department (HOSPITAL_COMMUNITY)
Admission: EM | Admit: 2015-06-14 | Discharge: 2015-06-15 | Disposition: A | Discharge status: Home / Self Care | Payer: Managed Care, Other (non HMO) | Attending: Emergency Medicine | Admitting: Emergency Medicine

## 2015-06-14 ENCOUNTER — Encounter (HOSPITAL_COMMUNITY): Payer: Self-pay | Admitting: Emergency Medicine

## 2015-06-14 DIAGNOSIS — F329 Major depressive disorder, single episode, unspecified: Secondary | ICD-10-CM | POA: Insufficient documentation

## 2015-06-14 DIAGNOSIS — Z794 Long term (current) use of insulin: Secondary | ICD-10-CM | POA: Diagnosis not present

## 2015-06-14 DIAGNOSIS — I10 Essential (primary) hypertension: Secondary | ICD-10-CM | POA: Diagnosis not present

## 2015-06-14 DIAGNOSIS — R110 Nausea: Secondary | ICD-10-CM | POA: Diagnosis not present

## 2015-06-14 DIAGNOSIS — E119 Type 2 diabetes mellitus without complications: Secondary | ICD-10-CM | POA: Insufficient documentation

## 2015-06-14 DIAGNOSIS — Z87891 Personal history of nicotine dependence: Secondary | ICD-10-CM | POA: Diagnosis not present

## 2015-06-14 DIAGNOSIS — Z79899 Other long term (current) drug therapy: Secondary | ICD-10-CM | POA: Diagnosis not present

## 2015-06-14 DIAGNOSIS — Z791 Long term (current) use of non-steroidal anti-inflammatories (NSAID): Secondary | ICD-10-CM | POA: Diagnosis not present

## 2015-06-14 DIAGNOSIS — R197 Diarrhea, unspecified: Secondary | ICD-10-CM | POA: Diagnosis present

## 2015-06-14 DIAGNOSIS — Z7984 Long term (current) use of oral hypoglycemic drugs: Secondary | ICD-10-CM | POA: Insufficient documentation

## 2015-06-14 NOTE — ED Notes (Signed)
Patient presents for RUQ abdominal pain, subjective fever, nausea, diarrhea x5 days. Reports approximately 20+ episodes of diarrhea, described as black and watery.

## 2015-06-15 LAB — CBC
HCT: 39.5 % (ref 36.0–46.0)
Hemoglobin: 13.8 g/dL (ref 12.0–15.0)
MCH: 29.7 pg (ref 26.0–34.0)
MCHC: 34.9 g/dL (ref 30.0–36.0)
MCV: 85.1 fL (ref 78.0–100.0)
Platelets: 309 10*3/uL (ref 150–400)
RBC: 4.64 MIL/uL (ref 3.87–5.11)
RDW: 12.6 % (ref 11.5–15.5)
WBC: 10.5 10*3/uL (ref 4.0–10.5)

## 2015-06-15 LAB — COMPREHENSIVE METABOLIC PANEL
ALT: 13 U/L — AB (ref 14–54)
AST: 17 U/L (ref 15–41)
Albumin: 3.3 g/dL — ABNORMAL LOW (ref 3.5–5.0)
Alkaline Phosphatase: 46 U/L (ref 38–126)
Anion gap: 7 (ref 5–15)
BILIRUBIN TOTAL: 0.6 mg/dL (ref 0.3–1.2)
BUN: 9 mg/dL (ref 6–20)
CHLORIDE: 105 mmol/L (ref 101–111)
CO2: 25 mmol/L (ref 22–32)
CREATININE: 0.5 mg/dL (ref 0.44–1.00)
Calcium: 8.6 mg/dL — ABNORMAL LOW (ref 8.9–10.3)
Glucose, Bld: 102 mg/dL — ABNORMAL HIGH (ref 65–99)
POTASSIUM: 3.5 mmol/L (ref 3.5–5.1)
Sodium: 137 mmol/L (ref 135–145)
TOTAL PROTEIN: 7 g/dL (ref 6.5–8.1)

## 2015-06-15 LAB — LIPASE, BLOOD: Lipase: 32 U/L (ref 11–51)

## 2015-06-15 LAB — URINALYSIS, ROUTINE W REFLEX MICROSCOPIC
Bilirubin Urine: NEGATIVE
Glucose, UA: NEGATIVE mg/dL
Hgb urine dipstick: NEGATIVE
Ketones, ur: NEGATIVE mg/dL
LEUKOCYTES UA: NEGATIVE
Nitrite: NEGATIVE
Protein, ur: NEGATIVE mg/dL
Specific Gravity, Urine: 1.008 (ref 1.005–1.030)
pH: 5 (ref 5.0–8.0)

## 2015-06-15 LAB — POC OCCULT BLOOD, ED: FECAL OCCULT BLD: NEGATIVE

## 2015-06-15 MED ORDER — DIPHENOXYLATE-ATROPINE 2.5-0.025 MG PO TABS: 2.0000 | ORAL_TABLET | Freq: Four times a day (QID) | ORAL | Status: DC | PRN

## 2015-06-15 NOTE — Discharge Instructions (Signed)
Food Choices to Help Relieve Diarrhea, Adult When you have diarrhea, the foods you eat and your eating habits are very important. Choosing the right foods and drinks can help relieve diarrhea. Also, because diarrhea can last up to 7 days, you need to replace lost fluids and electrolytes (such as sodium, potassium, and chloride) in order to help prevent dehydration.  WHAT GENERAL GUIDELINES DO I NEED TO FOLLOW?  Slowly drink 1 cup (8 oz) of fluid for each episode of diarrhea. If you are getting enough fluid, your urine will be clear or pale yellow.  Eat starchy foods. Some good choices include white rice, white toast, pasta, low-fiber cereal, baked potatoes (without the skin), saltine crackers, and bagels.  Avoid large servings of any cooked vegetables.  Limit fruit to two servings per day. A serving is  cup or 1 small piece.  Choose foods with less than 2 g of fiber per serving.  Limit fats to less than 8 tsp (38 g) per day.  Avoid fried foods.  Eat foods that have probiotics in them. Probiotics can be found in certain dairy products.  Avoid foods and beverages that may increase the speed at which food moves through the stomach and intestines (gastrointestinal tract). Things to avoid include:  High-fiber foods, such as dried fruit, raw fruits and vegetables, nuts, seeds, and whole grain foods.  Spicy foods and high-fat foods.  Foods and beverages sweetened with high-fructose corn syrup, honey, or sugar alcohols such as xylitol, sorbitol, and mannitol. WHAT FOODS ARE RECOMMENDED? Grains White rice. White, French, or pita breads (fresh or toasted), including plain rolls, buns, or bagels. White pasta. Saltine, soda, or graham crackers. Pretzels. Low-fiber cereal. Cooked cereals made with water (such as cornmeal, farina, or cream cereals). Plain muffins. Matzo. Melba toast. Zwieback.  Vegetables Potatoes (without the skin). Strained tomato and vegetable juices. Most well-cooked and canned  vegetables without seeds. Tender lettuce. Fruits Cooked or canned applesauce, apricots, cherries, fruit cocktail, grapefruit, peaches, pears, or plums. Fresh bananas, apples without skin, cherries, grapes, cantaloupe, grapefruit, peaches, oranges, or plums.  Meat and Other Protein Products Baked or boiled chicken. Eggs. Tofu. Fish. Seafood. Smooth peanut butter. Ground or well-cooked tender beef, ham, veal, lamb, pork, or poultry.  Dairy Plain yogurt, kefir, and unsweetened liquid yogurt. Lactose-free milk, buttermilk, or soy milk. Plain hard cheese. Beverages Sport drinks. Clear broths. Diluted fruit juices (except prune). Regular, caffeine-free sodas such as ginger ale. Water. Decaffeinated teas. Oral rehydration solutions. Sugar-free beverages not sweetened with sugar alcohols. Other Bouillon, broth, or soups made from recommended foods.  The items listed above may not be a complete list of recommended foods or beverages. Contact your dietitian for more options. WHAT FOODS ARE NOT RECOMMENDED? Grains Whole grain, whole wheat, bran, or rye breads, rolls, pastas, crackers, and cereals. Wild or brown rice. Cereals that contain more than 2 g of fiber per serving. Corn tortillas or taco shells. Cooked or dry oatmeal. Granola. Popcorn. Vegetables Raw vegetables. Cabbage, broccoli, Brussels sprouts, artichokes, baked beans, beet greens, corn, kale, legumes, peas, sweet potatoes, and yams. Potato skins. Cooked spinach and cabbage. Fruits Dried fruit, including raisins and dates. Raw fruits. Stewed or dried prunes. Fresh apples with skin, apricots, mangoes, pears, raspberries, and strawberries.  Meat and Other Protein Products Chunky peanut butter. Nuts and seeds. Beans and lentils. Bacon.  Dairy High-fat cheeses. Milk, chocolate milk, and beverages made with milk, such as milk shakes. Cream. Ice cream. Sweets and Desserts Sweet rolls, doughnuts, and sweet breads.   Pancakes and waffles. Fats and  Oils Butter. Cream sauces. Margarine. Salad oils. Plain salad dressings. Olives. Avocados.  Beverages Caffeinated beverages (such as coffee, tea, soda, or energy drinks). Alcoholic beverages. Fruit juices with pulp. Prune juice. Soft drinks sweetened with high-fructose corn syrup or sugar alcohols. Other Coconut. Hot sauce. Chili powder. Mayonnaise. Gravy. Cream-based or milk-based soups.  The items listed above may not be a complete list of foods and beverages to avoid. Contact your dietitian for more information. WHAT SHOULD I DO IF I BECOME DEHYDRATED? Diarrhea can sometimes lead to dehydration. Signs of dehydration include dark urine and dry mouth and skin. If you think you are dehydrated, you should rehydrate with an oral rehydration solution. These solutions can be purchased at pharmacies, retail stores, or online.  Drink -1 cup (120-240 mL) of oral rehydration solution each time you have an episode of diarrhea. If drinking this amount makes your diarrhea worse, try drinking smaller amounts more often. For example, drink 1-3 tsp (5-15 mL) every 5-10 minutes.  A general rule for staying hydrated is to drink 1-2 L of fluid per day. Talk to your health care provider about the specific amount you should be drinking each day. Drink enough fluids to keep your urine clear or pale yellow.   This information is not intended to replace advice given to you by your health care provider. Make sure you discuss any questions you have with your health care provider.   Document Released: 03/11/2003 Document Revised: 01/09/2014 Document Reviewed: 11/11/2012 Elsevier Interactive Patient Education 2016 [employer] Diarrhea Diarrhea is frequent loose and watery bowel movements. It can cause you to feel weak and dehydrated. Dehydration can cause you to become tired and thirsty, have a dry mouth, and have decreased urination that often is dark yellow. Diarrhea is a sign of another problem, most often an  infection that will not last long. In most cases, diarrhea typically lasts 2-3 days. However, it can last longer if it is a sign of something more serious. It is important to treat your diarrhea as directed by your caregiver to lessen or prevent future episodes of diarrhea. CAUSES  Some common causes include:  Gastrointestinal infections caused by viruses, bacteria, or parasites.  Food poisoning or food allergies.  Certain medicines, such as antibiotics, chemotherapy, and laxatives.  Artificial sweeteners and fructose.  Digestive disorders. HOME CARE INSTRUCTIONS  Ensure adequate fluid intake (hydration): Have 1 cup (8 oz) of fluid for each diarrhea episode. Avoid fluids that contain simple sugars or sports drinks, fruit juices, whole milk products, and sodas. Your urine should be clear or pale yellow if you are drinking enough fluids. Hydrate with an oral rehydration solution that you can purchase at pharmacies, retail stores, and online. You can prepare an oral rehydration solution at home by mixing the following ingredients together:   - tsp table salt.   tsp baking soda.   tsp salt substitute containing potassium chloride.  1  tablespoons sugar.  1 L (34 oz) of water.  Certain foods and beverages may increase the speed at which food moves through the gastrointestinal (GI) tract. These foods and beverages should be avoided and include:  Caffeinated and alcoholic beverages.  High-fiber foods, such as raw fruits and vegetables, nuts, seeds, and whole grain breads and cereals.  Foods and beverages sweetened with sugar alcohols, such as xylitol, sorbitol, and mannitol.  Some foods may be well tolerated and may help thicken stool including:  Starchy foods, such as rice, toast, pasta,   low-sugar cereal, oatmeal, grits, baked potatoes, crackers, and bagels.  Bananas.  Applesauce.  Add probiotic-rich foods to help increase healthy bacteria in the GI tract, such as yogurt and  fermented milk products.  Wash your hands well after each diarrhea episode.  Only take over-the-counter or prescription medicines as directed by your caregiver.  Take a warm bath to relieve any burning or pain from frequent diarrhea episodes. SEEK IMMEDIATE MEDICAL CARE IF:   You are unable to keep fluids down.  You have persistent vomiting.  You have blood in your stool, or your stools are black and tarry.  You do not urinate in 6-8 hours, or there is only a small amount of very dark urine.  You have abdominal pain that increases or localizes.  You have weakness, dizziness, confusion, or light-headedness.  You have a severe headache.  Your diarrhea gets worse or does not get better.  You have a fever or persistent symptoms for more than 2-3 days.  You have a fever and your symptoms suddenly get worse. MAKE SURE YOU:   Understand these instructions.  Will watch your condition.  Will get help right away if you are not doing well or get worse.   This information is not intended to replace advice given to you by your health care provider. Make sure you discuss any questions you have with your health care provider.   Document Released: 12/09/2001 Document Revised: 01/09/2014 Document Reviewed: 08/27/2011 Elsevier Interactive Patient Education 2016 Elsevier Inc.  

## 2015-06-15 NOTE — ED Provider Notes (Signed)
CSN: [id number]     Arrival date & time 06/14/15  2307 History   First MD Initiated Contact with Patient 06/15/15 0220     Chief Complaint  Patient presents with  . Abdominal Pain  . Diarrhea     (Consider location/radiation/quality/duration/timing/severity/associated sxs/prior Treatment) HPI Comments: Patient presents with diarrhea for the past 5 days. She has had persistent nausea with minimal vomiting. No fever. She reports some abdominal cramping but states no significant abdominal pain. Diarrhea has been non-bloody but she reports that her stool has turned black causing concern and prompting visit to the emergency room. No recent antibiotics, recent travel or history of C. Diff. She has been able to drink fluids but reports a loss of appetite.   Patient is a 45 y.o. female presenting with diarrhea. The history is provided by the patient. No language interpreter was used.  Diarrhea Associated symptoms: no abdominal pain, no chills, no fever and no myalgias     Past Medical History  Diagnosis Date  . Diabetes mellitus without complication (HCC)   . Hypertension   . High cholesterol   . Neuropathy (HCC)   . PCOS (polycystic ovarian syndrome)   . Anxiety   . Depression    Past Surgical History  Procedure Laterality Date  . Elbow surgery     Family History  Problem Relation Age of Onset  . Depression Mother   . Anxiety disorder Mother    Social History  Substance Use Topics  . Smoking status: Former Smoker    Types: Cigarettes    Quit date: 09/12/2000  . Smokeless tobacco: Never Used  . Alcohol Use: No     Comment: occasional   OB History    No data available     Review of Systems  Constitutional: Negative for fever and chills.  Respiratory: Negative.  Negative for shortness of breath.   Cardiovascular: Negative.  Negative for chest pain.  Gastrointestinal: Positive for nausea and diarrhea. Negative for abdominal pain.  Musculoskeletal: Negative.  Negative for  myalgias.  Skin: Negative.   Neurological: Negative.  Negative for light-headedness.      Allergies  Review of patient's allergies indicates no known allergies.  Home Medications   Prior to Admission medications   Medication Sig Start Date End Date Taking? Authorizing Provider  acetaminophen (TYLENOL) 650 MG CR tablet Take 650 mg by mouth every 8 (eight) hours as needed for pain.   Yes Historical Provider, MD  BIOTIN FORTE PO Take 1 tablet by mouth daily.   Yes Historical Provider, MD  celecoxib (CELEBREX) 200 MG capsule Take 200 mg by mouth daily.   Yes Historical Provider, MD  furosemide (LASIX) 20 MG tablet Take 20 mg by mouth daily.   Yes Historical Provider, MD  gabapentin (NEURONTIN) 600 MG tablet Take 800 mg by mouth 2 (two) times daily.    Yes Historical Provider, MD  ibuprofen (ADVIL,MOTRIN) 200 MG tablet Take 200 mg by mouth every 6 (six) hours as needed for moderate pain.   Yes Historical Provider, MD  insulin lispro (HUMALOG) 100 UNIT/ML injection Inject 15-20 Units into the skin 3 (three) times daily before meals.   Yes Historical Provider, MD  metFORMIN (GLUCOPHAGE) 1000 MG tablet Take 1,000 mg by mouth 2 (two) times daily with a meal.   Yes Historical Provider, MD  TOUJEO SOLOSTAR 300 UNIT/ML SOPN Inject 80 Units into the skin at bedtime. 06/02/15  Yes Historical Provider, MD  guaiFENesin (ROBITUSSIN) 100 MG/5ML liquid Take 5-10 mLs (100-200  mg total) by mouth every 4 (four) hours as needed for congestion. Patient not taking: Reported on 08/11/2014 02/26/14   Fayrene Helper, PA-C  insulin aspart (NOVOLOG) 100 UNIT/ML injection Inject 0-20 Units into the skin 3 (three) times daily with meals. Patient not taking: Reported on 08/11/2014 09/16/13   Maryruth Bun Rama, MD  insulin aspart (NOVOLOG) 100 UNIT/ML injection Inject 6 Units into the skin 3 (three) times daily with meals. Patient not taking: Reported on 11/28/2013 09/16/13   Maryruth Bun Rama, MD  insulin glargine (LANTUS) 100  UNIT/ML injection Inject 0.27 mLs (27 Units total) into the skin at bedtime. Patient not taking: Reported on 08/11/2014 09/16/13   Maryruth Bun Rama, MD   BP 131/88 mmHg  Pulse 82  Temp(Src) 98.4 F (36.9 C) (Oral)  Resp 17  SpO2 99%  LMP 05/18/2015 Physical Exam  Constitutional: She is oriented to person, place, and time. She appears well-developed and well-nourished.  HENT:  Head: Normocephalic.  Mouth/Throat: Oropharynx is clear and moist.  Neck: Normal range of motion. Neck supple.  Cardiovascular: Normal rate and regular rhythm.   No murmur heard. Pulmonary/Chest: Effort normal and breath sounds normal. She has no wheezes. She has no rales.  Abdominal: Soft. Bowel sounds are normal. There is no tenderness. There is no rebound and no guarding.  Musculoskeletal: Normal range of motion.  Neurological: She is alert and oriented to person, place, and time.  Skin: Skin is warm and dry. No rash noted.  Psychiatric: She has a normal mood and affect.    ED Course  Procedures (including critical care time) Labs Review Labs Reviewed  COMPREHENSIVE METABOLIC PANEL - Abnormal; Notable for the following:    Glucose, Bld 102 (*)    Calcium 8.6 (*)    Albumin 3.3 (*)    ALT 13 (*)    All other components within normal limits  LIPASE, BLOOD  CBC  URINALYSIS, ROUTINE W REFLEX MICROSCOPIC (NOT AT [facility])  POC OCCULT BLOOD, ED   Results for orders placed or performed during the hospital encounter of 06/14/15  Lipase, blood  Result Value Ref Range   Lipase 32 11 - 51 U/L  Comprehensive metabolic panel  Result Value Ref Range   Sodium 137 135 - 145 mmol/L   Potassium 3.5 3.5 - 5.1 mmol/L   Chloride 105 101 - 111 mmol/L   CO2 25 22 - 32 mmol/L   Glucose, Bld 102 (H) 65 - 99 mg/dL   BUN 9 6 - 20 mg/dL   Creatinine, Ser 4.09 0.44 - 1.00 mg/dL   Calcium 8.6 (L) 8.9 - 10.3 mg/dL   Total Protein 7.0 6.5 - 8.1 g/dL   Albumin 3.3 (L) 3.5 - 5.0 g/dL   AST 17 15 - 41 U/L   ALT 13 (L) 14 - 54  U/L   Alkaline Phosphatase 46 38 - 126 U/L   Total Bilirubin 0.6 0.3 - 1.2 mg/dL   GFR calc non Af Amer >60 >60 mL/min   GFR calc Af Amer >60 >60 mL/min   Anion gap 7 5 - 15  CBC  Result Value Ref Range   WBC 10.5 4.0 - 10.5 K/uL   RBC 4.64 3.87 - 5.11 MIL/uL   Hemoglobin 13.8 12.0 - 15.0 g/dL   HCT 81.1 91.4 - 78.2 %   MCV 85.1 78.0 - 100.0 fL   MCH 29.7 26.0 - 34.0 pg   MCHC 34.9 30.0 - 36.0 g/dL   RDW 95.6 21.3 - 08.6 %  Platelets 309 150 - 400 K/uL  Urinalysis, Routine w reflex microscopic  Result Value Ref Range   Color, Urine YELLOW YELLOW   APPearance CLEAR CLEAR   Specific Gravity, Urine 1.008 1.005 - 1.030   pH 5.0 5.0 - 8.0   Glucose, UA NEGATIVE NEGATIVE mg/dL   Hgb urine dipstick NEGATIVE NEGATIVE   Bilirubin Urine NEGATIVE NEGATIVE   Ketones, ur NEGATIVE NEGATIVE mg/dL   Protein, ur NEGATIVE NEGATIVE mg/dL   Nitrite NEGATIVE NEGATIVE   Leukocytes, UA NEGATIVE NEGATIVE    Imaging Review No results found. I have personally reviewed and evaluated these images and lab results as part of my medical decision-making.   EKG Interpretation None      MDM   Final diagnoses:  None    1. Diarrhea  Patient presents with 5 days of profuse diarrhea, without fever, pain or vomiting. She appears well and comfortable. Guaiac negative stool. VSS. She is drinking plenty of fluids at home and does not appear dehydrated. Immodium without relief. She can be discharged home with Lomotil and PCP follow up this week for recheck.     Elpidio AnisShari Tenoch Mcclure, PA-C 06/15/15 0350  April Palumbo, MD 06/15/15 [phone]

## 2015-07-27 ENCOUNTER — Emergency Department (HOSPITAL_COMMUNITY): Payer: Managed Care, Other (non HMO)

## 2015-07-27 ENCOUNTER — Encounter (HOSPITAL_COMMUNITY): Payer: Self-pay

## 2015-07-27 ENCOUNTER — Emergency Department (HOSPITAL_COMMUNITY)
Admission: EM | Admit: 2015-07-27 | Discharge: 2015-07-27 | Disposition: A | Discharge status: Home / Self Care | Payer: Managed Care, Other (non HMO) | Attending: Emergency Medicine | Admitting: Emergency Medicine

## 2015-07-27 DIAGNOSIS — Z7984 Long term (current) use of oral hypoglycemic drugs: Secondary | ICD-10-CM | POA: Insufficient documentation

## 2015-07-27 DIAGNOSIS — F319 Bipolar disorder, unspecified: Secondary | ICD-10-CM | POA: Insufficient documentation

## 2015-07-27 DIAGNOSIS — Z87891 Personal history of nicotine dependence: Secondary | ICD-10-CM | POA: Diagnosis not present

## 2015-07-27 DIAGNOSIS — H81399 Other peripheral vertigo, unspecified ear: Secondary | ICD-10-CM | POA: Diagnosis not present

## 2015-07-27 DIAGNOSIS — R112 Nausea with vomiting, unspecified: Secondary | ICD-10-CM

## 2015-07-27 DIAGNOSIS — Z794 Long term (current) use of insulin: Secondary | ICD-10-CM | POA: Insufficient documentation

## 2015-07-27 DIAGNOSIS — E119 Type 2 diabetes mellitus without complications: Secondary | ICD-10-CM | POA: Diagnosis not present

## 2015-07-27 DIAGNOSIS — R197 Diarrhea, unspecified: Secondary | ICD-10-CM | POA: Diagnosis not present

## 2015-07-27 DIAGNOSIS — I10 Essential (primary) hypertension: Secondary | ICD-10-CM | POA: Diagnosis not present

## 2015-07-27 DIAGNOSIS — F1290 Cannabis use, unspecified, uncomplicated: Secondary | ICD-10-CM | POA: Insufficient documentation

## 2015-07-27 DIAGNOSIS — Z7982 Long term (current) use of aspirin: Secondary | ICD-10-CM | POA: Diagnosis not present

## 2015-07-27 DIAGNOSIS — Z791 Long term (current) use of non-steroidal anti-inflammatories (NSAID): Secondary | ICD-10-CM | POA: Insufficient documentation

## 2015-07-27 DIAGNOSIS — Z79899 Other long term (current) drug therapy: Secondary | ICD-10-CM | POA: Diagnosis not present

## 2015-07-27 DIAGNOSIS — R42 Dizziness and giddiness: Secondary | ICD-10-CM | POA: Diagnosis present

## 2015-07-27 LAB — COMPREHENSIVE METABOLIC PANEL
ALT: 19 U/L (ref 14–54)
AST: 22 U/L (ref 15–41)
Albumin: 3.7 g/dL (ref 3.5–5.0)
Alkaline Phosphatase: 48 U/L (ref 38–126)
Anion gap: 9 (ref 5–15)
BILIRUBIN TOTAL: 1.2 mg/dL (ref 0.3–1.2)
BUN: 12 mg/dL (ref 6–20)
CO2: 27 mmol/L (ref 22–32)
CREATININE: 0.61 mg/dL (ref 0.44–1.00)
Calcium: 8.9 mg/dL (ref 8.9–10.3)
Chloride: 102 mmol/L (ref 101–111)
GFR calc Af Amer: >60 mL/min (ref >60)
Glucose, Bld: 125 mg/dL — ABNORMAL HIGH (ref 65–99)
Potassium: 3.8 mmol/L (ref 3.5–5.1)
Sodium: 138 mmol/L (ref 135–145)
TOTAL PROTEIN: 8 g/dL (ref 6.5–8.1)

## 2015-07-27 LAB — CBC
HCT: 41.9 % (ref 36.0–46.0)
Hemoglobin: 14.8 g/dL (ref 12.0–15.0)
MCH: 29.8 pg (ref 26.0–34.0)
MCHC: 35.3 g/dL (ref 30.0–36.0)
MCV: 84.5 fL (ref 78.0–100.0)
PLATELETS: 347 10*3/uL (ref 150–400)
RBC: 4.96 MIL/uL (ref 3.87–5.11)
RDW: 12.8 % (ref 11.5–15.5)
WBC: 13.6 10*3/uL — AB (ref 4.0–10.5)

## 2015-07-27 LAB — URINALYSIS, ROUTINE W REFLEX MICROSCOPIC
Glucose, UA: NEGATIVE mg/dL
Hgb urine dipstick: NEGATIVE
KETONES UR: 15 mg/dL — AB
LEUKOCYTES UA: NEGATIVE
NITRITE: NEGATIVE
PROTEIN: 30 mg/dL — AB
Specific Gravity, Urine: 1.026 (ref 1.005–1.030)
pH: 6 (ref 5.0–8.0)

## 2015-07-27 LAB — URINE MICROSCOPIC-ADD ON: RBC / HPF: NONE SEEN RBC/hpf (ref 0–5)

## 2015-07-27 LAB — CBG MONITORING, ED: Glucose-Capillary: 120 mg/dL — ABNORMAL HIGH (ref 65–99)

## 2015-07-27 LAB — LIPASE, BLOOD: Lipase: 19 U/L (ref 11–51)

## 2015-07-27 LAB — PREGNANCY, URINE: PREG TEST UR: NEGATIVE

## 2015-07-27 MED ORDER — MECLIZINE HCL 25 MG PO TABS
25.0000 mg | ORAL_TABLET | Freq: Once | ORAL | Status: AC
  Administered 2015-07-27: 25 mg via ORAL
  Filled 2015-07-27: qty 1

## 2015-07-27 MED ORDER — MECLIZINE HCL 25 MG PO TABS: 25.0000 mg | ORAL_TABLET | Freq: Three times a day (TID) | ORAL | 0 refills | Status: DC | PRN

## 2015-07-27 MED ORDER — SODIUM CHLORIDE 0.9 % IV BOLUS (SEPSIS)
1000.0000 mL | Freq: Once | INTRAVENOUS | Status: AC
  Administered 2015-07-27: 1000 mL via INTRAVENOUS

## 2015-07-27 MED ORDER — ONDANSETRON HCL 4 MG PO TABS: 4.0000 mg | ORAL_TABLET | Freq: Three times a day (TID) | ORAL | 0 refills | Status: DC | PRN

## 2015-07-27 MED ORDER — ONDANSETRON HCL 4 MG/2ML IJ SOLN
4.0000 mg | Freq: Once | INTRAMUSCULAR | Status: AC
  Administered 2015-07-27: 4 mg via INTRAVENOUS
  Filled 2015-07-27: qty 2

## 2015-07-27 NOTE — ED Triage Notes (Signed)
Pt has had nausea vomiting off/on since Sunday.  Pt is a diabetic and has gotten it under control from 500's.  Pt has had no appetite.  Abdominal pain. No fever.  Pt having dizziness today.  Pt did not take her long acting insulin last night and has modified her humalog also.

## 2015-07-27 NOTE — ED Notes (Signed)
Waiting for ride and care management

## 2015-07-27 NOTE — ED Notes (Signed)
hospitalist at bedside

## 2015-07-27 NOTE — ED Notes (Signed)
Off floor for testing 

## 2015-07-27 NOTE — Discharge Instructions (Signed)
Take the prescriptions as directed.  Increase your fluid intake (ie:  Gatoraide) for the next few days.  Eat a bland diet and advance to your regular diet slowly as you can tolerate it.   Avoid full strength juices, as well as milk and milk products until your diarrhea has resolved.  Move slowly when moving from laying/sitting to standing.  Call your regular medical doctor today to schedule a follow up appointment this week.  Return to the Emergency Department immediately if not improving (or even worsening) despite taking the medicines as prescribed, any black or bloody stool or vomit, if you develop a fever over "101," or for any other concerns.
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
## 2015-09-02 ENCOUNTER — Other Ambulatory Visit: Payer: Self-pay | Admitting: Ophthalmology

## 2015-09-07 ENCOUNTER — Encounter (HOSPITAL_COMMUNITY): Payer: Self-pay | Admitting: Anesthesiology

## 2015-09-07 ENCOUNTER — Ambulatory Visit: Admit: 2015-09-07 | Payer: 59 | Admitting: Ophthalmology

## 2015-09-07 ENCOUNTER — Encounter (HOSPITAL_COMMUNITY)
Admission: RE | Disposition: A | Discharge status: Home / Self Care | Payer: Self-pay | Source: Ambulatory Visit | Attending: Ophthalmology

## 2015-09-07 ENCOUNTER — Ambulatory Visit (HOSPITAL_COMMUNITY): Payer: 59 | Admitting: Anesthesiology

## 2015-09-07 ENCOUNTER — Other Ambulatory Visit: Payer: Self-pay | Admitting: Ophthalmology

## 2015-09-07 ENCOUNTER — Ambulatory Visit (HOSPITAL_COMMUNITY)
Admission: RE | Admit: 2015-09-07 | Discharge: 2015-09-07 | Disposition: A | Discharge status: Home / Self Care | Payer: 59 | Source: Ambulatory Visit | Attending: Ophthalmology | Admitting: Ophthalmology

## 2015-09-07 DIAGNOSIS — Z794 Long term (current) use of insulin: Secondary | ICD-10-CM | POA: Diagnosis not present

## 2015-09-07 DIAGNOSIS — H4089 Other specified glaucoma: Secondary | ICD-10-CM | POA: Insufficient documentation

## 2015-09-07 DIAGNOSIS — Z79899 Other long term (current) drug therapy: Secondary | ICD-10-CM | POA: Diagnosis not present

## 2015-09-07 DIAGNOSIS — Z7982 Long term (current) use of aspirin: Secondary | ICD-10-CM | POA: Insufficient documentation

## 2015-09-07 DIAGNOSIS — E103599 Type 1 diabetes mellitus with proliferative diabetic retinopathy without macular edema, unspecified eye: Secondary | ICD-10-CM | POA: Insufficient documentation

## 2015-09-07 DIAGNOSIS — H4311 Vitreous hemorrhage, right eye: Secondary | ICD-10-CM | POA: Diagnosis not present

## 2015-09-07 DIAGNOSIS — H3341 Traction detachment of retina, right eye: Secondary | ICD-10-CM | POA: Diagnosis not present

## 2015-09-07 DIAGNOSIS — Z87891 Personal history of nicotine dependence: Secondary | ICD-10-CM | POA: Diagnosis not present

## 2015-09-07 DIAGNOSIS — E1039 Type 1 diabetes mellitus with other diabetic ophthalmic complication: Secondary | ICD-10-CM | POA: Diagnosis not present

## 2015-09-07 DIAGNOSIS — I10 Essential (primary) hypertension: Secondary | ICD-10-CM | POA: Insufficient documentation

## 2015-09-07 HISTORY — PX: PARS PLANA VITRECTOMY: SHX2166

## 2015-09-07 HISTORY — PX: INSERTION OF AHMED VALVE: SHX6254

## 2015-09-07 HISTORY — PX: LASER PHOTO ABLATION: SHX5942

## 2015-09-07 LAB — BASIC METABOLIC PANEL
Anion gap: 9 (ref 5–15)
BUN: 10 mg/dL (ref 6–20)
CALCIUM: 8.7 mg/dL — AB (ref 8.9–10.3)
CO2: 23 mmol/L (ref 22–32)
Chloride: 103 mmol/L (ref 101–111)
Creatinine, Ser: 0.5 mg/dL (ref 0.44–1.00)
GFR calc Af Amer: >60 mL/min (ref >60)
GLUCOSE: 149 mg/dL — AB (ref 65–99)
POTASSIUM: 3.7 mmol/L (ref 3.5–5.1)
Sodium: 135 mmol/L (ref 135–145)

## 2015-09-07 LAB — CBC
HEMATOCRIT: 35.9 % — AB (ref 36.0–46.0)
Hemoglobin: 12.6 g/dL (ref 12.0–15.0)
MCH: 29.7 pg (ref 26.0–34.0)
MCHC: 35.1 g/dL (ref 30.0–36.0)
MCV: 84.7 fL (ref 78.0–100.0)
PLATELETS: 295 10*3/uL (ref 150–400)
RBC: 4.24 MIL/uL (ref 3.87–5.11)
RDW: 12.7 % (ref 11.5–15.5)
WBC: 12.3 10*3/uL — ABNORMAL HIGH (ref 4.0–10.5)

## 2015-09-07 LAB — GLUCOSE, CAPILLARY
GLUCOSE-CAPILLARY: 162 mg/dL — AB (ref 65–99)
GLUCOSE-CAPILLARY: 121 mg/dL — AB (ref 65–99)

## 2015-09-07 LAB — HCG, SERUM, QUALITATIVE: PREG SERUM: NEGATIVE

## 2015-09-07 SURGERY — Surgical Case
Anesthesia: *Unknown

## 2015-09-07 SURGERY — PARS PLANA VITRECTOMY WITH 25 GAUGE
Anesthesia: Monitor Anesthesia Care | Site: Eye | Laterality: Right

## 2015-09-07 SURGERY — PARS PLANA VITRECTOMY WITH 25 GAUGE
Anesthesia: Moderate Sedation | Laterality: Right

## 2015-09-07 MED ORDER — PHENYLEPHRINE 40 MCG/ML (10ML) SYRINGE FOR IV PUSH (FOR BLOOD PRESSURE SUPPORT)
PREFILLED_SYRINGE | INTRAVENOUS | Status: AC
  Filled 2015-09-07: qty 10

## 2015-09-07 MED ORDER — EPINEPHRINE HCL 1 MG/ML IJ SOLN
INTRAMUSCULAR | Status: AC
  Filled 2015-09-07: qty 1

## 2015-09-07 MED ORDER — BSS PLUS IO SOLN
INTRAOCULAR | Status: AC
  Filled 2015-09-07: qty 500

## 2015-09-07 MED ORDER — PHENYLEPHRINE HCL 2.5 % OP SOLN
1.0000 [drp] | OPHTHALMIC | Status: AC | PRN
  Administered 2015-09-07 (×3): 1 [drp] via OPHTHALMIC
  Filled 2015-09-07: qty 2

## 2015-09-07 MED ORDER — SUGAMMADEX SODIUM 200 MG/2ML IV SOLN
INTRAVENOUS | Status: DC | PRN
  Administered 2015-09-07: 200 mg via INTRAVENOUS

## 2015-09-07 MED ORDER — PROPOFOL 10 MG/ML IV BOLUS
INTRAVENOUS | Status: DC | PRN
  Administered 2015-09-07: 170 mg via INTRAVENOUS

## 2015-09-07 MED ORDER — GENTAMICIN SULFATE 40 MG/ML IJ SOLN
INTRAMUSCULAR | Status: DC | PRN
  Administered 2015-09-07: 80 mg

## 2015-09-07 MED ORDER — DEXAMETHASONE SODIUM PHOSPHATE 10 MG/ML IJ SOLN
INTRAMUSCULAR | Status: AC
  Filled 2015-09-07: qty 1

## 2015-09-07 MED ORDER — MIDAZOLAM HCL 5 MG/5ML IJ SOLN
INTRAMUSCULAR | Status: DC | PRN
  Administered 2015-09-07 (×2): 1 mg via INTRAVENOUS

## 2015-09-07 MED ORDER — FENTANYL CITRATE (PF) 100 MCG/2ML IJ SOLN
INTRAMUSCULAR | Status: DC | PRN
  Administered 2015-09-07 (×2): 50 ug via INTRAVENOUS

## 2015-09-07 MED ORDER — CEFAZOLIN SODIUM-DEXTROSE 2-3 GM-% IV SOLR
INTRAVENOUS | Status: DC | PRN
  Administered 2015-09-07: 2 g via INTRAVENOUS

## 2015-09-07 MED ORDER — HYPROMELLOSE (GONIOSCOPIC) 2.5 % OP SOLN
OPHTHALMIC | Status: DC | PRN
  Administered 2015-09-07: 1 [drp] via OPHTHALMIC

## 2015-09-07 MED ORDER — SODIUM CHLORIDE 0.9 % IV SOLN
INTRAVENOUS | Status: DC
  Administered 2015-09-07: 10 mL/h via INTRAVENOUS

## 2015-09-07 MED ORDER — ROCURONIUM BROMIDE 100 MG/10ML IV SOLN
INTRAVENOUS | Status: DC | PRN
Start: 2015-09-07 — End: 2015-09-07
  Administered 2015-09-07: 40 mg via INTRAVENOUS

## 2015-09-07 MED ORDER — ONDANSETRON HCL 4 MG/2ML IJ SOLN
INTRAMUSCULAR | Status: DC | PRN
  Administered 2015-09-07 (×2): 4 mg via INTRAVENOUS

## 2015-09-07 MED ORDER — EPINEPHRINE HCL 1 MG/ML IJ SOLN
INTRAOCULAR | Status: DC | PRN
  Administered 2015-09-07: 500 mL

## 2015-09-07 MED ORDER — SUGAMMADEX SODIUM 200 MG/2ML IV SOLN
INTRAVENOUS | Status: AC
  Filled 2015-09-07: qty 2

## 2015-09-07 MED ORDER — NA CHONDROIT SULF-NA HYALURON 40-30 MG/ML IO SOLN
INTRAOCULAR | Status: AC
  Filled 2015-09-07: qty 0.5

## 2015-09-07 MED ORDER — FENTANYL CITRATE (PF) 100 MCG/2ML IJ SOLN
25.0000 ug | INTRAMUSCULAR | Status: DC | PRN
  Administered 2015-09-07: 25 ug via INTRAVENOUS
  Administered 2015-09-07: 50 ug via INTRAVENOUS

## 2015-09-07 MED ORDER — TETRACAINE HCL 0.5 % OP SOLN
OPHTHALMIC | Status: AC
  Filled 2015-09-07: qty 2

## 2015-09-07 MED ORDER — LIDOCAINE HCL (CARDIAC) 20 MG/ML IV SOLN
INTRAVENOUS | Status: DC | PRN
  Administered 2015-09-07: 100 mg via INTRAVENOUS

## 2015-09-07 MED ORDER — LACTATED RINGERS IV SOLN: INTRAVENOUS | Status: DC | PRN

## 2015-09-07 MED ORDER — ARTIFICIAL TEARS OP OINT
TOPICAL_OINTMENT | OPHTHALMIC | Status: AC
  Filled 2015-09-07: qty 3.5

## 2015-09-07 MED ORDER — MIDAZOLAM HCL 2 MG/2ML IJ SOLN
INTRAMUSCULAR | Status: AC
  Filled 2015-09-07: qty 2

## 2015-09-07 MED ORDER — FENTANYL CITRATE (PF) 100 MCG/2ML IJ SOLN
INTRAMUSCULAR | Status: AC
  Filled 2015-09-07: qty 2

## 2015-09-07 MED ORDER — LACTATED RINGERS IV SOLN
INTRAVENOUS | Status: DC | PRN
  Administered 2015-09-07: 15:00:00 via INTRAVENOUS

## 2015-09-07 MED ORDER — GENTAMICIN SULFATE 40 MG/ML IJ SOLN
INTRAMUSCULAR | Status: AC
  Filled 2015-09-07: qty 2

## 2015-09-07 MED ORDER — POLYMYXIN B SULFATE 500000 UNITS IJ SOLR
INTRAMUSCULAR | Status: DC | PRN
  Administered 2015-09-07: 500000 [IU]

## 2015-09-07 MED ORDER — LIDOCAINE HCL 2 % IJ SOLN
INTRAMUSCULAR | Status: DC | PRN
  Administered 2015-09-07: 10 mL

## 2015-09-07 MED ORDER — PROMETHAZINE HCL 25 MG/ML IJ SOLN: 6.2500 mg | INTRAMUSCULAR | Status: DC | PRN

## 2015-09-07 MED ORDER — NA CHONDROIT SULF-NA HYALURON 40-30 MG/ML IO SOLN
INTRAOCULAR | Status: DC | PRN
  Administered 2015-09-07: 0.5 mL via INTRAOCULAR

## 2015-09-07 MED ORDER — HYPROMELLOSE (GONIOSCOPIC) 2.5 % OP SOLN
OPHTHALMIC | Status: AC
  Filled 2015-09-07: qty 15

## 2015-09-07 MED ORDER — ONDANSETRON HCL 4 MG/2ML IJ SOLN
INTRAMUSCULAR | Status: AC
  Filled 2015-09-07: qty 2

## 2015-09-07 MED ORDER — SODIUM CHLORIDE 0.9 % IJ SOLN
INTRAMUSCULAR | Status: AC
  Filled 2015-09-07: qty 10

## 2015-09-07 MED ORDER — SODIUM CHLORIDE 0.9 % IJ SOLN
INTRAMUSCULAR | Status: DC | PRN
  Administered 2015-09-07: 10 mL

## 2015-09-07 MED ORDER — SUCCINYLCHOLINE CHLORIDE 20 MG/ML IJ SOLN
INTRAMUSCULAR | Status: DC | PRN
  Administered 2015-09-07: 120 mg via INTRAVENOUS

## 2015-09-07 MED ORDER — DEXAMETHASONE SODIUM PHOSPHATE 10 MG/ML IJ SOLN
INTRAMUSCULAR | Status: DC | PRN
  Administered 2015-09-07: 10 mg

## 2015-09-07 MED ORDER — SODIUM HYALURONATE 10 MG/ML IO SOLN
INTRAOCULAR | Status: AC
  Filled 2015-09-07: qty 0.85

## 2015-09-07 MED ORDER — ARTIFICIAL TEARS OP OINT
TOPICAL_OINTMENT | OPHTHALMIC | Status: DC | PRN
  Administered 2015-09-07: 1 via OPHTHALMIC

## 2015-09-07 MED ORDER — CEFAZOLIN SODIUM 1 G IJ SOLR
INTRAMUSCULAR | Status: AC
  Filled 2015-09-07: qty 20

## 2015-09-07 MED ORDER — POLYMYXIN B SULFATE 500000 UNITS IJ SOLR
INTRAMUSCULAR | Status: AC
  Filled 2015-09-07: qty 1

## 2015-09-07 MED ORDER — BSS IO SOLN
INTRAOCULAR | Status: DC | PRN
  Administered 2015-09-07: 15 mL via INTRAOCULAR

## 2015-09-07 MED ORDER — LIDOCAINE HCL 2 % IJ SOLN
INTRAMUSCULAR | Status: AC
  Filled 2015-09-07: qty 20

## 2015-09-07 MED ORDER — CYCLOPENTOLATE HCL 1 % OP SOLN
1.0000 [drp] | OPHTHALMIC | Status: AC | PRN
  Administered 2015-09-07 (×3): 1 [drp] via OPHTHALMIC
  Filled 2015-09-07: qty 2

## 2015-09-07 MED ORDER — GATIFLOXACIN 0.5 % OP SOLN
1.0000 [drp] | OPHTHALMIC | Status: AC | PRN
  Administered 2015-09-07 (×3): 1 [drp] via OPHTHALMIC
  Filled 2015-09-07: qty 2.5

## 2015-09-07 MED ORDER — EPHEDRINE 5 MG/ML INJ
INTRAVENOUS | Status: AC
  Filled 2015-09-07: qty 10

## 2015-09-07 SURGICAL SUPPLY — 71 items
ALLOGRAFT TUTOPLAST 1.5X1.5 (Ophthalmic Related) ×1 IMPLANT
APPLICATOR COTTON TIP 6IN STRL (MISCELLANEOUS) ×3 IMPLANT
APPLICATOR DR MATTHEWS STRL (MISCELLANEOUS) IMPLANT
BLADE 10 SAFETY STRL DISP (BLADE) ×3 IMPLANT
BLADE MVR KNIFE 20G (BLADE) ×6 IMPLANT
CANNULA ANT CHAM MAIN (OPHTHALMIC RELATED) IMPLANT
CANNULA VLV SOFT TIP 25GA (OPHTHALMIC) ×3 IMPLANT
CORDS BIPOLAR (ELECTRODE) ×3 IMPLANT
COVER MAYO STAND STRL (DRAPES) IMPLANT
DRAPE INCISE 51X51 W/FILM STRL (DRAPES) IMPLANT
DRAPE OPHTHALMIC 77X100 STRL (CUSTOM PROCEDURE TRAY) ×3 IMPLANT
ERASER HMR WETFIELD 23G BP (MISCELLANEOUS) IMPLANT
FILTER BLUE MILLIPORE (MISCELLANEOUS) IMPLANT
FORCEPS ECKARDT ILM 25G SERR (OPHTHALMIC RELATED) IMPLANT
FORCEPS GRIESHABER ILM 25G A (INSTRUMENTS) IMPLANT
FORCEPS HORIZONTAL 25G DISP (OPHTHALMIC RELATED) IMPLANT
FORCEPS ILM 25G DSP TIP (MISCELLANEOUS) IMPLANT
GAS AUTO FILL CONSTEL (OPHTHALMIC)
GAS AUTO FILL CONSTELLATION (OPHTHALMIC) IMPLANT
GAS OPHTHALMIC (MISCELLANEOUS) IMPLANT
GLOVE SS BIOGEL STRL SZ 8.5 (GLOVE) ×1 IMPLANT
GLOVE SUPERSENSE BIOGEL SZ 8.5 (GLOVE) ×2
GOWN STRL REUS W/ TWL LRG LVL3 (GOWN DISPOSABLE) ×1 IMPLANT
GOWN STRL REUS W/ TWL XL LVL3 (GOWN DISPOSABLE) ×1 IMPLANT
GOWN STRL REUS W/TWL LRG LVL3 (GOWN DISPOSABLE) ×2
GOWN STRL REUS W/TWL XL LVL3 (GOWN DISPOSABLE) ×2
KIT BASIN OR (CUSTOM PROCEDURE TRAY) ×3 IMPLANT
KIT PERFLUORON PROCEDURE 5ML (MISCELLANEOUS) IMPLANT
KNIFE CRESCENT 2.5 55 ANG (BLADE) ×3 IMPLANT
LENS BIOM SUPER VIEW SET DISP (OPHTHALMIC RELATED) ×3 IMPLANT
MICROPICK 25G (MISCELLANEOUS)
NEEDLE 18GX1X1/2 (RX/OR ONLY) (NEEDLE) ×3 IMPLANT
NEEDLE 25GX 5/8IN NON SAFETY (NEEDLE) ×3 IMPLANT
NEEDLE FILTER BLUNT 18X 1/2SAF (NEEDLE)
NEEDLE FILTER BLUNT 18X1 1/2 (NEEDLE) IMPLANT
NEEDLE HYPO 25GX1X1/2 BEV (NEEDLE) ×3 IMPLANT
NEEDLE HYPO 30X.5 LL (NEEDLE) IMPLANT
NS IRRIG 1000ML POUR BTL (IV SOLUTION) ×3 IMPLANT
PACK FRAGMATOME (OPHTHALMIC) ×3 IMPLANT
PACK VITRECTOMY CUSTOM (CUSTOM PROCEDURE TRAY) ×3 IMPLANT
PACK VITRECTOMY PIC MCHSVP (PACKS) IMPLANT
PAD ARMBOARD 7.5X6 YLW CONV (MISCELLANEOUS) ×6 IMPLANT
PAK PIK VITRECTOMY CVS 25GA (OPHTHALMIC) ×3 IMPLANT
PENCIL BIPOLAR 25GA STR DISP (OPHTHALMIC RELATED) IMPLANT
PICK MICROPICK 25G (MISCELLANEOUS) IMPLANT
PROBE LASER ILLUM FLEX CVD 25G (OPHTHALMIC) IMPLANT
REPL STRA BRUSH NEEDLE (NEEDLE) IMPLANT
RESERVOIR BACK FLUSH (MISCELLANEOUS) IMPLANT
ROLLS DENTAL (MISCELLANEOUS) IMPLANT
SCRAPER DIAMOND 25GA (OPHTHALMIC RELATED) IMPLANT
SET FLUID INJECTOR (SET/KITS/TRAYS/PACK) IMPLANT
SET INJECTOR OIL FLUID CONSTEL (OPHTHALMIC) IMPLANT
STOCKINETTE IMPERVIOUS 9X36 MD (GAUZE/BANDAGES/DRESSINGS) ×6 IMPLANT
STOPCOCK 4 WAY LG BORE MALE ST (IV SETS) IMPLANT
SUT ETHILON 10 0 CS140 6 (SUTURE) IMPLANT
SUT ETHILON 8 0 BV130 4 (SUTURE) IMPLANT
SUT ETHILON 8 0 TG100 8 (SUTURE) ×3 IMPLANT
SUT MERSILENE 5 0 RD 1 DA (SUTURE) IMPLANT
SUT PROLENE 10 0 CIF 4 DA (SUTURE) IMPLANT
SUT SILK 2 0 (SUTURE) ×4
SUT SILK 2-0 18XBRD TIE 12 (SUTURE) ×2 IMPLANT
SUT VICRYL 7 0 TG140 8 (SUTURE) IMPLANT
SYR 20CC LL (SYRINGE) IMPLANT
SYR 30ML SLIP (SYRINGE) IMPLANT
SYR 5ML LL (SYRINGE) ×3 IMPLANT
SYR TB 1ML LUER SLIP (SYRINGE) IMPLANT
TOWEL OR 17X24 6PK STRL BLUE (TOWEL DISPOSABLE) ×6 IMPLANT
TUTOPLAST 1.5X1.5 (Ophthalmic Related) ×3 IMPLANT
VALVE GLAUCOMA AHMED (Prosthesis & Implant Heart) ×3 IMPLANT
WATER STERILE IRR 1000ML POUR (IV SOLUTION) ×3 IMPLANT
WIPE INSTRUMENT VISIWIPE 73X73 (MISCELLANEOUS) ×3 IMPLANT

## 2015-09-07 NOTE — Op Note (Signed)
Preoperative diagnosis #1 neovascular glaucoma, right eye worsening despite maximal tolerated medical therapy  #2 of diabetic retinopathy with stage I diabetes progressive despite panretinal photocoagulation  #3 vitreous hemorrhage right eye  Postoperative diagnosis Diagnosis same  Procedure #1 posterior vitrectomy with panretinal endolaser photo regulation of the right eye  #2 membrane peel right eye  #3 insertion of aqueous drainage device-Ahmed  valve right eye  #4 scleral patch graft right eye-Tudoplast  Surgeon Edmon CrapeGary a Onica Davidovich M.D.  Anesthesia Gen.  Complications none    Indications for surgery as and type 1 diabetes neglected with neck with neglected diabetes mellitus, complications ophthalmic in each eye. Patient has progressive recurrent proliferative diabetic retinopathy, neovascular glaucoma unrelenting and progressive. This in attempt to perform vitrectomy with endolaser photocoagulation in order to induce quietness of retinopathy but also to control the intraocular pressure with an Ahmed valve.  Patient stands the risk of anesthesia including rare occurrence death but also to the eye from underlying condition including but not limited to hemorrhage, infection, scarring, need another surgery, no change of vision, loss of vision and progression of disease despite intervention. At appropriate signed consent was obtained patient taken to the operating room.  In the operating room appropriate monitoring was followed by general anesthesia. The right ocular region was and identifying the timeout with surgical staff and surgeon. Thereafter the right periocular region sterilely prepped and draped usual fashion. Second surgical timeout was carried out [place] the right eye. At this time a lisp implied. 25-gauge trocar placed in the inferotemporal quadrant placement or 5 visually. Infusion turned on. Spear trochars applied. Cortectomy was begun. Endolaser panretinal photic regulation  delivered a fat after elevation of the posterior hyaloid was on successful off the optic nerve. Neovascular position sites along the inferotemporal superotemporal and superonasal arcades were identified these are trimmed. Posterior hyaloid elevated anterior to the equator through 60. Care is taken to avoid the native lens. Thereafter at this time panretinal photic regulation was in completed. Preretinal vitreous hemorrhage had been cleared and aspirated. The nerve was still pink. At this time attention was drawn to closure of the trocar sites. The superior trochars removed and the closures these sites were close a several Vicryl suture. Limbal peritomy was in fashion and is in the superonasal quadrant extending from the 2:30 position superior superior superonasally to the 11:30 position. The superior rectus muscle isolated on 2-0 silk ties. Similarly the in lateral rectus was isolated. Superotemporal quadrant was entered with with Andria MeuseStevens scissors. The Ahmed valve was brought and irrigated the through the tube with balanced salt solution to prime the. The the implant was also irrigated with bug juice to prevent infection. The Ahmed valve was now placed in superotemporal quadrant and the footplate secured with 8-0 nylon sutures and superotemporal quadrant posterior to the insertion of the rectus muscles. To was brought forward a shelved scleral flap was in carried was in fashion at the limbus with a limbus based flap. Hemostasis was obtained with bipolar cautery. At the base of the flap into the clear cornea 20-gauge MVR blade was then used into the anterior chamber. At to the proper length. Was inserted freely with minimal restriction into the anterior chamber. Anterior chamber had been deepened previously with Viscoat. As time 10-0 nylon sutures were then used on either side of the tube to secure the scleral flap and a compressive fashion on either side to prevent egress of fluid from the anterior chamber. For  nylon sutures were then used to secure  the scleral flap and a flat fashion. At this time crescent blade was then used to de-epithelialized the superior corneal limbus. The conjunctiva was now brought forward. I elected to secured Tudoplast overlying the scleral flap as well as overlying the stay suture of 10-0 nylon which is been used to prevent tube retraction. Tudoplast secured with 4 buried 7-0 Vicryl sutures. The conjunctiva was now brought for close and the winglike fashion inferiorly and also superonasally. At the limbus I elected used to no nylon sutures in a vertical mattress fashion securing the conjunctiva to the de-epithelialized cornea to make this wound water secure. Place paracentesis was then used and vitreous balanced salt solution irrigated confirm that there was flow through the the valve. Subconjunctival Decadron applied inferiorly. The wounds are secure. The trocar and all trochars been removed previously. The silk sutures of been previously removed. The conjunctiva was now close. Sterile patch and Fox shield applied the right eye. Patient awake from anesthesia without difficulty taken to the PACU in good condition.Marland Kitchen

## 2015-09-07 NOTE — Progress Notes (Signed)
Requested orders from Dr. Ephriam Knucklesankin's office.

## 2015-09-07 NOTE — Anesthesia Preprocedure Evaluation (Addendum)
Anesthesia Evaluation  Patient identified by MRN, date of birth, ID band Patient awake    Reviewed: Allergy & Precautions, NPO status , Patient's Chart, lab work & pertinent test results  Airway Mallampati: II  TM Distance: >3 FB Neck ROM: Full    Dental no notable dental hx.    Pulmonary former smoker,    Pulmonary exam normal        Cardiovascular hypertension, Normal cardiovascular exam     Neuro/Psych PSYCHIATRIC DISORDERS Anxiety Bipolar Disorder negative neurological ROS     GI/Hepatic Neg liver ROS, GERD  Controlled and Medicated,  Endo/Other  diabetes, Type 2  Renal/GU negative Renal ROS     Musculoskeletal negative musculoskeletal ROS (+)   Abdominal   Peds  Hematology negative hematology ROS (+)   Anesthesia Other Findings   Reproductive/Obstetrics                             Anesthesia Physical Anesthesia Plan  ASA: III  Anesthesia Plan: General   Post-op Pain Management:    Induction: Intravenous  Airway Management Planned: Oral ETT  Additional Equipment:   Intra-op Plan:   Post-operative Plan: Extubation in OR  Informed Consent: I have reviewed the patients History and Physical, chart, labs and discussed the procedure including the risks, benefits and alternatives for the proposed anesthesia with the patient or authorized representative who has indicated his/her understanding and acceptance.   Dental advisory given  Plan Discussed with:   Anesthesia Plan Comments:        Anesthesia Quick Evaluation

## 2015-09-07 NOTE — Brief Op Note (Signed)
Preoperative diagnosis #1 neovascular glaucoma, right eye worsening despite maximal tolerated medical therapy  #2 of diabetic retinopathy with stage I diabetes progressive despite panretinal photocoagulation  #3 vitreous hemorrhage right eye  Postoperative diagnosis Diagnosis same  Procedure #1 posterior vitrectomy with panretinal endolaser photo regulation of the right eye  #2 membrane peel right eye  #3 insertion of aqueous drainage device-Ahmed  valve right eye  #4 scleral patch graft right eye-Tudoplast  Surgeon Edmon CrapeGary a Maureena Dabbs M.D.  Anesthesia Gen.  Complications none

## 2015-09-07 NOTE — Transfer of Care (Signed)
Immediate Anesthesia Transfer of Care Note  Patient: [name] BrimMichelle K Seeley  Procedure(s) Performed: Procedure(s): PARS PLANA VITRECTOMY WITH 25 GAUGE,, with panretinal endolaser, (Right) INSERTION OF AHMED VALVE (Right) LASER PHOTO ABLATION (Right)  Patient Location: PACU  Anesthesia Type:General  Level of Consciousness: awake, oriented, sedated, patient cooperative and responds to stimulation  Airway & Oxygen Therapy: Patient Spontanous Breathing and Patient connected to nasal cannula oxygen  Post-op Assessment: Report given to RN, Post -op Vital signs reviewed and stable, Patient moving all extremities and Patient moving all extremities X 4  Post vital signs: Reviewed and stable  Last Vitals:  Vitals:   09/07/15 1115  BP: (!) 118/56  Pulse: 91  Resp: 18  Temp: 37 C    Last Pain:  Vitals:   09/07/15 1208  TempSrc:   PainSc: 8       Patients Stated Pain Goal: 2 (09/07/15 1208)  Complications: No apparent anesthesia complications
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
## 2015-09-07 NOTE — Anesthesia Procedure Notes (Signed)
Procedure Name: Intubation Date/Time: 09/07/2015 1:38 PM Performed by: [name], [name] Pre-anesthesia Checklist: Patient identified, Emergency Drugs available, Suction available and Patient being monitored Patient Re-evaluated:Patient Re-evaluated prior to inductionOxygen Delivery Method: Circle System Utilized and Circle system utilized Preoxygenation: Pre-oxygenation with 100% oxygen Intubation Type: IV induction and Cricoid Pressure applied Ventilation: Mask ventilation without difficulty Laryngoscope Size: Mac and 4 Grade View: Grade I Tube type: Oral Tube size: 7.5 mm Number of attempts: 1 Airway Equipment and Method: Stylet and Oral airway Placement Confirmation: ETT inserted through vocal cords under direct vision,  positive ETCO2 and breath sounds checked- equal and bilateral Secured at: 22 cm Tube secured with: Tape Dental Injury: Teeth and Oropharynx as per pre-operative assessment

## 2015-09-08 ENCOUNTER — Encounter (HOSPITAL_COMMUNITY): Payer: Self-pay | Admitting: Ophthalmology

## 2015-09-13 NOTE — Anesthesia Postprocedure Evaluation (Signed)
Anesthesia Post Note  Patient: [name] BrimMichelle K Diez  Procedure(s) Performed: Procedure(s) (LRB): PARS PLANA VITRECTOMY WITH 25 GAUGE,, with panretinal endolaser, (Right) INSERTION OF AHMED VALVE (Right) LASER PHOTO ABLATION (Right)  Patient location during evaluation: PACU Anesthesia Type: General Level of consciousness: awake Pain management: pain level controlled Vital Signs Assessment: post-procedure vital signs reviewed and stable Respiratory status: spontaneous breathing Cardiovascular status: stable Postop Assessment: no signs of nausea or vomiting Anesthetic complications: no    Last Vitals:  Vitals:   09/07/15 1630 09/07/15 1637  BP:    Pulse: 77   Resp: 14 15  Temp:  36.7 C    Last Pain:  Vitals:   09/07/15 1637  TempSrc:   PainSc: 6                  Lovelle Lema

## 2016-01-27 ENCOUNTER — Other Ambulatory Visit: Payer: Self-pay | Admitting: Physician Assistant

## 2016-01-28 ENCOUNTER — Other Ambulatory Visit: Payer: Self-pay | Admitting: Physician Assistant

## 2016-01-28 DIAGNOSIS — M545 Low back pain: Secondary | ICD-10-CM

## 2016-02-04 ENCOUNTER — Ambulatory Visit
Admission: RE | Admit: 2016-02-04 | Discharge: 2016-02-04 | Disposition: A | Discharge status: Home / Self Care | Payer: 59 | Source: Ambulatory Visit | Attending: Physician Assistant | Admitting: Physician Assistant

## 2016-02-04 DIAGNOSIS — M545 Low back pain: Secondary | ICD-10-CM

## 2016-02-25 ENCOUNTER — Emergency Department (HOSPITAL_COMMUNITY)
Admission: EM | Admit: 2016-02-25 | Discharge: 2016-02-25 | Disposition: A | Discharge status: Home / Self Care | Payer: 59 | Attending: Emergency Medicine | Admitting: Emergency Medicine

## 2016-02-25 ENCOUNTER — Emergency Department (HOSPITAL_COMMUNITY): Payer: 59

## 2016-02-25 ENCOUNTER — Encounter (HOSPITAL_COMMUNITY): Payer: Self-pay | Admitting: Emergency Medicine

## 2016-02-25 DIAGNOSIS — Z79899 Other long term (current) drug therapy: Secondary | ICD-10-CM | POA: Diagnosis not present

## 2016-02-25 DIAGNOSIS — Z794 Long term (current) use of insulin: Secondary | ICD-10-CM | POA: Insufficient documentation

## 2016-02-25 DIAGNOSIS — M79662 Pain in left lower leg: Secondary | ICD-10-CM | POA: Insufficient documentation

## 2016-02-25 DIAGNOSIS — Z87891 Personal history of nicotine dependence: Secondary | ICD-10-CM | POA: Insufficient documentation

## 2016-02-25 DIAGNOSIS — E1140 Type 2 diabetes mellitus with diabetic neuropathy, unspecified: Secondary | ICD-10-CM | POA: Diagnosis not present

## 2016-02-25 DIAGNOSIS — M79605 Pain in left leg: Secondary | ICD-10-CM

## 2016-02-25 DIAGNOSIS — I10 Essential (primary) hypertension: Secondary | ICD-10-CM | POA: Insufficient documentation

## 2016-02-25 DIAGNOSIS — Z7982 Long term (current) use of aspirin: Secondary | ICD-10-CM | POA: Insufficient documentation

## 2016-02-25 DIAGNOSIS — M549 Dorsalgia, unspecified: Secondary | ICD-10-CM | POA: Diagnosis present

## 2016-02-25 DIAGNOSIS — M5442 Lumbago with sciatica, left side: Secondary | ICD-10-CM | POA: Insufficient documentation

## 2016-02-25 LAB — CBC
HEMATOCRIT: 36.1 % (ref 36.0–46.0)
HEMOGLOBIN: 12.5 g/dL (ref 12.0–15.0)
MCH: 29.4 pg (ref 26.0–34.0)
MCHC: 34.6 g/dL (ref 30.0–36.0)
MCV: 84.9 fL (ref 78.0–100.0)
Platelets: 283 10*3/uL (ref 150–400)
RBC: 4.25 MIL/uL (ref 3.87–5.11)
RDW: 12.7 % (ref 11.5–15.5)
WBC: 9.5 10*3/uL (ref 4.0–10.5)

## 2016-02-25 LAB — BASIC METABOLIC PANEL
Anion gap: 14 (ref 5–15)
BUN: 6 mg/dL (ref 6–20)
CHLORIDE: 101 mmol/L (ref 101–111)
CO2: 23 mmol/L (ref 22–32)
CREATININE: 0.54 mg/dL (ref 0.44–1.00)
Calcium: 8.7 mg/dL — ABNORMAL LOW (ref 8.9–10.3)
GFR calc non Af Amer: >60 mL/min (ref >60)
Glucose, Bld: 247 mg/dL — ABNORMAL HIGH (ref 65–99)
Potassium: 3.9 mmol/L (ref 3.5–5.1)
Sodium: 138 mmol/L (ref 135–145)

## 2016-02-25 MED ORDER — CARISOPRODOL 350 MG PO TABS: 350.0000 mg | ORAL_TABLET | Freq: Three times a day (TID) | ORAL | 1 refills | Status: DC | PRN

## 2016-02-25 MED ORDER — GADOBENATE DIMEGLUMINE 529 MG/ML IV SOLN
20.0000 mL | Freq: Once | INTRAVENOUS | Status: AC | PRN
  Administered 2016-02-25: 20 mL via INTRAVENOUS

## 2016-02-25 MED ORDER — OXYCODONE-ACETAMINOPHEN 10-325 MG PO TABS: 1.0000 | ORAL_TABLET | Freq: Four times a day (QID) | ORAL | 0 refills | Status: AC | PRN

## 2016-02-25 MED ORDER — KETOROLAC TROMETHAMINE 30 MG/ML IJ SOLN
30.0000 mg | Freq: Once | INTRAMUSCULAR | Status: AC
  Administered 2016-02-25: 30 mg via INTRAVENOUS
  Filled 2016-02-25: qty 1

## 2016-02-25 MED ORDER — DEXAMETHASONE SODIUM PHOSPHATE 10 MG/ML IJ SOLN
10.0000 mg | Freq: Once | INTRAMUSCULAR | Status: AC
  Administered 2016-02-25: 10 mg via INTRAVENOUS
  Filled 2016-02-25: qty 1

## 2016-02-25 MED ORDER — ONDANSETRON HCL 4 MG/2ML IJ SOLN
4.0000 mg | Freq: Once | INTRAMUSCULAR | Status: AC
  Administered 2016-02-25: 4 mg via INTRAVENOUS
  Filled 2016-02-25: qty 2

## 2016-02-25 MED ORDER — MORPHINE SULFATE (PF) 4 MG/ML IV SOLN
4.0000 mg | Freq: Once | INTRAVENOUS | Status: AC
  Administered 2016-02-25: 4 mg via INTRAVENOUS
  Filled 2016-02-25: qty 1

## 2016-02-25 MED ORDER — PREDNISONE 10 MG (21) PO TBPK: ORAL_TABLET | ORAL | 0 refills | Status: DC

## 2016-02-25 MED ORDER — SODIUM CHLORIDE 0.9 % IV SOLN
Freq: Once | INTRAVENOUS | Status: AC
  Administered 2016-02-25: 11:00:00 via INTRAVENOUS

## 2016-02-25 NOTE — ED Notes (Signed)
Transported to MRI. Alert and oriented. Pain 6/10

## 2016-02-25 NOTE — Consult Note (Signed)
CC:  Chief Complaint  Patient presents with  . Leg Pain     HPI: [name] is a 46 y.o. female presented to the ER for left sided low back pain with radiation down left leg. On Monday had microdLaurian Brimiscectomy L4/L5. Pain was minimal after surgery, but yesterday pain increased in severity. Pain is 8/10. Sharp, throbbing with shooting pains. Is taking muscle relaxer and percocets without relief. Denies weakness, bowel or bladder dysfunction. Denies issues with the surgical site.  PMH: Past Medical History:  Diagnosis Date  . Anxiety   . Depression   . Diabetes mellitus without complication (HCC)   . High cholesterol   . Hypertension   . Neuropathy (HCC)   . PCOS (polycystic ovarian syndrome)     PSH: Past Surgical History:  Procedure Laterality Date  . ELBOW SURGERY    . INSERTION OF AHMED VALVE Right 09/07/2015   Procedure: INSERTION OF AHMED VALVE;  Surgeon: Edmon CrapeGary A Rankin, MD;  [facility];  Service: Ophthalmology;  Laterality: Right;  . LASER PHOTO ABLATION Right 09/07/2015   Procedure: LASER PHOTO ABLATION;  Surgeon: Edmon CrapeGary A Rankin, MD;  [facility];  Service: Ophthalmology;  Laterality: Right;  . PARS PLANA VITRECTOMY Right 09/07/2015   Procedure: PARS PLANA VITRECTOMY WITH 25 GAUGE,, with panretinal endolaser,;  Surgeon: Edmon CrapeGary A Rankin, MD;  Location: [facility] OR;  Service: Ophthalmology;  Laterality: Right;    SH: Social History  Substance Use Topics  . Smoking status: Former Smoker    Types: Cigarettes    Quit date: 09/12/2000  . Smokeless tobacco: Never Used  . Alcohol use Yes     Comment: occasional    MEDS: Prior to Admission medications   Medication Sig Start Date End Date Taking? Authorizing Provider  acetaminophen (TYLENOL) 650 MG CR tablet Take 650 mg by mouth every 8 (eight) hours as needed for pain.    Historical Provider, MD  aspirin EC 81 MG tablet Take 81 mg by mouth 2 (two) times daily.    Historical Provider, MD  atorvastatin (LIPITOR) 40 MG tablet Take  40 mg by mouth every evening. 07/14/15   Historical Provider, MD  BIOTIN FORTE PO Take 3 tablets by mouth 2 (two) times daily.     Historical Provider, MD  celecoxib (CELEBREX) 200 MG capsule Take 200 mg by mouth every evening.     Historical Provider, MD  diphenoxylate-atropine (LOMOTIL) 2.5-0.025 MG tablet Take 2 tablets by mouth 4 (four) times daily as needed for diarrhea or loose stools. Patient not taking: Reported on 09/07/2015 06/15/15   Elpidio AnisShari Upstill, PA-C  furosemide (LASIX) 20 MG tablet Take 20 mg by mouth every evening.     Historical Provider, MD  gabapentin (NEURONTIN) 800 MG tablet Take 1,600-3,200 mg by mouth See admin instructions. Take 1600 mg by mouth in the morning and take 3200 mg by mouth in the evening.    Historical Provider, MD  ibuprofen (ADVIL,MOTRIN) 200 MG tablet Take 200 mg by mouth every 6 (six) hours as needed for moderate pain.    Historical Provider, MD  insulin aspart (NOVOLOG) 100 UNIT/ML injection Inject 0-20 Units into the skin 3 (three) times daily with meals. Patient not taking: Reported on 08/11/2014 09/16/13   Maryruth Bunhristina P Rama, MD  insulin aspart (NOVOLOG) 100 UNIT/ML injection Inject 6 Units into the skin 3 (three) times daily with meals. Patient not taking: Reported on 11/28/2013 09/16/13   Maryruth Bunhristina P Rama, MD  insulin glargine (LANTUS) 100 UNIT/ML injection Inject  0.27 mLs (27 Units total) into the skin at bedtime. Patient not taking: Reported on 08/11/2014 09/16/13   Maryruth Bun Rama, MD  insulin lispro (HUMALOG) 100 UNIT/ML injection Inject 4-30 Units into the skin 3 (three) times daily as needed for high blood sugar (per sliding scale).     Historical Provider, MD  Loperamide HCl (ANTI-DIARRHEAL PO) Take 1 tablet by mouth as needed (for stomach).    Historical Provider, MD  meclizine (ANTIVERT) 25 MG tablet Take 1 tablet (25 mg total) by mouth 3 (three) times daily as needed for dizziness. 07/27/15   Samuel Jester, DO  metFORMIN (GLUCOPHAGE) 1000 MG tablet Take  1,000 mg by mouth 2 (two) times daily with a meal.    Historical Provider, MD  Multiple Vitamins-Minerals (MULTIVITAMIN GUMMIES ADULT PO) Take 1 capsule by mouth daily.    Historical Provider, MD  ondansetron (ZOFRAN) 4 MG tablet Take 1 tablet (4 mg total) by mouth every 8 (eight) hours as needed for nausea or vomiting. 07/27/15   Samuel Jester, DO  timolol (TIMOPTIC) 0.5 % ophthalmic solution Place 1 drop into the right eye 2 (two) times daily.  06/04/15   Historical Provider, MD  TOUJEO SOLOSTAR 300 UNIT/ML SOPN Inject 90 Units into the skin at bedtime.  06/02/15   Historical Provider, MD  TRULICITY 1.5 MG/0.5ML SOPN Inject 1.5 mg into the skin every Friday. 07/14/15   Historical Provider, MD    ALLERGY: Allergies  Allergen Reactions  . No Known Allergies   . Tramadol     ROS: Review of Systems  Constitutional: Negative for fever.  HENT: Negative.   Eyes: Negative.   Respiratory: Negative.   Cardiovascular: Negative.   Gastrointestinal: Negative.   Musculoskeletal: Positive for back pain and joint pain.  Neurological: Negative.  Negative for weakness.    NEUROLOGIC EXAM: Awake, alert, oriented Memory and concentration grossly intact Speech fluent, appropriate CN grossly intact Motor exam: Upper Extremities Deltoid Bicep Tricep Grip  Right 5/5 5/5 5/5 5/5  Left 5/5 5/5 5/5 5/5   Lower Extremity IP Quad PF DF EHL  Right 5/5 5/5 5/5 5/5 5/5  Left 5/5 5/5 5/5 5/5 5/5   Sensation grossly intact to LT  IMPRESSION: - 46 y.o. female with symptoms of lumbar radiculopathy s/p microdiscectomy Monday. She is neurologically intact. Surgical site looks normal.  PLAN: - MRI L-spine w/wo contrast - Will try to get pain under control in ER with decadron and toradol - If able to, will discharge home on medrol dose pack - call for any questions  Addendum MRI reviewed by Dr Bevely Palmer No abnormal findings Pain has improved. Pt okay for discharge Will adjust her pain medications -  Increase Percocet - Change muscle relaxer to Soma - Start on prednisone dose pack - Medication changes discussed at length with patient - discussed safe and appropriate use - Call for any concerns

## 2016-02-25 NOTE — ED Triage Notes (Addendum)
Pt had baCK SURGERY  On Monday  Disc surgery L 4 L5 , on wed she started to have pain down left leg and yesterday it got worse, she can ambulate with pain, last percocet was at 2 am, she called her dr and was told she could come in to office or come to ER, pt has hx of sciatica, can move and wiggle toes, good  Movement, states she has had 4 surgeries in 4 months
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
## 2016-04-21 ENCOUNTER — Ambulatory Visit (INDEPENDENT_AMBULATORY_CARE_PROVIDER_SITE_OTHER): Payer: 59 | Admitting: Endocrinology

## 2016-04-21 ENCOUNTER — Encounter: Payer: Self-pay | Admitting: Endocrinology

## 2016-04-21 DIAGNOSIS — E1140 Type 2 diabetes mellitus with diabetic neuropathy, unspecified: Secondary | ICD-10-CM | POA: Diagnosis not present

## 2016-04-21 MED ORDER — EMPAGLIFLOZIN 10 MG PO TABS: 10.0000 mg | ORAL_TABLET | Freq: Every day | ORAL | 11 refills | Status: DC

## 2016-04-21 MED ORDER — BASAGLAR KWIKPEN 100 UNIT/ML ~~LOC~~ SOPN: 110.0000 [IU] | PEN_INJECTOR | SUBCUTANEOUS | 11 refills | Status: DC

## 2016-04-21 NOTE — Patient Instructions (Addendum)
good diet and exercise significantly improve the control of your diabetes.  please let me know if you wish to be referred to a dietician.  high blood sugar is very risky to your health.  you should see an eye doctor and dentist every year.  It is very important to get all recommended vaccinations.  Controlling your blood pressure and cholesterol drastically reduces the damage diabetes does to your body.  Those who smoke should quit.  Please discuss these with your doctor.  check your blood sugar 4 times a day: before the 3 meals, and at bedtime.  also check if you have symptoms of your blood sugar being too high or too low.  please keep a record of the readings and bring it to your next appointment here (or you can bring the meter itself).  You can write it on any piece of paper.  please call us sooner if your blood sugar goes below 70, or if you have a lot of readings over 200. I have sent a prescription to your pharmacy, for these diabetes medications: basaglar, 110 units each morning, and: Add jardiance, and: Please continue the same metformin.  Next step will probably be to add the trulicity.  Please see a pain specialist.  you will receive a phone call, about a day and time for an appointment Please come back for a follow-up appointment in 2 weeks.

## 2016-04-21 NOTE — Progress Notes (Signed)
Subjective:    Patient ID: [name], female    DOB: 02-22-1970, 46 y.o.   MRN: [id number]  HPI pt is referred by Dr Selena Batten, for diabetes.  Pt states DM was dx'ed in 1997; she has severe polyneuropathy of the lower extremities, and assoc pain; she also has retinopathy, CAD, and foot ulcer; she has been on insulin since dx; pt says her diet and exercise are poor; she has never had GDM (G0), pancreatitis, pancreatic surgery, or severe hypoglycemia.  She has had 1 episode of DKA (2013).  She says she often goes off the insulin for months at a time, due to bipolar disorder, and to control her weight.  She takes toujeo 90/d, and humalog qac (should be a total of 30/d).  She was rx'ed trulicity, but has not yet taken.  Past Medical History:  Diagnosis Date  . Anxiety   . Depression   . Diabetes mellitus without complication (HCC)   . High cholesterol   . Hypertension   . Neuropathy   . PCOS (polycystic ovarian syndrome)     Past Surgical History:  Procedure Laterality Date  . ELBOW SURGERY    . INSERTION OF AHMED VALVE Right 09/07/2015   Procedure: INSERTION OF AHMED VALVE;  Surgeon: Edmon Crape, MD;  Location: [facility] OR;  Service: Ophthalmology;  Laterality: Right;  . LASER PHOTO ABLATION Right 09/07/2015   Procedure: LASER PHOTO ABLATION;  Surgeon: Edmon Crape, MD;  Location: [facility] OR;  Service: Ophthalmology;  Laterality: Right;  . PARS PLANA VITRECTOMY Right 09/07/2015   Procedure: PARS PLANA VITRECTOMY WITH 25 GAUGE,, with panretinal endolaser,;  Surgeon: Edmon Crape, MD;  Location: [facility] OR;  Service: Ophthalmology;  Laterality: Right;    Social History   Social History  . Marital status: Married    Spouse name: N/A  . Number of children: N/A  . Years of education: N/A   Occupational History  . Not on file.   Social History Main Topics  . Smoking status: Former Smoker    Types: Cigarettes    Quit date: 09/12/2000  . Smokeless tobacco: Never Used  . Alcohol use Yes     Comment:  occasional  . Drug use: Yes    Types: Marijuana  . Sexual activity: Yes    Birth control/ protection: None, Condom   Other Topics Concern  . Not on file   Social History Narrative  . No narrative on file    Current Outpatient Prescriptions on File Prior to Visit  Medication Sig Dispense Refill  . aspirin EC 81 MG tablet Take 81 mg by mouth 2 (two) times daily.    Marland Kitchen atorvastatin (LIPITOR) 40 MG tablet Take 40 mg by mouth every evening.    Marland Kitchen BIOTIN FORTE PO Take 3 tablets by mouth 2 (two) times daily.     . celecoxib (CELEBREX) 200 MG capsule Take 200 mg by mouth every evening.     . furosemide (LASIX) 20 MG tablet Take 20 mg by mouth every evening.     . gabapentin (NEURONTIN) 800 MG tablet Take 1,600-3,200 mg by mouth See admin instructions. Take 1600 mg by mouth in the morning and take 3200 mg by mouth in the evening.    . meclizine (ANTIVERT) 25 MG tablet Take 1 tablet (25 mg total) by mouth 3 (three) times daily as needed for dizziness. 15 tablet 0  . metFORMIN (GLUCOPHAGE) 1000 MG tablet Take 1,000 mg by mouth 2 (two) times daily with  a meal.    . Multiple Vitamins-Minerals (MULTIVITAMIN GUMMIES ADULT PO) Take 1 capsule by mouth daily.    . ondansetron (ZOFRAN) 4 MG tablet Take 1 tablet (4 mg total) by mouth every 8 (eight) hours as needed for nausea or vomiting. 6 tablet 0  . timolol (TIMOPTIC) 0.5 % ophthalmic solution Place 1 drop into the right eye 2 (two) times daily.      No current facility-administered medications on file prior to visit.     Allergies  Allergen Reactions  . No Known Allergies   . Tramadol     Family History  Problem Relation Age of Onset  . Depression Mother   . Anxiety disorder Mother   . Diabetes Mother   . Diabetes Father   . Diabetes Brother     BP 122/82 (BP Location: Left Arm, Patient Position: Sitting, Cuff Size: Normal)   Pulse 86   Temp 98.2 F (36.8 C) (Oral)   Ht  (1.626 m)   Wt 192 lb 8 oz (87.3 kg)   SpO2 97%   BMI  33.04 kg/m    Review of Systems denies weight loss, headache, chest pain, sob, n/v, excessive diaphoresis, and easy bruising.  She has chronic numbness, rhinorrhea, cold intolerance, and pain of the feet.  She has chronically poor vision and difficulty with concentration. She has leg cramps and urinary frequency.    Objective:   Physical Exam VS: see vs page GEN: no distress HEAD: head: no deformity eyes: no periorbital swelling, no proptosis external nose and ears are normal mouth: no lesion seen NECK: supple, thyroid is not enlarged CHEST WALL: no deformity LUNGS: clear to auscultation CV: reg rate and rhythm, no murmur ABD: abdomen is soft, nontender.  no hepatosplenomegaly.  not distended.  no hernia MUSCULOSKELETAL: muscle bulk and strength are grossly normal.  no obvious joint swelling.  gait is steady (she is guided by huaband, due to vision loss).  EXTEMITIES: no deformity.  no ulcer on the feet.  feet are of normal color and temp.  no edema.  Bandaged ulcer at the left great toe (sees wound care) PULSES: dorsalis pedis intact bilat.  no carotid bruit NEURO:  cn 2-12 grossly intact.   readily moves all 4's.  sensation is intact to touch on the feet, but decreased from normal SKIN:  Normal texture and temperature.  No rash or suspicious lesion is visible.   NODES:  None palpable at the neck PSYCH: alert, well-oriented.  Does not appear anxious nor depressed.  outside test results are reviewed: a1c=11.1%  I personally reviewed electrocardiogram tracing (09/07/15): Indication: preop Impression: NSR.  Old and and inf MI's.  No hypertrophy. Compared to 2016: no change.  I have reviewed outside records, and summarized: Pt was noted to have severely elevated a1c, and referred here.  Main problem addressed was painful neuropathy.  Dyslipidemia, GERD, and PCO were also addressed.      Assessment & Plan:  Type 1 DM, with retinopathy: severe exacerbation.   Noncompliance with  insulin.  We discussed risks Bipolar disorder: effective rx of this will help DM rx Obesity: pt says if meds help this, she will be more likely to take. Neuropathic pain, due to DM.   Patient Instructions  good diet and exercise significantly improve the control of your diabetes.  please let me know if you wish to be referred to a dietician.  high blood sugar is very risky to your health.  you should see an eye  doctor and dentist every year.  It is very important to get all recommended vaccinations.  Controlling your blood pressure and cholesterol drastically reduces the damage diabetes does to your body.  Those who smoke should quit.  Please discuss these with your doctor.  check your blood sugar 4 times a day: before the 3 meals, and at bedtime.  also check if you have symptoms of your blood sugar being too high or too low.  please keep a record of the readings and bring it to your next appointment here (or you can bring the meter itself).  You can write it on any piece of paper.  please call us sooner if your blood sugar goes below 70, or if you have a lot of readings over 200. I have sent a prescription to your pharmacy, for these diabetes medications: basaglar, 110 units each morning, and: Add jardiance, and: Please continue the same metformin.  Next step will probably be to add the trulicity.  Please see a pain specialist.  you will receive a phone call, about a day and time for an appointment Please come back for a follow-up appointment in 2 weeks.

## 2016-05-19 ENCOUNTER — Ambulatory Visit (INDEPENDENT_AMBULATORY_CARE_PROVIDER_SITE_OTHER): Payer: 59 | Admitting: Endocrinology

## 2016-05-19 ENCOUNTER — Encounter: Payer: Self-pay | Admitting: Endocrinology

## 2016-05-19 VITALS — BP 120/80 | HR 74 | Wt 191.2 lb

## 2016-05-19 DIAGNOSIS — E119 Type 2 diabetes mellitus without complications: Secondary | ICD-10-CM | POA: Diagnosis not present

## 2016-05-19 LAB — POCT GLYCOSYLATED HEMOGLOBIN (HGB A1C): HEMOGLOBIN A1C: 8.3

## 2016-05-19 MED ORDER — EMPAGLIFLOZIN 10 MG PO TABS: 10.0000 mg | ORAL_TABLET | Freq: Every day | ORAL | 11 refills | Status: DC

## 2016-05-19 MED ORDER — BASAGLAR KWIKPEN 100 UNIT/ML ~~LOC~~ SOPN: 110.0000 [IU] | PEN_INJECTOR | SUBCUTANEOUS | 11 refills | Status: DC

## 2016-05-19 NOTE — Progress Notes (Signed)
Subjective:    Patient ID: [name] BrimMichelle K Rajkumar, female    DOB: [birth year], 46 y.o.   MRN: [id number]  HPI  Pt returns for f/u of diabetes mellitus: DM type: 1 Dx'ed: 1997 Complications: severe painful polyneuropathy, retinopathy, CAD, and foot ulcer Therapy: insulin since dx, and 2 oral meds. GDM: never (G0) DKA: once (2013) Severe hypoglycemia: never. Pancreatitis: never Other: she takes qd insulin, after poor results with multiple daily injections; she often goes off the insulin for months at a time, due to bipolar disorder, and to control her weight  Interval history: He takes basaglar and jardiance as rx'ed.  pt states she feels well in general.  She seldom has hypoglycemia, and these episodes are mild.  There is no trend throughout the day.  Past Medical History:  Diagnosis Date  . Anxiety   . Depression   . Diabetes mellitus without complication (HCC)   . High cholesterol   . Hypertension   . Neuropathy   . PCOS (polycystic ovarian syndrome)     Past Surgical History:  Procedure Laterality Date  . ELBOW SURGERY    . INSERTION OF AHMED VALVE Right 09/07/2015   Procedure: INSERTION OF AHMED VALVE;  Surgeon: Edmon CrapeGary A Rankin, MD;  [facility];  Service: Ophthalmology;  Laterality: Right;  . LASER PHOTO ABLATION Right 09/07/2015   Procedure: LASER PHOTO ABLATION;  Surgeon: Edmon CrapeGary A Rankin, MD;  [facility];  Service: Ophthalmology;  Laterality: Right;  . PARS PLANA VITRECTOMY Right 09/07/2015   Procedure: PARS PLANA VITRECTOMY WITH 25 GAUGE,, with panretinal endolaser,;  Surgeon: Edmon CrapeGary A Rankin, MD;  Location: [facility] OR;  Service: Ophthalmology;  Laterality: Right;    Social History   Social History  . Marital status: Married    Spouse name: N/A  . Number of children: N/A  . Years of education: N/A   Occupational History  . Not on file.   Social History Main Topics  . Smoking status: Former Smoker    Types: Cigarettes    Quit date: 09/12/2000  . Smokeless tobacco: Never  Used  . Alcohol use Yes     Comment: occasional  . Drug use: Yes    Types: Marijuana  . Sexual activity: Yes    Birth control/ protection: None, Condom   Other Topics Concern  . Not on file   Social History Narrative  . No narrative on file    Current Outpatient Prescriptions on File Prior to Visit  Medication Sig Dispense Refill  . aspirin EC 81 MG tablet Take 81 mg by mouth 2 (two) times daily.    Marland Kitchen. atorvastatin (LIPITOR) 40 MG tablet Take 40 mg by mouth every evening.    Marland Kitchen. BIOTIN FORTE PO Take 3 tablets by mouth 2 (two) times daily.     . celecoxib (CELEBREX) 200 MG capsule Take 200 mg by mouth every evening.     . furosemide (LASIX) 20 MG tablet Take 20 mg by mouth every evening.     . gabapentin (NEURONTIN) 800 MG tablet Take 1,600-3,200 mg by mouth See admin instructions. Take 1600 mg by mouth in the morning and take 3200 mg by mouth in the evening.    Marland Kitchen. glucose blood (ONE TOUCH ULTRA TEST) test strip Use as directed once daily to check blood sugar DX E11.9    . meclizine (ANTIVERT) 25 MG tablet Take 1 tablet (25 mg total) by mouth 3 (three) times daily as needed for dizziness. 15 tablet 0  . metFORMIN (  GLUCOPHAGE) 1000 MG tablet Take 1,000 mg by mouth 2 (two) times daily with a meal.    . Multiple Vitamins-Minerals (MULTIVITAMIN GUMMIES ADULT PO) Take 1 capsule by mouth daily.    . ondansetron (ZOFRAN) 4 MG tablet Take 1 tablet (4 mg total) by mouth every 8 (eight) hours as needed for nausea or vomiting. 6 tablet 0  . timolol (TIMOPTIC) 0.5 % ophthalmic solution Place 1 drop into the right eye 2 (two) times daily.      No current facility-administered medications on file prior to visit.     Allergies  Allergen Reactions  . No Known Allergies   . Tramadol     Family History  Problem Relation Age of Onset  . Depression Mother   . Anxiety disorder Mother   . Diabetes Mother   . Diabetes Father   . Diabetes Brother     BP 120/80 (BP Location: Left Arm, Patient  Position: Sitting)   Pulse 74   Wt 191 lb 3.2 oz (86.7 kg)   SpO2 98%   BMI 32.82 kg/m    Review of Systems She denies LOC    Objective:   Physical Exam VITAL SIGNS:  See vs page GENERAL: no distress Pulses: dorsalis pedis intact bilat.   MSK: no deformity of the feet CV: no leg edema Skin:  normal color and temp on the feet. Both great toes are bandaged (sees wound care) Neuro: sensation is intact to touch on the feet, but severely decreased from normal  Lab Results  Component Value Date   HGBA1C 8.3 05/19/2016   Lab Results  Component Value Date   CREATININE 0.54 02/25/2016   BUN 6 02/25/2016   NA 138 02/25/2016   K 3.9 02/25/2016   CL 101 02/25/2016   CO2 23 02/25/2016      Assessment & Plan:  Type 1 DM: glycemic control is improved. This a1c does not yet fully reflect the improvement CAD: in this context, she should avoid hypoglycemia, so we won't increase rx now.   Patient Instructions  check your blood sugar 4 times a day: before the 3 meals, and at bedtime.  also check if you have symptoms of your blood sugar being too high or too low.  please keep a record of the readings and bring it to your next appointment here (or you can bring the meter itself).  You can write it on any piece of paper.  please call us sooner if your blood sugar goes below 70, or if you have a lot of readings over 200. Please continue the same medications. Please come back for a follow-up appointment in 2 months.

## 2016-05-19 NOTE — Patient Instructions (Addendum)
check your blood sugar 4 times a day: before the 3 meals, and at bedtime.  also check if you have symptoms of your blood sugar being too high or too low.  please keep a record of the readings and bring it to your next appointment here (or you can bring the meter itself).  You can write it on any piece of paper.  please call us sooner if your blood sugar goes below 70, or if you have a lot of readings over 200.    Please continue the same medications.  Please come back for a follow-up appointment in 2 months.    

## 2016-05-26 ENCOUNTER — Encounter (HOSPITAL_BASED_OUTPATIENT_CLINIC_OR_DEPARTMENT_OTHER): Payer: 59 | Attending: Internal Medicine

## 2016-05-26 ENCOUNTER — Other Ambulatory Visit: Payer: Self-pay | Admitting: Internal Medicine

## 2016-05-26 ENCOUNTER — Ambulatory Visit (HOSPITAL_COMMUNITY)
Admission: RE | Admit: 2016-05-26 | Discharge: 2016-05-26 | Disposition: A | Discharge status: Home / Self Care | Payer: 59 | Source: Ambulatory Visit | Attending: Internal Medicine | Admitting: Internal Medicine

## 2016-05-26 DIAGNOSIS — L97512 Non-pressure chronic ulcer of other part of right foot with fat layer exposed: Secondary | ICD-10-CM | POA: Diagnosis not present

## 2016-05-26 DIAGNOSIS — L97522 Non-pressure chronic ulcer of other part of left foot with fat layer exposed: Secondary | ICD-10-CM | POA: Insufficient documentation

## 2016-05-26 DIAGNOSIS — E1169 Type 2 diabetes mellitus with other specified complication: Secondary | ICD-10-CM | POA: Insufficient documentation

## 2016-05-26 DIAGNOSIS — Z87891 Personal history of nicotine dependence: Secondary | ICD-10-CM | POA: Diagnosis not present

## 2016-05-26 DIAGNOSIS — E10621 Type 1 diabetes mellitus with foot ulcer: Secondary | ICD-10-CM | POA: Insufficient documentation

## 2016-05-26 DIAGNOSIS — E10319 Type 1 diabetes mellitus with unspecified diabetic retinopathy without macular edema: Secondary | ICD-10-CM | POA: Insufficient documentation

## 2016-05-26 DIAGNOSIS — M869 Osteomyelitis, unspecified: Secondary | ICD-10-CM

## 2016-05-26 DIAGNOSIS — I739 Peripheral vascular disease, unspecified: Secondary | ICD-10-CM | POA: Insufficient documentation

## 2016-05-26 DIAGNOSIS — M7989 Other specified soft tissue disorders: Secondary | ICD-10-CM | POA: Diagnosis not present

## 2016-05-26 DIAGNOSIS — E1040 Type 1 diabetes mellitus with diabetic neuropathy, unspecified: Secondary | ICD-10-CM | POA: Insufficient documentation

## 2016-06-02 ENCOUNTER — Encounter (HOSPITAL_BASED_OUTPATIENT_CLINIC_OR_DEPARTMENT_OTHER): Payer: 59 | Attending: Internal Medicine

## 2016-06-02 DIAGNOSIS — E10621 Type 1 diabetes mellitus with foot ulcer: Secondary | ICD-10-CM | POA: Insufficient documentation

## 2016-06-02 DIAGNOSIS — E1042 Type 1 diabetes mellitus with diabetic polyneuropathy: Secondary | ICD-10-CM | POA: Diagnosis not present

## 2016-06-02 DIAGNOSIS — I10 Essential (primary) hypertension: Secondary | ICD-10-CM | POA: Diagnosis not present

## 2016-06-02 DIAGNOSIS — L03116 Cellulitis of left lower limb: Secondary | ICD-10-CM | POA: Diagnosis not present

## 2016-06-02 DIAGNOSIS — L97512 Non-pressure chronic ulcer of other part of right foot with fat layer exposed: Secondary | ICD-10-CM | POA: Diagnosis not present

## 2016-06-02 DIAGNOSIS — L84 Corns and callosities: Secondary | ICD-10-CM | POA: Insufficient documentation

## 2016-06-02 DIAGNOSIS — L97522 Non-pressure chronic ulcer of other part of left foot with fat layer exposed: Secondary | ICD-10-CM | POA: Diagnosis not present

## 2016-06-06 ENCOUNTER — Other Ambulatory Visit: Payer: Self-pay | Admitting: Internal Medicine

## 2016-06-06 ENCOUNTER — Telehealth: Payer: Self-pay | Admitting: Endocrinology

## 2016-06-06 DIAGNOSIS — L97523 Non-pressure chronic ulcer of other part of left foot with necrosis of muscle: Secondary | ICD-10-CM

## 2016-06-06 NOTE — Telephone Encounter (Signed)
Patient called to inquire about referral to Pain Management. I advised the patient that the referral was denied due to internal guidelines not met however, I am not aware of what this reason means. Please call patient today to advise on if there is an alternative and to clarify the denial. Patient is awaiting the call for today.

## 2016-06-07 NOTE — Telephone Encounter (Signed)
Referral to pain management was denied please advise

## 2016-06-08 NOTE — Telephone Encounter (Signed)
[facility]  Beyond that, nothing I can do.

## 2016-06-08 NOTE — Telephone Encounter (Signed)
Any idea on how to proceed?  Thanks!

## 2016-06-09 DIAGNOSIS — E10621 Type 1 diabetes mellitus with foot ulcer: Secondary | ICD-10-CM | POA: Diagnosis not present

## 2016-06-10 ENCOUNTER — Ambulatory Visit (HOSPITAL_COMMUNITY)
Admission: RE | Admit: 2016-06-10 | Discharge: 2016-06-10 | Disposition: A | Discharge status: Home / Self Care | Payer: 59 | Source: Ambulatory Visit | Attending: Internal Medicine | Admitting: Internal Medicine

## 2016-06-10 ENCOUNTER — Other Ambulatory Visit (HOSPITAL_COMMUNITY): Payer: Self-pay | Admitting: *Deleted

## 2016-06-10 DIAGNOSIS — L97523 Non-pressure chronic ulcer of other part of left foot with necrosis of muscle: Secondary | ICD-10-CM | POA: Insufficient documentation

## 2016-06-10 DIAGNOSIS — R600 Localized edema: Secondary | ICD-10-CM | POA: Diagnosis not present

## 2016-06-10 LAB — POCT I-STAT CREATININE: CREATININE: 0.7 mg/dL (ref 0.44–1.00)

## 2016-06-10 MED ORDER — GADOBENATE DIMEGLUMINE 529 MG/ML IV SOLN
20.0000 mL | Freq: Once | INTRAVENOUS | Status: AC | PRN
Start: 2016-06-10 — End: 2016-06-10
  Administered 2016-06-10: 18 mL via INTRAVENOUS

## 2016-06-15 DIAGNOSIS — E10621 Type 1 diabetes mellitus with foot ulcer: Secondary | ICD-10-CM | POA: Diagnosis not present

## 2016-06-19 NOTE — Telephone Encounter (Signed)
Referral was worked back in April by Gavin Poundeborah, I don't have any of the information, forwarding this note to Gavin PoundDeborah, thanks!

## 2016-06-19 NOTE — Telephone Encounter (Signed)
Patient called back to check status. Advised not worked at this point. [facility]. Advised patient 4-6 weeks for response from almost any pain management xclinic

## 2016-06-19 NOTE — Telephone Encounter (Signed)
Please see messages.

## 2016-06-22 DIAGNOSIS — E10621 Type 1 diabetes mellitus with foot ulcer: Secondary | ICD-10-CM | POA: Diagnosis not present

## 2016-06-30 DIAGNOSIS — E10621 Type 1 diabetes mellitus with foot ulcer: Secondary | ICD-10-CM | POA: Diagnosis not present

## 2016-07-07 ENCOUNTER — Encounter (HOSPITAL_BASED_OUTPATIENT_CLINIC_OR_DEPARTMENT_OTHER): Payer: 59 | Attending: Internal Medicine

## 2016-07-07 ENCOUNTER — Telehealth: Payer: Self-pay | Admitting: Endocrinology

## 2016-07-07 DIAGNOSIS — B9689 Other specified bacterial agents as the cause of diseases classified elsewhere: Secondary | ICD-10-CM | POA: Insufficient documentation

## 2016-07-07 DIAGNOSIS — I10 Essential (primary) hypertension: Secondary | ICD-10-CM | POA: Insufficient documentation

## 2016-07-07 DIAGNOSIS — E1042 Type 1 diabetes mellitus with diabetic polyneuropathy: Secondary | ICD-10-CM | POA: Insufficient documentation

## 2016-07-07 DIAGNOSIS — E10621 Type 1 diabetes mellitus with foot ulcer: Secondary | ICD-10-CM | POA: Diagnosis not present

## 2016-07-07 DIAGNOSIS — L97522 Non-pressure chronic ulcer of other part of left foot with fat layer exposed: Secondary | ICD-10-CM | POA: Insufficient documentation

## 2016-07-07 DIAGNOSIS — L84 Corns and callosities: Secondary | ICD-10-CM | POA: Diagnosis not present

## 2016-07-07 MED ORDER — GLUCOSE BLOOD VI STRP: ORAL_STRIP | 2 refills | Status: DC

## 2016-07-07 NOTE — Telephone Encounter (Signed)
Rx submitted and patient notified.

## 2016-07-07 NOTE — Telephone Encounter (Signed)
**  Remind patient they can make refill requests via MyChart**  Medication refill request (Name & Dosage):  glucose blood (ONE TOUCH ULTRA TEST) test strip  Preferred pharmacy (Name & Address):  [facility] [address], [place] - [address]  Notify patient once rx has been placed.

## 2016-07-13 ENCOUNTER — Other Ambulatory Visit (HOSPITAL_COMMUNITY)
Admission: RE | Admit: 2016-07-13 | Discharge: 2016-07-13 | Disposition: A | Discharge status: Home / Self Care | Payer: 59 | Source: Other Acute Inpatient Hospital | Attending: Internal Medicine | Admitting: Internal Medicine

## 2016-07-13 DIAGNOSIS — E11621 Type 2 diabetes mellitus with foot ulcer: Secondary | ICD-10-CM | POA: Insufficient documentation

## 2016-07-13 DIAGNOSIS — L97509 Non-pressure chronic ulcer of other part of unspecified foot with unspecified severity: Secondary | ICD-10-CM | POA: Insufficient documentation

## 2016-07-13 DIAGNOSIS — E10621 Type 1 diabetes mellitus with foot ulcer: Secondary | ICD-10-CM | POA: Diagnosis not present

## 2016-07-16 LAB — AEROBIC CULTURE  (SUPERFICIAL SPECIMEN)

## 2016-07-16 LAB — AEROBIC CULTURE W GRAM STAIN (SUPERFICIAL SPECIMEN)

## 2016-07-21 ENCOUNTER — Ambulatory Visit: Payer: Self-pay | Admitting: Endocrinology

## 2016-07-21 DIAGNOSIS — E10621 Type 1 diabetes mellitus with foot ulcer: Secondary | ICD-10-CM | POA: Diagnosis not present

## 2016-07-24 ENCOUNTER — Other Ambulatory Visit (HOSPITAL_COMMUNITY)
Admission: RE | Admit: 2016-07-24 | Discharge: 2016-07-24 | Disposition: A | Discharge status: Home / Self Care | Payer: 59 | Source: Other Acute Inpatient Hospital | Attending: Internal Medicine | Admitting: Internal Medicine

## 2016-07-24 DIAGNOSIS — E10621 Type 1 diabetes mellitus with foot ulcer: Secondary | ICD-10-CM | POA: Diagnosis present

## 2016-07-27 LAB — AEROBIC CULTURE  (SUPERFICIAL SPECIMEN): CULTURE: NO GROWTH

## 2016-07-27 LAB — AEROBIC CULTURE W GRAM STAIN (SUPERFICIAL SPECIMEN)

## 2016-07-28 DIAGNOSIS — E10621 Type 1 diabetes mellitus with foot ulcer: Secondary | ICD-10-CM | POA: Diagnosis not present

## 2016-08-04 ENCOUNTER — Encounter (HOSPITAL_BASED_OUTPATIENT_CLINIC_OR_DEPARTMENT_OTHER): Payer: 59 | Attending: Internal Medicine

## 2016-08-04 DIAGNOSIS — E10621 Type 1 diabetes mellitus with foot ulcer: Secondary | ICD-10-CM | POA: Diagnosis present

## 2016-08-04 DIAGNOSIS — E1042 Type 1 diabetes mellitus with diabetic polyneuropathy: Secondary | ICD-10-CM | POA: Diagnosis not present

## 2016-08-04 DIAGNOSIS — I10 Essential (primary) hypertension: Secondary | ICD-10-CM | POA: Diagnosis not present

## 2016-08-04 DIAGNOSIS — L97522 Non-pressure chronic ulcer of other part of left foot with fat layer exposed: Secondary | ICD-10-CM | POA: Insufficient documentation

## 2016-08-18 DIAGNOSIS — E10621 Type 1 diabetes mellitus with foot ulcer: Secondary | ICD-10-CM | POA: Diagnosis not present

## 2016-08-23 ENCOUNTER — Ambulatory Visit (INDEPENDENT_AMBULATORY_CARE_PROVIDER_SITE_OTHER): Payer: Managed Care, Other (non HMO) | Admitting: Podiatry

## 2016-08-23 ENCOUNTER — Encounter: Payer: Self-pay | Admitting: Podiatry

## 2016-08-23 ENCOUNTER — Other Ambulatory Visit: Payer: Self-pay

## 2016-08-23 DIAGNOSIS — E0842 Diabetes mellitus due to underlying condition with diabetic polyneuropathy: Secondary | ICD-10-CM

## 2016-08-23 DIAGNOSIS — L97522 Non-pressure chronic ulcer of other part of left foot with fat layer exposed: Secondary | ICD-10-CM | POA: Diagnosis not present

## 2016-08-23 NOTE — Progress Notes (Signed)
   HPI: A 46 year old female with a history of uncontrolled diabetes mellitus and retinopathy presents today for diabetic foot evaluation. Patient has a history of diabetes mellitus and recurrent ulcerations to the bilateral great toes. These ulcers heel and reopen on occasion. Patient currently has an ulcer on the left great toenail is being managed by Dr. Leanord Hawking, at the [facility] wound care center. Patient is requesting diabetic shoes with diabetic insoles    Physical Exam: General: The patient is alert and oriented x3 in no acute distress.  Dermatology: Skin is warm, dry and supple bilateral lower extremities. Ulceration noted to the plantar aspect of the left great toe measuring approximately [phone] cm. Wound is stable and negative for drainage. Wound base is granular. No sign of infectious process noted. Periwound integrity intact.  Vascular: Palpable pedal pulses bilaterally. No edema or erythema noted. Capillary refill within normal limits.  Neurological: Epicritic and protective threshold absent bilaterally.   Musculoskeletal Exam: Range of motion within normal limits to all pedal and ankle joints bilateral. Muscle strength 5/5 in all groups bilateral.   Assessment: 1. Diabetes mellitus complicated by polyneuropathy 2. Retinopathy and lost vision 3. Ulcer left great toe secondary to diabetes mellitus  Plan of Care:  1. Patient was evaluated. 2. Today we are going to arrange an appointment with our Pedorthist for custom molded Plastizote insoles and diabetic shoes 3. Continue wound care to the left great toe ulceration with Dr. Leanord Hawking  4. Return to clinic annually for routine foot exam   Felecia Shelling, DPM [facility]  Dr. Felecia Shelling, DPM    [address] [place], [place] [address]                Office [phone]  Fax [phone]

## 2016-08-25 DIAGNOSIS — E10621 Type 1 diabetes mellitus with foot ulcer: Secondary | ICD-10-CM | POA: Diagnosis not present

## 2016-09-01 DIAGNOSIS — E10621 Type 1 diabetes mellitus with foot ulcer: Secondary | ICD-10-CM | POA: Diagnosis not present

## 2016-09-05 ENCOUNTER — Telehealth: Payer: Self-pay | Admitting: Podiatry

## 2016-09-05 NOTE — Telephone Encounter (Signed)
Ummm... Not sure what to do in this case. Let her know she needs to contact [employer] and find a podiatrist that orders through them.  Thanks, Dr. [name] BoresEvans

## 2016-09-05 NOTE — Telephone Encounter (Signed)
I called patients insurance company to check benefits for diabetic shoes and they cover it at 70% after deductible but they have to be ordered thru a company called [employer]. If ordered from another supplier they are not covered.We do not order from that company. I have talked with patient and canceled appointment. What did you want to do.

## 2016-09-06 ENCOUNTER — Other Ambulatory Visit: Payer: Managed Care, Other (non HMO)

## 2016-09-08 ENCOUNTER — Encounter (HOSPITAL_BASED_OUTPATIENT_CLINIC_OR_DEPARTMENT_OTHER): Payer: 59 | Attending: Internal Medicine

## 2016-09-08 DIAGNOSIS — E1042 Type 1 diabetes mellitus with diabetic polyneuropathy: Secondary | ICD-10-CM | POA: Insufficient documentation

## 2016-09-08 DIAGNOSIS — Z09 Encounter for follow-up examination after completed treatment for conditions other than malignant neoplasm: Secondary | ICD-10-CM | POA: Insufficient documentation

## 2016-09-08 DIAGNOSIS — I10 Essential (primary) hypertension: Secondary | ICD-10-CM | POA: Insufficient documentation

## 2016-09-08 DIAGNOSIS — Z8631 Personal history of diabetic foot ulcer: Secondary | ICD-10-CM | POA: Diagnosis not present

## 2016-09-18 ENCOUNTER — Ambulatory Visit: Payer: Managed Care, Other (non HMO) | Admitting: Podiatry

## 2016-09-25 ENCOUNTER — Ambulatory Visit: Payer: Managed Care, Other (non HMO) | Admitting: Orthotics

## 2016-09-28 ENCOUNTER — Ambulatory Visit (INDEPENDENT_AMBULATORY_CARE_PROVIDER_SITE_OTHER): Payer: Managed Care, Other (non HMO) | Admitting: Orthotics

## 2016-09-28 DIAGNOSIS — L97522 Non-pressure chronic ulcer of other part of left foot with fat layer exposed: Secondary | ICD-10-CM

## 2016-09-28 DIAGNOSIS — E0842 Diabetes mellitus due to underlying condition with diabetic polyneuropathy: Secondary | ICD-10-CM

## 2016-09-28 NOTE — Progress Notes (Signed)
Patient presents today for diabetic shoe measurement and foam casting.  Goals of diabetic shoes/inserts to offer protection from conditions secondary to DM2, offer relief from sheer forces that could lead to ulcerations, protect the foot, and offer greater stability. Patient is under supervision of DPM Evans Physician managing patients DM2: Romero Belling Patient has following documented conditions to qualify for diabetic shoes/inserts: DM2, PN, callus, ulcer Patient measured with brannock device: 9.5   Chose [id number]

## 2016-10-03 ENCOUNTER — Encounter (HOSPITAL_BASED_OUTPATIENT_CLINIC_OR_DEPARTMENT_OTHER): Payer: 59 | Attending: Surgery

## 2016-10-03 DIAGNOSIS — E1040 Type 1 diabetes mellitus with diabetic neuropathy, unspecified: Secondary | ICD-10-CM | POA: Diagnosis not present

## 2016-10-03 DIAGNOSIS — I10 Essential (primary) hypertension: Secondary | ICD-10-CM | POA: Insufficient documentation

## 2016-10-03 DIAGNOSIS — L97522 Non-pressure chronic ulcer of other part of left foot with fat layer exposed: Secondary | ICD-10-CM | POA: Diagnosis not present

## 2016-10-03 DIAGNOSIS — E1042 Type 1 diabetes mellitus with diabetic polyneuropathy: Secondary | ICD-10-CM | POA: Insufficient documentation

## 2016-10-03 DIAGNOSIS — L84 Corns and callosities: Secondary | ICD-10-CM | POA: Diagnosis not present

## 2016-10-03 DIAGNOSIS — E10621 Type 1 diabetes mellitus with foot ulcer: Secondary | ICD-10-CM | POA: Insufficient documentation

## 2016-10-03 DIAGNOSIS — L97519 Non-pressure chronic ulcer of other part of right foot with unspecified severity: Secondary | ICD-10-CM | POA: Diagnosis not present

## 2016-10-12 DIAGNOSIS — E10621 Type 1 diabetes mellitus with foot ulcer: Secondary | ICD-10-CM | POA: Diagnosis not present

## 2016-10-26 ENCOUNTER — Ambulatory Visit: Payer: Managed Care, Other (non HMO) | Admitting: Orthotics

## 2016-10-27 DIAGNOSIS — E10621 Type 1 diabetes mellitus with foot ulcer: Secondary | ICD-10-CM | POA: Diagnosis not present

## 2016-11-08 ENCOUNTER — Ambulatory Visit (INDEPENDENT_AMBULATORY_CARE_PROVIDER_SITE_OTHER): Payer: Managed Care, Other (non HMO) | Admitting: Orthotics

## 2016-11-08 DIAGNOSIS — L97522 Non-pressure chronic ulcer of other part of left foot with fat layer exposed: Secondary | ICD-10-CM

## 2016-11-08 DIAGNOSIS — E0842 Diabetes mellitus due to underlying condition with diabetic polyneuropathy: Secondary | ICD-10-CM

## 2016-11-12 NOTE — Progress Notes (Signed)

## 2016-12-12 ENCOUNTER — Ambulatory Visit: Payer: Self-pay | Admitting: Endocrinology

## 2017-02-13 ENCOUNTER — Telehealth: Payer: Self-pay | Admitting: Podiatry

## 2017-02-13 NOTE — Telephone Encounter (Signed)
I told pt that I felt she would benefit from seeing Dr. [name] BoresEvans to make sure the wounds were not worsening and advise our pedorthist she should be able to see the same day. Pt agreed and is transferred to schedulers.

## 2017-02-13 NOTE — Telephone Encounter (Signed)
I'm a pt of Dr. Evans and around the weLogan [name] before Thanksgiving I got orthotics and diabetic shoes for pressure ulcers under my big toes which had healed up. It looks like they are starting to come back which means my toe is not being off loaded. I need to know if I need to come see Dr. [name] BoresEvans or do I need to go see a different kind of doctor. If you could please call me back at [phone]. Thank you.

## 2017-02-19 ENCOUNTER — Encounter: Payer: Self-pay | Admitting: Podiatry

## 2017-02-19 ENCOUNTER — Ambulatory Visit: Payer: Managed Care, Other (non HMO) | Admitting: Podiatry

## 2017-02-19 DIAGNOSIS — E0842 Diabetes mellitus due to underlying condition with diabetic polyneuropathy: Secondary | ICD-10-CM | POA: Diagnosis not present

## 2017-02-19 DIAGNOSIS — L989 Disorder of the skin and subcutaneous tissue, unspecified: Secondary | ICD-10-CM

## 2017-02-21 NOTE — Progress Notes (Signed)
   HPI: A 47 year old female with a history of uncontrolled diabetes mellitus and retinopathy presents today with a chief complaint of lesions to bilateral great toes. She states she has been wearing her orthotics but they are uncomfortable. She states they are too tight and causing erythema to the dorsum of the feet. Patient is here for further evaluation and treatment.   Past Medical History:  Diagnosis Date  . Anxiety   . Depression   . Diabetes mellitus without complication (HCC)   . High cholesterol   . Hypertension   . Neuropathy   . PCOS (polycystic ovarian syndrome)      Physical Exam: General: The patient is alert and oriented x3 in no acute distress.  Dermatology: Hyperkeratotic lesions present on the bilateral great toes. Pain on palpation with a central nucleated core noted.  Skin is warm, dry and supple bilateral lower extremities. Negative for open lesions or macerations.  Vascular: Palpable pedal pulses bilaterally. No edema or erythema noted. Capillary refill within normal limits.  Neurological: Epicritic and protective threshold absent bilaterally.   Musculoskeletal Exam: Range of motion within normal limits to all pedal and ankle joints bilateral. Muscle strength 5/5 in all groups bilateral.   Assessment: 1. Diabetes mellitus complicated by polyneuropathy 2. Retinopathy and loss of vision 3. Pre-ulcerative callus lesions bilateral great toes  Plan of Care:  1. Patient was evaluated. 2. Excisional debridement of keratoic lesion using a chisel blade was performed without incident. Dressed with light dressing. 3. Appointment with Fenton FoyBetha for DM shoes and insole modification.  4. Return to clinic as needed.   Felecia ShellingBrent M. Evans, DPM [facility]  Dr. Felecia ShellingBrent M. Evans, DPM    [address]                                        [place], [place] [address]                Office [phone]  Fax [phone]

## 2017-02-27 ENCOUNTER — Other Ambulatory Visit: Payer: Managed Care, Other (non HMO)

## 2017-05-12 ENCOUNTER — Other Ambulatory Visit: Payer: Self-pay

## 2017-05-12 ENCOUNTER — Inpatient Hospital Stay (HOSPITAL_COMMUNITY)
Admission: EM | Admit: 2017-05-12 | Discharge: 2017-05-13 | DRG: 682 | Disposition: A | Discharge status: Home / Self Care | Payer: 59 | Attending: Internal Medicine | Admitting: Internal Medicine

## 2017-05-12 ENCOUNTER — Encounter (HOSPITAL_COMMUNITY): Payer: Self-pay | Admitting: *Deleted

## 2017-05-12 DIAGNOSIS — E282 Polycystic ovarian syndrome: Secondary | ICD-10-CM | POA: Diagnosis present

## 2017-05-12 DIAGNOSIS — E1110 Type 2 diabetes mellitus with ketoacidosis without coma: Secondary | ICD-10-CM | POA: Diagnosis present

## 2017-05-12 DIAGNOSIS — Z833 Family history of diabetes mellitus: Secondary | ICD-10-CM

## 2017-05-12 DIAGNOSIS — F329 Major depressive disorder, single episode, unspecified: Secondary | ICD-10-CM | POA: Diagnosis present

## 2017-05-12 DIAGNOSIS — E860 Dehydration: Secondary | ICD-10-CM | POA: Diagnosis not present

## 2017-05-12 DIAGNOSIS — M549 Dorsalgia, unspecified: Secondary | ICD-10-CM | POA: Diagnosis present

## 2017-05-12 DIAGNOSIS — E11319 Type 2 diabetes mellitus with unspecified diabetic retinopathy without macular edema: Secondary | ICD-10-CM | POA: Diagnosis present

## 2017-05-12 DIAGNOSIS — I10 Essential (primary) hypertension: Secondary | ICD-10-CM | POA: Diagnosis present

## 2017-05-12 DIAGNOSIS — Z87891 Personal history of nicotine dependence: Secondary | ICD-10-CM

## 2017-05-12 DIAGNOSIS — E785 Hyperlipidemia, unspecified: Secondary | ICD-10-CM | POA: Diagnosis present

## 2017-05-12 DIAGNOSIS — Z79899 Other long term (current) drug therapy: Secondary | ICD-10-CM | POA: Diagnosis not present

## 2017-05-12 DIAGNOSIS — E7800 Pure hypercholesterolemia, unspecified: Secondary | ICD-10-CM | POA: Diagnosis present

## 2017-05-12 DIAGNOSIS — F419 Anxiety disorder, unspecified: Secondary | ICD-10-CM | POA: Diagnosis present

## 2017-05-12 DIAGNOSIS — G5790 Unspecified mononeuropathy of unspecified lower limb: Secondary | ICD-10-CM | POA: Diagnosis present

## 2017-05-12 DIAGNOSIS — E1141 Type 2 diabetes mellitus with diabetic mononeuropathy: Secondary | ICD-10-CM | POA: Diagnosis present

## 2017-05-12 DIAGNOSIS — Z794 Long term (current) use of insulin: Secondary | ICD-10-CM

## 2017-05-12 DIAGNOSIS — Z7982 Long term (current) use of aspirin: Secondary | ICD-10-CM

## 2017-05-12 DIAGNOSIS — R112 Nausea with vomiting, unspecified: Secondary | ICD-10-CM | POA: Diagnosis present

## 2017-05-12 DIAGNOSIS — E872 Acidosis: Secondary | ICD-10-CM

## 2017-05-12 DIAGNOSIS — G8929 Other chronic pain: Secondary | ICD-10-CM | POA: Diagnosis present

## 2017-05-12 DIAGNOSIS — G629 Polyneuropathy, unspecified: Secondary | ICD-10-CM | POA: Diagnosis not present

## 2017-05-12 DIAGNOSIS — N179 Acute kidney failure, unspecified: Principal | ICD-10-CM

## 2017-05-12 DIAGNOSIS — E8729 Other acidosis: Secondary | ICD-10-CM

## 2017-05-12 LAB — BASIC METABOLIC PANEL WITH GFR
Anion gap: 19 — ABNORMAL HIGH (ref 5–15)
BUN: 16 mg/dL (ref 6–20)
CO2: 12 mmol/L — ABNORMAL LOW (ref 22–32)
Calcium: 8.7 mg/dL — ABNORMAL LOW (ref 8.9–10.3)
Chloride: 106 mmol/L (ref 101–111)
Creatinine, Ser: 1.18 mg/dL — ABNORMAL HIGH (ref 0.44–1.00)
GFR calc Af Amer: >60 mL/min
GFR calc non Af Amer: 55 mL/min — ABNORMAL LOW
Glucose, Bld: 195 mg/dL — ABNORMAL HIGH (ref 65–99)
Potassium: 4.9 mmol/L (ref 3.5–5.1)
Sodium: 137 mmol/L (ref 135–145)

## 2017-05-12 LAB — URINALYSIS, ROUTINE W REFLEX MICROSCOPIC
Bilirubin Urine: NEGATIVE
Glucose, UA: NEGATIVE mg/dL
Hgb urine dipstick: NEGATIVE
Ketones, ur: NEGATIVE mg/dL
Leukocytes, UA: NEGATIVE
Nitrite: NEGATIVE
Protein, ur: NEGATIVE mg/dL
Specific Gravity, Urine: 1.025 (ref 1.005–1.030)
pH: 6 (ref 5.0–8.0)

## 2017-05-12 LAB — I-STAT VENOUS BLOOD GAS, ED
ACID-BASE DEFICIT: 16 mmol/L — AB (ref 0.0–2.0)
BICARBONATE: 10.9 mmol/L — AB (ref 20.0–28.0)
O2 SAT: 62 %
PCO2 VEN: 28.6 mmHg — AB (ref 44.0–60.0)
PO2 VEN: 39 mmHg (ref 32.0–45.0)
TCO2: 12 mmol/L — ABNORMAL LOW (ref 22–32)
pH, Ven: 7.191 — CL (ref 7.250–7.430)

## 2017-05-12 LAB — COMPREHENSIVE METABOLIC PANEL
ALBUMIN: 3.4 g/dL — AB (ref 3.5–5.0)
ALT: 11 U/L — AB (ref 14–54)
AST: 16 U/L (ref 15–41)
Alkaline Phosphatase: 70 U/L (ref 38–126)
Anion gap: 22 — ABNORMAL HIGH (ref 5–15)
BUN: 15 mg/dL (ref 6–20)
CHLORIDE: 102 mmol/L (ref 101–111)
CO2: 12 mmol/L — AB (ref 22–32)
CREATININE: 1.23 mg/dL — AB (ref 0.44–1.00)
Calcium: 9.1 mg/dL (ref 8.9–10.3)
GFR calc Af Amer: >60 mL/min (ref >60)
GFR calc non Af Amer: 53 mL/min — ABNORMAL LOW (ref >60)
Glucose, Bld: 197 mg/dL — ABNORMAL HIGH (ref 65–99)
Potassium: 4.8 mmol/L (ref 3.5–5.1)
SODIUM: 136 mmol/L (ref 135–145)
Total Bilirubin: 1.4 mg/dL — ABNORMAL HIGH (ref 0.3–1.2)
Total Protein: 8 g/dL (ref 6.5–8.1)

## 2017-05-12 LAB — CK: Total CK: 98 U/L (ref 38–234)

## 2017-05-12 LAB — GLUCOSE, CAPILLARY
Glucose-Capillary: 163 mg/dL — ABNORMAL HIGH (ref 65–99)
Glucose-Capillary: 106 mg/dL — ABNORMAL HIGH (ref 65–99)

## 2017-05-12 LAB — I-STAT BETA HCG BLOOD, ED (MC, WL, AP ONLY): I-stat hCG, quantitative: <5 m[IU]/mL

## 2017-05-12 LAB — CBC
HCT: 39.8 % (ref 36.0–46.0)
Hemoglobin: 13.2 g/dL (ref 12.0–15.0)
MCH: 28.5 pg (ref 26.0–34.0)
MCHC: 33.2 g/dL (ref 30.0–36.0)
MCV: 86 fL (ref 78.0–100.0)
PLATELETS: 344 10*3/uL (ref 150–400)
RBC: 4.63 MIL/uL (ref 3.87–5.11)
RDW: 13.9 % (ref 11.5–15.5)
WBC: 20.6 10*3/uL — ABNORMAL HIGH (ref 4.0–10.5)

## 2017-05-12 LAB — PHOSPHORUS: PHOSPHORUS: 5.5 mg/dL — AB (ref 2.5–4.6)

## 2017-05-12 LAB — MAGNESIUM: Magnesium: 2.2 mg/dL (ref 1.7–2.4)

## 2017-05-12 LAB — LIPASE, BLOOD: LIPASE: 26 U/L (ref 11–51)

## 2017-05-12 MED ORDER — PROMETHAZINE HCL 25 MG/ML IJ SOLN: 25.0000 mg | Freq: Four times a day (QID) | INTRAMUSCULAR | Status: DC | PRN

## 2017-05-12 MED ORDER — METHOCARBAMOL 1000 MG/10ML IJ SOLN
500.0000 mg | Freq: Four times a day (QID) | INTRAVENOUS | Status: DC | PRN
  Filled 2017-05-12: qty 5

## 2017-05-12 MED ORDER — GI COCKTAIL ~~LOC~~
30.0000 mL | Freq: Once | ORAL | Status: AC
  Administered 2017-05-12: 30 mL via ORAL
  Filled 2017-05-12: qty 30

## 2017-05-12 MED ORDER — METOCLOPRAMIDE HCL 5 MG/ML IJ SOLN
10.0000 mg | Freq: Three times a day (TID) | INTRAMUSCULAR | Status: DC
  Administered 2017-05-12 – 2017-05-13 (×3): 10 mg via INTRAVENOUS
  Filled 2017-05-12 (×3): qty 2

## 2017-05-12 MED ORDER — ACETAMINOPHEN 650 MG RE SUPP: 650.0000 mg | Freq: Four times a day (QID) | RECTAL | Status: DC | PRN

## 2017-05-12 MED ORDER — ONDANSETRON HCL 4 MG/2ML IJ SOLN: 4.0000 mg | Freq: Four times a day (QID) | INTRAMUSCULAR | Status: DC | PRN

## 2017-05-12 MED ORDER — POTASSIUM CHLORIDE 10 MEQ/100ML IV SOLN: 10.0000 meq | INTRAVENOUS | Status: DC

## 2017-05-12 MED ORDER — MORPHINE SULFATE (PF) 4 MG/ML IV SOLN
1.0000 mg | INTRAVENOUS | Status: DC | PRN
  Administered 2017-05-13: 2 mg via INTRAVENOUS
  Filled 2017-05-12: qty 1

## 2017-05-12 MED ORDER — MECLIZINE HCL 25 MG PO TABS: 25.0000 mg | ORAL_TABLET | Freq: Three times a day (TID) | ORAL | Status: DC | PRN

## 2017-05-12 MED ORDER — FAMOTIDINE IN NACL 20-0.9 MG/50ML-% IV SOLN
20.0000 mg | Freq: Two times a day (BID) | INTRAVENOUS | Status: DC
  Administered 2017-05-12 – 2017-05-13 (×2): 20 mg via INTRAVENOUS
  Filled 2017-05-12 (×2): qty 50

## 2017-05-12 MED ORDER — INSULIN ASPART 100 UNIT/ML ~~LOC~~ SOLN
0.0000 [IU] | SUBCUTANEOUS | Status: DC
  Administered 2017-05-12: 4 [IU] via SUBCUTANEOUS
  Administered 2017-05-13: 3 [IU] via SUBCUTANEOUS

## 2017-05-12 MED ORDER — INSULIN GLARGINE 100 UNIT/ML ~~LOC~~ SOLN
40.0000 [IU] | SUBCUTANEOUS | Status: DC
  Administered 2017-05-13: 40 [IU] via SUBCUTANEOUS
  Filled 2017-05-12: qty 0.4

## 2017-05-12 MED ORDER — TIZANIDINE HCL 2 MG PO TABS: 4.0000 mg | ORAL_TABLET | Freq: Four times a day (QID) | ORAL | Status: DC | PRN

## 2017-05-12 MED ORDER — LIDOCAINE 5 % EX PTCH
1.0000 | MEDICATED_PATCH | CUTANEOUS | Status: DC
  Administered 2017-05-12: 1 via TRANSDERMAL
  Filled 2017-05-12: qty 1

## 2017-05-12 MED ORDER — TIMOLOL MALEATE 0.5 % OP SOLN: 1.0000 [drp] | Freq: Two times a day (BID) | OPHTHALMIC | Status: DC

## 2017-05-12 MED ORDER — SODIUM CHLORIDE 0.9 % IV BOLUS
1000.0000 mL | Freq: Once | INTRAVENOUS | Status: AC
  Administered 2017-05-12: 1000 mL via INTRAVENOUS

## 2017-05-12 MED ORDER — KETOROLAC TROMETHAMINE 30 MG/ML IJ SOLN
30.0000 mg | Freq: Once | INTRAMUSCULAR | Status: AC
  Administered 2017-05-12: 30 mg via INTRAVENOUS
  Filled 2017-05-12: qty 1

## 2017-05-12 MED ORDER — SODIUM CHLORIDE 0.9 % IV SOLN: 1000.0000 mL | INTRAVENOUS | Status: DC

## 2017-05-12 MED ORDER — ACETAMINOPHEN 325 MG PO TABS: 650.0000 mg | ORAL_TABLET | Freq: Four times a day (QID) | ORAL | Status: DC | PRN

## 2017-05-12 MED ORDER — SODIUM CHLORIDE 0.9 % IV BOLUS (SEPSIS): 1000.0000 mL | Freq: Once | INTRAVENOUS | Status: DC

## 2017-05-12 MED ORDER — SODIUM CHLORIDE 0.9 % IV SOLN
INTRAVENOUS | Status: DC
  Administered 2017-05-12 – 2017-05-13 (×3): via INTRAVENOUS

## 2017-05-12 MED ORDER — INSULIN GLARGINE 100 UNIT/ML ~~LOC~~ SOLN: 60.0000 [IU] | SUBCUTANEOUS | Status: DC

## 2017-05-12 MED ORDER — GABAPENTIN 400 MG PO CAPS
1600.0000 mg | ORAL_CAPSULE | Freq: Every morning | ORAL | Status: DC
  Administered 2017-05-13: 1600 mg via ORAL
  Filled 2017-05-12: qty 4

## 2017-05-12 MED ORDER — SODIUM CHLORIDE 0.9 % IV SOLN
INTRAVENOUS | Status: DC
  Filled 2017-05-12: qty 1

## 2017-05-12 MED ORDER — ONDANSETRON 4 MG PO TBDP
8.0000 mg | ORAL_TABLET | Freq: Once | ORAL | Status: AC
  Administered 2017-05-12: 8 mg via ORAL
  Filled 2017-05-12: qty 2

## 2017-05-12 MED ORDER — FAMOTIDINE IN NACL 20-0.9 MG/50ML-% IV SOLN
20.0000 mg | Freq: Once | INTRAVENOUS | Status: AC
  Administered 2017-05-12: 20 mg via INTRAVENOUS
  Filled 2017-05-12: qty 50

## 2017-05-12 MED ORDER — DEXTROSE-NACL 5-0.45 % IV SOLN: INTRAVENOUS | Status: DC

## 2017-05-12 MED ORDER — GABAPENTIN 800 MG PO TABS: 1600.0000 mg | ORAL_TABLET | ORAL | Status: DC

## 2017-05-12 MED ORDER — BASAGLAR KWIKPEN 100 UNIT/ML ~~LOC~~ SOPN: 110.0000 [IU] | PEN_INJECTOR | SUBCUTANEOUS | Status: DC

## 2017-05-12 MED ORDER — ATORVASTATIN CALCIUM 40 MG PO TABS
40.0000 mg | ORAL_TABLET | Freq: Every evening | ORAL | Status: DC
  Administered 2017-05-12: 40 mg via ORAL
  Filled 2017-05-12: qty 1

## 2017-05-12 MED ORDER — SODIUM CHLORIDE 0.9 % IV BOLUS (SEPSIS)
1000.0000 mL | Freq: Once | INTRAVENOUS | Status: AC
  Administered 2017-05-12: 1000 mL via INTRAVENOUS

## 2017-05-12 MED ORDER — SODIUM CHLORIDE 0.9% FLUSH
3.0000 mL | Freq: Two times a day (BID) | INTRAVENOUS | Status: DC
  Administered 2017-05-13: 3 mL via INTRAVENOUS

## 2017-05-12 MED ORDER — ONDANSETRON HCL 4 MG/2ML IJ SOLN
4.0000 mg | Freq: Once | INTRAMUSCULAR | Status: AC
  Administered 2017-05-12: 4 mg via INTRAVENOUS
  Filled 2017-05-12: qty 2

## 2017-05-12 MED ORDER — ASPIRIN EC 81 MG PO TBEC
81.0000 mg | DELAYED_RELEASE_TABLET | Freq: Two times a day (BID) | ORAL | Status: DC
  Administered 2017-05-12 – 2017-05-13 (×2): 81 mg via ORAL
  Filled 2017-05-12 (×2): qty 1

## 2017-05-12 MED ORDER — GABAPENTIN 400 MG PO CAPS
3200.0000 mg | ORAL_CAPSULE | Freq: Every evening | ORAL | Status: DC
  Administered 2017-05-12: 3200 mg via ORAL
  Filled 2017-05-12: qty 8

## 2017-05-12 NOTE — ED Notes (Signed)
Pt vomiting red colored sputum

## 2017-05-12 NOTE — ED Triage Notes (Signed)
The pt is c/o nausea and vomitiong since she had back surgery onwednesday  No bm either  Since wed am  lmp 4-24
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
## 2017-05-12 NOTE — H&P (Signed)
History and Physical    [name]:[id number] DOB: May 22, 1970 DOA: 05/12/2017  PCP: Jarrett Soho, PA-C   Patient coming from: home   Chief Complaint: N/V  HPI: [name] is a 47 y.o. female with medical history significant of diabetes on insulin complicated by diabetic retinopathy and bilateral peripheral neuropathy, hyperlipidemia, anxiety/depression, PCOS, and chronic genetic back condition who comes in with nausea and vomiting and inability to take p.o. and noted to have AKI.  Patient reports that she had her second back [facility] on L4-L5 [facility] and did well postoperatively except she began to have episodes of nonbilious nonbloody emesis.  She was discharged home with oral cyclobenzaprine and narcotics for pain control.  She continued to have episodes of nonbilious nonbloody emesis.  She reported her blood sugars were around the 100-200 which is baseline for her.  She did not have any diarrhea.  She reports that possibly her nausea or vomiting worsened with taking the narcotics.  She denies any abdominal pain, fevers, cough, congestion, syncope/presyncope, lower extremity edema that is new, chest pain.  She does have chronic lower extremity numbness and tingling but no new weakness in the lower extremities.  ED Course: In the ED vitals were notable for mild tachycardia to 100.3.  Labs are notable for mild AKI with creatinine of 1.2 with baseline around 0.5 0.6.  White blood cell count was 20.6.  Patient was noted to be acidotic however it was not felt likely to be consistent with DKA.  Review of Systems: As per HPI otherwise 10 point review of systems negative.    Past Medical History:  Diagnosis Date  . Anxiety   . Depression   . Diabetes mellitus without complication (HCC)   . High cholesterol   . Hypertension   . Neuropathy   . PCOS (polycystic ovarian syndrome)     Past Surgical History:  Procedure Laterality Date  . ELBOW SURGERY    .  INSERTION OF AHMED VALVE Right 09/07/2015   Procedure: INSERTION OF AHMED VALVE;  Surgeon: Edmon Crape, MD;  Location: [facility] OR;  Service: Ophthalmology;  Laterality: Right;  . LASER PHOTO ABLATION Right 09/07/2015   Procedure: LASER PHOTO ABLATION;  Surgeon: Edmon Crape, MD;  Location: [facility] OR;  Service: Ophthalmology;  Laterality: Right;  . PARS PLANA VITRECTOMY Right 09/07/2015   Procedure: PARS PLANA VITRECTOMY WITH 25 GAUGE,, with panretinal endolaser,;  Surgeon: Edmon Crape, MD;  Location: [facility] OR;  Service: Ophthalmology;  Laterality: Right;     reports that she quit smoking about 16 years ago. Her smoking use included cigarettes. She has never used smokeless tobacco. She reports that she drinks alcohol. She reports that she has current or past drug history. Drug: Marijuana.  Allergies  Allergen Reactions  . No Known Allergies   . Tramadol     Family History  Problem Relation Age of Onset  . Depression Mother   . Anxiety disorder Mother   . Diabetes Mother   . Diabetes Father   . Diabetes Brother    Unacceptable: Noncontributory, unremarkable, or negative. Acceptable: Family history reviewed and not pertinent (If you reviewed it)  Prior to Admission medications   Medication Sig Start Date End Date Taking? Authorizing Provider  aspirin EC 81 MG tablet Take 81 mg by mouth 2 (two) times daily.   Yes [provider]  atorvastatin (LIPITOR) 40 MG tablet Take 40 mg by mouth every evening. 07/14/15  Yes [provider]  BIOTIN FORTE PO Take 5,000 mg by mouth 2 (two) times daily.    Yes [provider]  celecoxib (CELEBREX) 200 MG capsule Take 200 mg by mouth 2 (two) times daily.    Yes [provider]  cyclobenzaprine (FLEXERIL) 10 MG tablet Take 10 mg by mouth 3 (three) times daily as needed for muscle spasms.   Yes [provider]  empagliflozin (JARDIANCE) 10 MG TABS tablet Take 10 mg by mouth daily. 05/19/16  Yes Romero Belling, MD  gabapentin  (NEURONTIN) 800 MG tablet Take 1,600-3,200 mg by mouth See admin instructions. Take 1600 mg by mouth in the morning and take 3200 mg by mouth in the evening.   Yes [provider]  glucose blood (ONE TOUCH ULTRA TEST) test strip Use as directed once daily to check blood sugar DX E11.9 07/07/16  Yes Romero Belling, MD  HYDROcodone-acetaminophen (NORCO/VICODIN) 5-325 MG tablet Take 1-2 tablets by mouth every 6 (six) hours as needed for moderate pain.  08/11/16  Yes [provider]  Insulin Glargine (BASAGLAR KWIKPEN) 100 UNIT/ML SOPN Inject 1.1 mLs (110 Units total) into the skin every morning. And pen needles 3/day 05/19/16  Yes Romero Belling, MD  metFORMIN (GLUCOPHAGE) 1000 MG tablet Take 1,000 mg by mouth 2 (two) times daily with a meal.   Yes [provider]  Multiple Vitamins-Minerals (MULTIVITAMIN GUMMIES ADULT PO) Take 1 capsule by mouth daily.   Yes [provider]  furosemide (LASIX) 20 MG tablet Take 20 mg by mouth every evening.     [provider]  meclizine (ANTIVERT) 25 MG tablet Take 1 tablet (25 mg total) by mouth 3 (three) times daily as needed for dizziness. 07/27/15   Samuel Jester, DO  nitrofurantoin, macrocrystal-monohydrate, (MACROBID) 100 MG capsule  08/07/16   [provider]  ondansetron (ZOFRAN) 4 MG tablet Take 1 tablet (4 mg total) by mouth every 8 (eight) hours as needed for nausea or vomiting. 07/27/15   Samuel Jester, DO  timolol (TIMOPTIC) 0.5 % ophthalmic solution Place 1 drop into the right eye 2 (two) times daily.  06/04/15   [provider]  tiZANidine (ZANAFLEX) 4 MG tablet  08/11/16   [provider]    Physical Exam: Vitals:   05/12/17 1028 05/12/17 1254 05/12/17 1600 05/12/17 1630  BP: 109/80 127/62 (!) 115/56 (!) 132/54  Pulse: (!) 110 (!) 105 (!) 106 (!) 102  Resp: 20 18    Temp: 98.2 F (36.8 C) 99.3 F (37.4 C)    TempSrc: Oral Oral    SpO2: 100% 100% 100% 100%  Weight:        Height:        Constitutional: NAD, calm, comfortable Vitals:   05/12/17 1028 05/12/17 1254 05/12/17 1600 05/12/17 1630  BP: 109/80 127/62 (!) 115/56 (!) 132/54  Pulse: (!) 110 (!) 105 (!) 106 (!) 102  Resp: 20 18    Temp: 98.2 F (36.8 C) 99.3 F (37.4 C)    TempSrc: Oral Oral    SpO2: 100% 100% 100% 100%  Weight:      Height:       Eyes: Anicteric sclera ENMT: Mucous membranes Neck: normal, supple, Respiratory: clear to auscultation bilaterally, no wheezing, no crackles. Normal respiratory effort. No accessory muscle use.  Cardiovascular: Regular rate and rhythm, no murmurs Abdomen: no tenderness, no masses palpated. No hepatosplenomegaly. Bowel sounds positive.  Musculoskeletal: 1+ lower extremity edema Skin: Incision site is clean dry and intact Neurologic: Diminished sensation in bilateral  lower extremities however 5 out of 5 strength in bilateral lower extremities, absent DTR Psychiatric: Normal judgment and insight. Alert and oriented x 3. Normal mood.   (Anything < 9 systems with 2 bullets each down codes to level 1) (If patient refuses exam can't bill higher level) (Make sure to document decubitus ulcers present on admission -- if possible -- and whether patient has chronic indwelling catheter at time of admission)  Labs on Admission: I have personally reviewed following labs and imaging studies  CBC: Recent Labs  Lab 05/12/17 0820  WBC 20.6*  HGB 13.2  HCT 39.8  MCV 86.0  PLT 344   Basic Metabolic Panel: Recent Labs  Lab 05/12/17 0820 05/12/17 1436  NA 136 137  K 4.8 4.9  CL 102 106  CO2 12* 12*  GLUCOSE 197* 195*  BUN 15 16  CREATININE 1.23* 1.18*  CALCIUM 9.1 8.7*   GFR: Estimated Creatinine Clearance: 76.8 mL/min (A) (by C-G formula based on SCr of 1.18 mg/dL (H)). Liver Function Tests: Recent Labs  Lab 05/12/17 0820  AST 16  ALT 11*  ALKPHOS 70  BILITOT 1.4*  PROT 8.0  ALBUMIN 3.4*   Recent Labs  Lab 05/12/17 0820  LIPASE 26    No results for input(s): AMMONIA in the last 168 hours. Coagulation Profile: No results for input(s): INR, PROTIME in the last 168 hours. Cardiac Enzymes: No results for input(s): CKTOTAL, CKMB, CKMBINDEX, TROPONINI in the last 168 hours. BNP (last 3 results) No results for input(s): PROBNP in the last 8760 hours. HbA1C: No results for input(s): HGBA1C in the last 72 hours. CBG: No results for input(s): GLUCAP in the last 168 hours. Lipid Profile: No results for input(s): CHOL, HDL, LDLCALC, TRIG, CHOLHDL, LDLDIRECT in the last 72 hours. Thyroid Function Tests: No results for input(s): TSH, T4TOTAL, FREET4, T3FREE, THYROIDAB in the last 72 hours. Anemia Panel: No results for input(s): VITAMINB12, FOLATE, FERRITIN, TIBC, IRON, RETICCTPCT in the last 72 hours. Urine analysis:    Component Value Date/Time   COLORURINE YELLOW 05/12/2017 0828   APPEARANCEUR CLEAR 05/12/2017 0828   LABSPEC 1.025 05/12/2017 0828   PHURINE 6.0 05/12/2017 0828   GLUCOSEU NEGATIVE 05/12/2017 0828   HGBUR NEGATIVE 05/12/2017 0828   BILIRUBINUR NEGATIVE 05/12/2017 0828   KETONESUR NEGATIVE 05/12/2017 0828   PROTEINUR NEGATIVE 05/12/2017 0828   UROBILINOGEN 0.2 11/28/2013 0023   NITRITE NEGATIVE 05/12/2017 0828   LEUKOCYTESUR NEGATIVE 05/12/2017 0828    Radiological Exams on Admission: No results found.  EKG: Independently reviewed.  None performed  Assessment/Plan Principal Problem:   Acute kidney injury (HCC) Active Problems:   Intractable nausea and vomiting   Hypertension   High cholesterol   PCOS (polycystic ovarian syndrome)   Neuropathy   #) Intractable nausea/vomiting: Patient does have quite an elevated white blood cell count suspect that most of her nausea and vomiting is related to either narcotic use with a component of gastroparesis.  She has no evidence of an intra-abdominal infection nothing on exam that is concerning.  It is unlikely that this is related to her recent spinal  surgery either she has no neurological complaints.  Another cause could possibly be related to biliary colic though her symptoms are not typical for it. - Clear liquid diet -Scheduled moclobemide 10 mg every 8 -PRN ondansetron -We will consider imaging if patient has any signs or symptoms of infection  #) AKI: Likely in the setting of dehydration -IV fluids -Hold celecoxib and NSAIDs  #(type 2  diabetes on insulin: - We will keep patient on insulin glargine at 40 units every morning -Sliding scale insulin plus a CHS -Hold home empagliflozin and metformin   #) Lower extremity neuropathy: -Continue gabapentin -Hold furosemide  #) Chronic back pain: -Continue muscle relaxants PRN  #) Hyperlipidemia: -Continue atorvastatin 40 mg daily -Continue aspirin 81 mg daily  Fluids: Gentle IV fluids Elect lites: Monitor and supplement Nutrition: Carb restricted diet  Prophylaxis: Enoxaparin  Disposition: Pending tolerating p.o. and improvement of lab abnormalities  Full code   Delaine Lame MD [facility]   If 7PM-7AM, please contact night-coverage [email or web address] Password [email or web address]  05/12/2017, 5:11 PM

## 2017-05-13 DIAGNOSIS — G629 Polyneuropathy, unspecified: Secondary | ICD-10-CM

## 2017-05-13 DIAGNOSIS — R112 Nausea with vomiting, unspecified: Secondary | ICD-10-CM

## 2017-05-13 LAB — HIV ANTIBODY (ROUTINE TESTING W REFLEX): HIV Screen 4th Generation wRfx: NONREACTIVE

## 2017-05-13 LAB — COMPREHENSIVE METABOLIC PANEL
ALBUMIN: 2.4 g/dL — AB (ref 3.5–5.0)
ALT: 8 U/L — AB (ref 14–54)
AST: 8 U/L — AB (ref 15–41)
Alkaline Phosphatase: 47 U/L (ref 38–126)
Anion gap: 12 (ref 5–15)
BUN: 12 mg/dL (ref 6–20)
CHLORIDE: 111 mmol/L (ref 101–111)
CO2: 15 mmol/L — AB (ref 22–32)
CREATININE: 0.89 mg/dL (ref 0.44–1.00)
Calcium: 8 mg/dL — ABNORMAL LOW (ref 8.9–10.3)
GFR calc Af Amer: >60 mL/min (ref >60)
GLUCOSE: 128 mg/dL — AB (ref 65–99)
POTASSIUM: 4 mmol/L (ref 3.5–5.1)
Sodium: 138 mmol/L (ref 135–145)
Total Bilirubin: 0.9 mg/dL (ref 0.3–1.2)
Total Protein: 6.1 g/dL — ABNORMAL LOW (ref 6.5–8.1)

## 2017-05-13 LAB — GLUCOSE, CAPILLARY
Glucose-Capillary: 113 mg/dL — ABNORMAL HIGH (ref 65–99)
Glucose-Capillary: 114 mg/dL — ABNORMAL HIGH (ref 65–99)
Glucose-Capillary: 121 mg/dL — ABNORMAL HIGH (ref 65–99)

## 2017-05-13 LAB — CBC
HCT: 30.9 % — ABNORMAL LOW (ref 36.0–46.0)
Hemoglobin: 10.3 g/dL — ABNORMAL LOW (ref 12.0–15.0)
MCH: 28.2 pg (ref 26.0–34.0)
MCHC: 33.3 g/dL (ref 30.0–36.0)
MCV: 84.7 fL (ref 78.0–100.0)
Platelets: 281 10*3/uL (ref 150–400)
RBC: 3.65 MIL/uL — ABNORMAL LOW (ref 3.87–5.11)
RDW: 14 % (ref 11.5–15.5)
WBC: 12 10*3/uL — AB (ref 4.0–10.5)

## 2017-05-13 MED ORDER — ONDANSETRON HCL 4 MG PO TABS: 4.0000 mg | ORAL_TABLET | Freq: Three times a day (TID) | ORAL | 0 refills | Status: DC | PRN

## 2017-05-13 NOTE — Progress Notes (Signed)
Pt discharged home in stable condition after going over discharge instructions with no concerns voiced. AVS given before leaving unit 

## 2017-05-13 NOTE — Discharge Summary (Signed)
PATIENT DETAILS Name: [name] Age: 47 y.o. Sex: female Date of Birth: 09-12-1970 MRN: [id number]. Admitting Physician: Delaine Lame, MD WUJ:[id number], Toni Amend, PA-C  Admit Date: 05/12/2017 Discharge date: 05/13/2017  Recommendations for Outpatient Follow-up:  1. Follow up with PCP in 1-2 weeks 2. Please obtain BMP/CBC in one week 3. If she has recurrent issues with nausea and vomiting in the future-consider GI referral for work-up of gastroparesis.   Admitted From:  Home  Disposition: Home   Home Health: No  Equipment/Devices: None  Discharge Condition: Stable  CODE STATUS: FULL CODE  Diet recommendation:  Heart Healthy / Carb Modified   Brief Summary: See H&P, Labs, Consult and Test reports for all details in brief, patient is a 47 year old female with insulin-dependent diabetes, peripheral neuropathy, recent lumbar/back surgery-placed on narcotics-admitted with intractable nausea and vomiting.  She was given supportive care-she has rapidly improved overnight-no longer having nausea vomiting-and is requesting discharge home.  Brief Hospital Course: Intractable nausea and vomiting: Rapidly improved after initiation of supportive care and antiemetics.  She last vomited yesterday when she was in the emergency room-no longer vomiting.  Tolerating advancement in diet.  She requests that she be discharged home this morning.  Her abdominal exam is completely benign-she last had a bowel movement earlier this morning.  Since she has rapidly improved, she will be discharged home on as needed Zofran.  Etiology of vomiting remains unknown-perhaps she may have had food poisoning-or this could be mild gastroparesis worsened due to recent back surgery/narcotics.  In any event-since she has rapidly improved-I suspect we can discharge her home-have asked her to stay on a full liquid/soft diet for the next few days before advancing further.  Have also advised that if she has  recurrent episodes of nausea and vomiting that she probably will need referral from a primary care practitioner for a gastroenterologist to rule out underlying gastroparesis.  She is aware that she needs to minimize narcotics as much as possible.  Acute kidney injury: Likely hemodynamically mediated-resolved with supportive care.  DM-2: Resume usual insulin regimen and oral hypoglycemic agents on discharge.  Lower extremity neuropathy: Suspect secondary diabetes-resume Neurontin on discharge.  Rest of her medical issues were stable during the short hospital stay.  Procedures/Studies: None  Discharge Diagnoses:  Principal Problem:   Acute kidney injury (HCC) Active Problems:   Intractable nausea and vomiting   Hypertension   High cholesterol   PCOS (polycystic ovarian syndrome)   Neuropathy   Discharge Instructions:  Activity:  As tolerated   Discharge Instructions    Diet - low sodium heart healthy   Complete by:  As directed    Discharge instructions   Complete by:  As directed    Follow with Primary MD  Jarrett Soho, PA-C  In 1 week  Please stay on a full liquid/soft diet for the next 4 to 5 days before advancing further.  If you continue to have recurrent episodes of nausea/vomiting in the future-please ask your primary care practitioner for a referral to a gastroenterologist.    Please get a complete blood count and chemistry panel checked by your Primary MD at your next visit, and again as instructed by your Primary MD.  Get Medicines reviewed and adjusted: Please take all your medications with you for your next visit with your Primary MD  Laboratory/radiological data: Please request your Primary MD to go over all hospital tests and procedure/radiological results at the follow up, please ask your Primary MD to  get all Hospital records sent to his/her office.  In some cases, they will be blood work, cultures and biopsy results pending at the time of your  discharge. Please request that your primary care M.D. follows up on these results.  Also Note the following: If you experience worsening of your admission symptoms, develop shortness of breath, life threatening emergency, suicidal or homicidal thoughts you must seek medical attention immediately by calling 911 or calling your MD immediately  if symptoms less severe.  You must read complete instructions/literature along with all the possible adverse reactions/side effects for all the Medicines you take and that have been prescribed to you. Take any new Medicines after you have completely understood and accpet all the possible adverse reactions/side effects.   Do not drive when taking Pain medications or sleeping medications (Benzodaizepines)  Do not take more than prescribed Pain, Sleep and Anxiety Medications. It is not advisable to combine anxiety,sleep and pain medications without talking with your primary care practitioner  Special Instructions: If you have smoked or chewed Tobacco  in the last 2 yrs please stop smoking, stop any regular Alcohol  and or any Recreational drug use.  Wear Seat belts while driving.  Please note: You were cared for by a hospitalist during your hospital stay. Once you are discharged, your primary care physician will handle any further medical issues. Please note that NO REFILLS for any discharge medications will be authorized once you are discharged, as it is imperative that you return to your primary care physician (or establish a relationship with a primary care physician if you do not have one) for your post hospital discharge needs so that they can reassess your need for medications and monitor your lab values.   Increase activity slowly   Complete by:  As directed      Allergies as of 05/13/2017      Reactions   No Known Allergies    Tramadol       Medication List    STOP taking these medications   BIOTIN FORTE PO     TAKE these medications     aspirin EC 81 MG tablet Take 81 mg by mouth 2 (two) times daily.   atorvastatin 40 MG tablet Commonly known as:  LIPITOR Take 40 mg by mouth every evening.   BASAGLAR KWIKPEN 100 UNIT/ML Sopn Inject 1.1 mLs (110 Units total) into the skin every morning. And pen needles 3/day   celecoxib 200 MG capsule Commonly known as:  CELEBREX Take 200 mg by mouth 2 (two) times daily.   cyclobenzaprine 10 MG tablet Commonly known as:  FLEXERIL Take 10 mg by mouth 3 (three) times daily as needed for muscle spasms.   empagliflozin 10 MG Tabs tablet Commonly known as:  JARDIANCE Take 10 mg by mouth daily.   furosemide 20 MG tablet Commonly known as:  LASIX Take 20 mg by mouth every evening.   gabapentin 800 MG tablet Commonly known as:  NEURONTIN Take 1,600-3,200 mg by mouth See admin instructions. Take 1600 mg by mouth in the morning and take 3200 mg by mouth in the evening.   glucose blood test strip Commonly known as:  ONE TOUCH ULTRA TEST Use as directed once daily to check blood sugar DX E11.9   HYDROcodone-acetaminophen 5-325 MG tablet Commonly known as:  NORCO/VICODIN Take 1-2 tablets by mouth every 6 (six) hours as needed for moderate pain.   meclizine 25 MG tablet Commonly known as:  ANTIVERT Take 1 tablet (25  mg total) by mouth 3 (three) times daily as needed for dizziness.   metFORMIN 1000 MG tablet Commonly known as:  GLUCOPHAGE Take 1,000 mg by mouth 2 (two) times daily with a meal.   MULTIVITAMIN GUMMIES ADULT PO Take 1 capsule by mouth daily.   nitrofurantoin (macrocrystal-monohydrate) 100 MG capsule Commonly known as:  MACROBID   ondansetron 4 MG tablet Commonly known as:  ZOFRAN Take 1 tablet (4 mg total) by mouth every 8 (eight) hours as needed for nausea or vomiting.   timolol 0.5 % ophthalmic solution Commonly known as:  TIMOPTIC Place 1 drop into the right eye 2 (two) times daily.   tiZANidine 4 MG tablet Commonly known as:  ZANAFLEX       Follow-up Information    Jarrett Soho, PA-C. Schedule an appointment as soon as possible for a visit in 1 week(s).   Specialty:  Family Medicine Contact information: [address] [place] [address] [phone]          Allergies  Allergen Reactions  . No Known Allergies   . Tramadol     Consultations:   None   Other Procedures/Studies:  No results found.   TODAY-DAY OF DISCHARGE:  Subjective:   [place] today has no headache,no chest abdominal pain,no new weakness tingling or numbness, feels much better wants to go home today.   Objective:   Blood pressure 120/63, pulse 89, temperature 98.5 F (36.9 C), temperature source Oral, resp. rate 18, height  (1.626 m), weight 92.1 kg (203 lb 0.7 oz), last menstrual period 04/25/2017, SpO2 100 %.  Intake/Output Summary (Last 24 hours) at 05/13/2017 0857 Last data filed at 05/12/2017 1641 Gross per 24 hour  Intake 2000 ml  Output -  Net 2000 ml   Filed Weights   05/12/17 0646 05/12/17 1713  Weight: 117.9 kg (260 lb) 92.1 kg (203 lb 0.7 oz)    Exam: Awake Alert, Oriented *3, No new F.N deficits, Normal affect [place].AT,PERRAL Supple Neck,No JVD, No cervical lymphadenopathy appriciated.  Symmetrical Chest wall movement, Good air movement bilaterally, CTAB RRR,No Gallops,Rubs or new Murmurs, No Parasternal Heave +ve B.Sounds, Abd Soft, Non tender, No organomegaly appriciated, No rebound -guarding or rigidity. No Cyanosis, Clubbing or edema, No new Rash or bruise   PERTINENT RADIOLOGIC STUDIES: No results found.   PERTINENT LAB RESULTS: CBC: Recent Labs    05/12/17 0820 05/13/17 0319  WBC 20.6* 12.0*  HGB 13.2 10.3*  HCT 39.8 30.9*  PLT 344 281   CMET CMP     Component Value Date/Time   NA 138 05/13/2017 0319   NA 137 09/18/2013 0611   K 4.0 05/13/2017 0319   K 4.2 09/18/2013 0611   CL 111 05/13/2017 0319   CL 104 09/18/2013 0611   CO2 15 (L) 05/13/2017 0319   CO2 23  09/18/2013 0611   GLUCOSE 128 (H) 05/13/2017 0319   GLUCOSE 270 (H) 09/18/2013 0611   BUN 12 05/13/2017 0319   BUN 11 09/18/2013 0611   CREATININE 0.89 05/13/2017 0319   CREATININE 0.64 09/18/2013 0611   CALCIUM 8.0 (L) 05/13/2017 0319   CALCIUM 8.4 (L) 09/18/2013 0611   PROT 6.1 (L) 05/13/2017 0319   ALBUMIN 2.4 (L) 05/13/2017 0319   AST 8 (L) 05/13/2017 0319   ALT 8 (L) 05/13/2017 0319   ALKPHOS 47 05/13/2017 0319   BILITOT 0.9 05/13/2017 0319   GFRNONAA >60 05/13/2017 0319   GFRNONAA >60 09/18/2013 0611   GFRAA >60 05/13/2017 0319  GFRAA >60 09/18/2013 0611    GFR Estimated Creatinine Clearance: 88.8 mL/min (by C-G formula based on SCr of 0.89 mg/dL). Recent Labs    05/12/17 0820  LIPASE 26   Recent Labs    05/12/17 1436  CKTOTAL 98   Invalid input(s): POCBNP No results for input(s): DDIMER in the last 72 hours. No results for input(s): HGBA1C in the last 72 hours. No results for input(s): CHOL, HDL, LDLCALC, TRIG, CHOLHDL, LDLDIRECT in the last 72 hours. No results for input(s): TSH, T4TOTAL, T3FREE, THYROIDAB in the last 72 hours.  Invalid input(s): FREET3 No results for input(s): VITAMINB12, FOLATE, FERRITIN, TIBC, IRON, RETICCTPCT in the last 72 hours. Coags: No results for input(s): INR in the last 72 hours.  Invalid input(s): PT Microbiology: No results found for this or any previous visit (from the past 240 hour(s)).  FURTHER DISCHARGE INSTRUCTIONS:  Get Medicines reviewed and adjusted: Please take all your medications with you for your next visit with your Primary MD  Laboratory/radiological data: Please request your Primary MD to go over all hospital tests and procedure/radiological results at the follow up, please ask your Primary MD to get all Hospital records sent to his/her office.  In some cases, they will be blood work, cultures and biopsy results pending at the time of your discharge. Please request that your primary care M.D. goes through  all the records of your hospital data and follows up on these results.  Also Note the following: If you experience worsening of your admission symptoms, develop shortness of breath, life threatening emergency, suicidal or homicidal thoughts you must seek medical attention immediately by calling 911 or calling your MD immediately  if symptoms less severe.  You must read complete instructions/literature along with all the possible adverse reactions/side effects for all the Medicines you take and that have been prescribed to you. Take any new Medicines after you have completely understood and accpet all the possible adverse reactions/side effects.   Do not drive when taking Pain medications or sleeping medications (Benzodaizepines)  Do not take more than prescribed Pain, Sleep and Anxiety Medications. It is not advisable to combine anxiety,sleep and pain medications without talking with your primary care practitioner  Special Instructions: If you have smoked or chewed Tobacco  in the last 2 yrs please stop smoking, stop any regular Alcohol  and or any Recreational drug use.  Wear Seat belts while driving.  Please note: You were cared for by a hospitalist during your hospital stay. Once you are discharged, your primary care physician will handle any further medical issues. Please note that NO REFILLS for any discharge medications will be authorized once you are discharged, as it is imperative that you return to your primary care physician (or establish a relationship with a primary care physician if you do not have one) for your post hospital discharge needs so that they can reassess your need for medications and monitor your lab values.  Total Time spent coordinating discharge including counseling, education and face to face time equals 35 minutes.  SignedJeoffrey Massed 05/13/2017 8:57 AM

## 2017-05-18 ENCOUNTER — Inpatient Hospital Stay (HOSPITAL_COMMUNITY)
Admission: EM | Admit: 2017-05-18 | Discharge: 2017-05-21 | DRG: 637 | Disposition: A | Discharge status: Home / Self Care | Payer: 59 | Attending: Internal Medicine | Admitting: Internal Medicine

## 2017-05-18 ENCOUNTER — Encounter (HOSPITAL_COMMUNITY): Payer: Self-pay | Admitting: Emergency Medicine

## 2017-05-18 ENCOUNTER — Other Ambulatory Visit: Payer: Self-pay

## 2017-05-18 ENCOUNTER — Emergency Department (HOSPITAL_COMMUNITY): Payer: 59

## 2017-05-18 DIAGNOSIS — F314 Bipolar disorder, current episode depressed, severe, without psychotic features: Secondary | ICD-10-CM | POA: Diagnosis present

## 2017-05-18 DIAGNOSIS — F419 Anxiety disorder, unspecified: Secondary | ICD-10-CM | POA: Diagnosis present

## 2017-05-18 DIAGNOSIS — E876 Hypokalemia: Secondary | ICD-10-CM | POA: Diagnosis not present

## 2017-05-18 DIAGNOSIS — G8929 Other chronic pain: Secondary | ICD-10-CM | POA: Diagnosis present

## 2017-05-18 DIAGNOSIS — Z87891 Personal history of nicotine dependence: Secondary | ICD-10-CM | POA: Diagnosis not present

## 2017-05-18 DIAGNOSIS — K838 Other specified diseases of biliary tract: Secondary | ICD-10-CM | POA: Diagnosis present

## 2017-05-18 DIAGNOSIS — Z833 Family history of diabetes mellitus: Secondary | ICD-10-CM

## 2017-05-18 DIAGNOSIS — Z6832 Body mass index (BMI) 32.0-32.9, adult: Secondary | ICD-10-CM

## 2017-05-18 DIAGNOSIS — E282 Polycystic ovarian syndrome: Secondary | ICD-10-CM | POA: Diagnosis present

## 2017-05-18 DIAGNOSIS — I10 Essential (primary) hypertension: Secondary | ICD-10-CM | POA: Diagnosis present

## 2017-05-18 DIAGNOSIS — N179 Acute kidney failure, unspecified: Secondary | ICD-10-CM | POA: Diagnosis present

## 2017-05-18 DIAGNOSIS — Z818 Family history of other mental and behavioral disorders: Secondary | ICD-10-CM

## 2017-05-18 DIAGNOSIS — M5416 Radiculopathy, lumbar region: Secondary | ICD-10-CM | POA: Diagnosis present

## 2017-05-18 DIAGNOSIS — E10319 Type 1 diabetes mellitus with unspecified diabetic retinopathy without macular edema: Secondary | ICD-10-CM | POA: Diagnosis present

## 2017-05-18 DIAGNOSIS — E1021 Type 1 diabetes mellitus with diabetic nephropathy: Secondary | ICD-10-CM | POA: Diagnosis present

## 2017-05-18 DIAGNOSIS — R109 Unspecified abdominal pain: Secondary | ICD-10-CM | POA: Diagnosis present

## 2017-05-18 DIAGNOSIS — R748 Abnormal levels of other serum enzymes: Secondary | ICD-10-CM

## 2017-05-18 DIAGNOSIS — Z79899 Other long term (current) drug therapy: Secondary | ICD-10-CM

## 2017-05-18 DIAGNOSIS — Z87898 Personal history of other specified conditions: Secondary | ICD-10-CM | POA: Diagnosis not present

## 2017-05-18 DIAGNOSIS — Z794 Long term (current) use of insulin: Secondary | ICD-10-CM

## 2017-05-18 DIAGNOSIS — E875 Hyperkalemia: Secondary | ICD-10-CM | POA: Diagnosis present

## 2017-05-18 DIAGNOSIS — E1010 Type 1 diabetes mellitus with ketoacidosis without coma: Principal | ICD-10-CM | POA: Diagnosis present

## 2017-05-18 DIAGNOSIS — R1110 Vomiting, unspecified: Secondary | ICD-10-CM | POA: Diagnosis present

## 2017-05-18 DIAGNOSIS — E1040 Type 1 diabetes mellitus with diabetic neuropathy, unspecified: Secondary | ICD-10-CM | POA: Diagnosis present

## 2017-05-18 DIAGNOSIS — IMO0002 Reserved for concepts with insufficient information to code with codable children: Secondary | ICD-10-CM

## 2017-05-18 DIAGNOSIS — R112 Nausea with vomiting, unspecified: Secondary | ICD-10-CM | POA: Diagnosis present

## 2017-05-18 DIAGNOSIS — E1110 Type 2 diabetes mellitus with ketoacidosis without coma: Secondary | ICD-10-CM | POA: Diagnosis present

## 2017-05-18 DIAGNOSIS — E860 Dehydration: Secondary | ICD-10-CM | POA: Diagnosis present

## 2017-05-18 DIAGNOSIS — R000 Tachycardia, unspecified: Secondary | ICD-10-CM | POA: Diagnosis present

## 2017-05-18 DIAGNOSIS — E7800 Pure hypercholesterolemia, unspecified: Secondary | ICD-10-CM | POA: Diagnosis present

## 2017-05-18 DIAGNOSIS — E119 Type 2 diabetes mellitus without complications: Secondary | ICD-10-CM

## 2017-05-18 DIAGNOSIS — E43 Unspecified severe protein-calorie malnutrition: Secondary | ICD-10-CM | POA: Diagnosis present

## 2017-05-18 DIAGNOSIS — Z79890 Hormone replacement therapy (postmenopausal): Secondary | ICD-10-CM

## 2017-05-18 DIAGNOSIS — Z79891 Long term (current) use of opiate analgesic: Secondary | ICD-10-CM

## 2017-05-18 DIAGNOSIS — Z9889 Other specified postprocedural states: Secondary | ICD-10-CM | POA: Diagnosis not present

## 2017-05-18 DIAGNOSIS — R0682 Tachypnea, not elsewhere classified: Secondary | ICD-10-CM | POA: Diagnosis present

## 2017-05-18 DIAGNOSIS — Z7982 Long term (current) use of aspirin: Secondary | ICD-10-CM

## 2017-05-18 DIAGNOSIS — K828 Other specified diseases of gallbladder: Secondary | ICD-10-CM | POA: Diagnosis present

## 2017-05-18 DIAGNOSIS — E785 Hyperlipidemia, unspecified: Secondary | ICD-10-CM | POA: Diagnosis present

## 2017-05-18 DIAGNOSIS — G629 Polyneuropathy, unspecified: Secondary | ICD-10-CM

## 2017-05-18 LAB — COMPREHENSIVE METABOLIC PANEL
ALBUMIN: 3.6 g/dL (ref 3.5–5.0)
ALT: 10 U/L — ABNORMAL LOW (ref 14–54)
AST: 12 U/L — ABNORMAL LOW (ref 15–41)
Alkaline Phosphatase: 80 U/L (ref 38–126)
Anion gap: 25 — ABNORMAL HIGH (ref 5–15)
BUN: 23 mg/dL — AB (ref 6–20)
CHLORIDE: 105 mmol/L (ref 101–111)
CO2: 9 mmol/L — AB (ref 22–32)
Calcium: 9.1 mg/dL (ref 8.9–10.3)
Creatinine, Ser: 1.64 mg/dL — ABNORMAL HIGH (ref 0.44–1.00)
GFR calc Af Amer: 43 mL/min — ABNORMAL LOW (ref >60)
GFR calc non Af Amer: 37 mL/min — ABNORMAL LOW (ref >60)
GLUCOSE: 300 mg/dL — AB (ref 65–99)
POTASSIUM: 5.7 mmol/L — AB (ref 3.5–5.1)
SODIUM: 139 mmol/L (ref 135–145)
Total Bilirubin: 1.5 mg/dL — ABNORMAL HIGH (ref 0.3–1.2)
Total Protein: 8.2 g/dL — ABNORMAL HIGH (ref 6.5–8.1)

## 2017-05-18 LAB — BASIC METABOLIC PANEL
ANION GAP: 8 (ref 5–15)
Anion gap: 21 — ABNORMAL HIGH (ref 5–15)
Anion gap: 17 — ABNORMAL HIGH (ref 5–15)
BUN: 23 mg/dL — ABNORMAL HIGH (ref 6–20)
BUN: 22 mg/dL — ABNORMAL HIGH (ref 6–20)
BUN: 13 mg/dL (ref 6–20)
CHLORIDE: 107 mmol/L (ref 101–111)
CHLORIDE: 110 mmol/L (ref 101–111)
CHLORIDE: 113 mmol/L — AB (ref 101–111)
CO2: 11 mmol/L — AB (ref 22–32)
CO2: 8 mmol/L — AB (ref 22–32)
CO2: 15 mmol/L — AB (ref 22–32)
Calcium: 8.5 mg/dL — ABNORMAL LOW (ref 8.9–10.3)
Calcium: 8 mg/dL — ABNORMAL LOW (ref 8.9–10.3)
Calcium: 7.9 mg/dL — ABNORMAL LOW (ref 8.9–10.3)
Creatinine, Ser: 1.67 mg/dL — ABNORMAL HIGH (ref 0.44–1.00)
Creatinine, Ser: 1.35 mg/dL — ABNORMAL HIGH (ref 0.44–1.00)
Creatinine, Ser: 1.12 mg/dL — ABNORMAL HIGH (ref 0.44–1.00)
GFR calc Af Amer: >60 mL/min (ref >60)
GFR calc non Af Amer: 36 mL/min — ABNORMAL LOW (ref >60)
GFR calc non Af Amer: 47 mL/min — ABNORMAL LOW (ref >60)
GFR calc non Af Amer: 59 mL/min — ABNORMAL LOW (ref >60)
GFR, EST AFRICAN AMERICAN: 42 mL/min — AB (ref >60)
GFR, EST AFRICAN AMERICAN: 54 mL/min — AB (ref >60)
Glucose, Bld: 286 mg/dL — ABNORMAL HIGH (ref 65–99)
Glucose, Bld: 187 mg/dL — ABNORMAL HIGH (ref 65–99)
Glucose, Bld: 150 mg/dL — ABNORMAL HIGH (ref 65–99)
POTASSIUM: 5.2 mmol/L — AB (ref 3.5–5.1)
POTASSIUM: 5.3 mmol/L — AB (ref 3.5–5.1)
POTASSIUM: 4.2 mmol/L (ref 3.5–5.1)
SODIUM: 139 mmol/L (ref 135–145)
SODIUM: 135 mmol/L (ref 135–145)
SODIUM: 136 mmol/L (ref 135–145)

## 2017-05-18 LAB — CBG MONITORING, ED
GLUCOSE-CAPILLARY: 128 mg/dL — AB (ref 65–99)
GLUCOSE-CAPILLARY: 142 mg/dL — AB (ref 65–99)
GLUCOSE-CAPILLARY: 163 mg/dL — AB (ref 65–99)
GLUCOSE-CAPILLARY: 169 mg/dL — AB (ref 65–99)
GLUCOSE-CAPILLARY: 258 mg/dL — AB (ref 65–99)
Glucose-Capillary: 241 mg/dL — ABNORMAL HIGH (ref 65–99)
Glucose-Capillary: 169 mg/dL — ABNORMAL HIGH (ref 65–99)
Glucose-Capillary: 170 mg/dL — ABNORMAL HIGH (ref 65–99)
Glucose-Capillary: 156 mg/dL — ABNORMAL HIGH (ref 65–99)
Glucose-Capillary: 171 mg/dL — ABNORMAL HIGH (ref 65–99)

## 2017-05-18 LAB — I-STAT BETA HCG BLOOD, ED (MC, WL, AP ONLY)

## 2017-05-18 LAB — URINALYSIS, ROUTINE W REFLEX MICROSCOPIC
Bilirubin Urine: NEGATIVE
Glucose, UA: >=500 mg/dL — AB
Ketones, ur: 80 mg/dL — AB
Leukocytes, UA: NEGATIVE
Nitrite: NEGATIVE
Protein, ur: 30 mg/dL — AB
RBC / HPF: >50 RBC/hpf — ABNORMAL HIGH (ref 0–5)
Specific Gravity, Urine: 1.022 (ref 1.005–1.030)
pH: 5 (ref 5.0–8.0)

## 2017-05-18 LAB — CBC
HCT: 48.7 % — ABNORMAL HIGH (ref 36.0–46.0)
Hemoglobin: 16 g/dL — ABNORMAL HIGH (ref 12.0–15.0)
MCH: 28 pg (ref 26.0–34.0)
MCHC: 32.9 g/dL (ref 30.0–36.0)
MCV: 85.1 fL (ref 78.0–100.0)
PLATELETS: 561 10*3/uL — AB (ref 150–400)
RBC: 5.72 MIL/uL — ABNORMAL HIGH (ref 3.87–5.11)
RDW: 14.6 % (ref 11.5–15.5)
WBC: 18 10*3/uL — AB (ref 4.0–10.5)

## 2017-05-18 LAB — I-STAT TROPONIN, ED: Troponin i, poc: 0 ng/mL (ref 0.00–0.08)

## 2017-05-18 LAB — LIPASE, BLOOD: Lipase: 170 U/L — ABNORMAL HIGH (ref 11–51)

## 2017-05-18 MED ORDER — CELECOXIB 200 MG PO CAPS
200.0000 mg | ORAL_CAPSULE | Freq: Two times a day (BID) | ORAL | Status: DC
  Administered 2017-05-19 – 2017-05-21 (×5): 200 mg via ORAL
  Filled 2017-05-18 (×7): qty 1

## 2017-05-18 MED ORDER — GI COCKTAIL ~~LOC~~
30.0000 mL | Freq: Two times a day (BID) | ORAL | Status: DC | PRN
Start: 2017-05-18 — End: 2017-05-21
  Administered 2017-05-18: 30 mL via ORAL
  Filled 2017-05-18: qty 30

## 2017-05-18 MED ORDER — ONDANSETRON HCL 4 MG/2ML IJ SOLN
4.0000 mg | INTRAMUSCULAR | Status: AC
  Administered 2017-05-18 (×2): 4 mg via INTRAVENOUS
  Filled 2017-05-18 (×2): qty 2

## 2017-05-18 MED ORDER — SODIUM CHLORIDE 0.9 % IV SOLN
INTRAVENOUS | Status: AC
  Administered 2017-05-18: 12:00:00 via INTRAVENOUS

## 2017-05-18 MED ORDER — SODIUM CHLORIDE 0.9 % IV SOLN
INTRAVENOUS | Status: AC
  Administered 2017-05-18: 2 [IU]/h via INTRAVENOUS
  Filled 2017-05-18: qty 1

## 2017-05-18 MED ORDER — METOCLOPRAMIDE HCL 5 MG/ML IJ SOLN
10.0000 mg | Freq: Three times a day (TID) | INTRAMUSCULAR | Status: DC
  Administered 2017-05-18 – 2017-05-20 (×7): 10 mg via INTRAVENOUS
  Filled 2017-05-18 (×8): qty 2

## 2017-05-18 MED ORDER — ENOXAPARIN SODIUM 40 MG/0.4ML ~~LOC~~ SOLN
40.0000 mg | SUBCUTANEOUS | Status: DC
  Administered 2017-05-19 – 2017-05-20 (×3): 40 mg via SUBCUTANEOUS
  Filled 2017-05-18 (×5): qty 0.4

## 2017-05-18 MED ORDER — PROMETHAZINE HCL 25 MG/ML IJ SOLN
12.5000 mg | Freq: Once | INTRAMUSCULAR | Status: AC
  Administered 2017-05-18: 12.5 mg via INTRAVENOUS
  Filled 2017-05-18: qty 1

## 2017-05-18 MED ORDER — DEXTROSE-NACL 5-0.45 % IV SOLN
INTRAVENOUS | Status: DC
  Administered 2017-05-18: 75 mL/h via INTRAVENOUS

## 2017-05-18 MED ORDER — MORPHINE SULFATE (PF) 4 MG/ML IV SOLN
2.0000 mg | INTRAVENOUS | Status: AC | PRN
  Administered 2017-05-18 – 2017-05-19 (×2): 2 mg via INTRAVENOUS
  Filled 2017-05-18 (×2): qty 1

## 2017-05-18 MED ORDER — GI COCKTAIL ~~LOC~~
30.0000 mL | Freq: Once | ORAL | Status: AC
  Administered 2017-05-18: 30 mL via ORAL
  Filled 2017-05-18: qty 30

## 2017-05-18 MED ORDER — DEXTROSE-NACL 5-0.45 % IV SOLN: INTRAVENOUS | Status: DC

## 2017-05-18 MED ORDER — SODIUM CHLORIDE 0.9 % IV BOLUS
1000.0000 mL | Freq: Once | INTRAVENOUS | Status: AC
  Administered 2017-05-18: 1000 mL via INTRAVENOUS

## 2017-05-18 MED ORDER — HYDROCODONE-ACETAMINOPHEN 5-325 MG PO TABS
1.0000 | ORAL_TABLET | Freq: Four times a day (QID) | ORAL | Status: DC | PRN
  Administered 2017-05-20: 2 via ORAL
  Filled 2017-05-18: qty 2

## 2017-05-18 MED ORDER — SODIUM CHLORIDE 0.9 % IV SOLN
INTRAVENOUS | Status: DC
  Administered 2017-05-18: 15:00:00 via INTRAVENOUS

## 2017-05-18 MED ORDER — INSULIN REGULAR HUMAN 100 UNIT/ML IJ SOLN
INTRAMUSCULAR | Status: DC
  Filled 2017-05-18: qty 1

## 2017-05-18 MED ORDER — FAMOTIDINE 200 MG/20ML IV SOLN
40.0000 mg | Freq: Two times a day (BID) | INTRAVENOUS | Status: DC
  Administered 2017-05-18 – 2017-05-20 (×5): 40 mg via INTRAVENOUS
  Filled 2017-05-18 (×8): qty 4

## 2017-05-18 MED ORDER — HYDROMORPHONE HCL 2 MG/ML IJ SOLN
1.0000 mg | Freq: Once | INTRAMUSCULAR | Status: AC
  Administered 2017-05-18: 1 mg via INTRAVENOUS
  Filled 2017-05-18: qty 1

## 2017-05-18 MED ORDER — ATORVASTATIN CALCIUM 40 MG PO TABS
40.0000 mg | ORAL_TABLET | Freq: Every evening | ORAL | Status: DC
  Administered 2017-05-19 – 2017-05-20 (×3): 40 mg via ORAL
  Filled 2017-05-18 (×5): qty 1

## 2017-05-18 MED ORDER — SODIUM CHLORIDE 0.9 % IV BOLUS
2000.0000 mL | Freq: Once | INTRAVENOUS | Status: AC
  Administered 2017-05-18: 2000 mL via INTRAVENOUS

## 2017-05-18 MED ORDER — PROMETHAZINE HCL 25 MG/ML IJ SOLN: 6.2500 mg | Freq: Four times a day (QID) | INTRAMUSCULAR | Status: DC | PRN

## 2017-05-18 MED ORDER — METOPROLOL TARTRATE 5 MG/5ML IV SOLN: 5.0000 mg | Freq: Four times a day (QID) | INTRAVENOUS | Status: DC

## 2017-05-18 NOTE — ED Triage Notes (Signed)
Patient presents to the ED from home 8 days post-op low back surgery  with complaints vomiting. Patient reprots has Zofran at home but is not working. Patient reports abdominal pain due to vomiting. Patient reports she was seen here at Saturday for similar complaint. Patient reports last BM 05/11/2017. Patient alert and oriented x4 on arrival/

## 2017-05-18 NOTE — H&P (Addendum)
History and Physical    KARLE DESROSIER JJK:[id number] DOB: 08-Dec-1970 DOA: 05/18/2017  PCP: Marda Stalker, PA-C Patient coming from: home  Chief Complaint: nausea/vomiting/abdominal pain  HPI: [name] is a very pleasant 47 y.o. female with medical history significant for  Insulin-dependent diabetes complicated by diabetic retinopathy bilateral peripheral neuropathy, hyperlipidemia, anxiety/depression, chronic back pain status post recent surgery on L4-L5 presents to the emergency Department chief complaint persistent abdominal pain with intractable nausea and vomiting.initial evaluation reveals DKA elevated lipase. [facility] are asked to admit  nformation is obtained from the patient and the chart. She states she was discharged last week after undergoing a lumbar discectomy. She noted that she developed nausea and vomiting almost immediately after awakening from anesthesia. She was dischargedwith Zofran with little relief. She states during her time at home she has continued to suffer from intractable nausea and vomiting.she had several episodes of emesis that include undigested food and then dry heaving. She reports one episode of "coffee ground" emesis. Husband disagrees and states the emesis was just "dark".  Associated symptoms include decreased oral intake difficulty controlling blood sugar.  In addition she's been unable to take her pain medicines secondary to vomiting. Continues to have sciatica that was presnt before her surgery. She denies headache dizziness syncope or near-syncope. She denies chest pain palpitation shortness of breath dysuria hematuria frequency or urgency. She denies fever chills diarrhea constipation melena bright red blood per rectum.    ED Course: in the emergency department she's afebrile hemodynamically stable with tachycardia and the heart rate 130. She has mild tachypnea is not hypoxic. She is nontoxic appearing. She is provided with 3 L of  normal saline, GI cocktail, Zofran andan insulin drip is initiated. At the time of admission she is sitting up in bed tolerating ice chips is nontoxic appearing  Review of Systems: As per HPI otherwise all other systems reviewed and are negative.   Ambulatory Status:ambulates independently is independent with ADLs  Past Medical History:  Diagnosis Date  . Anxiety   . Depression   . Diabetes mellitus without complication ([place])   . High cholesterol   . Hypertension   . Neuropathy   . PCOS (polycystic ovarian syndrome)     Past Surgical History:  Procedure Laterality Date  . ELBOW SURGERY    . INSERTION OF AHMED VALVE Right 09/07/2015   Procedure: INSERTION OF AHMED VALVE;  Surgeon: Hurman Horn, MD;  Location: [place];  Service: Ophthalmology;  Laterality: Right;  . LASER PHOTO ABLATION Right 09/07/2015   Procedure: LASER PHOTO ABLATION;  Surgeon: Hurman Horn, MD;  Location: [place];  Service: Ophthalmology;  Laterality: Right;  . PARS PLANA VITRECTOMY Right 09/07/2015   Procedure: PARS PLANA VITRECTOMY WITH 25 GAUGE,, with panretinal endolaser,;  Surgeon: Hurman Horn, MD;  Location: [place];  Service: Ophthalmology;  Laterality: Right;    Social History   Socioeconomic History  . Marital status: Married    Spouse name: Not on file  . Number of children: Not on file  . Years of education: Not on file  . Highest education level: Not on file  Occupational History  . Not on file  Social Needs  . Financial resource strain: Not on file  . Food insecurity:    Worry: Not on file    Inability: Not on file  . Transportation needs:    Medical: Not on file    Non-medical: Not on file  Tobacco Use  . Smoking  status: Former Smoker    Types: Cigarettes    Last attempt to quit: 09/12/2000    Years since quitting: 16.6  . Smokeless tobacco: Never Used  Substance and Sexual Activity  . Alcohol use: Yes    Comment: occasional  . Drug use: Yes    Types: Marijuana  . Sexual activity: Yes      Birth control/protection: None, Condom  Lifestyle  . Physical activity:    Days per week: Not on file    Minutes per session: Not on file  . Stress: Not on file  Relationships  . Social connections:    Talks on phone: Not on file    Gets together: Not on file    Attends religious service: Not on file    Active member of club or organization: Not on file    Attends meetings of clubs or organizations: Not on file    Relationship status: Not on file  . Intimate partner violence:    Fear of current or ex partner: Not on file    Emotionally abused: Not on file    Physically abused: Not on file    Forced sexual activity: Not on file  Other Topics Concern  . Not on file  Social History Narrative  . Not on file    Allergies  Allergen Reactions  . No Known Allergies   . Tramadol     Family History  Problem Relation Age of Onset  . Depression Mother   . Anxiety disorder Mother   . Diabetes Mother   . Diabetes Father   . Diabetes Brother     Prior to Admission medications   Medication Sig Start Date End Date Taking? Authorizing Provider  aspirin EC 81 MG tablet Take 81 mg by mouth 2 (two) times daily.   Yes [provider]  atorvastatin (LIPITOR) 40 MG tablet Take 40 mg by mouth every evening. 07/14/15  Yes [provider]  celecoxib (CELEBREX) 200 MG capsule Take 200 mg by mouth 2 (two) times daily.    Yes [provider]  cyclobenzaprine (FLEXERIL) 10 MG tablet Take 10 mg by mouth 3 (three) times daily as needed for muscle spasms.   Yes [provider]  empagliflozin (JARDIANCE) 10 MG TABS tablet Take 10 mg by mouth daily. 05/19/16  Yes Renato Shin, MD  gabapentin (NEURONTIN) 800 MG tablet Take 1,600-3,200 mg by mouth See admin instructions. Take 1600 mg by mouth in the morning and take 3200 mg by mouth in the evening.   Yes [provider]  HYDROcodone-acetaminophen (NORCO/VICODIN) 5-325 MG tablet Take 1-2 tablets by mouth every  6 (six) hours as needed for moderate pain.  08/11/16  Yes [provider]  Insulin Glargine (BASAGLAR KWIKPEN) 100 UNIT/ML SOPN Inject 1.1 mLs (110 Units total) into the skin every morning. And pen needles 3/day 05/19/16  Yes Renato Shin, MD  metFORMIN (GLUCOPHAGE) 1000 MG tablet Take 1,000 mg by mouth 2 (two) times daily with a meal.   Yes [provider]  Multiple Vitamins-Minerals (MULTIVITAMIN GUMMIES ADULT PO) Take 1 capsule by mouth daily.   Yes [provider]  ondansetron (ZOFRAN) 4 MG tablet Take 1 tablet (4 mg total) by mouth every 8 (eight) hours as needed for nausea or vomiting. 05/13/17  Yes Ghimire, Henreitta Leber, MD  timolol (TIMOPTIC) 0.5 % ophthalmic solution Place 1 drop into the right eye 2 (two) times daily.  06/04/15  Yes [provider]  glucose blood (ONE TOUCH ULTRA  TEST) test strip Use as directed once daily to check blood sugar DX E11.9 07/07/16   Renato Shin, MD    Physical Exam: Vitals:   05/18/17 0915 05/18/17 0945 05/18/17 1146 05/18/17 1225  BP: 138/90 140/80 (!) 119/96 (!) 151/88  Pulse:  (!) 126 (!) 125 (!) 128  Resp: 19 20 (!) 23 (!) 22  Temp:      TempSrc:      SpO2:  95% 100% 100%  Weight:      Height:         General:  Appears calm and comfortable sitting up in bed eating ice chips in no acute distress Eyes:  PERRL, EOMI, normal lids, iris ENT:  grossly normal hearing, lips & tongue, mucous membranes of her mouth are pink but quite dry Neck:  no LAD, masses or thyromegaly Cardiovascular:  tachycardia, no m/r/g. No LE edema. Pedal pulses palpable Respiratory:  CTA bilaterally, no w/r/r. Normal respiratory effort. Abdomen:  soft, ntnd, obese bowel sounds are quite sluggish no guarding or rebounding Skin:  no rash or induration seen on limited exam incision lumbar spine well-healed Steri-Strips falling off. No erythema nontender Musculoskeletal:  grossly normal tone BUE/BLE, good ROM, no bony abnormality Psychiatric:   grossly normal mood and affect, speech fluent and appropriate, AOx3 Neurologic:  CN 2-12 grossly intact, moves all extremities in coordinated fashion, sensation intact LE strength 5/5 sensation to LE intact  Labs on Admission: I have personally reviewed following labs and imaging studies  CBC: Recent Labs  Lab 05/12/17 0820 05/13/17 0319 05/18/17 0529  WBC 20.6* 12.0* 18.0*  HGB 13.2 10.3* 16.0*  HCT 39.8 30.9* 48.7*  MCV 86.0 84.7 85.1  PLT 344 281 237*   Basic Metabolic Panel: Recent Labs  Lab 05/12/17 0820 05/12/17 1436 05/13/17 0319 05/18/17 0819 05/18/17 0847  NA 136 137 138 139 139  K 4.8 4.9 4.0 5.7* 5.2*  CL 102 106 111 105 107  CO2 12* 12* 15* 9* 11*  GLUCOSE 197* 195* 128* 300* 286*  BUN _0 23* 23*  CREATININE 1.23* 1.18* 0.89 1.64* 1.67*  CALCIUM 9.1 8.7* 8.0* 9.1 8.5*  MG  --  2.2  --   --   --   PHOS  --  5.5*  --   --   --    GFR: Estimated Creatinine Clearance: 45.9 mL/min (A) (by C-G formula based on SCr of 1.67 mg/dL (H)). Liver Function Tests: Recent Labs  Lab 05/12/17 0820 05/13/17 0319 05/18/17 0819  AST 16 8* 12*  ALT 11* 8* 10*  ALKPHOS 70 47 80  BILITOT 1.4* 0.9 1.5*  PROT 8.0 6.1* 8.2*  ALBUMIN 3.4* 2.4* 3.6   Recent Labs  Lab 05/12/17 0820 05/18/17 0819  LIPASE 26 170*   No results for input(s): AMMONIA in the last 168 hours. Coagulation Profile: No results for input(s): INR, PROTIME in the last 168 hours. Cardiac Enzymes: Recent Labs  Lab 05/12/17 1436  CKTOTAL 98   BNP (last 3 results) No results for input(s): PROBNP in the last 8760 hours. HbA1C: No results for input(s): HGBA1C in the last 72 hours. CBG: Recent Labs  Lab 05/12/17 2022 05/13/17 0003 05/13/17 0529 05/13/17 0737 05/18/17 1200  GLUCAP 106* 114* 113* 121* 258*   Lipid Profile: No results for input(s): CHOL, HDL, LDLCALC, TRIG, CHOLHDL, LDLDIRECT in the last 72 hours. Thyroid Function Tests: No results for input(s): TSH, T4TOTAL, FREET4,  T3FREE, THYROIDAB in the last 72 hours. Anemia Panel: No results  for input(s): VITAMINB12, FOLATE, FERRITIN, TIBC, IRON, RETICCTPCT in the last 72 hours. Urine analysis:    Component Value Date/Time   COLORURINE YELLOW 05/12/2017 0828   APPEARANCEUR CLEAR 05/12/2017 0828   LABSPEC 1.025 05/12/2017 0828   PHURINE 6.0 05/12/2017 0828   GLUCOSEU NEGATIVE 05/12/2017 0828   HGBUR NEGATIVE 05/12/2017 0828   BILIRUBINUR NEGATIVE 05/12/2017 0828   KETONESUR NEGATIVE 05/12/2017 0828   PROTEINUR NEGATIVE 05/12/2017 0828   UROBILINOGEN 0.2 11/28/2013 0023   NITRITE NEGATIVE 05/12/2017 0828   LEUKOCYTESUR NEGATIVE 05/12/2017 0828    Creatinine Clearance: Estimated Creatinine Clearance: 45.9 mL/min (A) (by C-G formula based on SCr of 1.67 mg/dL (H)).  Sepsis Labs: _0 (procalcitonin:4,lacticidven:4) )No results found for this or any previous visit (from the past 240 hour(s)).   Radiological Exams on Admission: Dg Abdomen Acute W/chest  Result Date: 05/18/2017 CLINICAL DATA:  47 year old female postoperative day 9 from lumbar surgery, vomiting and abdominal pain. Constipation. EXAM: DG ABDOMEN ACUTE W/ 1V CHEST COMPARISON:  Intraoperative lumbar radiograph 05/09/2017. Chest and abdominal series 07/27/2015. FINDINGS: PA view of the chest. Stable an normal lung volumes. Normal cardiac size and mediastinal contours. Visualized tracheal air column is within normal limits. The lungs are clear. No pneumothorax or pneumoperitoneum. Upright and supine views of the abdomen and pelvis. Non obstructed bowel gas pattern. Mild to moderate volume of retained stool in the colon, most notably the left colon. Abdominal and pelvic visceral contours appear normal. Stable vertebral height and alignment since 2017. No acute osseous abnormality identified. IMPRESSION: 1.  Normal bowel gas pattern, no free air. 2. Negative chest, no acute cardiopulmonary abnormality. Electronically Signed   By: Genevie Ann M.D.   On:  05/18/2017 08:56   US Abdomen Limited Ruq  Result Date: 05/18/2017 CLINICAL DATA:  Vomiting. EXAM: ULTRASOUND ABDOMEN LIMITED RIGHT UPPER QUADRANT COMPARISON:  None. FINDINGS: Gallbladder: No gallstones or wall thickening visualized. No sonographic Murphy sign noted by sonographer. Sludge is noted within gallbladder lumen. Common bile duct: Diameter: 3.3 mm which is within normal limits. Liver: No focal lesion identified. Within normal limits in parenchymal echogenicity. Portal vein is patent on color Doppler imaging with normal direction of blood flow towards the liver. IMPRESSION: Gallbladder sludge is noted. No other significant abnormality seen in the right upper quadrant of the abdomen. Electronically Signed   By: Marijo Conception, M.D.   On: 05/18/2017 10:24    EKG: personally reviewed.  Sinus tachycardia Prolonged PR interval Prominent P waves, nondiagnostic Anterior infarct, old  Assessment/Plan Principal Problem:   DKA (diabetic ketoacidoses) (HCC) Active Problems:   Type 2 diabetes mellitus not at goal [facility])   Acute kidney injury ([place])   Intractable nausea and vomiting   Hypertension   Neuropathy   Sludge in gallbladder   Hyperkalemia   Abdominal pain   Anxiety   Bipolar affective disorder, depressed, severe (HCC)   Radiculopathy of lumbar region   Tachycardia   #1. DKA. Patient is a type I diabetic. This is likely related to persistent nausea and vomiting decreased oral intake in the setting of recent lumbar discectomy. Serum glucose greater than 300. An ion gap 25. CO2 9. Home medications include Lantus as well as oral agents. In the emergency department she is provided with vigorous IV fluids and insulin drip initiated -admit to step down -Continue vigorous IV fluids -Insulin drip -Be met per protocol -plan to transition when gap closed and co2 within limits of normal -nothing by mouth except ice chips/meds  #2.intractable nausea  and vomiting/abdominal pain/sludge in  gallblader. Likely related to above. Some concern for dark emesis. In the emergency department. Hemoglobin stable.  Right upper quadrant ultrasound reveals sludge without any other abnormality. Paces elevated but LFTs are within the limits of normal. abdominal exam is benign -bowel rest -scheduled Zofran --reglan -IV Phenergan for any breakthrough -Vigorous IV fluids -PPI -GI cocktail -monitor Hg -resolution of #1  #3. Hyperkalemia. Potassium level V.7. EKG as noted above. Likely related to #1. -iv fluids per glucomander protocol -monitor  #4.cute kidney injury. Creatinine 1.6. Related to dehydration the s -Continue IV fluids -Hold any nephrotoxins -Monitor urine output -Recheck in the morning  #5. Hypertension. Pressures on the high end of normal in the emergency department. Home medications include no antihypertensive meds. -Monitor -When necessary hydralazine  #6.tachycardia. Likely related to #1. Heart rate 130 in the emergency department. She is provided with 3 L in the ED. No chest pain. EKG as noted above. Initial troponin within the limits of normal. -Monitor -IV fluids -provide another liter bolus  #7. Lumbar radiculopathy status post discectomy at L4-L5 level one week ago.stable at baseline -Pain management -Robaxin -mobilize  #8. Diabetes. See #1. Home meds include Lantus and oral agents. Recent Hg A1c 8.3.  -plan to resume home regimen per protocol -OP follow up for improved control.   DVT prophylaxis: lovenox  Code Status: full  Family Communication: husband at bedside  Disposition Plan: home  Consults called: none  Admission status: inpatient    Radene Gunning MD [facility]  If 7PM-7AM, please contact night-coverage [email or web address] Password [facility]  05/18/2017, 12:46 PM
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
## 2017-05-19 DIAGNOSIS — R109 Unspecified abdominal pain: Secondary | ICD-10-CM

## 2017-05-19 DIAGNOSIS — R112 Nausea with vomiting, unspecified: Secondary | ICD-10-CM

## 2017-05-19 DIAGNOSIS — E875 Hyperkalemia: Secondary | ICD-10-CM

## 2017-05-19 LAB — GLUCOSE, CAPILLARY
GLUCOSE-CAPILLARY: 150 mg/dL — AB (ref 65–99)
GLUCOSE-CAPILLARY: 141 mg/dL — AB (ref 65–99)
GLUCOSE-CAPILLARY: 130 mg/dL — AB (ref 65–99)
GLUCOSE-CAPILLARY: 138 mg/dL — AB (ref 65–99)
Glucose-Capillary: 131 mg/dL — ABNORMAL HIGH (ref 65–99)

## 2017-05-19 LAB — CBG MONITORING, ED
GLUCOSE-CAPILLARY: 159 mg/dL — AB (ref 65–99)
GLUCOSE-CAPILLARY: 150 mg/dL — AB (ref 65–99)
GLUCOSE-CAPILLARY: 151 mg/dL — AB (ref 65–99)
GLUCOSE-CAPILLARY: 142 mg/dL — AB (ref 65–99)
Glucose-Capillary: 127 mg/dL — ABNORMAL HIGH (ref 65–99)
Glucose-Capillary: 129 mg/dL — ABNORMAL HIGH (ref 65–99)
Glucose-Capillary: 130 mg/dL — ABNORMAL HIGH (ref 65–99)
Glucose-Capillary: 139 mg/dL — ABNORMAL HIGH (ref 65–99)
Glucose-Capillary: 141 mg/dL — ABNORMAL HIGH (ref 65–99)
Glucose-Capillary: 147 mg/dL — ABNORMAL HIGH (ref 65–99)
Glucose-Capillary: 147 mg/dL — ABNORMAL HIGH (ref 65–99)
Glucose-Capillary: 157 mg/dL — ABNORMAL HIGH (ref 65–99)
Glucose-Capillary: 159 mg/dL — ABNORMAL HIGH (ref 65–99)
Glucose-Capillary: 165 mg/dL — ABNORMAL HIGH (ref 65–99)
Glucose-Capillary: 177 mg/dL — ABNORMAL HIGH (ref 65–99)

## 2017-05-19 LAB — BASIC METABOLIC PANEL
ANION GAP: 12 (ref 5–15)
ANION GAP: 10 (ref 5–15)
ANION GAP: 12 (ref 5–15)
Anion gap: 9 (ref 5–15)
BUN: 12 mg/dL (ref 6–20)
BUN: 10 mg/dL (ref 6–20)
BUN: 9 mg/dL (ref 6–20)
BUN: 9 mg/dL (ref 6–20)
CALCIUM: 8 mg/dL — AB (ref 8.9–10.3)
CALCIUM: 8.3 mg/dL — AB (ref 8.9–10.3)
CO2: 13 mmol/L — AB (ref 22–32)
CO2: 17 mmol/L — ABNORMAL LOW (ref 22–32)
CO2: 19 mmol/L — AB (ref 22–32)
CO2: 14 mmol/L — AB (ref 22–32)
CREATININE: 0.9 mg/dL (ref 0.44–1.00)
Calcium: 7.8 mg/dL — ABNORMAL LOW (ref 8.9–10.3)
Calcium: 8.2 mg/dL — ABNORMAL LOW (ref 8.9–10.3)
Chloride: 111 mmol/L (ref 101–111)
Chloride: 108 mmol/L (ref 101–111)
Chloride: 106 mmol/L (ref 101–111)
Chloride: 111 mmol/L (ref 101–111)
Creatinine, Ser: 0.95 mg/dL (ref 0.44–1.00)
Creatinine, Ser: 0.93 mg/dL (ref 0.44–1.00)
Creatinine, Ser: 0.83 mg/dL (ref 0.44–1.00)
GFR calc Af Amer: >60 mL/min (ref >60)
GFR calc non Af Amer: >60 mL/min (ref >60)
GLUCOSE: 151 mg/dL — AB (ref 65–99)
Glucose, Bld: 169 mg/dL — ABNORMAL HIGH (ref 65–99)
Glucose, Bld: 169 mg/dL — ABNORMAL HIGH (ref 65–99)
Glucose, Bld: 145 mg/dL — ABNORMAL HIGH (ref 65–99)
Potassium: 3.8 mmol/L (ref 3.5–5.1)
Potassium: 3.5 mmol/L (ref 3.5–5.1)
Potassium: 3.2 mmol/L — ABNORMAL LOW (ref 3.5–5.1)
Potassium: 3.6 mmol/L (ref 3.5–5.1)
SODIUM: 134 mmol/L — AB (ref 135–145)
Sodium: 136 mmol/L (ref 135–145)
Sodium: 135 mmol/L (ref 135–145)
Sodium: 137 mmol/L (ref 135–145)

## 2017-05-19 LAB — MRSA PCR SCREENING: MRSA by PCR: NEGATIVE

## 2017-05-19 MED ORDER — POTASSIUM CHLORIDE CRYS ER 20 MEQ PO TBCR
20.0000 meq | EXTENDED_RELEASE_TABLET | Freq: Once | ORAL | Status: AC
  Administered 2017-05-19: 20 meq via ORAL
  Filled 2017-05-19: qty 1

## 2017-05-19 NOTE — ED Notes (Signed)
Rate dose change verified with Tray, RN

## 2017-05-19 NOTE — ED Notes (Signed)
Per Dr. Selena Batten , keep insulin @ 0.5 and can increase rate if gluco stabilizer states to increase but do not d/c insulin; will re-evaluate C02 @ next draw later today ; MD states will put in orders for potassium

## 2017-05-19 NOTE — Progress Notes (Signed)
Patient ID: [name], female   DOB: 14-May-1970, 47 y.o.   MRN: [id number]                                                                PROGRESS NOTE                                                                                                                                                                                                             Patient Demographics:    [name], is a 47 y.o. female, DOB - December 31, 1970, [id number]  Admit date - 05/18/2017   Admitting Physician Lahoma Crocker, MD  Outpatient Primary MD for the patient is Loreen Freud  LOS - 1  Outpatient Specialists     Chief Complaint  Patient presents with  . Abdominal Pain  . Post-op Problem  . Emesis       Brief Narrative    47 y.o. female with medical history significant for  Insulin-dependent diabetes complicated by diabetic retinopathy bilateral peripheral neuropathy, hyperlipidemia, anxiety/depression, chronic back pain status post recent surgery on L4-L5 presents to the emergency Department chief complaint persistent abdominal pain with intractable nausea and vomiting.initial evaluation reveals DKA elevated lipase. [facility] are asked to admit  nformation is obtained from the patient and the chart. She states she was discharged last week after undergoing a lumbar discectomy. She noted that she developed nausea and vomiting almost immediately after awakening from anesthesia. She was dischargedwith Zofran with little relief. She states during her time at home she has continued to suffer from intractable nausea and vomiting.she had several episodes of emesis that include undigested food and then dry heaving. She reports one episode of "coffee ground" emesis. Husband disagrees and states the emesis was just "dark".  Associated symptoms include decreased oral intake difficulty controlling blood sugar.  In addition she's been unable to take her pain medicines secondary to  vomiting. Continues to have sciatica that was presnt before her surgery. She denies headache dizziness syncope or near-syncope. She denies chest pain palpitation shortness of breath dysuria hematuria frequency or urgency. She denies fever chills diarrhea constipation melena bright red blood per rectum.    ED Course: in the emergency department she's afebrile hemodynamically stable with tachycardia and the heart rate 130. She has mild  tachypnea is not hypoxic. She is nontoxic appearing. She is provided with 3 L of normal saline, GI cocktail, Zofran andan insulin drip is initiated. At the time of admission she is sitting up in bed tolerating ice chips is nontoxic appearing     Subjective:    [name] today states that n/v improved. Denies fever, chills, cough, cp, palp, sob, abd pain, diarrhea, brbpr, black stool  No headache, No new weakness tingling or numbness..    Assessment  & Plan :    Principal Problem:   DKA (diabetic ketoacidoses) (HCC) Active Problems:   Type 2 diabetes mellitus not at goal [facility])   Anxiety   Bipolar affective disorder, depressed, severe (HCC)   Acute kidney injury (HCC)   Intractable nausea and vomiting   Hypertension   Neuropathy   Sludge in gallbladder   Hyperkalemia   Abdominal pain   Tachycardia   Radiculopathy of lumbar region    #1. DKA. Patient is a type I diabetic. This is likely related to persistent nausea and vomiting decreased oral intake in the setting of recent lumbar discectomy. Serum glucose greater than 300. An ion gap 25. CO2 9. Home medications include Lantus as well as oral agents. In the emergency department she is provided with vigorous IV fluids and insulin drip initiated Cont ns iv Cont insulin drip Bmp pending, if Hco2 normalizing then can transition to [place] insulin  #2.intractable nausea and vomiting/abdominal pain/sludge in gallblader. Likely related to above. Some concern for dark emesis. In the emergency department.  Hemoglobin stable.  Right upper quadrant ultrasound reveals sludge without any other abnormality. Paces elevated but LFTs are within the limits of normal. abdominal exam is benign -bowel rest Cont scheduled Zofran Cont reglan -IV Phenergan for any breakthrough Cont PPI Check cbc in am   #3. Hyperkalemia. Potassium level V.7. EKG as noted above. Likely related to #1. Resolved Check cmp in am  #4.cute kidney injury. Creatinine 1.6. Related to dehydration  Continue IV fluids Hold any nephrotoxins Check cmp in am  #5. Hypertension. Pressures on the high end of normal in the emergency department. Home medications include no antihypertensive meds. Hydralazine prn  #6.tachycardia., improved Likely related to #1. Heart rate 130 in the emergency department. She is provided with 3 L in the ED. No chest pain. EKG as noted above. Initial troponin within the limits of normal. Cont IV fluids  #7. Lumbar radiculopathy status post discectomy at L4-L5 level one week ago.stable at baseline -Pain management -Robaxin -mobilize  #8. Diabetes. See #1. Home meds include Lantus and oral agents. Recent Hg A1c 8.3.  -plan to resume home regimen per protocol -OP follow up for improved control.   DVT prophylaxis: lovenox  Code Status: full  Family Communication:  w patient Disposition Plan: home  Consults called: none  Admission status: inpatient     Lab Results  Component Value Date   PLT 561 (H) 05/18/2017    Antibiotics  : none  Anti-infectives (From admission, onward)   None        Objective:   Vitals:   05/19/17 0445 05/19/17 0729 05/19/17 0730 05/19/17 0830  BP: (!) 110/50 111/63 116/62 (!) 113/56  Pulse: 94 87 88 89  Resp: Temp:      TempSrc:      SpO2: 100% 98% 98% 99%  Weight:      Height:        Wt Readings from Last 3 Encounters:  05/18/17 87.1  kg (192 lb)  05/12/17 92.1 kg (203 lb 0.7 oz)  05/19/16 86.7 kg (191 lb 3.2 oz)      Intake/Output Summary (Last 24 hours) at 05/19/2017 0848 Last data filed at 05/19/2017 0451 Gross per 24 hour  Intake 2099.75 ml  Output -  Net 2099.75 ml     Physical Exam  Awake Alert, Oriented X 3, No new F.N deficits, Normal affect [place].AT,PERRAL Supple Neck,No JVD, No cervical lymphadenopathy appriciated.  Symmetrical Chest wall movement, Good air movement bilaterally, CTAB RRR,No Gallops,Rubs or new Murmurs, No Parasternal Heave +ve B.Sounds, Abd Soft, No tenderness, No organomegaly appriciated, No rebound - guarding or rigidity. No Cyanosis, Clubbing or edema, No new Rash or bruise  Obese     Data Review:    CBC Recent Labs  Lab 05/13/17 0319 05/18/17 0529  WBC 12.0* 18.0*  HGB 10.3* 16.0*  HCT 30.9* 48.7*  PLT 281 561*  MCV 84.7 85.1  MCH 28.2 28.0  MCHC 33.3 32.9  RDW 14.0 14.6    Chemistries  Recent Labs  Lab 05/12/17 1436 05/13/17 0319 05/18/17 0819 05/18/17 0847 05/18/17 1430 05/18/17 2301 05/19/17 0249  NA 137 138 139 139 135 136 136  K 4.9 4.0 5.7* 5.2* 5.3* 4.2 3.8  CL 106 111 105 107 110 113* 111  CO2 12* 15* 9* 11* 8* 15* 13*  GLUCOSE 195* 128* 300* 286* 187* 150* 169*  BUN 16 12 23* 23* 22* 13 12  CREATININE 1.18* 0.89 1.64* 1.67* 1.35* 1.12* 0.95  CALCIUM 8.7* 8.0* 9.1 8.5* 8.0* 7.9* 7.8*  MG 2.2  --   --   --   --   --   --   AST  --  8* 12*  --   --   --   --   ALT  --  8* 10*  --   --   --   --   ALKPHOS  --  47 80  --   --   --   --   BILITOT  --  0.9 1.5*  --   --   --   --    ------------------------------------------------------------------------------------------------------------------ No results for input(s): CHOL, HDL, LDLCALC, TRIG, CHOLHDL, LDLDIRECT in the last 72 hours.  Lab Results  Component Value Date   HGBA1C 8.3 05/19/2016   ------------------------------------------------------------------------------------------------------------------ No results for input(s): TSH, T4TOTAL, T3FREE, THYROIDAB in the  last 72 hours.  Invalid input(s): FREET3 ------------------------------------------------------------------------------------------------------------------ No results for input(s): VITAMINB12, FOLATE, FERRITIN, TIBC, IRON, RETICCTPCT in the last 72 hours.  Coagulation profile No results for input(s): INR, PROTIME in the last 168 hours.  No results for input(s): DDIMER in the last 72 hours.  Cardiac Enzymes No results for input(s): CKMB, TROPONINI, MYOGLOBIN in the last 168 hours.  Invalid input(s): CK ------------------------------------------------------------------------------------------------------------------ No results found for: BNP  Inpatient Medications  Scheduled Meds: . atorvastatin  40 mg Oral QPM  . celecoxib  200 mg Oral BID  . enoxaparin (LOVENOX) injection  40 mg Subcutaneous Q24H  . metoCLOPramide (REGLAN) injection  10 mg Intravenous Q8H   Continuous Infusions: . sodium chloride 150 mL/hr at 05/18/17 1505  . dextrose 5 % and 0.45% NaCl 75 mL/hr (05/18/17 1417)  . famotidine (PEPCID) IV Stopped (05/19/17 0451)  . insulin (NOVOLIN-R) infusion 0.5 Units/hr (05/19/17 0833)   PRN Meds:.gi cocktail, HYDROcodone-acetaminophen, promethazine  Micro Results No results found for this or any previous visit (from the past 240 hour(s)).  Radiology Reports Dg Abdomen Acute W/chest  Result Date: 05/18/2017  CLINICAL DATA:  47 year old female postoperative day 9 from lumbar surgery, vomiting and abdominal pain. Constipation. EXAM: DG ABDOMEN ACUTE W/ 1V CHEST COMPARISON:  Intraoperative lumbar radiograph 05/09/2017. Chest and abdominal series 07/27/2015. FINDINGS: PA view of the chest. Stable an normal lung volumes. Normal cardiac size and mediastinal contours. Visualized tracheal air column is within normal limits. The lungs are clear. No pneumothorax or pneumoperitoneum. Upright and supine views of the abdomen and pelvis. Non obstructed bowel gas pattern. Mild to moderate  volume of retained stool in the colon, most notably the left colon. Abdominal and pelvic visceral contours appear normal. Stable vertebral height and alignment since 2017. No acute osseous abnormality identified. IMPRESSION: 1.  Normal bowel gas pattern, no free air. 2. Negative chest, no acute cardiopulmonary abnormality. Electronically Signed   By: Odessa Fleming M.D.   On: 05/18/2017 08:56   US Abdomen Limited Ruq  Result Date: 05/18/2017 CLINICAL DATA:  Vomiting. EXAM: ULTRASOUND ABDOMEN LIMITED RIGHT UPPER QUADRANT COMPARISON:  None. FINDINGS: Gallbladder: No gallstones or wall thickening visualized. No sonographic Murphy sign noted by sonographer. Sludge is noted within gallbladder lumen. Common bile duct: Diameter: 3.3 mm which is within normal limits. Liver: No focal lesion identified. Within normal limits in parenchymal echogenicity. Portal vein is patent on color Doppler imaging with normal direction of blood flow towards the liver. IMPRESSION: Gallbladder sludge is noted. No other significant abnormality seen in the right upper quadrant of the abdomen. Electronically Signed   By: Lupita Raider, M.D.   On: 05/18/2017 10:24    Time Spent in minutes  30 Critical care time  Pearson Grippe M.D on 05/19/2017 at 8:48 AM  Between 7am to 7pm - Pager - [phone]    After 7pm go to [email or web address] - password [facility]  [facility] -  Office  [phone]

## 2017-05-19 NOTE — ED Notes (Addendum)
Per Dr Selena Batten, keep pt on insulin drip @ 1.1 until new C02 levels come back

## 2017-05-19 NOTE — ED Notes (Signed)
MD Kim notified of BMP, orders to maintain insulin at 0.5 ml/hr.

## 2017-05-19 NOTE — ED Notes (Signed)
House coverage messaged about last BMET results and how to proceed with glucostabilizer. Will continue to monitor.

## 2017-05-19 NOTE — ED Notes (Signed)
CBG=159  

## 2017-05-19 NOTE — ED Notes (Signed)
Per Schorr, co2 must be at least 20 until stabilizer can be d/c'ed. Will continue to monitor.

## 2017-05-19 NOTE — ED Notes (Signed)
CBG 177. 

## 2017-05-20 LAB — CBC
HCT: 30.3 % — ABNORMAL LOW (ref 36.0–46.0)
HEMOGLOBIN: 10.4 g/dL — AB (ref 12.0–15.0)
MCH: 28.3 pg (ref 26.0–34.0)
MCHC: 34.3 g/dL (ref 30.0–36.0)
MCV: 82.3 fL (ref 78.0–100.0)
PLATELETS: 307 10*3/uL (ref 150–400)
RBC: 3.68 MIL/uL — ABNORMAL LOW (ref 3.87–5.11)
RDW: 14 % (ref 11.5–15.5)
WBC: 8.8 10*3/uL (ref 4.0–10.5)

## 2017-05-20 LAB — BASIC METABOLIC PANEL
Anion gap: 9 (ref 5–15)
BUN: 5 mg/dL — AB (ref 6–20)
CHLORIDE: 109 mmol/L (ref 101–111)
CO2: 21 mmol/L — AB (ref 22–32)
CREATININE: 0.73 mg/dL (ref 0.44–1.00)
Calcium: 8.5 mg/dL — ABNORMAL LOW (ref 8.9–10.3)
GFR calc non Af Amer: >60 mL/min (ref >60)
Glucose, Bld: 146 mg/dL — ABNORMAL HIGH (ref 65–99)
POTASSIUM: 3.3 mmol/L — AB (ref 3.5–5.1)
SODIUM: 139 mmol/L (ref 135–145)

## 2017-05-20 LAB — COMPREHENSIVE METABOLIC PANEL
ALBUMIN: 2.6 g/dL — AB (ref 3.5–5.0)
ALT: 9 U/L — ABNORMAL LOW (ref 14–54)
ALT: 10 U/L — ABNORMAL LOW (ref 14–54)
ANION GAP: 11 (ref 5–15)
ANION GAP: 10 (ref 5–15)
AST: 11 U/L — ABNORMAL LOW (ref 15–41)
AST: 11 U/L — AB (ref 15–41)
Albumin: 2.7 g/dL — ABNORMAL LOW (ref 3.5–5.0)
Alkaline Phosphatase: 59 U/L (ref 38–126)
Alkaline Phosphatase: 58 U/L (ref 38–126)
BUN: 6 mg/dL (ref 6–20)
BUN: 7 mg/dL (ref 6–20)
CALCIUM: 8.2 mg/dL — AB (ref 8.9–10.3)
CHLORIDE: 106 mmol/L (ref 101–111)
CHLORIDE: 107 mmol/L (ref 101–111)
CO2: 19 mmol/L — AB (ref 22–32)
CO2: 19 mmol/L — ABNORMAL LOW (ref 22–32)
Calcium: 8.2 mg/dL — ABNORMAL LOW (ref 8.9–10.3)
Creatinine, Ser: 0.67 mg/dL (ref 0.44–1.00)
Creatinine, Ser: 0.69 mg/dL (ref 0.44–1.00)
GFR calc Af Amer: >60 mL/min (ref >60)
GFR calc non Af Amer: >60 mL/min (ref >60)
GFR calc non Af Amer: >60 mL/min (ref >60)
GLUCOSE: 134 mg/dL — AB (ref 65–99)
Glucose, Bld: 157 mg/dL — ABNORMAL HIGH (ref 65–99)
POTASSIUM: 3.1 mmol/L — AB (ref 3.5–5.1)
POTASSIUM: 3.2 mmol/L — AB (ref 3.5–5.1)
SODIUM: 136 mmol/L (ref 135–145)
Sodium: 136 mmol/L (ref 135–145)
TOTAL PROTEIN: 6.1 g/dL — AB (ref 6.5–8.1)
Total Bilirubin: 0.8 mg/dL (ref 0.3–1.2)
Total Bilirubin: 1 mg/dL (ref 0.3–1.2)
Total Protein: 5.7 g/dL — ABNORMAL LOW (ref 6.5–8.1)

## 2017-05-20 LAB — GLUCOSE, CAPILLARY
GLUCOSE-CAPILLARY: 126 mg/dL — AB (ref 65–99)
GLUCOSE-CAPILLARY: 122 mg/dL — AB (ref 65–99)
GLUCOSE-CAPILLARY: 135 mg/dL — AB (ref 65–99)
GLUCOSE-CAPILLARY: 173 mg/dL — AB (ref 65–99)
GLUCOSE-CAPILLARY: 180 mg/dL — AB (ref 65–99)
GLUCOSE-CAPILLARY: 176 mg/dL — AB (ref 65–99)
GLUCOSE-CAPILLARY: 169 mg/dL — AB (ref 65–99)
GLUCOSE-CAPILLARY: 168 mg/dL — AB (ref 65–99)
GLUCOSE-CAPILLARY: 156 mg/dL — AB (ref 65–99)
GLUCOSE-CAPILLARY: 162 mg/dL — AB (ref 65–99)
GLUCOSE-CAPILLARY: 143 mg/dL — AB (ref 65–99)
Glucose-Capillary: 114 mg/dL — ABNORMAL HIGH (ref 65–99)
Glucose-Capillary: 120 mg/dL — ABNORMAL HIGH (ref 65–99)
Glucose-Capillary: 124 mg/dL — ABNORMAL HIGH (ref 65–99)
Glucose-Capillary: 126 mg/dL — ABNORMAL HIGH (ref 65–99)
Glucose-Capillary: 137 mg/dL — ABNORMAL HIGH (ref 65–99)
Glucose-Capillary: 141 mg/dL — ABNORMAL HIGH (ref 65–99)
Glucose-Capillary: 145 mg/dL — ABNORMAL HIGH (ref 65–99)
Glucose-Capillary: 146 mg/dL — ABNORMAL HIGH (ref 65–99)
Glucose-Capillary: 152 mg/dL — ABNORMAL HIGH (ref 65–99)
Glucose-Capillary: 163 mg/dL — ABNORMAL HIGH (ref 65–99)
Glucose-Capillary: 182 mg/dL — ABNORMAL HIGH (ref 65–99)

## 2017-05-20 MED ORDER — KETOROLAC TROMETHAMINE 30 MG/ML IJ SOLN
30.0000 mg | Freq: Four times a day (QID) | INTRAMUSCULAR | Status: DC | PRN
  Administered 2017-05-20: 30 mg via INTRAVENOUS
  Filled 2017-05-20: qty 1

## 2017-05-20 MED ORDER — POTASSIUM CHLORIDE CRYS ER 20 MEQ PO TBCR
40.0000 meq | EXTENDED_RELEASE_TABLET | Freq: Once | ORAL | Status: AC
  Administered 2017-05-20: 40 meq via ORAL
  Filled 2017-05-20: qty 2

## 2017-05-20 MED ORDER — MORPHINE SULFATE (PF) 2 MG/ML IV SOLN: 2.0000 mg | INTRAVENOUS | Status: DC | PRN

## 2017-05-20 MED ORDER — METOCLOPRAMIDE HCL 10 MG PO TABS
10.0000 mg | ORAL_TABLET | Freq: Three times a day (TID) | ORAL | Status: DC
  Administered 2017-05-20 – 2017-05-21 (×2): 10 mg via ORAL
  Filled 2017-05-20 (×3): qty 1

## 2017-05-20 MED ORDER — FAMOTIDINE 20 MG PO TABS
40.0000 mg | ORAL_TABLET | Freq: Two times a day (BID) | ORAL | Status: DC
  Administered 2017-05-20 – 2017-05-21 (×2): 40 mg via ORAL
  Filled 2017-05-20 (×2): qty 2

## 2017-05-20 NOTE — Plan of Care (Signed)
  Problem: Safety: Goal: Ability to remain free from injury will improve Outcome: Progressing Note:  Patient encouraged to call for staff assistance to toilet due to blindness and cords from iv and progressive monitor

## 2017-05-20 NOTE — Progress Notes (Signed)
Patient ID: [name], female   DOB: 02-09-1970, 47 y.o.   MRN: [id number]                                                                PROGRESS NOTE                                                                                                                                                                                                             Patient Demographics:    [name], is a 47 y.o. female, DOB - 10-20-1970, [id number]  Admit date - 05/18/2017   Admitting Physician Lahoma Crocker, MD  Outpatient Primary MD for the patient is Jarrett Soho, PA-C  LOS - 2  Outpatient Specialists:     Chief Complaint  Patient presents with  . Abdominal Pain  . Post-op Problem  . Emesis       Brief Narrative    47 y.o.femalewith medical history significantfor Insulin-dependent diabetes complicated by diabetic retinopathy bilateral peripheral neuropathy, hyperlipidemia, anxiety/depression, chronic back pain status post recent surgery on L4-L5 presents to the emergency Department chief complaint persistent abdominal pain with intractable nausea and vomiting.initial evaluation reveals DKA elevated lipase. [facility] are asked to admit  nformation is obtained from the patient and the chart. She states she was discharged last week after undergoing a lumbar discectomy. She noted that she developed nausea and vomiting almost immediately after awakening from anesthesia. She was dischargedwith Zofran with little relief. She states during her time at home she has continued to suffer from intractable nausea and vomiting.she had several episodes of emesis that include undigested food and then dry heaving. She reports one episode of "coffee ground" emesis. Husband disagrees and states the emesis was just "dark". Associated symptoms include decreased oral intake difficulty controlling blood sugar. In addition she's been unable to take her pain medicines secondary to  vomiting. Continues to have sciatica that was presnt before her surgery. She denies headache dizziness syncope or near-syncope. She denies chest pain palpitation shortness of breath dysuria hematuria frequency or urgency. She denies fever chills diarrhea constipation melena bright red blood per rectum.    ED Course:in the emergency department she's afebrile hemodynamically stable with tachycardia and the heart rate 130. She has mild tachypnea is not hypoxic. She is nontoxic  appearing. She is provided with 3 L of normal saline, GI cocktail, Zofran andan insulin drip is initiated. At the time of admission she is sitting up in bed tolerating ice chips is nontoxic appearing     Subjective:    [name] today has no further n/v, no diarrhea, afebrile. Pt ambulating  No headache, No chest pain, No new weakness tingling or numbness, No Cough - SOB.    Assessment  & Plan :    Principal Problem:   DKA (diabetic ketoacidoses) (HCC) Active Problems:   Type 2 diabetes mellitus not at goal [facility])   Anxiety   Bipolar affective disorder, depressed, severe (HCC)   Acute kidney injury (HCC)   Intractable nausea and vomiting   Hypertension   Neuropathy   Sludge in gallbladder   Hyperkalemia   Abdominal pain   Tachycardia   Radiculopathy of lumbar region    #1. DKA. Patient is a type I diabetic. This is likely related to persistent nausea and vomiting decreased oral intake in the setting of recent lumbar discectomy. Serum glucose greater than 300. An ion gap 25. CO2 9. Home medications include Lantus as well as oral agents. In the emergency department she is provided with vigorous IV fluids and insulin drip initiated Cont ns iv Cont insulin drip Bmp pending, if Hco2 normalizing then can transition to [place] insulin  #2.intractable nausea and vomiting/abdominal pain/sludge in gallblader. Likely related to above. Some concern for dark emesis. In the emergency department. Hemoglobin stable.  Right upper quadrant ultrasound reveals sludge without any other abnormality. Paces elevated but LFTs are within the limits of normal. abdominal exam is benign -bowel rest Cont scheduled Zofran Cont reglan -IV Phenergan for any breakthrough Cont PPI Check cbc in am   #3. Hyperkalemia. Potassium level V.7. EKG as noted above. Likely related to #1. Resolved,  Check cmp in am  #4.cute kidney injury. Creatinine 1.6. Related to dehydration  Continue IV fluids Hold any nephrotoxins Check cmp in am  #5. Hypertension. Pressures on the high end of normal in the emergency department. Home medications include no antihypertensive meds. Hydralazine prn  #6.tachycardia., improved Likely related to #1. Heart rate 130 in the emergency department. She is provided with 3 L in the ED. No chest pain. EKG as noted above. Initial troponin within the limits of normal. Cont IV fluids  #7. Lumbar radiculopathy status post discectomy at L4-L5 level one week ago.stable at baseline -Pain management -Robaxin -mobilize  #8. Diabetes. See #1. Home meds include Lantus and oral agents. Recent Hg A1c 8.3.  -plan to resume home regimen per protocol -OP follow up for improved control.  #9 Hypokalemia Potassium 40 meq po x1 Check cmp in am  #10  Severe protein calorie malnutrition Try prostat once DKA resolved  DVT prophylaxis:lovenox Code Status:full Family Communication:  w patient Disposition Plan:home Consults called:none Admission status:inpatient         Lab Results  Component Value Date   PLT 307 05/20/2017    Antibiotics  : none  Anti-infectives (From admission, onward)   None        Objective:   Vitals:   05/19/17 2027 05/20/17 0002 05/20/17 0423 05/20/17 0814  BP: (!) 168/70 (!) 142/74 (!) 144/75   Pulse: 88 90 88   Resp: Temp: 98.9 F (37.2 C) 98.7 F (37.1 C) 98.4 F (36.9 C) (!) 97.5 F (36.4 C)  TempSrc: Oral Oral Oral Oral    SpO2: 98% 98% 98%  Weight:      Height:        Wt Readings from Last 3 Encounters:  05/18/17 87.1 kg (192 lb)  05/12/17 92.1 kg (203 lb 0.7 oz)  05/19/16 86.7 kg (191 lb 3.2 oz)     Intake/Output Summary (Last 24 hours) at 05/20/2017 1041 Last data filed at 05/20/2017 0414 Gross per 24 hour  Intake 104 ml  Output -  Net 104 ml     Physical Exam  Awake Alert, Oriented X 3, No new F.N deficits, Normal affect [place].AT,PERRAL Supple Neck,No JVD, No cervical lymphadenopathy appriciated.  Symmetrical Chest wall movement, Good air movement bilaterally, CTAB RRR,No Gallops,Rubs or new Murmurs, No Parasternal Heave +ve B.Sounds, Abd Soft, No tenderness, No organomegaly appriciated, No rebound - guarding or rigidity. No Cyanosis, Clubbing or edema, No new Rash or bruise       Data Review:    CBC Recent Labs  Lab 05/18/17 0529 05/20/17 0420  WBC 18.0* 8.8  HGB 16.0* 10.4*  HCT 48.7* 30.3*  PLT 561* 307  MCV 85.1 82.3  MCH 28.0 28.3  MCHC 32.9 34.3  RDW 14.6 14.0    Chemistries  Recent Labs  Lab 05/18/17 0819  05/19/17 0249 05/19/17 0924 05/19/17 1400 05/19/17 1857 05/20/17 0420  NA 139   < > 136 135 134* 137 136  K 5.7*   < > 3.8 3.5 3.2* 3.6 3.1*  CL 105   < > 111 108 106 111 106  CO2 9*   < > 13* 17* 19* 14* 19*  GLUCOSE 300*   < > 169* 169* 145* 151* 134*  BUN 23*   < > CREATININE 1.64*   < > 0.95 0.93 0.90 0.83 0.67  CALCIUM 9.1   < > 7.8* 8.0* 8.3* 8.2* 8.2*  AST 12*  --   --   --   --   --  11*  ALT 10*  --   --   --   --   --  9*  ALKPHOS 80  --   --   --   --   --  59  BILITOT 1.5*  --   --   --   --   --  0.8   < > = values in this interval not displayed.   ------------------------------------------------------------------------------------------------------------------ No results for input(s): CHOL, HDL, LDLCALC, TRIG, CHOLHDL, LDLDIRECT in the last 72 hours.  Lab Results  Component Value Date   HGBA1C 8.3 05/19/2016    ------------------------------------------------------------------------------------------------------------------ No results for input(s): TSH, T4TOTAL, T3FREE, THYROIDAB in the last 72 hours.  Invalid input(s): FREET3 ------------------------------------------------------------------------------------------------------------------ No results for input(s): VITAMINB12, FOLATE, FERRITIN, TIBC, IRON, RETICCTPCT in the last 72 hours.  Coagulation profile No results for input(s): INR, PROTIME in the last 168 hours.  No results for input(s): DDIMER in the last 72 hours.  Cardiac Enzymes No results for input(s): CKMB, TROPONINI, MYOGLOBIN in the last 168 hours.  Invalid input(s): CK ------------------------------------------------------------------------------------------------------------------ No results found for: BNP  Inpatient Medications  Scheduled Meds: . atorvastatin  40 mg Oral QPM  . celecoxib  200 mg Oral BID  . enoxaparin (LOVENOX) injection  40 mg Subcutaneous Q24H  . metoCLOPramide (REGLAN) injection  10 mg Intravenous Q8H   Continuous Infusions: . sodium chloride 150 mL/hr at 05/18/17 1505  . dextrose 5 % and 0.45% NaCl 75 mL/hr (05/18/17 1417)  . famotidine (PEPCID) IV 40 mg (05/20/17 1006)  . insulin (NOVOLIN-R) infusion 0.5 Units/hr (05/20/17  0129)   PRN Meds:.gi cocktail, HYDROcodone-acetaminophen, ketorolac, morphine injection, promethazine  Micro Results Recent Results (from the past 240 hour(s))  MRSA PCR Screening     Status: None   Collection Time: 05/19/17  6:57 PM  Result Value Ref Range Status   MRSA by PCR NEGATIVE NEGATIVE Final    Comment:        The GeneXpert MRSA Assay (FDA approved for NASAL specimens only), is one component of a comprehensive MRSA colonization surveillance program. It is not intended to diagnose MRSA infection nor to guide or monitor treatment for MRSA infections. Performed at [facility], [address],  [place], [place] [address]     Radiology Reports Dg Abdomen Acute W/chest  Result Date: 05/18/2017 CLINICAL DATA:  47 year old female postoperative day 9 from lumbar surgery, vomiting and abdominal pain. Constipation. EXAM: DG ABDOMEN ACUTE W/ 1V CHEST COMPARISON:  Intraoperative lumbar radiograph 05/09/2017. Chest and abdominal series 07/27/2015. FINDINGS: PA view of the chest. Stable an normal lung volumes. Normal cardiac size and mediastinal contours. Visualized tracheal air column is within normal limits. The lungs are clear. No pneumothorax or pneumoperitoneum. Upright and supine views of the abdomen and pelvis. Non obstructed bowel gas pattern. Mild to moderate volume of retained stool in the colon, most notably the left colon. Abdominal and pelvic visceral contours appear normal. Stable vertebral height and alignment since 2017. No acute osseous abnormality identified. IMPRESSION: 1.  Normal bowel gas pattern, no free air. 2. Negative chest, no acute cardiopulmonary abnormality. Electronically Signed   By: Odessa Fleming M.D.   On: 05/18/2017 08:56   US Abdomen Limited Ruq  Result Date: 05/18/2017 CLINICAL DATA:  Vomiting. EXAM: ULTRASOUND ABDOMEN LIMITED RIGHT UPPER QUADRANT COMPARISON:  None. FINDINGS: Gallbladder: No gallstones or wall thickening visualized. No sonographic Murphy sign noted by sonographer. Sludge is noted within gallbladder lumen. Common bile duct: Diameter: 3.3 mm which is within normal limits. Liver: No focal lesion identified. Within normal limits in parenchymal echogenicity. Portal vein is patent on color Doppler imaging with normal direction of blood flow towards the liver. IMPRESSION: Gallbladder sludge is noted. No other significant abnormality seen in the right upper quadrant of the abdomen. Electronically Signed   By: Lupita Raider, M.D.   On: 05/18/2017 10:24    Time Spent in minutes  30   Pearson Grippe M.D on 05/20/2017 at 10:41 AM  Between 7am to 7pm - Pager - [phone]      After 7pm go to [email or web address] - password [facility]  [facility] -  Office  [phone]

## 2017-05-20 NOTE — Progress Notes (Signed)
As per Dr. Selena Batten.  Do not put isulin drip under 1.0 unit per hour.  She needs insulin to get her CO2 level up.  May bump fluids D5 1/2NS up to 155ml/hr if CBGs start to drop.

## 2017-05-21 DIAGNOSIS — N179 Acute kidney failure, unspecified: Secondary | ICD-10-CM

## 2017-05-21 DIAGNOSIS — F314 Bipolar disorder, current episode depressed, severe, without psychotic features: Secondary | ICD-10-CM

## 2017-05-21 DIAGNOSIS — F419 Anxiety disorder, unspecified: Secondary | ICD-10-CM

## 2017-05-21 LAB — CBC
HCT: 34.5 % — ABNORMAL LOW (ref 36.0–46.0)
Hemoglobin: 12.3 g/dL (ref 12.0–15.0)
MCH: 28.7 pg (ref 26.0–34.0)
MCHC: 35.7 g/dL (ref 30.0–36.0)
MCV: 80.4 fL (ref 78.0–100.0)
Platelets: 348 10*3/uL (ref 150–400)
RBC: 4.29 MIL/uL (ref 3.87–5.11)
RDW: 13.7 % (ref 11.5–15.5)
WBC: 7.3 10*3/uL (ref 4.0–10.5)

## 2017-05-21 LAB — COMPREHENSIVE METABOLIC PANEL
ALBUMIN: 2.9 g/dL — AB (ref 3.5–5.0)
ALT: UNDETERMINED U/L (ref 14–54)
ANION GAP: 11 (ref 5–15)
AST: 13 U/L — AB (ref 15–41)
Alkaline Phosphatase: 62 U/L (ref 38–126)
BUN: 6 mg/dL (ref 6–20)
CHLORIDE: 106 mmol/L (ref 101–111)
CO2: 22 mmol/L (ref 22–32)
Calcium: 8.4 mg/dL — ABNORMAL LOW (ref 8.9–10.3)
Creatinine, Ser: 0.66 mg/dL (ref 0.44–1.00)
GFR calc Af Amer: >60 mL/min (ref >60)
GFR calc non Af Amer: >60 mL/min (ref >60)
GLUCOSE: 109 mg/dL — AB (ref 65–99)
POTASSIUM: 3.1 mmol/L — AB (ref 3.5–5.1)
SODIUM: 139 mmol/L (ref 135–145)
Total Bilirubin: UNDETERMINED mg/dL (ref 0.3–1.2)
Total Protein: 6.3 g/dL — ABNORMAL LOW (ref 6.5–8.1)

## 2017-05-21 LAB — GLUCOSE, CAPILLARY
GLUCOSE-CAPILLARY: 108 mg/dL — AB (ref 65–99)
GLUCOSE-CAPILLARY: 110 mg/dL — AB (ref 65–99)
GLUCOSE-CAPILLARY: 123 mg/dL — AB (ref 65–99)
GLUCOSE-CAPILLARY: 132 mg/dL — AB (ref 65–99)
Glucose-Capillary: 104 mg/dL — ABNORMAL HIGH (ref 65–99)
Glucose-Capillary: 110 mg/dL — ABNORMAL HIGH (ref 65–99)
Glucose-Capillary: 110 mg/dL — ABNORMAL HIGH (ref 65–99)
Glucose-Capillary: 116 mg/dL — ABNORMAL HIGH (ref 65–99)
Glucose-Capillary: 143 mg/dL — ABNORMAL HIGH (ref 65–99)
Glucose-Capillary: 146 mg/dL — ABNORMAL HIGH (ref 65–99)

## 2017-05-21 MED ORDER — POTASSIUM CHLORIDE CRYS ER 20 MEQ PO TBCR: 40.0000 meq | EXTENDED_RELEASE_TABLET | Freq: Once | ORAL | Status: DC

## 2017-05-21 MED ORDER — INSULIN LISPRO 100 UNIT/ML ~~LOC~~ SOLN: SUBCUTANEOUS | 0 refills | Status: DC

## 2017-05-21 MED ORDER — INSULIN ASPART 100 UNIT/ML ~~LOC~~ SOLN: 0.0000 [IU] | Freq: Three times a day (TID) | SUBCUTANEOUS | Status: DC

## 2017-05-21 MED ORDER — INSULIN DETEMIR 100 UNIT/ML ~~LOC~~ SOLN
5.0000 [IU] | Freq: Every day | SUBCUTANEOUS | Status: DC
  Administered 2017-05-21: 5 [IU] via SUBCUTANEOUS
  Filled 2017-05-21: qty 0.05

## 2017-05-21 NOTE — Plan of Care (Signed)
  Problem: Coping: Goal: Level of anxiety will decrease Outcome: Progressing   Patient voices eagerness to discharge once co2 is appropriate level. AM labs show co2 at 22

## 2017-05-21 NOTE — Progress Notes (Addendum)
Inpatient Diabetes Program Recommendations  AACE/ADA: New Consensus Statement on Inpatient Glycemic Control (2019)  Target Ranges:  Prepandial:   less than 140 mg/dL      Peak postprandial:   less than 180 mg/dL (1-2 hours)      Critically ill patients:  140 - 180 mg/dL   Results for [name], [name] (MRN [id number]) as of 05/21/2017 10:49  Ref. Range 05/21/2017 04:37 05/21/2017 05:33 05/21/2017 06:30 05/21/2017 07:38 05/21/2017 08:31  Glucose-Capillary Latest Ref Range: 65 - 99 mg/dL 409 (H) 811 (H) 914 (H) 132 (H) 143 (H)    Results for [name], [name] (MRN [id number]) as of 05/21/2017 10:49  Ref. Range 05/20/2017 18:54 05/21/2017 05:03  CO2 Latest Ref Range: 22 - 32 mmol/L 21 (L) 22  Results for [name], [name] (MRN [id number]) as of 05/21/2017 10:49  Ref. Range 05/20/2017 18:54 05/21/2017 05:03  Anion gap Latest Ref Range: 5 - Results for [name], [name] (MRN [id number]) as of 05/21/2017 10:49  Ref. Range 07/20/2013 06:29 05/19/2016 09:55  Hemoglobin A1C Unknown 13.2 (H) 8.3   Review of Glycemic Control  Outpatient Diabetes medications: Basaglar 40-80 units QAM, Jardiance 10 mg daily, Metformin 1000 mg BID Current orders for Inpatient glycemic control: Levemir 5 units daily, Novolog 0-9 units TID with meals  Inpatient Diabetes Program Recommendations: Insulin - Basal: Anticipate patient will need more basal insulin than what is currently ordered. May want to consider increasing Levemir to 13 units daily (based on 87 kg x 0.15 units). Insulin-Correction: Please consider changing frequency of CBGs and Novolog 0-9 units to Q4H to allow more frequent monitoring and correction if needed. Oral Agents: Per home medication list, patient takes London Pepper as an outpatient. London Pepper can put patient at increased risk of DKA which usually takes several days on IV insulin to clear. Recommend discontinuing Jardiance as an outpatient and have patient follow up with PCP or Endocrinology.  A1C:  Please consider ordering an A1C to evaluate glycemic control over the past 2-3 months.  NOTE: Noted patient has seen Dr. Everardo All (Endocrinologist) in the past and last office visit with Dr. Everardo All was on 05/19/2016 (over 1 year ago). Patient was admitted on 05/12/17 with N/V and acute kidney injury. Noted from labs on 05/12/17 patient was acidotic at that time as well (CO2 12, Anion Gap 22, and glucose 197 mg/dl). Patient was discharged on 05/13/17 and presented back to the hospital on 05/18/17 with recurrent N/V. Patient was admitted on 05/18/17 with DKA; initial CO2 was 9, Anion Gap 25 and glucose 300 mg/dl on 2/44/01. Noted patient is taking Jardiance as an outpatient. London Pepper is a SGLT2 inhibitor which can put patient at increased risk for DKA. From the cases of DKA with SGLT2 inhibitors that have been seen by Inpatient Diabetes team, IV insulin is being required several days along with increased calorie intake to clear acidosis. Patient has been on IV insulin since 05/18/17 and is being transition to SQ insulin today. Recommend Jardiance be discontinued as an outpatient and have patient follow up with PCP or Endocrinology.   Addendum 05/21/17@13 :27-Spoke with patient about diabetes and home regimen for diabetes control. Patient reports that she is followed by PCP for diabetes management. Patient states that she was seeing Dr. Everardo All in the past but she does not plan to see him any longer. Patient states that she takes Basaglar 80 units QAM at 5am, Jardiance 10 mg daily, and Metformin 1000 mg BID as an outpatient for diabetes  control. Patient reports that she is taking insulin as prescribed. Patient states that her glucose runs well (in the 100's mg/dl) and her Y7W is 2.9%. Patient reports that she has been on Jardiance for a long time. Discussed risk of DKA with Jardiance and how Jardiance works to decrease available glucose. Inquired about ever adjusting Basaglar and patient states that she does adjust  Basaglar dose based on how her sugar is running and plans for the day. Patient reports that she takes between 40-80 units QAM.  Discussed Jardiance further and explained that since it decreases amount of available glucose (makes glucose levels look good) if she does not take enough insulin then the body cells can't adequately use the glucose that is available which can lead to breakdown of fat and muscle which can lead to DKA.  Patient states that she is going to be discharged home today. Patient states that she was told by the doctor not to take any Basaglar until tomorrow. Informed patient that she only received Levemir 5 units today at time of transition from IV insulin drip. Encouraged patient to keep a close check on her glucose levels. Encouraged patient to find a new Endocrinologist that she has a good relationship with so they can assist with DM control.  Patient verbalized understanding of information discussed and she states that she has no further questions at this time related to diabetes.  Thanks, Orlando Penner, RN, MSN, CDE Diabetes Coordinator Inpatient Diabetes Program [phone] (Team Pager from 8am to 5pm)

## 2017-05-21 NOTE — Discharge Summary (Addendum)
Physician Discharge Summary  [name]:[id number] DOB: 04/05/1970 DOA: 05/18/2017  PCP: Jarrett Soho, PA-C  Admit date: 05/18/2017 Discharge date: 05/21/2017  Admitted From: Home  Disposition:  Home   Recommendations for Outpatient Follow-up and new medication changes:  1. Follow up with PCP in 1- week 2. Discontinue empagliflozin 3. Patient placed on a insulin sliding scale for Insulin Aspart.   Home Health: No  Equipment/Devices: no    Discharge Condition: stable  CODE STATUS: full  Diet recommendation:  Heart healthy and diabetic prudent.   Brief/Interim Summary: 47 year old female who presented to the hospital with abdominal pain, intractable nausea and vomiting.  She does have the significant past medical history of type 2 diabetes mellitus, diabetic nephropathy and retinopathy, dyslipidemia, anxiety/depression and chronic back pain.  She had a recent L4-L5 surgery, with postoperative persistent nausea and vomiting.  She had a short hospitalization from May 11 to May 12 for intractable nausea that improved with antiemetics.  At home she had recurrent and persistent symptoms.  On initial physical examination blood pressure 168/70, heart rate 88, respiratory rate 16, temperature 98.9, oxygen saturation 98%, dry mucous membranes, lungs clear to auscultation bilaterally, heart S1-S2 present rhythmic, abdomen soft nontender, no lower extremity edema.  Sodium 139, potassium 5.7, chloride 95, bicarb 9, glucose 300, BUN 23, creatinine 1.64, calcium 9.1, anion gap 25, white count 18.0, hemoglobin 16.0, hematocrit 48.7, platelets 561.  Urinalysis greater than 500 glucose, protein 30, specific gravity 1.022, white cells 0-5, RBC greater than 50.  Chest x-ray negative for infiltrates, abdominal ultrasonography with gallbladder with sludge.  EKG normal sinus rhythm, normal axis, normal intervals.  Patient was admitted to the hospital with the working diagnosis of diabetes  ketoacidosis.  1.  Diabetes ketoacidosis.  Patient was admitted to the stepdown unit, she was placed on a continuous infusion of insulin, frequent metabolic panel monitoring, IV fluids, and IV antiemetics.  She responded well to medical therapy, she was successfully transitioned to subcutaneous insulin.  Her capillary glucose 143 and 146.  Discharge on gap 11, serum bicarbonate 22 and potassium 3.1.  Potassium has been repleted before discharge, recommend outpatient follow-up.   2.  Type 1 diabetes mellitus.  Patient will be discharged home to continue metformin and insulin glargine.  Due to diabetes ketoacidosis, empagliflozin will be discontinued.  Patient on high dose of insulin glargine, 110 units daily.  Will add insulin sliding scale for glucose coverage and monitoring, follow-up as an outpatient.   3.  Acute kidney injury.  Due to diabetes ketoacidosis.  Patient responded well to IV fluids, discharge creatinine 0.66 with a serum bicarbonate of 22 and anion gap of 11.   4.  Lumbar radiculopathy postvasectomy L4-L5.  Continue pain control with analgesics control and muscle relaxants.   5.  Calorie protein malnutrition.  Continue nutritional supplements.  Discharge Diagnoses:  Principal Problem:   DKA (diabetic ketoacidoses) (HCC) Active Problems:   Type 2 diabetes mellitus not at goal [facility])   Anxiety   Bipolar affective disorder, depressed, severe (HCC)   Acute kidney injury (HCC)   Intractable nausea and vomiting   Hypertension   Neuropathy   Sludge in gallbladder   Hyperkalemia   Abdominal pain   Tachycardia   Radiculopathy of lumbar region    Discharge Instructions   Allergies as of 05/21/2017      Reactions   No Known Allergies    Tramadol       Medication List    STOP taking these  medications   empagliflozin 10 MG Tabs tablet Commonly known as:  JARDIANCE     TAKE these medications   aspirin EC 81 MG tablet Take 81 mg by mouth 2 (two) times daily.    atorvastatin 40 MG tablet Commonly known as:  LIPITOR Take 40 mg by mouth every evening.   BASAGLAR KWIKPEN 100 UNIT/ML Sopn Inject 1.1 mLs (110 Units total) into the skin every morning. And pen needles 3/day   celecoxib 200 MG capsule Commonly known as:  CELEBREX Take 200 mg by mouth 2 (two) times daily.   cyclobenzaprine 10 MG tablet Commonly known as:  FLEXERIL Take 10 mg by mouth 3 (three) times daily as needed for muscle spasms.   gabapentin 800 MG tablet Commonly known as:  NEURONTIN Take 1,600-3,200 mg by mouth See admin instructions. Take 1600 mg by mouth in the morning and take 3200 mg by mouth in the evening.   glucose blood test strip Commonly known as:  ONE TOUCH ULTRA TEST Use as directed once daily to check blood sugar DX E11.9   HYDROcodone-acetaminophen 5-325 MG tablet Commonly known as:  NORCO/VICODIN Take 1-2 tablets by mouth every 6 (six) hours as needed for moderate pain.   insulin lispro 100 UNIT/ML injection Commonly known as:  HUMALOG For glucose 150-200 use two units, for 201 to 250 use four units, for 251-300 use six units, for 301 to 350 use eight units, for 351 or greater use ten units.   metFORMIN 1000 MG tablet Commonly known as:  GLUCOPHAGE Take 1,000 mg by mouth 2 (two) times daily with a meal.   MULTIVITAMIN GUMMIES ADULT PO Take 1 capsule by mouth daily.   ondansetron 4 MG tablet Commonly known as:  ZOFRAN Take 1 tablet (4 mg total) by mouth every 8 (eight) hours as needed for nausea or vomiting.   timolol 0.5 % ophthalmic solution Commonly known as:  TIMOPTIC Place 1 drop into the right eye 2 (two) times daily.       Allergies  Allergen Reactions  . No Known Allergies   . Tramadol     Consultations:     Procedures/Studies: Dg Abdomen Acute W/chest  Result Date: 05/18/2017 CLINICAL DATA:  47 year old female postoperative day 9 from lumbar surgery, vomiting and abdominal pain. Constipation. EXAM: DG ABDOMEN ACUTE W/ 1V  CHEST COMPARISON:  Intraoperative lumbar radiograph 05/09/2017. Chest and abdominal series 07/27/2015. FINDINGS: PA view of the chest. Stable an normal lung volumes. Normal cardiac size and mediastinal contours. Visualized tracheal air column is within normal limits. The lungs are clear. No pneumothorax or pneumoperitoneum. Upright and supine views of the abdomen and pelvis. Non obstructed bowel gas pattern. Mild to moderate volume of retained stool in the colon, most notably the left colon. Abdominal and pelvic visceral contours appear normal. Stable vertebral height and alignment since 2017. No acute osseous abnormality identified. IMPRESSION: 1.  Normal bowel gas pattern, no free air. 2. Negative chest, no acute cardiopulmonary abnormality. Electronically Signed   By: Odessa Fleming M.D.   On: 05/18/2017 08:56   US Abdomen Limited Ruq  Result Date: 05/18/2017 CLINICAL DATA:  Vomiting. EXAM: ULTRASOUND ABDOMEN LIMITED RIGHT UPPER QUADRANT COMPARISON:  None. FINDINGS: Gallbladder: No gallstones or wall thickening visualized. No sonographic Murphy sign noted by sonographer. Sludge is noted within gallbladder lumen. Common bile duct: Diameter: 3.3 mm which is within normal limits. Liver: No focal lesion identified. Within normal limits in parenchymal echogenicity. Portal vein is patent on color Doppler imaging with normal  direction of blood flow towards the liver. IMPRESSION: Gallbladder sludge is noted. No other significant abnormality seen in the right upper quadrant of the abdomen. Electronically Signed   By: Lupita Raider, M.D.   On: 05/18/2017 10:24       Subjective: Patient is feeling better, no nausea or vomiting, no chest pain or dyspnea.   Discharge Exam: Vitals:   05/21/17 0023 05/21/17 0423  BP: (!) 147/67 (!) 147/81  Pulse: 80 79  Resp: 16 13  Temp: 98.4 F (36.9 C) 98.6 F (37 C)  SpO2: 97% 100%   Vitals:   05/20/17 1225 05/20/17 2045 05/21/17 0023 05/21/17 0423  BP:  127/75 (!)  147/67 (!) 147/81  Pulse:  85 80 79  Resp:  Temp: 98.2 F (36.8 C) 98.6 F (37 C) 98.4 F (36.9 C) 98.6 F (37 C)  TempSrc: Oral Oral Oral Oral  SpO2:  100% 97% 100%  Weight:      Height:        General: Not in pain or dyspnea, Neurology: Awake and alert, non focal  E ENT: no pallor, no icterus, oral mucosa moist Cardiovascular: No JVD. S1-S2 present, rhythmic, no gallops, rubs, or murmurs. No lower extremity edema. Pulmonary: vesicular breath sounds bilaterally, adequate air movement, no wheezing, rhonchi or rales. Gastrointestinal. Abdomen with no organomegaly, non tender, no rebound or guarding Skin. No rashes Musculoskeletal: no joint deformities   The results of significant diagnostics from this hospitalization (including imaging, microbiology, ancillary and laboratory) are listed below for reference.     Microbiology: Recent Results (from the past 240 hour(s))  MRSA PCR Screening     Status: None   Collection Time: 05/19/17  6:57 PM  Result Value Ref Range Status   MRSA by PCR NEGATIVE NEGATIVE Final    Comment:        The GeneXpert MRSA Assay (FDA approved for NASAL specimens only), is one component of a comprehensive MRSA colonization surveillance program. It is not intended to diagnose MRSA infection nor to guide or monitor treatment for MRSA infections. Performed at [facility], [address] [place], [place] [address]      Labs: BNP (last 3 results) No results for input(s): BNP in the last 8760 hours. Basic Metabolic Panel: Recent Labs  Lab 05/19/17 1857 05/20/17 0420 05/20/17 1508 05/20/17 1854 05/21/17 0503  NA 137 136 136 139 139  K 3.6 3.1* 3.2* 3.3* 3.1*  CL 111 106 107 109 106  CO2 14* 19* 19* 21* 22  GLUCOSE 151* 134* 157* 146* 109*  BUN 5* 6  CREATININE 0.83 0.67 0.69 0.73 0.66  CALCIUM 8.2* 8.2* 8.2* 8.5* 8.4*   Liver Function Tests: Recent Labs  Lab 05/18/17 0819 05/20/17 0420 05/20/17 1508  05/21/17 0503  AST 12* 11* 11* 13*  ALT 10* 9* 10* QUANTITY NOT SUFFICIENT, UNABLE TO PERFORM TEST  ALKPHOS 80 59 58 62  BILITOT 1.5* 0.8 1.0 QUANTITY NOT SUFFICIENT, UNABLE TO PERFORM TEST  PROT 8.2* 5.7* 6.1* 6.3*  ALBUMIN 3.6 2.6* 2.7* 2.9*   Recent Labs  Lab 05/18/17 0819  LIPASE 170*   No results for input(s): AMMONIA in the last 168 hours. CBC: Recent Labs  Lab 05/18/17 0529 05/20/17 0420 05/21/17 0503  WBC 18.0* 8.8 7.3  HGB 16.0* 10.4* 12.3  HCT 48.7* 30.3* 34.5*  MCV 85.1 82.3 80.4  PLT 561* 307 348   Cardiac Enzymes: No results for input(s): CKTOTAL, CKMB,  CKMBINDEX, TROPONINI in the last 168 hours. BNP: Invalid input(s): POCBNP CBG: Recent Labs  Lab 05/21/17 0533 05/21/17 0630 05/21/17 0738 05/21/17 0831 05/21/17 1213  GLUCAP 110* 116* 132* 143* 146*   D-Dimer No results for input(s): DDIMER in the last 72 hours. Hgb A1c No results for input(s): HGBA1C in the last 72 hours. Lipid Profile No results for input(s): CHOL, HDL, LDLCALC, TRIG, CHOLHDL, LDLDIRECT in the last 72 hours. Thyroid function studies No results for input(s): TSH, T4TOTAL, T3FREE, THYROIDAB in the last 72 hours.  Invalid input(s): FREET3 Anemia work up No results for input(s): VITAMINB12, FOLATE, FERRITIN, TIBC, IRON, RETICCTPCT in the last 72 hours. Urinalysis    Component Value Date/Time   COLORURINE STRAW (A) 05/18/2017 1237   APPEARANCEUR CLEAR 05/18/2017 1237   LABSPEC 1.022 05/18/2017 1237   PHURINE 5.0 05/18/2017 1237   GLUCOSEU >=500 (A) 05/18/2017 1237   HGBUR LARGE (A) 05/18/2017 1237   BILIRUBINUR NEGATIVE 05/18/2017 1237   KETONESUR 80 (A) 05/18/2017 1237   PROTEINUR 30 (A) 05/18/2017 1237   UROBILINOGEN 0.2 11/28/2013 0023   NITRITE NEGATIVE 05/18/2017 1237   LEUKOCYTESUR NEGATIVE 05/18/2017 1237   Sepsis Labs Invalid input(s): PROCALCITONIN,  WBC,  LACTICIDVEN Microbiology Recent Results (from the past 240 hour(s))  MRSA PCR Screening     Status: None    Collection Time: 05/19/17  6:57 PM  Result Value Ref Range Status   MRSA by PCR NEGATIVE NEGATIVE Final    Comment:        The GeneXpert MRSA Assay (FDA approved for NASAL specimens only), is one component of a comprehensive MRSA colonization surveillance program. It is not intended to diagnose MRSA infection nor to guide or monitor treatment for MRSA infections. Performed at [facility], [address] [place], [place] [address]      Time coordinating discharge: 45 minutes  SIGNED:   Coralie Keens, MD  [facility] 05/21/2017, 1:15 PM Pager [phone]  If 7PM-7AM, please contact night-coverage [email or web address] Password [email or web address]

## 2017-05-31 ENCOUNTER — Other Ambulatory Visit: Payer: Self-pay | Admitting: Endocrinology

## 2017-06-04 ENCOUNTER — Other Ambulatory Visit: Payer: Self-pay | Admitting: Endocrinology

## 2017-06-07 ENCOUNTER — Other Ambulatory Visit: Payer: Self-pay

## 2017-06-07 ENCOUNTER — Emergency Department (HOSPITAL_COMMUNITY): Payer: 59

## 2017-06-07 ENCOUNTER — Encounter (HOSPITAL_COMMUNITY): Payer: Self-pay | Admitting: Emergency Medicine

## 2017-06-07 ENCOUNTER — Emergency Department (HOSPITAL_COMMUNITY)
Admission: EM | Admit: 2017-06-07 | Discharge: 2017-06-07 | Disposition: A | Discharge status: Home / Self Care | Payer: 59 | Attending: Emergency Medicine | Admitting: Emergency Medicine

## 2017-06-07 DIAGNOSIS — Z794 Long term (current) use of insulin: Secondary | ICD-10-CM | POA: Diagnosis not present

## 2017-06-07 DIAGNOSIS — R42 Dizziness and giddiness: Secondary | ICD-10-CM | POA: Diagnosis not present

## 2017-06-07 DIAGNOSIS — R1011 Right upper quadrant pain: Secondary | ICD-10-CM | POA: Insufficient documentation

## 2017-06-07 DIAGNOSIS — E1140 Type 2 diabetes mellitus with diabetic neuropathy, unspecified: Secondary | ICD-10-CM | POA: Insufficient documentation

## 2017-06-07 DIAGNOSIS — E860 Dehydration: Secondary | ICD-10-CM | POA: Diagnosis not present

## 2017-06-07 DIAGNOSIS — R112 Nausea with vomiting, unspecified: Secondary | ICD-10-CM | POA: Diagnosis not present

## 2017-06-07 DIAGNOSIS — R197 Diarrhea, unspecified: Secondary | ICD-10-CM | POA: Insufficient documentation

## 2017-06-07 DIAGNOSIS — Z7982 Long term (current) use of aspirin: Secondary | ICD-10-CM | POA: Insufficient documentation

## 2017-06-07 DIAGNOSIS — Z79899 Other long term (current) drug therapy: Secondary | ICD-10-CM | POA: Insufficient documentation

## 2017-06-07 DIAGNOSIS — Z87891 Personal history of nicotine dependence: Secondary | ICD-10-CM | POA: Insufficient documentation

## 2017-06-07 DIAGNOSIS — I10 Essential (primary) hypertension: Secondary | ICD-10-CM | POA: Insufficient documentation

## 2017-06-07 LAB — BASIC METABOLIC PANEL
ANION GAP: 13 (ref 5–15)
BUN: 12 mg/dL (ref 6–20)
CHLORIDE: 101 mmol/L (ref 101–111)
CO2: 24 mmol/L (ref 22–32)
Calcium: 9 mg/dL (ref 8.9–10.3)
Creatinine, Ser: 0.85 mg/dL (ref 0.44–1.00)
GFR calc Af Amer: >60 mL/min (ref >60)
GFR calc non Af Amer: >60 mL/min (ref >60)
GLUCOSE: 118 mg/dL — AB (ref 65–99)
POTASSIUM: 4.4 mmol/L (ref 3.5–5.1)
Sodium: 138 mmol/L (ref 135–145)

## 2017-06-07 LAB — URINALYSIS, ROUTINE W REFLEX MICROSCOPIC
BILIRUBIN URINE: NEGATIVE
Glucose, UA: >=500 mg/dL — AB
HGB URINE DIPSTICK: NEGATIVE
Ketones, ur: 80 mg/dL — AB
LEUKOCYTES UA: NEGATIVE
NITRITE: NEGATIVE
PROTEIN: 30 mg/dL — AB
SPECIFIC GRAVITY, URINE: 1.032 — AB (ref 1.005–1.030)
pH: 5 (ref 5.0–8.0)

## 2017-06-07 LAB — CBG MONITORING, ED
GLUCOSE-CAPILLARY: 101 mg/dL — AB (ref 65–99)
Glucose-Capillary: 96 mg/dL (ref 65–99)

## 2017-06-07 LAB — CBC
HEMATOCRIT: 41.6 % (ref 36.0–46.0)
HEMOGLOBIN: 13.6 g/dL (ref 12.0–15.0)
MCH: 28.2 pg (ref 26.0–34.0)
MCHC: 32.7 g/dL (ref 30.0–36.0)
MCV: 86.1 fL (ref 78.0–100.0)
Platelets: 284 10*3/uL (ref 150–400)
RBC: 4.83 MIL/uL (ref 3.87–5.11)
RDW: 14.3 % (ref 11.5–15.5)
WBC: 9.3 10*3/uL (ref 4.0–10.5)

## 2017-06-07 LAB — D-DIMER, QUANTITATIVE: D-Dimer, Quant: 0.36 ug/mL-FEU (ref 0.00–0.50)

## 2017-06-07 LAB — HEPATIC FUNCTION PANEL
ALT: 15 U/L (ref 14–54)
AST: 17 U/L (ref 15–41)
Albumin: 3.4 g/dL — ABNORMAL LOW (ref 3.5–5.0)
Alkaline Phosphatase: 52 U/L (ref 38–126)
Bilirubin, Direct: 0.2 mg/dL (ref 0.1–0.5)
Indirect Bilirubin: 1 mg/dL — ABNORMAL HIGH (ref 0.3–0.9)
Total Bilirubin: 1.2 mg/dL (ref 0.3–1.2)
Total Protein: 7.3 g/dL (ref 6.5–8.1)

## 2017-06-07 LAB — I-STAT CG4 LACTIC ACID, ED
Lactic Acid, Venous: 3.08 mmol/L (ref 0.5–1.9)
Lactic Acid, Venous: 0.88 mmol/L (ref 0.5–1.9)

## 2017-06-07 LAB — I-STAT BETA HCG BLOOD, ED (MC, WL, AP ONLY)

## 2017-06-07 LAB — LIPASE, BLOOD: Lipase: 28 U/L (ref 11–51)

## 2017-06-07 MED ORDER — SODIUM CHLORIDE 0.9 % IV BOLUS
1000.0000 mL | Freq: Once | INTRAVENOUS | Status: AC
  Administered 2017-06-07: 1000 mL via INTRAVENOUS

## 2017-06-07 MED ORDER — PROMETHAZINE HCL 25 MG RE SUPP: 25.0000 mg | Freq: Four times a day (QID) | RECTAL | 0 refills | Status: DC | PRN

## 2017-06-07 NOTE — Discharge Instructions (Signed)
Follow-up with your primary doctor soon as possible.  Increase your fluid intake.  Return for any worsening in your condition.  Your ultrasound did not show any signs of inflammation around her gallbladder or gallstones at this time.  There was some debris noted within the gallbladder but this should not cause significant problems.

## 2017-06-07 NOTE — ED Notes (Signed)
Pt verbalized understanding of d/c instructions and has no further questions, VSS, NAD.  

## 2017-06-07 NOTE — ED Triage Notes (Signed)
Pt feels like she is in DKA. Pt recently admitted for the same. Pt has been having n/v, abd pain, diarrhea. Pt reports her CBG has been 80-100, but she still feels the same as she did when she was in DKA previously. Pt has not taken insulin due to n/v and not eating.
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
## 2017-06-07 NOTE — ED Notes (Signed)
Patient transported to Ultrasound 

## 2017-06-08 ENCOUNTER — Ambulatory Visit: Payer: Self-pay | Admitting: Endocrinology

## 2017-06-15 ENCOUNTER — Other Ambulatory Visit: Payer: Self-pay | Admitting: Endocrinology

## 2017-06-27 ENCOUNTER — Other Ambulatory Visit: Payer: Self-pay | Admitting: Endocrinology

## 2017-06-27 NOTE — Telephone Encounter (Signed)
Please refill x 1 Ov is due  

## 2017-06-27 NOTE — Telephone Encounter (Signed)
Patent hasn't been seen since 05/2016 ok to fill? She was also recently in ED for ketoacidosis.

## 2017-07-04 ENCOUNTER — Ambulatory Visit: Payer: 59 | Admitting: Endocrinology

## 2017-07-04 ENCOUNTER — Encounter: Payer: Self-pay | Admitting: Endocrinology

## 2017-07-04 VITALS — BP 124/84 | HR 77 | Ht 64.0 in | Wt 198.4 lb

## 2017-07-04 DIAGNOSIS — E119 Type 2 diabetes mellitus without complications: Secondary | ICD-10-CM | POA: Diagnosis not present

## 2017-07-04 LAB — POCT GLYCOSYLATED HEMOGLOBIN (HGB A1C): HEMOGLOBIN A1C: 5.6 % (ref 4.0–5.6)

## 2017-07-04 MED ORDER — GLUCOSE BLOOD VI STRP: 1.0000 | ORAL_STRIP | Freq: Two times a day (BID) | 11 refills | Status: DC

## 2017-07-04 MED ORDER — BASAGLAR KWIKPEN 100 UNIT/ML ~~LOC~~ SOPN
60.0000 [IU] | PEN_INJECTOR | Freq: Every day | SUBCUTANEOUS | 0 refills | Status: DC
Start: 2017-07-04 — End: 2018-03-23

## 2017-07-04 NOTE — Patient Instructions (Addendum)
check your blood sugar twice a day: before the 3 meals, and at bedtime.  also check if you have symptoms of your blood sugar being too high or too low.  please keep a record of the readings and bring it to your next appointment here (or you can bring the meter itself).  You can write it on any piece of paper.  please call us sooner if your blood sugar goes below 70, or if you have a lot of readings over 200. Please reduce the basaglar to 60 units daily. Please continue the same other diabetes medications Please come back for a follow-up appointment in 3 months.

## 2017-07-04 NOTE — Progress Notes (Signed)
Subjective:    Patient ID: [name], female    DOB: 21-Sep-1970, 47 y.o.   MRN: [id number]  HPI Pt returns for f/u of diabetes mellitus: DM type: 1 Dx'ed: 1997 Complications: severe painful polyneuropathy, retinopathy, CAD, and foot ulcer Therapy: insulin since dx, and 2 oral meds. GDM: never (G0) DKA: 2013 and 2019 Severe hypoglycemia: never. Pancreatitis: never Other: she takes qd insulin, after poor results with multiple daily injections; she often goes off the insulin for months at a time, due to bipolar disorder, and to control her weight  Interval history: reduced basaglar to 80/d/ due to hypoglycemia.  pt states she feels well in general.  She was recently in the hospital with DKA.   Past Medical History:  Diagnosis Date  . Anxiety   . Depression   . Diabetes mellitus without complication (HCC)   . High cholesterol   . Hypertension   . Neuropathy   . PCOS (polycystic ovarian syndrome)     Past Surgical History:  Procedure Laterality Date  . ELBOW SURGERY    . INSERTION OF AHMED VALVE Right 09/07/2015   Procedure: INSERTION OF AHMED VALVE;  Surgeon: Edmon Crape, MD;  [facility];  Service: Ophthalmology;  Laterality: Right;  . LASER PHOTO ABLATION Right 09/07/2015   Procedure: LASER PHOTO ABLATION;  Surgeon: Edmon Crape, MD;  Location: [facility] OR;  Service: Ophthalmology;  Laterality: Right;  . PARS PLANA VITRECTOMY Right 09/07/2015   Procedure: PARS PLANA VITRECTOMY WITH 25 GAUGE,, with panretinal endolaser,;  Surgeon: Edmon Crape, MD;  Location: [facility] OR;  Service: Ophthalmology;  Laterality: Right;    Social History   Socioeconomic History  . Marital status: Married    Spouse name: Not on file  . Number of children: Not on file  . Years of education: Not on file  . Highest education level: Not on file  Occupational History  . Not on file  Social Needs  . Financial resource strain: Not on file  . Food insecurity:    Worry: Not on file    Inability: Not  on file  . Transportation needs:    Medical: Not on file    Non-medical: Not on file  Tobacco Use  . Smoking status: Former Smoker    Types: Cigarettes    Last attempt to quit: 09/12/2000    Years since quitting: 16.8  . Smokeless tobacco: Never Used  Substance and Sexual Activity  . Alcohol use: Yes    Comment: occasional  . Drug use: Yes    Types: Marijuana  . Sexual activity: Yes    Birth control/protection: None, Condom  Lifestyle  . Physical activity:    Days per week: Not on file    Minutes per session: Not on file  . Stress: Not on file  Relationships  . Social connections:    Talks on phone: Not on file    Gets together: Not on file    Attends religious service: Not on file    Active member of club or organization: Not on file    Attends meetings of clubs or organizations: Not on file    Relationship status: Not on file  . Intimate partner violence:    Fear of current or ex partner: Not on file    Emotionally abused: Not on file    Physically abused: Not on file    Forced sexual activity: Not on file  Other Topics Concern  . Not on file  Social  History Narrative  . Not on file    Current Outpatient Medications on File Prior to Visit  Medication Sig Dispense Refill  . aspirin EC 81 MG tablet Take 81 mg by mouth 2 (two) times daily.    Marland Kitchen atorvastatin (LIPITOR) 40 MG tablet Take 40 mg by mouth every evening.    . celecoxib (CELEBREX) 200 MG capsule Take 200 mg by mouth 2 (two) times daily.     . cyclobenzaprine (FLEXERIL) 10 MG tablet Take 10 mg by mouth 3 (three) times daily as needed for muscle spasms.    . famotidine (PEPCID) 20 MG tablet Take 20 mg by mouth 2 (two) times daily.  1  . gabapentin (NEURONTIN) 800 MG tablet Take 1,600-3,200 mg by mouth See admin instructions. Take 1600 mg by mouth in the morning and take 3200 mg by mouth in the evening.    Marland Kitchen JARDIANCE 10 MG TABS tablet TAKE 1 TABLET BY MOUTH ONCE DAILY 30 tablet 11  . metFORMIN (GLUCOPHAGE) 1000  MG tablet Take 1,000 mg by mouth 2 (two) times daily with a meal.    . Multiple Vitamins-Minerals (MULTIVITAMIN GUMMIES ADULT PO) Take 1 capsule by mouth daily.    . ondansetron (ZOFRAN) 4 MG tablet Take 1 tablet (4 mg total) by mouth every 8 (eight) hours as needed for nausea or vomiting. 15 tablet 0  . spironolactone (ALDACTONE) 100 MG tablet Take 100 mg by mouth daily.  1   No current facility-administered medications on file prior to visit.     Allergies  Allergen Reactions  . Tramadol Other (See Comments)    Unknown     Family History  Problem Relation Age of Onset  . Depression Mother   . Anxiety disorder Mother   . Diabetes Mother   . Diabetes Father   . Diabetes Brother     BP 124/84 (BP Location: Left Arm, Patient Position: Sitting, Cuff Size: Normal)   Pulse 77   Ht 5\' 4"  (1.626 m)   Wt 198 lb 6.4 oz (90 kg)   SpO2 98%   BMI 34.06 kg/m    Review of Systems She denies LOC and weight change.      Objective:   Physical Exam VITAL SIGNS:  See vs page GENERAL: no distress Pulses: foot pulses are intact bilaterally.   MSK: no deformity of the feet or ankles.  CV: no edema of the legs or ankles Skin:  no ulcer on the feet or ankles.  normal color and temp on the feet and ankles Neuro: sensation is intact to touch on the feet and ankles.     Lab Results  Component Value Date   HGBA1C 5.6 07/04/2017       Assessment & Plan:  Type 1 DM: overcontrolled.   DKA: this is evidence of worsening noncompliance.    Patient Instructions  check your blood sugar twice a day: before the 3 meals, and at bedtime.  also check if you have symptoms of your blood sugar being too high or too low.  please keep a record of the readings and bring it to your next appointment here (or you can bring the meter itself).  You can write it on any piece of paper.  please call us sooner if your blood sugar goes below 70, or if you have a lot of readings over 200. Please reduce the basaglar  to 60 units daily. Please continue the same other diabetes medications Please come back for a follow-up appointment in  3 months.

## 2017-08-08 ENCOUNTER — Ambulatory Visit: Payer: Managed Care, Other (non HMO) | Admitting: Podiatry

## 2017-08-08 DIAGNOSIS — L989 Disorder of the skin and subcutaneous tissue, unspecified: Secondary | ICD-10-CM

## 2017-08-08 DIAGNOSIS — E0842 Diabetes mellitus due to underlying condition with diabetic polyneuropathy: Secondary | ICD-10-CM

## 2017-08-12 NOTE — Progress Notes (Signed)
   HPI: A 47 year old female with a history of uncontrolled diabetes mellitus and retinopathy presents today for follow up evaluation of pre-ulcerative callus lesions of bilateral great toes. She states the areas are still very painful, left greater than right. Walking increases the pain. She has been wearing DM shoes and insoles. Patient is here for further evaluation and treatment.   Past Medical History:  Diagnosis Date  . Anxiety   . Depression   . Diabetes mellitus without complication (HCC)   . High cholesterol   . Hypertension   . Neuropathy   . PCOS (polycystic ovarian syndrome)      Physical Exam: General: The patient is alert and oriented x3 in no acute distress.  Dermatology: Hyperkeratotic lesions present on the bilateral great toes. Pain on palpation with a central nucleated core noted.  Skin is warm, dry and supple bilateral lower extremities. Negative for open lesions or macerations.  Vascular: Palpable pedal pulses bilaterally. No edema or erythema noted. Capillary refill within normal limits.  Neurological: Epicritic and protective threshold absent bilaterally.   Musculoskeletal Exam: Range of motion within normal limits to all pedal and ankle joints bilateral. Muscle strength 5/5 in all groups bilateral.   Assessment: 1. Diabetes mellitus complicated by polyneuropathy 2. Retinopathy and loss of vision 3. Pre-ulcerative callus lesions bilateral great toes  Plan of Care:  1. Patient was evaluated. 2. Excisional debridement of keratoic lesion using a chisel blade was performed without incident. Dressed with light dressing. 3. Continue wearing DM shoes and insoles.  4. Return to clinic as needed.   Felecia ShellingBrent M. Kelty Szafran, DPM [facility]  Dr. Felecia ShellingBrent M. Dola Lunsford, DPM    [address]                                        , [place] [address]                Office [phone]  Fax [phone]

## 2017-08-16 ENCOUNTER — Other Ambulatory Visit: Payer: Self-pay | Admitting: Endocrinology

## 2017-08-16 NOTE — Telephone Encounter (Signed)
Ok to refill 

## 2017-08-20 ENCOUNTER — Ambulatory Visit: Payer: Managed Care, Other (non HMO) | Admitting: Podiatry

## 2017-08-20 DIAGNOSIS — L97522 Non-pressure chronic ulcer of other part of left foot with fat layer exposed: Secondary | ICD-10-CM

## 2017-08-20 DIAGNOSIS — E0842 Diabetes mellitus due to underlying condition with diabetic polyneuropathy: Secondary | ICD-10-CM

## 2017-08-20 DIAGNOSIS — L989 Disorder of the skin and subcutaneous tissue, unspecified: Secondary | ICD-10-CM

## 2017-08-20 DIAGNOSIS — I70245 Atherosclerosis of native arteries of left leg with ulceration of other part of foot: Secondary | ICD-10-CM | POA: Diagnosis not present

## 2017-08-20 MED ORDER — GENTAMICIN SULFATE 0.1 % EX CREA: 1.0000 "application " | TOPICAL_CREAM | Freq: Three times a day (TID) | CUTANEOUS | 1 refills | Status: DC

## 2017-08-22 NOTE — Progress Notes (Signed)
Take 27  HPI: A 47 year old female with a history of uncontrolled diabetes mellitus and retinopathy presents today for follow up evaluation of pre-ulcerative callus lesions of bilateral great toes. She states the left hallux has worsened. There are no modifying factors noted. Patient is here for further evaluation and treatment.   Past Medical History:  Diagnosis Date  . Anxiety   . Depression   . Diabetes mellitus without complication (HCC)   . High cholesterol   . Hypertension   . Neuropathy   . PCOS (polycystic ovarian syndrome)      Physical Exam: General: The patient is alert and oriented x3 in no acute distress.  Dermatology: Hyperkeratotic lesion present on the right great toe. Pain on palpation with a central nucleated core noted.  Skin is warm, dry and supple bilateral lower extremities.   Wound #1 noted to the left hallux measuring 0.2 x 0.2 x 0.1 cm.   To the above-noted ulceration, there is no eschar. There is a moderate amount of slough, fibrin and necrotic tissue. Granulation tissue and wound base is red. There is no malodor. There is a minimal amount of serosanginous drainage noted. Periwound integrity is intact.   Vascular: Palpable pedal pulses bilaterally. No edema or erythema noted. Capillary refill within normal limits.  Neurological: Epicritic and protective threshold absent bilaterally.   Musculoskeletal Exam: Range of motion within normal limits to all pedal and ankle joints bilateral. Muscle strength 5/5 in all groups bilateral.   Assessment: 1. Diabetes mellitus complicated by polyneuropathy 2. Retinopathy and loss of vision 3. Ulceration of the left hallux secondary to diabetes mellitus  4. Pre-ulcerative callus lesion noted to right hallux   Plan of Care:  1. Patient was evaluated. 2. Medically necessary excisional debridement including subcutaneous tissue was performed using a tissue nipper and a chisel blade. Excisional debridement of all the  necrotic nonviable tissue down to healthy bleeding viable tissue was performed with post-debridement measurements same as pre-. 3. The wound was cleansed and dry sterile dressing applied. 4. Excisional debridement of keratoic lesion using a chisel blade was performed without incident. Light dressing applied.  5. Prescription for gentamicin cream provided to patient.  6. Patient has not been wearing DM shoes and going barefoot. Stressed importance of DM shoes even around the house.  7. Return to clinic as needed.     Felecia ShellingBrent M. Aedan Geimer, DPM [facility]  Dr. Felecia ShellingBrent M. Dashae Wilcher, DPM    [address]                                        [place], [place] [address]                Office [phone]  Fax [phone]

## 2017-10-08 ENCOUNTER — Ambulatory Visit: Payer: Self-pay | Admitting: Endocrinology

## 2018-03-19 ENCOUNTER — Telehealth: Payer: Self-pay | Admitting: Podiatry

## 2018-03-19 NOTE — Telephone Encounter (Signed)
Pt does not have insurance and she has an ulcer that needs to be treat as soon as possible and she can't afford to pay the office to be seen. She wanted to know is there any where else she can go, she needs to get it treated. She wants to know where else she can go

## 2018-03-19 NOTE — Telephone Encounter (Signed)
[facility] [facility] accepts pt without insurance, call [phone]. Unable to leave a message on pt's mobile phone voicemail was not set up.

## 2018-03-19 NOTE — Telephone Encounter (Signed)
I called pt's mobile phone again and pt answered, I informed of [facility] [phone] and services.

## 2018-03-20 ENCOUNTER — Encounter (HOSPITAL_COMMUNITY): Payer: Self-pay | Admitting: Emergency Medicine

## 2018-03-20 ENCOUNTER — Observation Stay (HOSPITAL_COMMUNITY): Payer: 59

## 2018-03-20 ENCOUNTER — Other Ambulatory Visit: Payer: Self-pay

## 2018-03-20 ENCOUNTER — Inpatient Hospital Stay (HOSPITAL_COMMUNITY)
Admission: EM | Admit: 2018-03-20 | Discharge: 2018-03-23 | DRG: 638 | Disposition: A | Discharge status: Home / Self Care | Payer: 59 | Attending: Internal Medicine | Admitting: Internal Medicine

## 2018-03-20 DIAGNOSIS — E785 Hyperlipidemia, unspecified: Secondary | ICD-10-CM | POA: Diagnosis present

## 2018-03-20 DIAGNOSIS — E872 Acidosis: Secondary | ICD-10-CM | POA: Diagnosis present

## 2018-03-20 DIAGNOSIS — E282 Polycystic ovarian syndrome: Secondary | ICD-10-CM | POA: Diagnosis present

## 2018-03-20 DIAGNOSIS — E1165 Type 2 diabetes mellitus with hyperglycemia: Secondary | ICD-10-CM | POA: Diagnosis present

## 2018-03-20 DIAGNOSIS — L97529 Non-pressure chronic ulcer of other part of left foot with unspecified severity: Secondary | ICD-10-CM | POA: Diagnosis present

## 2018-03-20 DIAGNOSIS — Z794 Long term (current) use of insulin: Secondary | ICD-10-CM

## 2018-03-20 DIAGNOSIS — E875 Hyperkalemia: Secondary | ICD-10-CM | POA: Diagnosis present

## 2018-03-20 DIAGNOSIS — E11628 Type 2 diabetes mellitus with other skin complications: Secondary | ICD-10-CM | POA: Diagnosis present

## 2018-03-20 DIAGNOSIS — F419 Anxiety disorder, unspecified: Secondary | ICD-10-CM | POA: Diagnosis present

## 2018-03-20 DIAGNOSIS — IMO0002 Reserved for concepts with insufficient information to code with codable children: Secondary | ICD-10-CM

## 2018-03-20 DIAGNOSIS — E119 Type 2 diabetes mellitus without complications: Secondary | ICD-10-CM

## 2018-03-20 DIAGNOSIS — F329 Major depressive disorder, single episode, unspecified: Secondary | ICD-10-CM | POA: Diagnosis present

## 2018-03-20 DIAGNOSIS — E11319 Type 2 diabetes mellitus with unspecified diabetic retinopathy without macular edema: Secondary | ICD-10-CM | POA: Diagnosis present

## 2018-03-20 DIAGNOSIS — K219 Gastro-esophageal reflux disease without esophagitis: Secondary | ICD-10-CM | POA: Diagnosis present

## 2018-03-20 DIAGNOSIS — R739 Hyperglycemia, unspecified: Secondary | ICD-10-CM

## 2018-03-20 DIAGNOSIS — E1142 Type 2 diabetes mellitus with diabetic polyneuropathy: Secondary | ICD-10-CM | POA: Diagnosis present

## 2018-03-20 DIAGNOSIS — E11621 Type 2 diabetes mellitus with foot ulcer: Principal | ICD-10-CM | POA: Diagnosis present

## 2018-03-20 DIAGNOSIS — E08621 Diabetes mellitus due to underlying condition with foot ulcer: Secondary | ICD-10-CM

## 2018-03-20 DIAGNOSIS — L97519 Non-pressure chronic ulcer of other part of right foot with unspecified severity: Secondary | ICD-10-CM

## 2018-03-20 DIAGNOSIS — I10 Essential (primary) hypertension: Secondary | ICD-10-CM | POA: Diagnosis present

## 2018-03-20 DIAGNOSIS — L97509 Non-pressure chronic ulcer of other part of unspecified foot with unspecified severity: Secondary | ICD-10-CM

## 2018-03-20 DIAGNOSIS — Z9119 Patient's noncompliance with other medical treatment and regimen: Secondary | ICD-10-CM

## 2018-03-20 DIAGNOSIS — L089 Local infection of the skin and subcutaneous tissue, unspecified: Secondary | ICD-10-CM

## 2018-03-20 DIAGNOSIS — Z7982 Long term (current) use of aspirin: Secondary | ICD-10-CM

## 2018-03-20 DIAGNOSIS — Z833 Family history of diabetes mellitus: Secondary | ICD-10-CM

## 2018-03-20 DIAGNOSIS — Z79899 Other long term (current) drug therapy: Secondary | ICD-10-CM

## 2018-03-20 DIAGNOSIS — D72829 Elevated white blood cell count, unspecified: Secondary | ICD-10-CM | POA: Diagnosis present

## 2018-03-20 DIAGNOSIS — Z885 Allergy status to narcotic agent status: Secondary | ICD-10-CM

## 2018-03-20 DIAGNOSIS — Z818 Family history of other mental and behavioral disorders: Secondary | ICD-10-CM

## 2018-03-20 LAB — CBC WITH DIFFERENTIAL/PLATELET
Abs Immature Granulocytes: 0.06 10*3/uL (ref 0.00–0.07)
Basophils Absolute: 0.1 10*3/uL (ref 0.0–0.1)
Basophils Relative: 1 %
EOS PCT: 4 %
Eosinophils Absolute: 0.5 10*3/uL (ref 0.0–0.5)
HEMATOCRIT: 41.9 % (ref 36.0–46.0)
HEMOGLOBIN: 14 g/dL (ref 12.0–15.0)
Immature Granulocytes: 0 %
LYMPHS ABS: 2.7 10*3/uL (ref 0.7–4.0)
LYMPHS PCT: 20 %
MCH: 27.8 pg (ref 26.0–34.0)
MCHC: 33.4 g/dL (ref 30.0–36.0)
MCV: 83.1 fL (ref 80.0–100.0)
MONO ABS: 0.8 10*3/uL (ref 0.1–1.0)
Monocytes Relative: 6 %
Neutro Abs: 9.5 10*3/uL — ABNORMAL HIGH (ref 1.7–7.7)
Neutrophils Relative %: 69 %
Platelets: 334 10*3/uL (ref 150–400)
RBC: 5.04 MIL/uL (ref 3.87–5.11)
RDW: 13.7 % (ref 11.5–15.5)
WBC: 13.6 10*3/uL — ABNORMAL HIGH (ref 4.0–10.5)
nRBC: 0 % (ref 0.0–0.2)

## 2018-03-20 LAB — COMPREHENSIVE METABOLIC PANEL
ALK PHOS: 73 U/L (ref 38–126)
ALT: 20 U/L (ref 0–44)
AST: 37 U/L (ref 15–41)
Albumin: 3.4 g/dL — ABNORMAL LOW (ref 3.5–5.0)
Anion gap: 12 (ref 5–15)
BILIRUBIN TOTAL: 1.7 mg/dL — AB (ref 0.3–1.2)
BUN: 12 mg/dL (ref 6–20)
CHLORIDE: 101 mmol/L (ref 98–111)
CO2: 20 mmol/L — AB (ref 22–32)
CREATININE: 0.88 mg/dL (ref 0.44–1.00)
Calcium: 9.3 mg/dL (ref 8.9–10.3)
GFR calc Af Amer: >60 mL/min (ref >60)
Glucose, Bld: 375 mg/dL — ABNORMAL HIGH (ref 70–99)
Potassium: 5.8 mmol/L — ABNORMAL HIGH (ref 3.5–5.1)
Sodium: 133 mmol/L — ABNORMAL LOW (ref 135–145)
Total Protein: 7.5 g/dL (ref 6.5–8.1)

## 2018-03-20 LAB — GLUCOSE, CAPILLARY: Glucose-Capillary: 262 mg/dL — ABNORMAL HIGH (ref 70–99)

## 2018-03-20 LAB — CBG MONITORING, ED: Glucose-Capillary: 358 mg/dL — ABNORMAL HIGH (ref 70–99)

## 2018-03-20 LAB — HEMOGLOBIN A1C
Hgb A1c MFr Bld: 10.9 % — ABNORMAL HIGH (ref 4.8–5.6)
Mean Plasma Glucose: 266.13 mg/dL

## 2018-03-20 LAB — SEDIMENTATION RATE: Sed Rate: 25 mm/hr — ABNORMAL HIGH (ref 0–22)

## 2018-03-20 LAB — LACTIC ACID, PLASMA
LACTIC ACID, VENOUS: 2 mmol/L — AB (ref 0.5–1.9)
Lactic Acid, Venous: 3.1 mmol/L (ref 0.5–1.9)

## 2018-03-20 LAB — C-REACTIVE PROTEIN

## 2018-03-20 MED ORDER — INSULIN ASPART 100 UNIT/ML ~~LOC~~ SOLN
0.0000 [IU] | Freq: Three times a day (TID) | SUBCUTANEOUS | Status: DC
  Administered 2018-03-21 (×3): 5 [IU] via SUBCUTANEOUS
  Administered 2018-03-22: 2 [IU] via SUBCUTANEOUS
  Administered 2018-03-22 – 2018-03-23 (×2): 5 [IU] via SUBCUTANEOUS

## 2018-03-20 MED ORDER — GABAPENTIN 400 MG PO CAPS
1600.0000 mg | ORAL_CAPSULE | Freq: Every day | ORAL | Status: DC
  Administered 2018-03-21 – 2018-03-23 (×3): 1600 mg via ORAL
  Filled 2018-03-20 (×3): qty 4

## 2018-03-20 MED ORDER — ACETAMINOPHEN 500 MG PO TABS: 1000.0000 mg | ORAL_TABLET | Freq: Three times a day (TID) | ORAL | Status: DC | PRN

## 2018-03-20 MED ORDER — SODIUM CHLORIDE 0.9 % IV BOLUS
500.0000 mL | Freq: Once | INTRAVENOUS | Status: AC
  Administered 2018-03-20: 500 mL via INTRAVENOUS

## 2018-03-20 MED ORDER — ONDANSETRON HCL 4 MG PO TABS: 4.0000 mg | ORAL_TABLET | Freq: Four times a day (QID) | ORAL | Status: DC | PRN

## 2018-03-20 MED ORDER — GABAPENTIN 400 MG PO CAPS
3200.0000 mg | ORAL_CAPSULE | Freq: Every day | ORAL | Status: DC
  Administered 2018-03-20 – 2018-03-22 (×3): 3200 mg via ORAL
  Filled 2018-03-20 (×3): qty 8

## 2018-03-20 MED ORDER — ATORVASTATIN CALCIUM 40 MG PO TABS
40.0000 mg | ORAL_TABLET | Freq: Every evening | ORAL | Status: DC
  Administered 2018-03-22: 40 mg via ORAL
  Filled 2018-03-20 (×3): qty 1

## 2018-03-20 MED ORDER — ENOXAPARIN SODIUM 40 MG/0.4ML ~~LOC~~ SOLN
40.0000 mg | SUBCUTANEOUS | Status: DC
  Administered 2018-03-20 – 2018-03-22 (×3): 40 mg via SUBCUTANEOUS
  Filled 2018-03-20 (×3): qty 0.4

## 2018-03-20 MED ORDER — INSULIN ASPART 100 UNIT/ML ~~LOC~~ SOLN
0.0000 [IU] | Freq: Every day | SUBCUTANEOUS | Status: DC
  Administered 2018-03-20: 3 [IU] via SUBCUTANEOUS

## 2018-03-20 MED ORDER — ONDANSETRON HCL 4 MG/2ML IJ SOLN: 4.0000 mg | Freq: Four times a day (QID) | INTRAMUSCULAR | Status: DC | PRN

## 2018-03-20 MED ORDER — FAMOTIDINE 20 MG PO TABS
20.0000 mg | ORAL_TABLET | Freq: Two times a day (BID) | ORAL | Status: DC
  Administered 2018-03-20 – 2018-03-23 (×6): 20 mg via ORAL
  Filled 2018-03-20 (×6): qty 1

## 2018-03-20 MED ORDER — INSULIN ASPART 100 UNIT/ML ~~LOC~~ SOLN
6.0000 [IU] | Freq: Once | SUBCUTANEOUS | Status: AC
  Administered 2018-03-20: 6 [IU] via INTRAVENOUS

## 2018-03-20 MED ORDER — SODIUM CHLORIDE 0.9 % IV SOLN
INTRAVENOUS | Status: AC
  Administered 2018-03-20: 23:00:00 via INTRAVENOUS

## 2018-03-20 MED ORDER — PIPERACILLIN-TAZOBACTAM 3.375 G IVPB 30 MIN
3.3750 g | Freq: Once | INTRAVENOUS | Status: AC
  Administered 2018-03-20: 3.375 g via INTRAVENOUS
  Filled 2018-03-20: qty 50

## 2018-03-20 MED ORDER — GABAPENTIN 800 MG PO TABS: 1600.0000 mg | ORAL_TABLET | ORAL | Status: DC

## 2018-03-20 MED ORDER — SODIUM CHLORIDE 0.9 % IV SOLN
3.0000 g | Freq: Four times a day (QID) | INTRAVENOUS | Status: DC
  Administered 2018-03-20 – 2018-03-23 (×11): 3 g via INTRAVENOUS
  Filled 2018-03-20 (×13): qty 3

## 2018-03-20 NOTE — ED Triage Notes (Signed)
Patient arrived via home with complaints with increased warmth and pain in right great toe since Friday 3/13 after popping blister. Reports drainage being bloody and pus filled that day, with no drainage since then. Denies fever, but reported foot feeling warm to touch and having difficulty applying pressure to right foot. History of diabetes, unable to afford insulin, complaints of increased thirst. Treatment with old prescription of Macrobid and Gentamycin. EDP [place] at bedside.  Arsenio Loader, MEd, RN CEN 03/20/2018 4:54 PM

## 2018-03-20 NOTE — H&P (Signed)
History and Physical    [name] ZOX:[id number] DOB: October 15, 1970 DOA: 03/20/2018  PCP: Marda Stalker, PA-C  Patient coming from: Home  I have personally briefly reviewed patient's old medical records in [place]  Chief Complaint: Right toe wound  HPI: [name] is a 48 y.o. female with medical history significant for insulin-dependent diabetes complicated by diabetic retinopathy and peripheral neuropathy,, hypertension, hyperlipidemia, and depression/anxiety who presents to the ED with worsening of a right foot wound.  Patient reports chronic diabetic wounds of the first toes of both of her feet previously for which she has been treated at the wound care center and more recently with her podiatrist, Dr. Amalia Hailey.  She lost her insurance and has been unable to follow-up with outpatient care and has been unable to afford many of her medications including her insulin and psychiatric medications for the last 6 months.  She noticed developing a blood blister at her right first toe wound about 1 week ago which she popped at home with bloody and purulent discharge.  She had continued pain and noticed some overlying erythema, therefore presented to the ED for further evaluation.  She denies any associated fevers, chills, diaphoresis, chest pain, shortness of breath, abdominal pain.  ED Course:  Initial vitals in the ED showed BP 161/120, pulse 111, RR 18, temp 98.1 Fahrenheit, SPO2 99% on room air.   Labs are notable for WBC 13.6, hemoglobin 14.0, platelets 334, Sodium 133, potassium 5.8, bicarb 20, BUN 12, creatinine 0.88, serum glucose 375, anion gap 12, lactic acid 3.1 >> 2.0.  Right foot x-ray was negative for fracture, soft tissue gas, or acute osseous abnormality.  Blood cultures were collected and the patient was given 1 L normal saline, 6 units NovoLog, and started on IV Zosyn.  The hospitalist service was consulted to admit for further management.  Review of Systems: As  per HPI otherwise 10 point review of systems negative.    Past Medical History:  Diagnosis Date  . Anxiety   . Depression   . Diabetes mellitus without complication ([place])   . High cholesterol   . Hypertension   . Neuropathy   . PCOS (polycystic ovarian syndrome)     Past Surgical History:  Procedure Laterality Date  . ELBOW SURGERY    . INSERTION OF AHMED VALVE Right 09/07/2015   Procedure: INSERTION OF AHMED VALVE;  Surgeon: Hurman Horn, MD;  Location: [place];  Service: Ophthalmology;  Laterality: Right;  . LASER PHOTO ABLATION Right 09/07/2015   Procedure: LASER PHOTO ABLATION;  Surgeon: Hurman Horn, MD;  Location: [place];  Service: Ophthalmology;  Laterality: Right;  . PARS PLANA VITRECTOMY Right 09/07/2015   Procedure: PARS PLANA VITRECTOMY WITH 25 GAUGE,, with panretinal endolaser,;  Surgeon: Hurman Horn, MD;  Location: [place];  Service: Ophthalmology;  Laterality: Right;     reports that she quit smoking about 17 years ago. Her smoking use included cigarettes. She has never used smokeless tobacco. She reports current alcohol use. She reports current drug use. Drug: Marijuana.  Allergies  Allergen Reactions  . Tramadol Other (See Comments)    Unknown; pt claims she doesn't recall having this allergy     Family History  Problem Relation Age of Onset  . Depression Mother   . Anxiety disorder Mother   . Diabetes Mother   . Diabetes Father   . Diabetes Brother      Prior to Admission medications   Medication Sig Start  Date End Date Taking? Authorizing Provider  aspirin EC 81 MG tablet Take 81 mg by mouth 2 (two) times daily.   Yes [provider]  atorvastatin (LIPITOR) 40 MG tablet Take 40 mg by mouth every evening. 07/14/15  Yes [provider]  busPIRone (BUSPAR) 10 MG tablet Take 10 mg by mouth 2 (two) times daily. 10/15/17  Yes [provider]  celecoxib (CELEBREX) 200 MG capsule Take 200 mg by mouth 2 (two) times daily.    Yes [provider]  famotidine (PEPCID) 20 MG tablet Take 20 mg by mouth 2 (two) times daily. 05/22/17  Yes [provider]  FLUoxetine (PROZAC) 40 MG capsule Take 40 mg by mouth daily. 10/15/17  Yes [provider]  gabapentin (NEURONTIN) 800 MG tablet Take 1,600-3,200 mg by mouth See admin instructions. Take 1600 mg by mouth in the morning and take 3200 mg by mouth in the evening.   Yes [provider]  gentamicin cream (GARAMYCIN) 0.1 % Apply 1 application topically 3 (three) times daily. 08/20/17  Yes Edrick Kins, DPM  hydrOXYzine (ATARAX/VISTARIL) 25 MG tablet Take 1-2 tablets by mouth every 6 (six) hours as needed for anxiety. 10/15/17  Yes [provider]  Insulin Glargine (BASAGLAR KWIKPEN) 100 UNIT/ML SOPN Inject 1.1 mLs (110 Units total) into the skin every morning. 08/18/17  Yes Renato Shin, MD  JARDIANCE 10 MG TABS tablet TAKE 1 TABLET BY MOUTH ONCE DAILY Patient taking differently: Take 10 mg by mouth daily.  05/31/17  Yes Renato Shin, MD  metFORMIN (GLUCOPHAGE) 1000 MG tablet Take 1,000 mg by mouth 2 (two) times daily with a meal.   Yes [provider]  Multiple Vitamins-Minerals (MULTIVITAMIN GUMMIES ADULT PO) Take 1 capsule by mouth daily.   Yes [provider]  naproxen sodium (ALEVE) 220 MG tablet Take 440 mg by mouth 2 (two) times daily as needed (ear pain).   Yes [provider]  ondansetron (ZOFRAN) 4 MG tablet Take 1 tablet (4 mg total) by mouth every 8 (eight) hours as needed for nausea or vomiting. 05/13/17  Yes Ghimire, Henreitta Leber, MD  spironolactone (ALDACTONE) 100 MG tablet Take 100 mg by mouth daily. 05/27/17  Yes [provider]  glucose blood (ONE TOUCH ULTRA TEST) test strip 1 each by Other route 2 (two) times daily. And lancets 2/day. 07/04/17   Renato Shin, MD  Insulin Glargine (BASAGLAR KWIKPEN) 100 UNIT/ML SOPN Inject 0.6 mLs (60 Units total) into the skin daily. And pen needles 1/day 07/04/17    Renato Shin, MD    Physical Exam: Vitals:   03/20/18 1811 03/20/18 1817 03/20/18 1830 03/20/18 2007  BP:  123/88 118/84 (!) 88/67  Pulse: (!) 101 (!) 101 100 (!) 109  Resp:    16  Temp:    98.7 F (37.1 C)  TempSrc:    Oral  SpO2:    93%  Weight:      Height:        Constitutional: Sitting up in bed, NAD, calm, comfortable Eyes: PERRL, lids and conjunctivae normal ENMT: Mucous membranes are moist. Posterior pharynx clear of any exudate or lesions.Normal dentition.  Neck: normal, supple, no masses. Respiratory: clear to auscultation bilaterally, no wheezing, no crackles. Normal respiratory effort. No accessory muscle use.  Cardiovascular: Regular rate and rhythm, no murmurs / rubs / gallops. No extremity edema. 2+ pedal pulses. Abdomen: no tenderness, no masses palpated. No hepatosplenomegaly. Bowel sounds positive.  Musculoskeletal: no clubbing / cyanosis. No  joint deformity upper and lower extremities. Good ROM, no contractures. Normal muscle tone.  Skin: ulcer of right medial first toe with overlying erythema and eschar, ulcer left first toe with overlying eschar Neurologic: CN 2-12 grossly intact. Sensation intact, Strength 5/5 in all 4.  Psychiatric: Normal judgment and insight. Alert and oriented x 3. Normal mood.     Labs on Admission: I have personally reviewed following labs and imaging studies  CBC: Recent Labs  Lab 03/20/18 1700  WBC 13.6*  NEUTROABS 9.5*  HGB 14.0  HCT 41.9  MCV 83.1  PLT 161   Basic Metabolic Panel: Recent Labs  Lab 03/20/18 1700  NA 133*  K 5.8*  CL 101  CO2 20*  GLUCOSE 375*  BUN 12  CREATININE 0.88  CALCIUM 9.3   GFR: Estimated Creatinine Clearance: 88.1 mL/min (by C-G formula based on SCr of 0.88 mg/dL). Liver Function Tests: Recent Labs  Lab 03/20/18 1700  AST 37  ALT 20  ALKPHOS 73  BILITOT 1.7*  PROT 7.5  ALBUMIN 3.4*   No results for input(s): LIPASE, AMYLASE in the last 168 hours. No results for input(s):  AMMONIA in the last 168 hours. Coagulation Profile: No results for input(s): INR, PROTIME in the last 168 hours. Cardiac Enzymes: No results for input(s): CKTOTAL, CKMB, CKMBINDEX, TROPONINI in the last 168 hours. BNP (last 3 results) No results for input(s): PROBNP in the last 8760 hours. HbA1C: No results for input(s): HGBA1C in the last 72 hours. CBG: Recent Labs  Lab 03/20/18 1657  GLUCAP 358*   Lipid Profile: No results for input(s): CHOL, HDL, LDLCALC, TRIG, CHOLHDL, LDLDIRECT in the last 72 hours. Thyroid Function Tests: No results for input(s): TSH, T4TOTAL, FREET4, T3FREE, THYROIDAB in the last 72 hours. Anemia Panel: No results for input(s): VITAMINB12, FOLATE, FERRITIN, TIBC, IRON, RETICCTPCT in the last 72 hours. Urine analysis:    Component Value Date/Time   COLORURINE YELLOW 06/07/2017 1226   APPEARANCEUR CLEAR 06/07/2017 1226   LABSPEC 1.032 (H) 06/07/2017 1226   PHURINE 5.0 06/07/2017 1226   GLUCOSEU >=500 (A) 06/07/2017 1226   HGBUR NEGATIVE 06/07/2017 1226   BILIRUBINUR NEGATIVE 06/07/2017 1226   KETONESUR 80 (A) 06/07/2017 1226   PROTEINUR 30 (A) 06/07/2017 1226   UROBILINOGEN 0.2 11/28/2013 0023   NITRITE NEGATIVE 06/07/2017 1226   LEUKOCYTESUR NEGATIVE 06/07/2017 1226    Radiological Exams on Admission: Dg Foot 2 Views Right  Result Date: 03/20/2018 CLINICAL DATA:  Diabetic foot ulcer EXAM: RIGHT FOOT - 2 VIEW COMPARISON:  05/26/2016 FINDINGS: Vascular calcifications. No fracture or malalignment. No gas in the soft tissues. Joint spaces are normal IMPRESSION: No acute osseous abnormality Electronically Signed   By: Donavan Foil M.D.   On: 03/20/2018 19:09    EKG: Not obtained.  Assessment/Plan Principal Problem:   Diabetic foot ulcer ([place]) Active Problems:   Insulin dependent diabetes mellitus ([place])   Hypertension   Hyperkalemia  [name] is a 48 y.o. female with medical history significant for insulin-dependent diabetes complicated  by diabetic retinopathy and peripheral neuropathy,, hypertension, hyperlipidemia, and depression/anxiety who is admitted with right first toe wound.   Right first toe diabetic foot ulcer: Associated with leukocytosis, lactic acidosis in the setting of self manipulation.  X-ray without evidence of osseous involvement. -Switch Zosyn to IV Unasyn -ESR 25, CRP <0.8 -Consult wound care -Follow blood cultures  Hyperkalemia: K 5.8 on admission.  Expect this will improve with IV fluids and insulin.  Has been prescribed spironolactone, however  says she has not taken recently. -Repeat in a.m.  Insulin-dependent diabetes with retinopathy and peripheral neuropathy: Patient reports not taking insulin for 6 months due to cost, he has continued taking metformin 1000 mg twice daily.  Last A1c was 5.6 on 07/04/2017. -SSI while inpatient -Check A1c -Consult to care management and diabetes coordinator for medication assistance  Hypertension: Previously on spinal lactone, has not taken recently.  Has been hypotensive to normotensive on admission. -Continue IV fluids overnight   DVT prophylaxis: Lovenox Code Status: Full code, confirmed with patient Family Communication: Discussed with husband at bedside Disposition Plan: Likely discharge to home in 1-2 days Consults called: None Admission status: Observation   Zada Finders MD [facility] Pager [phone]  If 7PM-7AM, please contact night-coverage [email or web address]  03/20/2018, 9:18 PM

## 2018-03-20 NOTE — ED Notes (Signed)
Pt CBG was 358, notified Joyce(RN)
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
## 2018-03-20 NOTE — ED Notes (Signed)
Patient in xray 

## 2018-03-20 NOTE — ED Notes (Signed)
EDP [place] updated on critical value. Arsenio Loader, MEd, RN [facility] 03/20/2018 6:21 PM

## 2018-03-20 NOTE — ED Notes (Addendum)
ED TO INPATIENT HANDOFF REPORT  ED Nurse Name and Phone #: Alona Bene [phone]  S Name/Age/Gender [name] 48 y.o. female Room/Bed: 027C/027C  Code Status   Code Status: Prior  Home/SNF/Other Home Patient oriented to: self Is this baseline? Yes   Triage Complete: Triage complete  Chief Complaint ulcers  Triage Note Patient arrived via home with complaints with increased warmth and pain in right great toe since Friday 3/13 after popping blister. Reports drainage being bloody and pus filled that day, with no drainage since then. Denies fever, but reported foot feeling warm to touch and having difficulty applying pressure to right foot. History of diabetes, unable to afford insulin, complaints of increased thirst. Treatment with old prescription of Macrobid and Gentamycin. EDP Mesick at bedside.  Arsenio Loader, MEd, RN CEN 03/20/2018 4:54 PM    Allergies Allergies  Allergen Reactions  . Tramadol Other (See Comments)    Unknown; pt claims she doesn't recall having this allergy     Level of Care/Admitting Diagnosis ED Disposition    ED Disposition Condition Comment   Admit  Hospital Area: [facility] [[id number]]  Level of Care: Med-Surg [16]  I expect the patient will be discharged within 24 hours: No (not a candidate for 5C-Observation unit)  Diagnosis: Diabetic foot ulcer [facility]) [[id number]]  Admitting Physician: Charlsie Quest [[id number]]  Attending Physician: Charlsie Quest [[id number]]  PT Class (Do Not Modify): Observation [104]  PT Acc Code (Do Not Modify): Observation [10022]       B Medical/Surgery History Past Medical History:  Diagnosis Date  . Anxiety   . Depression   . Diabetes mellitus without complication (HCC)   . High cholesterol   . Hypertension   . Neuropathy   . PCOS (polycystic ovarian syndrome)    Past Surgical History:  Procedure Laterality Date  . ELBOW SURGERY    . INSERTION OF AHMED VALVE Right 09/07/2015    Procedure: INSERTION OF AHMED VALVE;  Surgeon: Edmon Crape, MD;  Location: [facility] OR;  Service: Ophthalmology;  Laterality: Right;  . LASER PHOTO ABLATION Right 09/07/2015   Procedure: LASER PHOTO ABLATION;  Surgeon: Edmon Crape, MD;  Location: [facility] OR;  Service: Ophthalmology;  Laterality: Right;  . PARS PLANA VITRECTOMY Right 09/07/2015   Procedure: PARS PLANA VITRECTOMY WITH 25 GAUGE,, with panretinal endolaser,;  Surgeon: Edmon Crape, MD;  Location: [facility] OR;  Service: Ophthalmology;  Laterality: Right;     A IV Location/Drains/Wounds Patient Lines/Drains/Airways Status   Active Line/Drains/Airways    Name:   Placement date:   Placement time:   Site:   Days:   Peripheral IV 03/20/18 Left Antecubital   03/20/18    1658    Antecubital   less than 1   Incision (Closed) 05/19/17 Back Lower;Mid   05/19/17    1930     305          Intake/Output Last 24 hours No intake or output data in the 24 hours ending 03/20/18 1850  Labs/Imaging Results for orders placed or performed during the hospital encounter of 03/20/18 (from the past 48 hour(s))  CBG monitoring, ED     Status: Abnormal   Collection Time: 03/20/18  4:57 PM  Result Value Ref Range   Glucose-Capillary 358 (H) 70 - 99 mg/dL   Comment 1 Notify RN    Comment 2 Document in Chart   Comprehensive metabolic panel     Status: Abnormal   Collection Time: 03/20/18  5:00 PM  Result Value Ref Range   Sodium 133 (L) 135 - 145 mmol/L   Potassium 5.8 (H) 3.5 - 5.1 mmol/L   Chloride 101 98 - 111 mmol/L   CO2 20 (L) 22 - 32 mmol/L   Glucose, Bld 375 (H) 70 - 99 mg/dL   BUN 12 6 - 20 mg/dL   Creatinine, Ser 7.74 0.44 - 1.00 mg/dL   Calcium 9.3 8.9 - 12.8 mg/dL   Total Protein 7.5 6.5 - 8.1 g/dL   Albumin 3.4 (L) 3.5 - 5.0 g/dL   AST 37 15 - 41 U/L   ALT 20 0 - 44 U/L   Alkaline Phosphatase 73 38 - 126 U/L   Total Bilirubin 1.7 (H) 0.3 - 1.2 mg/dL   GFR calc non Af Amer >60 >60 mL/min   GFR calc Af Amer >60 >60 mL/min   Anion gap 12 5 -  15    Comment: Performed at [facility], [address], [place], [place] [address]  CBC with Differential     Status: Abnormal   Collection Time: 03/20/18  5:00 PM  Result Value Ref Range   WBC 13.6 (H) 4.0 - 10.5 K/uL   RBC 5.04 3.87 - 5.11 MIL/uL   Hemoglobin 14.0 12.0 - 15.0 g/dL   HCT 72.0 94.7 - 09.6 %   MCV 83.1 80.0 - 100.0 fL   MCH 27.8 26.0 - 34.0 pg   MCHC 33.4 30.0 - 36.0 g/dL   RDW 28.3 66.2 - 94.7 %   Platelets 334 150 - 400 K/uL   nRBC 0.0 0.0 - 0.2 %   Neutrophils Relative % 69 %   Neutro Abs 9.5 (H) 1.7 - 7.7 K/uL   Lymphocytes Relative 20 %   Lymphs Abs 2.7 0.7 - 4.0 K/uL   Monocytes Relative 6 %   Monocytes Absolute 0.8 0.1 - 1.0 K/uL   Eosinophils Relative 4 %   Eosinophils Absolute 0.5 0.0 - 0.5 K/uL   Basophils Relative 1 %   Basophils Absolute 0.1 0.0 - 0.1 K/uL   Immature Granulocytes 0 %   Abs Immature Granulocytes 0.06 0.00 - 0.07 K/uL    Comment: Performed at [facility], [address], [place], [place] [address]  Lactic acid, plasma     Status: Abnormal   Collection Time: 03/20/18  5:07 PM  Result Value Ref Range   Lactic Acid, Venous 3.1 (HH) 0.5 - 1.9 mmol/L    Comment: CRITICAL RESULT CALLED TO, READ BACK BY AND VERIFIED WITH: Smith Robert 0354 03/20/2018 D BRADLEY Performed at [facility], [address] [place], [place] [address]    No results found.  Pending Labs Unresulted Labs (From admission, onward)    Start     Ordered   03/20/18 1658  Culture, blood (routine x 2)  BLOOD CULTURE X 2,   STAT     03/20/18 1658   03/20/18 1658  Lactic acid, plasma  Now then every 2 hours,   STAT     03/20/18 1658          Vitals/Pain Today's Vitals   03/20/18 1807 03/20/18 1811 03/20/18 1817 03/20/18 1830  BP:   123/88 118/84  Pulse: 96 (!) 101 (!) 101 100  Resp:      Temp:      TempSrc:      SpO2:      Weight:      Height:      PainSc:  Isolation Precautions No active  isolations  Medications Medications  insulin aspart (novoLOG) injection 6 Units (has no administration in time range)  piperacillin-tazobactam (ZOSYN) IVPB 3.375 g (0 g Intravenous Stopped 03/20/18 1820)  sodium chloride 0.9 % bolus 500 mL (500 mLs Intravenous New Bag/Given 03/20/18 1715)  sodium chloride 0.9 % bolus 500 mL (500 mLs Intravenous New Bag/Given 03/20/18 1820)    Mobility walks Low fall risk   Focused Assessments Musculoskeletal   R Recommendations: See Admitting Provider Note  Report given to: Trula Ore, RN  Additional Notes: Patient given zosyn and 1 liter of fluids. Tenderness to right great toe only with ambulation. Patient legally blind in right eye. Difficult IV stick. Currently in Xray.

## 2018-03-20 NOTE — ED Notes (Signed)
Hospitalist, Patel at bedside. 

## 2018-03-21 ENCOUNTER — Other Ambulatory Visit: Payer: Self-pay

## 2018-03-21 ENCOUNTER — Encounter (HOSPITAL_COMMUNITY): Payer: Self-pay

## 2018-03-21 LAB — GLUCOSE, CAPILLARY
GLUCOSE-CAPILLARY: 223 mg/dL — AB (ref 70–99)
GLUCOSE-CAPILLARY: 177 mg/dL — AB (ref 70–99)
Glucose-Capillary: 239 mg/dL — ABNORMAL HIGH (ref 70–99)
Glucose-Capillary: 203 mg/dL — ABNORMAL HIGH (ref 70–99)

## 2018-03-21 LAB — CBC
HCT: 34.2 % — ABNORMAL LOW (ref 36.0–46.0)
Hemoglobin: 11.7 g/dL — ABNORMAL LOW (ref 12.0–15.0)
MCH: 28.7 pg (ref 26.0–34.0)
MCHC: 34.2 g/dL (ref 30.0–36.0)
MCV: 84 fL (ref 80.0–100.0)
PLATELETS: 270 10*3/uL (ref 150–400)
RBC: 4.07 MIL/uL (ref 3.87–5.11)
RDW: 13.7 % (ref 11.5–15.5)
WBC: 11.4 10*3/uL — ABNORMAL HIGH (ref 4.0–10.5)
nRBC: 0 % (ref 0.0–0.2)

## 2018-03-21 LAB — BASIC METABOLIC PANEL
ANION GAP: 10 (ref 5–15)
BUN: 11 mg/dL (ref 6–20)
CO2: 24 mmol/L (ref 22–32)
Calcium: 8.4 mg/dL — ABNORMAL LOW (ref 8.9–10.3)
Chloride: 102 mmol/L (ref 98–111)
Creatinine, Ser: 0.87 mg/dL (ref 0.44–1.00)
GFR calc Af Amer: >60 mL/min (ref >60)
GLUCOSE: 235 mg/dL — AB (ref 70–99)
Potassium: 3.9 mmol/L (ref 3.5–5.1)
Sodium: 136 mmol/L (ref 135–145)

## 2018-03-21 LAB — MRSA PCR SCREENING: MRSA by PCR: NEGATIVE

## 2018-03-21 MED ORDER — INSULIN ASPART PROT & ASPART (70-30 MIX) 100 UNIT/ML ~~LOC~~ SUSP
12.0000 [IU] | Freq: Two times a day (BID) | SUBCUTANEOUS | Status: DC
  Administered 2018-03-21: 12 [IU] via SUBCUTANEOUS
  Filled 2018-03-21: qty 10

## 2018-03-21 NOTE — Plan of Care (Signed)
  Problem: Education: Goal: Knowledge of General Education information will improve Description Including pain rating scale, medication(s)/side effects and non-pharmacologic comfort measures Outcome: Progressing   Problem: Clinical Measurements: Goal: Respiratory complications will improve Outcome: Not Applicable Goal: Cardiovascular complication will be avoided Outcome: Progressing   Problem: Activity: Goal: Risk for activity intolerance will decrease Outcome: Progressing   Problem: Coping: Goal: Level of anxiety will decrease Outcome: Progressing   Problem: Pain Managment: Goal: General experience of comfort will improve Outcome: Progressing

## 2018-03-21 NOTE — Consult Note (Signed)
WOC Nurse wound consult note Reason for Consult: Bilateral great toe ulcerations. R>L.  Wound type: Infectious Pressure Injury POA: NA Measurement: Right: 2cm x 3cm x 0.1cm dry wound bed with remnants of blood-filled blister that patient punctured to deflate. Left: Intact, deflated blood-filled blister  Wound bed: Drainage (amount, consistency, odor) None Periwound: erythema surrounding RGT, but patient states it is improving. Warmth. No erythema or warmth at LGT Dressing procedure/placement/frequency: Patient is on systemic antibiotics. I will begin conservative antimicrobial care while in house, twice daily betadine swabstick applications after soap and water cleansing to dry and donate antimicrobial properties to wounds. Patient is established with both [facility] (Dr. [name] Bores, she saw him in 9/19) and with the [facility] (Dr. Leanord Hawking) where she went in 2017 for similar ulcerations. She reports that she lost her health insurance last September (9/19) and will not be able to get on husband's insurance until July 2020. She will resume wound care at the podiatry office at that time. She reports going barefoot nearly all of the time at home, saying he "hates shoes". She has off-loading, protective shoe wear and is instructed to use it at all times for ambulation and to keep feet clean and dry, performing daily foot inspections.   WOC nursing team will not follow, but will remain available to this patient, the nursing and medical teams.  Please re-consult if needed. Thanks, Ladona Mow, MSN, RN, GNP, [place]  Pager# [phone]

## 2018-03-21 NOTE — Progress Notes (Addendum)
Inpatient Diabetes Program Recommendations  AACE/ADA: New Consensus Statement on Inpatient Glycemic Control   Target Ranges:  Prepandial:   less than 140 mg/dL      Peak postprandial:   less than 180 mg/dL (1-2 hours)      Critically ill patients:  140 - 180 mg/dL   Results for [name], [name] (MRN [id number]) as of 03/21/2018 09:42  Ref. Range 03/20/2018 16:57 03/20/2018 22:42 03/21/2018 08:04  Glucose-Capillary Latest Ref Range: 70 - 99 mg/dL 856 (H) 314 (H) 970 (H)  Results for [name], [name] (MRN [id number]) as of 03/21/2018 09:42  Ref. Range 03/20/2018 17:00 03/20/2018 20:00 03/21/2018 03:51  Glucose Latest Ref Range: 70 - 99 mg/dL 027 (H)  741 (H)  Hemoglobin A1C Latest Ref Range: 4.8 - 5.6 %  10.9 (H)    Review of Glycemic Control  Diabetes history: DM2 Outpatient Diabetes medications: Metformin 1000 mg BID, Not taken Jardiance or Basaglar in over 6 months Current orders for Inpatient glycemic control: Novolog 0-15 units TID with meals, Novolog 0-5 units QHS  Inpatient Diabetes Program Recommendations:  Insulin -SQ: Please consider ordering 70/30 12 units BID (will provide a total of 16.8 units for basal and 7.2 units for meal coverage per day).  A1C: A1C 10.9% on 03/20/18 indicating an average glucose of 266 mg/dl over the past 2-3 months.  NOTE: In reviewing chart, noted patient has DM2 and per H&P patient is only taking Metformin for DM control. Has not taken Basaglar or Jardiance in over 6 months due to cost.  Recommend using NOVOLIN 70/30 insulin as an outpatient if cost is an issue since NOVOLIN 70/30 could be purchased at [facility] for $25 per vial or $43 per box of 5 insulin pens.  Will plan to see patient today to discuss A1C and issue with obtaining DM medications.  Addendum 03/21/18@11 :32-Spoke with patient about diabetes and home regimen for diabetes control. Patient reports that she currently has no insurance so she is not able to see old PCP or get needed DM medications.  Patient states that she is only taking Metformin 1000 mg BID as an outpatient for diabetes control. Patient reports she ran out of [address] about 6 months ago and she was not able to afford to get it refilled.   Discussed A1C results (10.9% on 03/20/18) and explained that current A1C indicates an average glucose of 266 mg/dl over the past 2-3 months. Discussed glucose and A1C goals. Discussed importance of checking CBGs and maintaining good CBG control to prevent long-term and short-term complications. Explained how hyperglycemia leads to damage within blood vessels which lead to the common complications seen with uncontrolled diabetes. Stressed to the patient the importance of improving glycemic control to prevent further complications from uncontrolled diabetes especially for wound healing. Patient reports that she wants to get DM better controlled but she could not afford insulin. Discussed NOVOLIN 70/30 insulin in detail and how it is typically taken BID with breakfast and supper.  Informed patient that Novolin 70/30 can be purchased at [facility] for $25 per vial or $43 per box of 5 insulin pens.  Provided patient with handout information on Reli-On products and encouraged patient to go to [facility] to get Novolin 70/30 insulin.  Informed patient that 70/30 12 units BID was being started today and that it would be requested that nurse give patient the bottle of 70/30 insulin used while inpatient at time of discharge. Explained that 70/30 insulin dose may need to be adjusted to get  DM well controlled as an outpatient. Patient states that she can not afford to see her old PCP due to lack of insurance. Discussed [facility] and encouraged patient to get established with a local clinic for follow up. Informed patient that CM was consulted and would assist with arranging follow up.   Patient reports that her glucometer is several years old and she needs to get a new  glucometer. Informed patient that Reli-On Prime glucometer is $9 at [facility] and box of 50 strips is $9.  Patient very appreciative of information discussed and states that she is thankful to know about the resources available for more affordable insulin at [facility].  Patient verbalized understanding of information discussed and she states that she has no further questions at this time related to diabetes.  Thanks, Orlando Penner, RN, MSN, CDE Diabetes Coordinator Inpatient Diabetes Program [phone] (Team Pager from 8am to 5pm)

## 2018-03-21 NOTE — Progress Notes (Signed)
PROGRESS NOTE  [name] QGB:[id number] DOB: [birth year] DOA: 03/20/2018 PCP: Marda Stalker, PA-C  HPI/Recap of past 24 hours: [name] is a 48 y.o. female with medical history significant for insulin-dependent diabetes complicated by diabetic retinopathy and peripheral neuropathy,, hypertension, hyperlipidemia, and depression/anxiety who presents to the ED with worsening of a right foot wound.  Patient reports chronic diabetic wounds of the first toes of both of her feet previously for which she has been treated at the wound care center and more recently with her podiatrist, Dr. Amalia Hailey.  She lost her insurance and has been unable to follow-up with outpatient care and has been unable to afford many of her medications including her insulin and psychiatric medications for the last 6 months.  She noticed developing a blood blister at her right first toe wound about 1 week ago which she popped at home with bloody and purulent discharge.  She had continued pain and noticed some overlying erythema, therefore presented to the ED for further evaluation.  She denies any associated fevers, chills, diaphoresis, chest pain, shortness of breath, abdominal pain.  ED Course:  Initial vitals in the ED showed BP 161/120, pulse 111, RR 18, temp 98.1 Fahrenheit, SPO2 99% on room air.   Labs are notable for WBC 13.6, hemoglobin 14.0, platelets 334, Sodium 133, potassium 5.8, bicarb 20, BUN 12, creatinine 0.88, serum glucose 375, anion gap 12, lactic acid 3.1 >> 2.0.  Right foot x-ray was negative for fracture, soft tissue gas, or acute osseous abnormality.  Blood cultures were collected and the patient was given 1 L normal saline, 6 units NovoLog, and started on IV Zosyn.  The hospitalist service was consulted to admit for further management.  03/21/18: Patient seen and examined at bedside.  No acute events overnight.  Persistent pain in her right first toe.  Wound care specialist has been consulted  and following.  Continue IV antibiotics and wound care in the setting of uncontrolled diabetes with hyperglycemia.  Assessment/Plan: Principal Problem:   Diabetic foot ulcer ([place]) Active Problems:   Insulin dependent diabetes mellitus ([place])   Hypertension   Hyperkalemia  Right first toe diabetic foot ulcer Presented with leukocytosis, elevated lactic acid X-ray without evidence of osseous or tissue gas involvement Initially on Zosyn Switched to IV Unasyn, continue Presented with ESR 25, CRP less than 0.8 Wound care specialist consulted and following Blood cultures x2 in process Monitor fever curve and WBC Obtain CBC in the morning Obtain MRSA screening Continue IV antibiotics and wound care  Type 2 diabetes complicated by hyperglycemia Diabetes coordinator following.  Appreciate recommendations Continue insulin 70/30 12 units twice daily Hemoglobin A1c 10.9% on 03/20/2018 Continue insulin sliding scale  Resolved hyperkalemia Presented with potassium of 5.8 Potassium 3.9 on 03/21/2018  Hyper lipidemia Continue Lipitor next GERD Continue Pepcid  Diabetes polyneuropathy Continue Neurontin  DVT prophylaxis:  Subcu Lovenox daily Code Status: Full code, confirmed with patient Family Communication:  Family member at bedside. Disposition Plan:  Possible discharge to home tomorrow 03/22/2018 Consults called:  Wound care specialist, diabetes coordinator   Admission status: Observation   Objective: Vitals:   03/20/18 2007 03/21/18 0016 03/21/18 0404 03/21/18 0926  BP: (!) 88/67 104/71 109/71 122/75  Pulse: (!) 109 81 85 79  Resp: _0 Temp: 98.7 F (37.1 C) 98.4 F (36.9 C) 98.4 F (36.9 C) 98 F (36.7 C)  TempSrc: Oral Oral Oral Oral  SpO2: 93% 99% 100% 100%  Weight:  Height:        Intake/Output Summary (Last 24 hours) at 03/21/2018 1139 Last data filed at 03/21/2018 0900 Gross per 24 hour  Intake 240 ml  Output -  Net 240 ml   Filed Weights    03/20/18 1656  Weight: 90.7 kg    Exam:  . General: 48 y.o. year-old female well developed well nourished in no acute distress.  Alert and oriented x3. . Cardiovascular: Regular rate and rhythm with no rubs or gallops.  No thyromegaly or JVD noted.   Marland Kitchen Respiratory: Clear to auscultation with no wheezes or rales. Good inspiratory effort. . Abdomen: Soft nontender nondistended with normal bowel sounds x4 quadrants. . Musculoskeletal: No lower extremity edema. 2/4 pulses in all 4 extremities. . Skin: First great toe bilaterally with diabetic ulcerative lesion. Marland Kitchen Psychiatry: Mood is appropriate for condition and setting   Data Reviewed: CBC: Recent Labs  Lab 03/20/18 1700 03/21/18 0351  WBC 13.6* 11.4*  NEUTROABS 9.5*  --   HGB 14.0 11.7*  HCT 41.9 34.2*  MCV 83.1 84.0  PLT 334 413   Basic Metabolic Panel: Recent Labs  Lab 03/20/18 1700 03/21/18 0351  NA 133* 136  K 5.8* 3.9  CL 101 102  CO2 20* 24  GLUCOSE 375* 235*  BUN 12 11  CREATININE 0.88 0.87  CALCIUM 9.3 8.4*   GFR: Estimated Creatinine Clearance: 89.1 mL/min (by C-G formula based on SCr of 0.87 mg/dL). Liver Function Tests: Recent Labs  Lab 03/20/18 1700  AST 37  ALT 20  ALKPHOS 73  BILITOT 1.7*  PROT 7.5  ALBUMIN 3.4*   No results for input(s): LIPASE, AMYLASE in the last 168 hours. No results for input(s): AMMONIA in the last 168 hours. Coagulation Profile: No results for input(s): INR, PROTIME in the last 168 hours. Cardiac Enzymes: No results for input(s): CKTOTAL, CKMB, CKMBINDEX, TROPONINI in the last 168 hours. BNP (last 3 results) No results for input(s): PROBNP in the last 8760 hours. HbA1C: Recent Labs    03/20/18 2000  HGBA1C 10.9*   CBG: Recent Labs  Lab 03/20/18 1657 03/20/18 2242 03/21/18 0804  GLUCAP 358* 262* 223*   Lipid Profile: No results for input(s): CHOL, HDL, LDLCALC, TRIG, CHOLHDL, LDLDIRECT in the last 72 hours. Thyroid Function Tests: No results for  input(s): TSH, T4TOTAL, FREET4, T3FREE, THYROIDAB in the last 72 hours. Anemia Panel: No results for input(s): VITAMINB12, FOLATE, FERRITIN, TIBC, IRON, RETICCTPCT in the last 72 hours. Urine analysis:    Component Value Date/Time   COLORURINE YELLOW 06/07/2017 1226   APPEARANCEUR CLEAR 06/07/2017 1226   LABSPEC 1.032 (H) 06/07/2017 1226   PHURINE 5.0 06/07/2017 1226   GLUCOSEU >=500 (A) 06/07/2017 1226   HGBUR NEGATIVE 06/07/2017 1226   BILIRUBINUR NEGATIVE 06/07/2017 1226   KETONESUR 80 (A) 06/07/2017 1226   PROTEINUR 30 (A) 06/07/2017 1226   UROBILINOGEN 0.2 11/28/2013 0023   NITRITE NEGATIVE 06/07/2017 1226   LEUKOCYTESUR NEGATIVE 06/07/2017 1226   Sepsis Labs: _0 (procalcitonin:4,lacticidven:4)  ) Recent Results (from the past 240 hour(s))  MRSA PCR Screening     Status: None   Collection Time: 03/21/18  8:59 AM  Result Value Ref Range Status   MRSA by PCR NEGATIVE NEGATIVE Final    Comment:        The GeneXpert MRSA Assay (FDA approved for NASAL specimens only), is one component of a comprehensive MRSA colonization surveillance program. It is not intended to diagnose MRSA infection nor to guide or monitor treatment for  MRSA infections. Performed at [facility], [place] [address], [place], [place] [address]       Studies: Dg Foot 2 Views Right  Result Date: 03/20/2018 CLINICAL DATA:  Diabetic foot ulcer EXAM: RIGHT FOOT - 2 VIEW COMPARISON:  05/26/2016 FINDINGS: Vascular calcifications. No fracture or malalignment. No gas in the soft tissues. Joint spaces are normal IMPRESSION: No acute osseous abnormality Electronically Signed   By: Donavan Foil M.D.   On: 03/20/2018 19:09    Scheduled Meds: . atorvastatin  40 mg Oral QPM  . enoxaparin (LOVENOX) injection  40 mg Subcutaneous Q24H  . famotidine  20 mg Oral BID  . gabapentin  1,600 mg Oral Daily   And  . gabapentin  3,200 mg Oral QHS  . insulin aspart  0-15 Units Subcutaneous TID WC  . insulin  aspart  0-5 Units Subcutaneous QHS  . insulin aspart protamine- aspart  12 Units Subcutaneous BID WC    Continuous Infusions: . ampicillin-sulbactam (UNASYN) IV 3 g (03/21/18 0900)     LOS: 0 days     Kayleen Memos, MD [facility] Pager [phone]  If 7PM-7AM, please contact night-coverage [email or web address] Password [facility] 03/21/2018, 11:39 AM

## 2018-03-21 NOTE — Plan of Care (Signed)
Problem: Education: Goal: Knowledge of General Education information will improve Description Including pain rating scale, medication(s)/side effects and non-pharmacologic comfort measures Outcome: Progressing   Problem: Health Behavior/Discharge Planning: Goal: Ability to manage health-related needs will improve Outcome: Progressing   Problem: Clinical Measurements: Goal: Will remain free from infection Outcome: Progressing Goal: Diagnostic test results will improve Outcome: Progressing   Problem: Activity: Goal: Risk for activity intolerance will decrease Outcome: Progressing   Problem: Nutrition: Goal: Adequate nutrition will be maintained Outcome: Progressing   Problem: Coping: Goal: Level of anxiety will decrease Outcome: Progressing   Problem: Elimination: Goal: Will not experience complications related to urinary retention Outcome: Progressing   Problem: Pain Managment: Goal: General experience of comfort will improve Outcome: Progressing   Problem: Safety: Goal: Ability to remain free from injury will improve Outcome: Progressing   Problem: Skin Integrity: Goal: Risk for impaired skin integrity will decrease Outcome: Progressing

## 2018-03-22 LAB — CBC WITH DIFFERENTIAL/PLATELET
Abs Immature Granulocytes: 0.04 10*3/uL (ref 0.00–0.07)
Basophils Absolute: 0.1 10*3/uL (ref 0.0–0.1)
Basophils Relative: 1 %
Eosinophils Absolute: 0.6 10*3/uL — ABNORMAL HIGH (ref 0.0–0.5)
Eosinophils Relative: 6 %
HCT: 33.9 % — ABNORMAL LOW (ref 36.0–46.0)
HEMOGLOBIN: 11.6 g/dL — AB (ref 12.0–15.0)
Immature Granulocytes: 0 %
LYMPHS PCT: 32 %
Lymphs Abs: 3 10*3/uL (ref 0.7–4.0)
MCH: 28.9 pg (ref 26.0–34.0)
MCHC: 34.2 g/dL (ref 30.0–36.0)
MCV: 84.3 fL (ref 80.0–100.0)
Monocytes Absolute: 0.7 10*3/uL (ref 0.1–1.0)
Monocytes Relative: 7 %
Neutro Abs: 5.1 10*3/uL (ref 1.7–7.7)
Neutrophils Relative %: 54 %
Platelets: 267 10*3/uL (ref 150–400)
RBC: 4.02 MIL/uL (ref 3.87–5.11)
RDW: 13.5 % (ref 11.5–15.5)
WBC: 9.5 10*3/uL (ref 4.0–10.5)
nRBC: 0 % (ref 0.0–0.2)

## 2018-03-22 LAB — GLUCOSE, CAPILLARY
GLUCOSE-CAPILLARY: 178 mg/dL — AB (ref 70–99)
Glucose-Capillary: 248 mg/dL — ABNORMAL HIGH (ref 70–99)
Glucose-Capillary: 228 mg/dL — ABNORMAL HIGH (ref 70–99)
Glucose-Capillary: 148 mg/dL — ABNORMAL HIGH (ref 70–99)
Glucose-Capillary: 118 mg/dL — ABNORMAL HIGH (ref 70–99)

## 2018-03-22 LAB — BASIC METABOLIC PANEL
Anion gap: 7 (ref 5–15)
BUN: 6 mg/dL (ref 6–20)
CO2: 25 mmol/L (ref 22–32)
Calcium: 8.5 mg/dL — ABNORMAL LOW (ref 8.9–10.3)
Chloride: 104 mmol/L (ref 98–111)
Creatinine, Ser: 0.68 mg/dL (ref 0.44–1.00)
GFR calc Af Amer: >60 mL/min (ref >60)
GFR calc non Af Amer: >60 mL/min (ref >60)
Glucose, Bld: 243 mg/dL — ABNORMAL HIGH (ref 70–99)
Potassium: 3.9 mmol/L (ref 3.5–5.1)
Sodium: 136 mmol/L (ref 135–145)

## 2018-03-22 LAB — MAGNESIUM: Magnesium: 1.6 mg/dL — ABNORMAL LOW (ref 1.7–2.4)

## 2018-03-22 MED ORDER — LACTATED RINGERS IV SOLN
INTRAVENOUS | Status: DC
  Administered 2018-03-22: 07:00:00 via INTRAVENOUS

## 2018-03-22 MED ORDER — GABAPENTIN 800 MG PO TABS: 1600.0000 mg | ORAL_TABLET | ORAL | 0 refills | Status: DC

## 2018-03-22 MED ORDER — GLUCOSE BLOOD VI STRP: 1.0000 | ORAL_STRIP | Freq: Two times a day (BID) | 0 refills | Status: DC

## 2018-03-22 MED ORDER — MAGNESIUM SULFATE 4 GM/100ML IV SOLN
4.0000 g | Freq: Once | INTRAVENOUS | Status: AC
  Administered 2018-03-22: 4 g via INTRAVENOUS
  Filled 2018-03-22: qty 100

## 2018-03-22 MED ORDER — GENTAMICIN SULFATE 0.1 % EX CREA: 1.0000 "application " | TOPICAL_CREAM | Freq: Three times a day (TID) | CUTANEOUS | 0 refills | Status: DC

## 2018-03-22 MED ORDER — AMOXICILLIN-POT CLAVULANATE 875-125 MG PO TABS: 1.0000 | ORAL_TABLET | Freq: Two times a day (BID) | ORAL | 0 refills | Status: DC

## 2018-03-22 MED ORDER — ATORVASTATIN CALCIUM 40 MG PO TABS: 40.0000 mg | ORAL_TABLET | Freq: Every evening | ORAL | 0 refills | Status: DC

## 2018-03-22 MED ORDER — INSULIN ASPART PROT & ASPART (70-30 MIX) 100 UNIT/ML ~~LOC~~ SUSP: 14.0000 [IU] | Freq: Two times a day (BID) | SUBCUTANEOUS | 0 refills | Status: DC

## 2018-03-22 MED ORDER — FAMOTIDINE 20 MG PO TABS: 20.0000 mg | ORAL_TABLET | Freq: Two times a day (BID) | ORAL | 0 refills | Status: DC

## 2018-03-22 MED ORDER — METFORMIN HCL 1000 MG PO TABS: 1000.0000 mg | ORAL_TABLET | Freq: Two times a day (BID) | ORAL | 0 refills | Status: DC

## 2018-03-22 MED ORDER — METFORMIN HCL 500 MG PO TABS
1000.0000 mg | ORAL_TABLET | Freq: Two times a day (BID) | ORAL | Status: DC
  Administered 2018-03-22 – 2018-03-23 (×2): 1000 mg via ORAL
  Filled 2018-03-22 (×3): qty 2

## 2018-03-22 MED ORDER — INSULIN ASPART PROT & ASPART (70-30 MIX) 100 UNIT/ML ~~LOC~~ SUSP
14.0000 [IU] | Freq: Two times a day (BID) | SUBCUTANEOUS | Status: DC
  Administered 2018-03-22 – 2018-03-23 (×3): 14 [IU] via SUBCUTANEOUS

## 2018-03-22 MED FILL — metFORMIN HCL 1000 MG TABS: 1000 | 30 days supply | Qty: 60 | Fill #0

## 2018-03-22 MED FILL — NOVOLOG MIX 70/30 VIAL: (70-30) 100 | 34 days supply | Qty: 10 | Fill #0

## 2018-03-22 MED FILL — AMOX-CLAV 875-125 MG TABLET: 875-125 | 7 days supply | Qty: 14 | Fill #0

## 2018-03-22 MED FILL — ULTICARE INS 0.3 ML 30GX1/2: 30G X 1/2" | 30 days supply | Qty: 60 | Fill #0

## 2018-03-22 MED FILL — HEARTBURN RELIEF 20 MG TAB: 20 | 25 days supply | Qty: 50 | Fill #0

## 2018-03-22 MED FILL — ATORVASTATIN CALCIUM 40 MG: 40 | 30 days supply | Qty: 30 | Fill #0

## 2018-03-22 NOTE — Plan of Care (Signed)
  Problem: Education: Goal: Knowledge of General Education information will improve Description Including pain rating scale, medication(s)/side effects and non-pharmacologic comfort measures Outcome: Progressing   Problem: Clinical Measurements: Goal: Will remain free from infection Outcome: Progressing   Problem: Clinical Measurements: Goal: Diagnostic test results will improve Outcome: Progressing   Problem: Coping: Goal: Level of anxiety will decrease Outcome: Progressing   Problem: Elimination: Goal: Will not experience complications related to bowel motility Outcome: Progressing   Problem: Elimination: Goal: Will not experience complications related to urinary retention Outcome: Progressing

## 2018-03-22 NOTE — Progress Notes (Addendum)
Inpatient Diabetes Program Recommendations  AACE/ADA: New Consensus Statement on Inpatient Glycemic Control   Target Ranges:  Prepandial:   less than 140 mg/dL      Peak postprandial:   less than 180 mg/dL (1-2 hours)      Critically ill patients:  140 - 180 mg/dL   Results for [name], [name] (MRN [id number]) as of 03/22/2018 10:36  Ref. Range 03/21/2018 08:04 03/21/2018 12:45 03/21/2018 17:24 03/21/2018 21:56  Glucose-Capillary Latest Ref Range: 70 - 99 mg/dL 947 (H)  Novolog 5 units 239 (H)  Novolog 5 units 203 (H)  Novolog 5 units  70/30 12 units 177 (H)   Review of Glycemic Control Diabetes history: DM2  Outpatient Diabetes medications: Metformin 1000 mg BID, Not taken Jardiance or Basaglar in over 6 months Current orders for Inpatient glycemic control: 70/30 14 units BID, Novolog 0-15 units TID with meals, Novolog 0-5 units QHS   Inpatient Diabetes Program Recommendations:   Insulin - Basal: Noted 70/30 12 units given once on 03/21/18 and 70/30 was increased to 14 units BID starting today. Agree with changes made today. Recommend discharging patient on NOVOLIN 70/30 insulin dosage comparable to inpatient dose if glucose is fairly well controlled.  NOTE: In reviewing chart, noted patient has DM2 and per H&P patient is only taking Metformin for DM control. Spoke with patient on 03/21/18 (see note for details) and patient has not taken Basaglar or Jardiance in over 6 months due to cost. Recommend discharging on NOVOLIN 70/30 since it can be purchased at [facility] for $25 per vial or $43 per box of 5 insulin pens.   Thanks, Orlando Penner, RN, MSN, CDE Diabetes Coordinator Inpatient Diabetes Program [phone] (Team Pager from 8am to 5pm)

## 2018-03-22 NOTE — TOC Progression Note (Signed)
Transition of Care [facility]) - Progression Note    Patient Details  Name: [name] MRN: [id number] Date of Birth: [birth year]  Transition of Care [facility]) CM/SW Contact  Nadene Rubins Adria Devon, RN Phone Number: 03/22/2018, 11:08 AM  Clinical Narrative:     Follow up appointment scheduled at [facility]. On discharge instructions and patient has information.   Patient entered in [facility] system, prescriptions sent to transitions of care pharmacy. Patient states she has $3 co pay per prescription.   Expected Discharge Plan: Home w Home Health Services    Expected Discharge Plan and Services Expected Discharge Plan: Home w Home Health Services In-house Referral: Artist Discharge Planning Services: CM Consult, Indigent Health Clinic, [facility] Program Post Acute Care Choice: NA Living arrangements for the past 2 months: Single Family Home                 DME Arranged: N/A DME Agency: NA HH Arranged: NA HH Agency: NA   Social Determinants of Health (SDOH) Interventions    Readmission Risk Interventions No flowsheet data found.

## 2018-03-22 NOTE — Progress Notes (Signed)
PROGRESS NOTE  [name] HJS:[id number] DOB: 1970/02/05 DOA: 03/20/2018 PCP: Marda Stalker, PA-C  HPI/Recap of past 24 hours: [name] is a 48 y.o. female with medical history significant for insulin-dependent diabetes complicated by diabetic retinopathy and peripheral neuropathy,, hypertension, hyperlipidemia, and depression/anxiety who presents to the ED with worsening of a right foot wound.  Patient reports chronic diabetic wounds of the first toes of both of her feet previously for which she has been treated at the wound care center and more recently with her podiatrist, Dr. Amalia Hailey.  She lost her insurance and has been unable to follow-up with outpatient care and has been unable to afford many of her medications including her insulin and psychiatric medications for the last 6 months.  She noticed developing a blood blister at her right first toe wound about 1 week ago which she popped at home with bloody and purulent discharge.  She had continued pain and noticed some overlying erythema, therefore presented to the ED for further evaluation.  She denies any associated fevers, chills, diaphoresis, chest pain, shortness of breath, abdominal pain.  ED Course:  Initial vitals in the ED showed BP 161/120, pulse 111, RR 18, temp 98.1 Fahrenheit, SPO2 99% on room air.   Labs are notable for WBC 13.6, hemoglobin 14.0, platelets 334, Sodium 133, potassium 5.8, bicarb 20, BUN 12, creatinine 0.88, serum glucose 375, anion gap 12, lactic acid 3.1 >> 2.0.  Right foot x-ray was negative for fracture, soft tissue gas, or acute osseous abnormality.  Blood cultures were collected and the patient was given 1 L normal saline, 6 units NovoLog, and started on IV Zosyn.  The hospitalist service was consulted to admit for further management.  03/21/18: Patient seen and examined at bedside.  No acute events overnight.  Persistent pain in her right first toe.  Wound care specialist has been consulted  and following.  Continue IV antibiotics and wound care in the setting of uncontrolled diabetes with hyperglycemia.  03/22/18: Seen and examined at her bedside. Morning BS still uncontrolled. Increased 70/30 insulin.  Toes pain is improving.  Assessment/Plan: Principal Problem:   Diabetic foot ulcer ([place]) Active Problems:   Insulin dependent diabetes mellitus ([place])   Hypertension   Hyperkalemia  Right first toe diabetic foot ulcer Presented with leukocytosis, elevated lactic acid X-ray without evidence of osseous or tissue gas involvement Initially on Zosyn Switched to IV Unasyn, continue Presented with ESR 25, CRP less than 0.8 Wound care specialist consulted and following Blood cultures x2 in process Monitor fever curve and WBC Labs and VS reviewed and stable Negative MRSA screening Continue IV antibiotics and wound care  Type 2 diabetes complicated by hyperglycemia Diabetes coordinator following.  Appreciate recommendations Continue insulin 70/30, increase to 14U BID Hemoglobin A1c 10.9% on 03/20/2018 Continue insulin sliding scale Start LR 75cc/hr  Resolved hyperkalemia Presented with potassium of 5.8 Potassium 3.9 on 03/21/2018  Hypomagnesemia Mg2+ 1.6 repleted w mg2+ 4 g once  Hyper lipidemia Continue Lipitor next GERD Continue Pepcid  Diabetes polyneuropathy Continue Neurontin  DVT prophylaxis:  Subcu Lovenox daily Code Status: Full code, confirmed with patient Family Communication:  Family member at bedside. Disposition Plan:  Possible discharge to home tomorrow 03/23/2018 Consults called:  Wound care specialist, diabetes coordinator   Admission status: Observation   Objective: Vitals:   03/21/18 1249 03/21/18 1727 03/22/18 0415 03/22/18 0430  BP: 117/87 117/83 123/68 123/68  Pulse: 84 88 85 85  Resp: _0 Temp:  98.1 F (36.7 C) 98.5 F (36.9 C) 98.3 F (36.8 C) 98.3 F (36.8 C)  TempSrc: Oral Oral Oral Oral  SpO2: 99% 99% 100% 100%   Weight:      Height:        Intake/Output Summary (Last 24 hours) at 03/22/2018 1052 Last data filed at 03/22/2018 0646 Gross per 24 hour  Intake 944.49 ml  Output -  Net 944.49 ml   Filed Weights   03/20/18 1656  Weight: 90.7 kg    Exam:  . General: 48 y.o. year-old female WD WN NAD A&O x3 . Cardiovascular: RRR no rubs or gallops No JVD or thyromegaly . Respiratory: CTA no wheezes or rales. Good inspiratory efforts . Abdomen: Soft nontender nondistended with normal bowel sounds x4 quadrants. . Musculoskeletal: No lower extremity edema. 2/4 pulses in all 4 extremities. . Skin: First great toe bilaterally with diabetic ulcerative lesion. Marland Kitchen Psychiatry: Mood is appropriate for condition and setting   Data Reviewed: CBC: Recent Labs  Lab 03/20/18 1700 03/21/18 0351 03/22/18 0147  WBC 13.6* 11.4* 9.5  NEUTROABS 9.5*  --  5.1  HGB 14.0 11.7* 11.6*  HCT 41.9 34.2* 33.9*  MCV 83.1 84.0 84.3  PLT 334 270 945   Basic Metabolic Panel: Recent Labs  Lab 03/20/18 1700 03/21/18 0351 03/22/18 0147  NA 133* 136 136  K 5.8* 3.9 3.9  CL 101 102 104  CO2 20* 24 25  GLUCOSE 375* 235* 243*  BUN _0 CREATININE 0.88 0.87 0.68  CALCIUM 9.3 8.4* 8.5*  MG  --   --  1.6*   GFR: Estimated Creatinine Clearance: 96.9 mL/min (by C-G formula based on SCr of 0.68 mg/dL). Liver Function Tests: Recent Labs  Lab 03/20/18 1700  AST 37  ALT 20  ALKPHOS 73  BILITOT 1.7*  PROT 7.5  ALBUMIN 3.4*   No results for input(s): LIPASE, AMYLASE in the last 168 hours. No results for input(s): AMMONIA in the last 168 hours. Coagulation Profile: No results for input(s): INR, PROTIME in the last 168 hours. Cardiac Enzymes: No results for input(s): CKTOTAL, CKMB, CKMBINDEX, TROPONINI in the last 168 hours. BNP (last 3 results) No results for input(s): PROBNP in the last 8760 hours. HbA1C: Recent Labs    03/20/18 2000  HGBA1C 10.9*   CBG: Recent Labs  Lab 03/21/18 0804 03/21/18  1245 03/21/18 1724 03/21/18 2156 03/22/18 0815  GLUCAP 223* 239* 203* 177* 248*   Lipid Profile: No results for input(s): CHOL, HDL, LDLCALC, TRIG, CHOLHDL, LDLDIRECT in the last 72 hours. Thyroid Function Tests: No results for input(s): TSH, T4TOTAL, FREET4, T3FREE, THYROIDAB in the last 72 hours. Anemia Panel: No results for input(s): VITAMINB12, FOLATE, FERRITIN, TIBC, IRON, RETICCTPCT in the last 72 hours. Urine analysis:    Component Value Date/Time   COLORURINE YELLOW 06/07/2017 1226   APPEARANCEUR CLEAR 06/07/2017 1226   LABSPEC 1.032 (H) 06/07/2017 1226   PHURINE 5.0 06/07/2017 1226   GLUCOSEU >=500 (A) 06/07/2017 1226   HGBUR NEGATIVE 06/07/2017 1226   BILIRUBINUR NEGATIVE 06/07/2017 1226   KETONESUR 80 (A) 06/07/2017 1226   PROTEINUR 30 (A) 06/07/2017 1226   UROBILINOGEN 0.2 11/28/2013 0023   NITRITE NEGATIVE 06/07/2017 1226   LEUKOCYTESUR NEGATIVE 06/07/2017 1226   Sepsis Labs: _1 (procalcitonin:4,lacticidven:4)  ) Recent Results (from the past 240 hour(s))  Culture, blood (routine x 2)     Status: None (Preliminary result)   Collection Time: 03/20/18  4:59 PM  Result Value Ref Range Status  Specimen Description BLOOD LEFT ANTECUBITAL  Final   Special Requests   Final    BOTTLES DRAWN AEROBIC AND ANAEROBIC Blood Culture adequate volume   Culture   Final    NO GROWTH < 24 HOURS Performed at [facility], [address], [place], [place] [address]    Report Status PENDING  Incomplete  Culture, blood (routine x 2)     Status: None (Preliminary result)   Collection Time: 03/20/18  5:11 PM  Result Value Ref Range Status   Specimen Description BLOOD LEFT ANTECUBITAL  Final   Special Requests   Final    BOTTLES DRAWN AEROBIC AND ANAEROBIC Blood Culture results may not be optimal due to an inadequate volume of blood received in culture bottles   Culture   Final    NO GROWTH < 24 HOURS Performed at [facility], [place] [address],  [place], [place] [address]    Report Status PENDING  Incomplete  MRSA PCR Screening     Status: None   Collection Time: 03/21/18  8:59 AM  Result Value Ref Range Status   MRSA by PCR NEGATIVE NEGATIVE Final    Comment:        The GeneXpert MRSA Assay (FDA approved for NASAL specimens only), is one component of a comprehensive MRSA colonization surveillance program. It is not intended to diagnose MRSA infection nor to guide or monitor treatment for MRSA infections. Performed at [facility], [place] [address], [place], [place] [address]       Studies: No results found.  Scheduled Meds: . atorvastatin  40 mg Oral QPM  . enoxaparin (LOVENOX) injection  40 mg Subcutaneous Q24H  . famotidine  20 mg Oral BID  . gabapentin  1,600 mg Oral Daily   And  . gabapentin  3,200 mg Oral QHS  . insulin aspart  0-15 Units Subcutaneous TID WC  . insulin aspart  0-5 Units Subcutaneous QHS  . insulin aspart protamine- aspart  14 Units Subcutaneous BID WC  . metFORMIN  1,000 mg Oral BID WC    Continuous Infusions: . ampicillin-sulbactam (UNASYN) IV 3 g (03/22/18 0816)  . lactated ringers 75 mL/hr at 03/22/18 3953     LOS: 1 day     Kayleen Memos, MD [facility] Pager [phone]  If 7PM-7AM, please contact night-coverage [email or web address] Password [facility] 03/22/2018, 10:52 AM

## 2018-03-22 NOTE — Plan of Care (Signed)
  Problem: Clinical Measurements: Goal: Cardiovascular complication will be avoided Outcome: Progressing   Problem: Coping: Goal: Level of anxiety will decrease Outcome: Progressing   Problem: Safety: Goal: Ability to remain free from injury will improve Outcome: Progressing   Problem: Skin Integrity: Goal: Risk for impaired skin integrity will decrease Outcome: Progressing   

## 2018-03-23 DIAGNOSIS — R739 Hyperglycemia, unspecified: Secondary | ICD-10-CM

## 2018-03-23 DIAGNOSIS — E875 Hyperkalemia: Secondary | ICD-10-CM

## 2018-03-23 LAB — GLUCOSE, CAPILLARY
Glucose-Capillary: 239 mg/dL — ABNORMAL HIGH (ref 70–99)
Glucose-Capillary: 213 mg/dL — ABNORMAL HIGH (ref 70–99)

## 2018-03-23 MED ORDER — AMOXICILLIN-POT CLAVULANATE 875-125 MG PO TABS: 1.0000 | ORAL_TABLET | Freq: Two times a day (BID) | ORAL | 0 refills | Status: AC

## 2018-03-23 MED ORDER — METFORMIN HCL 1000 MG PO TABS: 1000.0000 mg | ORAL_TABLET | Freq: Two times a day (BID) | ORAL | 1 refills | Status: DC

## 2018-03-23 NOTE — Care Management (Signed)
Patient has prescriptions at bedside for d/c today. Scripts filled by [facility] pharmacy yesterday.

## 2018-03-23 NOTE — Progress Notes (Signed)
Discharge paperwork and instructions given to pt and pt's husband. Patient not in distress and tolerated well.

## 2018-03-23 NOTE — Discharge Summary (Signed)
Physician Discharge Summary  LAKASHIA COLLISON DEY:[id number] DOB: [birth year] DOA: 03/20/2018  PCP: Marda Stalker, PA-C  Admit date: 03/20/2018 Discharge date: 03/23/2018  Admitted From: Home Disposition: Home Recommendations for Outpatient Follow-up:  1. Follow up with PCP in 1-2 weeks 2. Please obtain BMP/CBC in one week  Home Health: None  equipment/Devices: None  Discharge Condition stable and improved  CODE STATUS full code  diet recommendation: Cardiac Brief/Interim Summary:48 y.o.femalewith medical history significant forinsulin-dependent diabetes complicated by diabetic retinopathy and peripheral neuropathy,, hypertension, hyperlipidemia, and depression/anxiety who presents to the ED with worsening ofaright foot wound. Patient reports chronic diabetic wounds of the first toes of both of her feet previously for which she has been treated at the wound care center and more recently with her podiatrist, Dr. Amalia Hailey. She lost her insurance and has been unable to follow-up with outpatient care and has been unable to afford many of her medications including her insulin and psychiatric medications for the last 6 months. She noticed developing a blood blister at her right first toe wound about 1 week ago which she popped at home with bloody and purulent discharge. She had continued pain and noticed some overlying erythema, therefore presented to the ED for further evaluation. She denies any associated fevers, chills, diaphoresis, chest pain, shortness of breath, abdominal pain.  ED Course: Initial vitals in the ED showed BP 161/120, pulse 111, RR 18, temp 98.1 Fahrenheit, SPO2 99% on room air.  Labs are notable for WBC 13.6, hemoglobin 14.0, platelets 334,Sodium 133, potassium 5.8, bicarb 20, BUN 12, creatinine 0.88, serum glucose 375, anion gap 12, lactic acid 3.1 >> 2.0.  Right foot x-ray was negative for fracture, soft tissue gas, or acute osseous abnormality. Blood cultures  were collected and the patient was given 1 L normal saline, 6 units NovoLog, and started on IV Zosyn. The hospitalist service was consulted to admit for further management.  03/21/18: Patient seen and examined at bedside.  No acute events overnight.  Persistent pain in her right first toe.  Wound care specialist has been consulted and following.  Continue IV antibiotics and wound care in the setting of uncontrolled diabetes with hyperglycemia.  03/22/18: Seen and examined at her bedside. Morning BS still uncontrolled. Increased 70/30 insulin.  Toes pain is improving.   Discharge Diagnoses:  Principal Problem:   Diabetic foot ulcer ([place]) Active Problems:   Insulin dependent diabetes mellitus ([place])   Hypertension   Hyperkalemia  Right first toe diabetic foot ulcer Presented with leukocytosis, elevated lactic acid X-ray without evidence of osseous or tissue gas involvement Initially on Zosyn Switched to IV Unasyn, continue Presented with ESR 25, CRP less than 0.8 Wound care specialist consulted -while in-house patient was treated with twice daily Betadine swab stick application after soap and water cleansing.  Upon discharge she will do wet-to-dry dressing.  She is also instructed to keep her offloading protective shoe wear at home at all times while ambulating and to keep the foot clean and dry and perform daily foot inspections.  She also has deflated blood blister in the left great toe. Blood cultures x2 negative to date Negative MRSA screening Discharge patient on Augmentin for 7 more days.  Type 2 diabetes complicated by hyperglycemia-patient reports she does not have insurance.  Followed by diabetic coordinator during hospital stay.  Continue 70/30 insulin with metformin.  Resolved hyperkalemia Presented with potassium of 5.8 Potassium 3.9 on 03/21/2018  Hyper lipidemia Continue Lipitor next GERD Continue Pepcid  Diabetes polyneuropathy Continue  Neurontin  Estimated  body mass index is 34.33 kg/m as calculated from the following:   Height as of this encounter: _0  (1.626 m).   Weight as of this encounter: 90.7 kg.  Discharge Instructions  Discharge Instructions    Call MD for:  persistant nausea and vomiting   Complete by:  As directed    Call MD for:  redness, tenderness, or signs of infection (pain, swelling, redness, odor or green/yellow discharge around incision site)   Complete by:  As directed    Call MD for:  temperature >100.4   Complete by:  As directed    Diet - low sodium heart healthy   Complete by:  As directed    Increase activity slowly   Complete by:  As directed      Allergies as of 03/23/2018      Reactions   Tramadol Other (See Comments)   Unknown; pt claims she doesn't recall having this allergy      Medication List    STOP taking these medications   Basaglar KwikPen 100 UNIT/ML Sopn   busPIRone 10 MG tablet Commonly known as:  BUSPAR   celecoxib 200 MG capsule Commonly known as:  CELEBREX   FLUoxetine 40 MG capsule Commonly known as:  PROZAC   hydrOXYzine 25 MG tablet Commonly known as:  ATARAX/VISTARIL   Jardiance 10 MG Tabs tablet Generic drug:  empagliflozin   naproxen sodium 220 MG tablet Commonly known as:  ALEVE   ondansetron 4 MG tablet Commonly known as:  ZOFRAN   spironolactone 100 MG tablet Commonly known as:  ALDACTONE     TAKE these medications   amoxicillin-clavulanate 875-125 MG tablet Commonly known as:  Augmentin Take 1 tablet by mouth 2 (two) times daily for 7 days.   aspirin EC 81 MG tablet Take 81 mg by mouth 2 (two) times daily.   atorvastatin 40 MG tablet Commonly known as:  LIPITOR Take 1 tablet (40 mg total) by mouth every evening.   famotidine 20 MG tablet Commonly known as:  PEPCID Take 1 tablet (20 mg total) by mouth 2 (two) times daily.   gabapentin 800 MG tablet Commonly known as:  NEURONTIN Take 2-4 tablets (1,600-3,200 mg total) by mouth See admin  instructions. Take 1600 mg by mouth in the morning and take 3200 mg by mouth in the evening.   gentamicin cream 0.1 % Commonly known as:  GARAMYCIN Apply 1 application topically 3 (three) times daily.   glucose blood test strip Commonly known as:  ONE TOUCH ULTRA TEST 1 each by Other route 2 (two) times daily. And lancets 2/day.   insulin aspart protamine- aspart (70-30) 100 UNIT/ML injection Commonly known as:  NOVOLOG MIX 70/30 Inject 0.14 mLs (14 Units total) into the skin 2 (two) times daily with a meal.   metFORMIN 1000 MG tablet Commonly known as:  GLUCOPHAGE Take 1 tablet (1,000 mg total) by mouth 2 (two) times daily with a meal.   MULTIVITAMIN GUMMIES ADULT PO Take 1 capsule by mouth daily.      Follow-up [place] Follow up.   Why:  follow up appointment March 29, 2018 at 1130 am  Contact information: [place] [id number] [phone]         Allergies  Allergen Reactions  . Tramadol Other (See Comments)    Unknown; pt claims she doesn't recall having this allergy     Consultations:  Wound care consult   Procedures/Studies: Dg Foot 2 Views Right  Result Date: 03/20/2018 CLINICAL DATA:  Diabetic foot ulcer EXAM: RIGHT FOOT - 2 VIEW COMPARISON:  05/26/2016 FINDINGS: Vascular calcifications. No fracture or malalignment. No gas in the soft tissues. Joint spaces are normal IMPRESSION: No acute osseous abnormality Electronically Signed   By: Donavan Foil M.D.   On: 03/20/2018 19:09    (Echo, Carotid, EGD, Colonoscopy, ERCP)    Subjective: She is awake alert anxious to go home but concerned about right foot has no insurance so has not been taking insulin [facility]  Discharge Exam: Vitals:   03/22/18 1958 03/23/18 0426  BP: 122/76 131/78  Pulse: 78 89  Resp: 15 18  Temp: 97.9 F (36.6 C) 97.9 F (36.6  C)  SpO2: 96% 100%   Vitals:   03/22/18 1152 03/22/18 1900 03/22/18 1958 03/23/18 0426  BP: 118/76 127/76 122/76 131/78  Pulse: 86 89 78 89  Resp: _0 Temp: 98.2 F (36.8 C) 98.5 F (36.9 C) 97.9 F (36.6 C) 97.9 F (36.6 C)  TempSrc: Oral Oral Oral Oral  SpO2: 99% 99% 96% 100%  Weight:      Height:        General: Pt is alert, awake, not in acute distress Cardiovascular: RRR, S1/S2 +, no rubs, no gallops Respiratory: CTA bilaterally, no wheezing, no rhonchi Abdominal: Soft, NT, ND, bowel sounds + Extremities: Right great toe covered with dressing left great toe with blood-filled blisters   The results of significant diagnostics from this hospitalization (including imaging, microbiology, ancillary and laboratory) are listed below for reference.     Microbiology: Recent Results (from the past 240 hour(s))  Culture, blood (routine x 2)     Status: None (Preliminary result)   Collection Time: 03/20/18  4:59 PM  Result Value Ref Range Status   Specimen Description BLOOD LEFT ANTECUBITAL  Final   Special Requests   Final    BOTTLES DRAWN AEROBIC AND ANAEROBIC Blood Culture adequate volume   Culture   Final    NO GROWTH 3 DAYS Performed at [facility], [address] [place], [place] [address]    Report Status PENDING  Incomplete  Culture, blood (routine x 2)     Status: None (Preliminary result)   Collection Time: 03/20/18  5:11 PM  Result Value Ref Range Status   Specimen Description BLOOD LEFT ANTECUBITAL  Final   Special Requests   Final    BOTTLES DRAWN AEROBIC AND ANAEROBIC Blood Culture results may not be optimal due to an inadequate volume of blood received in culture bottles   Culture   Final    NO GROWTH 3 DAYS Performed at [facility], [place] [address] [place], [place] [address]    Report Status PENDING  Incomplete  MRSA PCR Screening     Status: None   Collection Time: 03/21/18  8:59 AM  Result Value Ref Range Status   MRSA by PCR  NEGATIVE NEGATIVE Final    Comment:        The GeneXpert MRSA Assay (FDA approved for NASAL specimens only), is one component of a comprehensive MRSA colonization surveillance program. It is not intended to diagnose MRSA infection nor to guide or monitor treatment for MRSA infections. Performed at [facility], [place] [address] [place],  [address]      Labs: BNP (last 3 results)  No results for input(s): BNP in the last 8760 hours. Basic Metabolic Panel: Recent Labs  Lab 03/20/18 1700 03/21/18 0351 03/22/18 0147  NA 133* 136 136  K 5.8* 3.9 3.9  CL 101 102 104  CO2 20* 24 25  GLUCOSE 375* 235* 243*  BUN _0 CREATININE 0.88 0.87 0.68  CALCIUM 9.3 8.4* 8.5*  MG  --   --  1.6*   Liver Function Tests: Recent Labs  Lab 03/20/18 1700  AST 37  ALT 20  ALKPHOS 73  BILITOT 1.7*  PROT 7.5  ALBUMIN 3.4*   No results for input(s): LIPASE, AMYLASE in the last 168 hours. No results for input(s): AMMONIA in the last 168 hours. CBC: Recent Labs  Lab 03/20/18 1700 03/21/18 0351 03/22/18 0147  WBC 13.6* 11.4* 9.5  NEUTROABS 9.5*  --  5.1  HGB 14.0 11.7* 11.6*  HCT 41.9 34.2* 33.9*  MCV 83.1 84.0 84.3  PLT 334 270 267   Cardiac Enzymes: No results for input(s): CKTOTAL, CKMB, CKMBINDEX, TROPONINI in the last 168 hours. BNP: Invalid input(s): POCBNP CBG: Recent Labs  Lab 03/22/18 1150 03/22/18 1310 03/22/18 1714 03/22/18 2121 03/23/18 0732  GLUCAP 228* 148* 178* 118* 239*   D-Dimer No results for input(s): DDIMER in the last 72 hours. Hgb A1c Recent Labs    03/20/18 2000  HGBA1C 10.9*   Lipid Profile No results for input(s): CHOL, HDL, LDLCALC, TRIG, CHOLHDL, LDLDIRECT in the last 72 hours. Thyroid function studies No results for input(s): TSH, T4TOTAL, T3FREE, THYROIDAB in the last 72 hours.  Invalid input(s): FREET3 Anemia work up No results for input(s): VITAMINB12, FOLATE, FERRITIN, TIBC, IRON, RETICCTPCT in the last 72  hours. Urinalysis    Component Value Date/Time   COLORURINE YELLOW 06/07/2017 1226   APPEARANCEUR CLEAR 06/07/2017 1226   LABSPEC 1.032 (H) 06/07/2017 1226   PHURINE 5.0 06/07/2017 1226   GLUCOSEU >=500 (A) 06/07/2017 1226   HGBUR NEGATIVE 06/07/2017 1226   BILIRUBINUR NEGATIVE 06/07/2017 1226   KETONESUR 80 (A) 06/07/2017 1226   PROTEINUR 30 (A) 06/07/2017 1226   UROBILINOGEN 0.2 11/28/2013 0023   NITRITE NEGATIVE 06/07/2017 1226   LEUKOCYTESUR NEGATIVE 06/07/2017 1226   Sepsis Labs Invalid input(s): PROCALCITONIN,  WBC,  LACTICIDVEN Microbiology Recent Results (from the past 240 hour(s))  Culture, blood (routine x 2)     Status: None (Preliminary result)   Collection Time: 03/20/18  4:59 PM  Result Value Ref Range Status   Specimen Description BLOOD LEFT ANTECUBITAL  Final   Special Requests   Final    BOTTLES DRAWN AEROBIC AND ANAEROBIC Blood Culture adequate volume   Culture   Final    NO GROWTH 3 DAYS Performed at [facility], [place] [address] [place], [place] [address]    Report Status PENDING  Incomplete  Culture, blood (routine x 2)     Status: None (Preliminary result)   Collection Time: 03/20/18  5:11 PM  Result Value Ref Range Status   Specimen Description BLOOD LEFT ANTECUBITAL  Final   Special Requests   Final    BOTTLES DRAWN AEROBIC AND ANAEROBIC Blood Culture results may not be optimal due to an inadequate volume of blood received in culture bottles   Culture   Final    NO GROWTH 3 DAYS Performed at [facility], [place] [address], [place], [place] [address]    Report Status PENDING  Incomplete  MRSA PCR Screening     Status: None  Collection Time: 03/21/18  8:59 AM  Result Value Ref Range Status   MRSA by PCR NEGATIVE NEGATIVE Final    Comment:        The GeneXpert MRSA Assay (FDA approved for NASAL specimens only), is one component of a comprehensive MRSA colonization surveillance program. It is not intended to diagnose MRSA infection  nor to guide or monitor treatment for MRSA infections. Performed at [facility], [place] [address], [place], [place] [address]      Time coordinating discharge: 33 minutes  SIGNED:   Georgette Shell, MD  [facility] 03/23/2018, 9:03 AM Pager   If 7PM-7AM, please contact night-coverage [email or web address] Password [email or web address]

## 2018-03-23 NOTE — Plan of Care (Signed)
  Problem: Education: Goal: Knowledge of General Education information will improve Description: Including pain rating scale, medication(s)/side effects and non-pharmacologic comfort measures Outcome: Progressing   Problem: Health Behavior/Discharge Planning: Goal: Ability to manage health-related needs will improve Outcome: Progressing   Problem: Clinical Measurements: Goal: Ability to maintain clinical measurements within normal limits will improve Outcome: Progressing Goal: Will remain free from infection Outcome: Progressing Goal: Diagnostic test results will improve Outcome: Progressing Goal: Cardiovascular complication will be avoided Outcome: Progressing   Problem: Coping: Goal: Level of anxiety will decrease Outcome: Progressing   Problem: Elimination: Goal: Will not experience complications related to bowel motility Outcome: Progressing Goal: Will not experience complications related to urinary retention Outcome: Progressing   Problem: Safety: Goal: Ability to remain free from injury will improve Outcome: Progressing   Problem: Skin Integrity: Goal: Risk for impaired skin integrity will decrease Outcome: Progressing   

## 2018-03-23 NOTE — Discharge Instructions (Signed)
Wet-to-dry dressing to right big toe daily [facility]

## 2018-03-25 LAB — CULTURE, BLOOD (ROUTINE X 2)
CULTURE: NO GROWTH
Culture: NO GROWTH
SPECIAL REQUESTS: ADEQUATE

## 2018-03-29 ENCOUNTER — Inpatient Hospital Stay: Payer: Self-pay

## 2018-04-09 ENCOUNTER — Telehealth: Payer: Self-pay | Admitting: *Deleted

## 2018-04-10 ENCOUNTER — Emergency Department (HOSPITAL_COMMUNITY): Payer: Self-pay

## 2018-04-10 ENCOUNTER — Encounter (HOSPITAL_COMMUNITY): Payer: Self-pay | Admitting: Emergency Medicine

## 2018-04-10 ENCOUNTER — Other Ambulatory Visit: Payer: Self-pay

## 2018-04-10 ENCOUNTER — Emergency Department (HOSPITAL_COMMUNITY)
Admission: EM | Admit: 2018-04-10 | Discharge: 2018-04-10 | Disposition: A | Discharge status: Home / Self Care | Payer: Self-pay | Attending: Emergency Medicine | Admitting: Emergency Medicine

## 2018-04-10 DIAGNOSIS — Z7984 Long term (current) use of oral hypoglycemic drugs: Secondary | ICD-10-CM | POA: Insufficient documentation

## 2018-04-10 DIAGNOSIS — Z79899 Other long term (current) drug therapy: Secondary | ICD-10-CM | POA: Insufficient documentation

## 2018-04-10 DIAGNOSIS — Z87891 Personal history of nicotine dependence: Secondary | ICD-10-CM | POA: Insufficient documentation

## 2018-04-10 DIAGNOSIS — L03031 Cellulitis of right toe: Secondary | ICD-10-CM | POA: Insufficient documentation

## 2018-04-10 DIAGNOSIS — Z7982 Long term (current) use of aspirin: Secondary | ICD-10-CM | POA: Insufficient documentation

## 2018-04-10 DIAGNOSIS — I10 Essential (primary) hypertension: Secondary | ICD-10-CM | POA: Insufficient documentation

## 2018-04-10 DIAGNOSIS — E1140 Type 2 diabetes mellitus with diabetic neuropathy, unspecified: Secondary | ICD-10-CM | POA: Insufficient documentation

## 2018-04-10 DIAGNOSIS — L02611 Cutaneous abscess of right foot: Secondary | ICD-10-CM | POA: Insufficient documentation

## 2018-04-10 LAB — CBG MONITORING, ED: Glucose-Capillary: 254 mg/dL — ABNORMAL HIGH (ref 70–99)

## 2018-04-10 MED ORDER — DOXYCYCLINE HYCLATE 100 MG PO CAPS: 100.0000 mg | ORAL_CAPSULE | Freq: Two times a day (BID) | ORAL | 0 refills | Status: AC

## 2018-04-10 NOTE — ED Notes (Signed)
Patient transported to X-ray 

## 2018-04-10 NOTE — Discharge Instructions (Signed)
Please take all of your antibiotics until finished!   You may develop abdominal discomfort or diarrhea from the antibiotic.  You may help offset this with probiotics which you can buy or get in yogurt. Do not eat  or take the probiotics until 2 hours after your antibiotic.   Continue wound care at home.  Continue taking your home medicines including your insulin and checking your blood sugars.  Please follow-up with your primary care physician as scheduled or with podiatry or wound clinic.  Give the antibiotic some time to work but please return to the emergency department immediately if any concerning signs or symptoms develop such as fevers, vomiting, sweating, significantly elevated blood sugars, or if there is worsening spread of redness up the leg.
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
## 2018-04-10 NOTE — ED Triage Notes (Signed)
Pt reports right big toe infection is "getting worse", toe is wrapped in silver gel, collagen, gauze and wrap.  Denies any fevers.

## 2018-04-10 NOTE — ED Notes (Signed)
Confirmed swabs needed with Macrobiology and provided to EDP

## 2018-04-10 NOTE — ED Notes (Signed)
Patient verbalizes understanding of discharge instructions. Opportunity for questioning and answers were provided. Armband removed by staff, pt discharged from ED.  

## 2018-04-13 LAB — AEROBIC CULTURE W GRAM STAIN (SUPERFICIAL SPECIMEN): Gram Stain: NONE SEEN

## 2018-04-14 ENCOUNTER — Telehealth: Payer: Self-pay | Admitting: Emergency Medicine

## 2018-04-14 NOTE — Progress Notes (Signed)
ED Antimicrobial Stewardship Positive Culture Follow Up   [name] is an 48 y.o. female who presented to [facility] on 04/10/2018 with a chief complaint of R-toe infection.  Recent Results (from the past 720 hour(s))  Culture, blood (routine x 2)     Status: None   Collection Time: 03/20/18  4:59 PM  Result Value Ref Range Status   Specimen Description BLOOD LEFT ANTECUBITAL  Final   Special Requests   Final    BOTTLES DRAWN AEROBIC AND ANAEROBIC Blood Culture adequate volume   Culture   Final    NO GROWTH 5 DAYS Performed at [facility], [address] [place], [place] [address]    Report Status 03/25/2018 FINAL  Final  Culture, blood (routine x 2)     Status: None   Collection Time: 03/20/18  5:11 PM  Result Value Ref Range Status   Specimen Description BLOOD LEFT ANTECUBITAL  Final   Special Requests   Final    BOTTLES DRAWN AEROBIC AND ANAEROBIC Blood Culture results may not be optimal due to an inadequate volume of blood received in culture bottles   Culture   Final    NO GROWTH 5 DAYS Performed at [facility], [address], [place], [place] [address]    Report Status 03/25/2018 FINAL  Final  MRSA PCR Screening     Status: None   Collection Time: 03/21/18  8:59 AM  Result Value Ref Range Status   MRSA by PCR NEGATIVE NEGATIVE Final    Comment:        The GeneXpert MRSA Assay (FDA approved for NASAL specimens only), is one component of a comprehensive MRSA colonization surveillance program. It is not intended to diagnose MRSA infection nor to guide or monitor treatment for MRSA infections. Performed at [facility], [address], [place], [place] [address]   Aerobic Culture (superficial specimen)     Status: None   Collection Time: 04/10/18  4:59 PM  Result Value Ref Range Status   Specimen Description WOUND  Final   Special Requests SITE NOT SPECIFIED  Final   Gram Stain   Final    NO WBC SEEN NO ORGANISMS SEEN Performed at  [facility], [address] [place], [place] [address]    Culture FEW PSEUDOMONAS AERUGINOSA  Final   Report Status 04/13/2018 FINAL  Final   Organism ID, Bacteria PSEUDOMONAS AERUGINOSA  Final      Susceptibility   Pseudomonas aeruginosa - MIC*    CEFTAZIDIME 4 SENSITIVE Sensitive     CIPROFLOXACIN <=0.25 SENSITIVE Sensitive     GENTAMICIN 2 SENSITIVE Sensitive     IMIPENEM 2 SENSITIVE Sensitive     PIP/TAZO 8 SENSITIVE Sensitive     CEFEPIME 2 SENSITIVE Sensitive     * FEW PSEUDOMONAS AERUGINOSA    [x]  Treated with doxycycline, will not cover pseudomonas aeruginosa infection   New antibiotic prescription: Cipro 500mg  PO every 12 hours x 7d  ED Provider: Ebbie Ridge, PA-C  Daylene Posey 04/14/2018, 10:20 AM Clinical Pharmacist Monday - Friday phone -  [phone] Saturday - Sunday phone - [phone]

## 2018-04-14 NOTE — Telephone Encounter (Signed)
Post ED Visit - Positive Culture Follow-up: Unsuccessful Patient Follow-up  Culture assessed and recommendations reviewed by:  []  Enzo Bi, Pharm.D. []  Celedonio Miyamoto, Pharm.D., BCPS AQ-ID []  Garvin Fila, Pharm.D., BCPS []  Georgina Pillion, Pharm.D., BCPS []  [place], [place].D., BCPS, AAHIVP []  Estella Husk, Pharm.D., BCPS, AAHIVP []  Sherlynn Carbon, PharmD [x]  Daylene Posey, PharmD, BCPS  Positive aerobic culture  []  Patient discharged without antimicrobial prescription and treatment is now indicated [x]  Organism is resistant to prescribed ED discharge antimicrobial []  Patient with positive blood cultures   Unable to contact patient after 3 attempts, letter will be sent to address on file  Norm Parcel 04/14/2018, 2:49 PM

## 2018-04-18 ENCOUNTER — Telehealth: Payer: Self-pay

## 2018-04-18 NOTE — Telephone Encounter (Signed)
Post ED Visit - Positive Culture Follow-up: Successful Patient Follow-Up  Culture assessed and recommendations reviewed by:  []  Enzo Bi, Pharm.D. []  Celedonio Miyamoto, Pharm.D., BCPS AQ-ID []  Garvin Fila, Pharm.D., BCPS []  Georgina Pillion, Pharm.D., BCPS []  [place], [address].D., BCPS, AAHIVP []  Estella Husk, Pharm.D., BCPS, AAHIVP []  Lysle Pearl, PharmD, BCPS []  Phillips Climes, PharmD, BCPS []  Agapito Games, PharmD, BCPS []  Verlan Friends, PharmD J. Oriet Pharm D Positive aerobic culture  []  Patient discharged without antimicrobial prescription and treatment is now indicated [x]  Organism is resistant to prescribed ED discharge antimicrobial []  Patient with positive blood cultures  Changes discussed with ED provider: Ebbie Ridge [facility] New antibiotic prescription Cipro 500 mg PO Q 12 hours x 7 days Called to [facility] [phone]  Contacted patient, date 04/18/2018, time 1143   Jerry Caras 04/18/2018, 11:41 AM

## 2018-05-03 ENCOUNTER — Ambulatory Visit: Payer: Self-pay | Admitting: Family Medicine

## 2018-05-31 ENCOUNTER — Telehealth: Payer: Self-pay

## 2018-05-31 NOTE — Telephone Encounter (Signed)
Called to do COVID Screening for appointment tomorrow. No answer. Left a message to call back. Thanks! 

## 2018-06-03 ENCOUNTER — Other Ambulatory Visit: Payer: Self-pay

## 2018-06-03 ENCOUNTER — Ambulatory Visit (INDEPENDENT_AMBULATORY_CARE_PROVIDER_SITE_OTHER): Payer: Self-pay | Admitting: Family Medicine

## 2018-06-03 ENCOUNTER — Ambulatory Visit: Payer: Self-pay | Admitting: Family Medicine

## 2018-06-03 ENCOUNTER — Encounter: Payer: Self-pay | Admitting: Family Medicine

## 2018-06-03 VITALS — BP 127/73 | HR 68 | Temp 98.4°F | Resp 14 | Ht 64.0 in | Wt 208.0 lb

## 2018-06-03 DIAGNOSIS — E1149 Type 2 diabetes mellitus with other diabetic neurological complication: Secondary | ICD-10-CM

## 2018-06-03 DIAGNOSIS — E119 Type 2 diabetes mellitus without complications: Secondary | ICD-10-CM

## 2018-06-03 DIAGNOSIS — IMO0001 Reserved for inherently not codable concepts without codable children: Secondary | ICD-10-CM

## 2018-06-03 DIAGNOSIS — Z794 Long term (current) use of insulin: Secondary | ICD-10-CM

## 2018-06-03 DIAGNOSIS — Z23 Encounter for immunization: Secondary | ICD-10-CM

## 2018-06-03 DIAGNOSIS — K219 Gastro-esophageal reflux disease without esophagitis: Secondary | ICD-10-CM

## 2018-06-03 LAB — GLUCOSE, POCT (MANUAL RESULT ENTRY): POC Glucose: 232 mg/dl — AB (ref 70–99)

## 2018-06-03 LAB — POCT URINALYSIS DIPSTICK
Bilirubin, UA: NEGATIVE
Blood, UA: NEGATIVE
Glucose, UA: POSITIVE — AB
Ketones, UA: NEGATIVE
Leukocytes, UA: NEGATIVE
Nitrite, UA: NEGATIVE
Protein, UA: NEGATIVE
Spec Grav, UA: 1.02 (ref 1.010–1.025)
Urobilinogen, UA: 0.2 E.U./dL
pH, UA: 5.5 (ref 5.0–8.0)

## 2018-06-03 LAB — POCT GLYCOSYLATED HEMOGLOBIN (HGB A1C): Hemoglobin A1C: 10.1 % — AB (ref 4.0–5.6)

## 2018-06-03 LAB — POCT CBG (FASTING - GLUCOSE)-MANUAL ENTRY: Glucose Fasting, POC: 261 mg/dL — AB (ref 70–99)

## 2018-06-03 MED ORDER — ASPIRIN EC 81 MG PO TBEC: 81.0000 mg | DELAYED_RELEASE_TABLET | Freq: Two times a day (BID) | ORAL | 2 refills | Status: AC

## 2018-06-03 MED ORDER — GABAPENTIN 800 MG PO TABS: 1600.0000 mg | ORAL_TABLET | ORAL | 1 refills | Status: DC

## 2018-06-03 MED ORDER — GLUCOSE BLOOD VI STRP: 1.0000 | ORAL_STRIP | Freq: Two times a day (BID) | 0 refills | Status: DC

## 2018-06-03 MED ORDER — METFORMIN HCL 1000 MG PO TABS: 1000.0000 mg | ORAL_TABLET | Freq: Two times a day (BID) | ORAL | 1 refills | Status: DC

## 2018-06-03 MED ORDER — FAMOTIDINE 20 MG PO TABS: 20.0000 mg | ORAL_TABLET | Freq: Two times a day (BID) | ORAL | 0 refills | Status: DC

## 2018-06-03 MED ORDER — INSULIN ASPART PROT & ASPART (70-30 MIX) 100 UNIT/ML ~~LOC~~ SUSP: 35.0000 [IU] | Freq: Two times a day (BID) | SUBCUTANEOUS | 5 refills | Status: DC

## 2018-06-03 MED ORDER — ATORVASTATIN CALCIUM 40 MG PO TABS: 40.0000 mg | ORAL_TABLET | Freq: Every evening | ORAL | 2 refills | Status: AC

## 2018-06-03 MED ORDER — INSULIN LISPRO 100 UNIT/ML ~~LOC~~ SOLN
10.0000 [IU] | Freq: Once | SUBCUTANEOUS | Status: AC
  Administered 2018-06-03: 10 [IU] via SUBCUTANEOUS

## 2018-06-03 NOTE — Patient Instructions (Signed)
Diabetes Mellitus and Standards of Medical Care Managing diabetes (diabetes mellitus) can be complicated. Your diabetes treatment may be managed by a team of health care providers, including:  A physician who specializes in diabetes (endocrinologist).  A nurse practitioner or physician assistant.  Nurses.  A diet and nutrition specialist (registered dietitian).  A certified diabetes educator (CDE).  An exercise specialist.  A pharmacist.  An eye doctor.  A foot specialist (podiatrist).  A dentist.  A primary care provider.  A mental health provider. Your health care providers follow guidelines to help you get the best quality of care. The following schedule is a general guideline for your diabetes management plan. Your health care providers may give you more specific instructions. Physical exams Upon being diagnosed with diabetes mellitus, and each year after that, your health care provider will ask about your medical and family history. He or she will also do a physical exam. Your exam may include:  Measuring your height, weight, and body mass index (BMI).  Checking your blood pressure. This will be done at every routine medical visit. Your target blood pressure may vary depending on your medical conditions, your age, and other factors.  Thyroid gland exam.  Skin exam.  Screening for damage to your nerves (peripheral neuropathy). This may include checking the pulse in your legs and feet and checking the level of sensation in your hands and feet.  A complete foot exam to inspect the structure and skin of your feet, including checking for cuts, bruises, redness, blisters, sores, or other problems.  Screening for blood vessel (vascular) problems, which may include checking the pulse in your legs and feet and checking your temperature. Blood tests Depending on your treatment plan and your personal needs, you may have the following tests done:  HbA1c (hemoglobin A1c). This  test provides information about blood sugar (glucose) control over the previous 2-3 months. It is used to adjust your treatment plan, if needed. This test will be done: ? At least 2 times a year, if you are meeting your treatment goals. ? 4 times a year, if you are not meeting your treatment goals or if treatment goals have changed.  Lipid testing, including total, LDL, and HDL cholesterol and triglyceride levels. ? The goal for LDL is less than 100 mg/dL (5.5 mmol/L). If you are at high risk for complications, the goal is less than 70 mg/dL (3.9 mmol/L). ? The goal for HDL is 40 mg/dL (2.2 mmol/L) or higher for men and 50 mg/dL (2.8 mmol/L) or higher for women. An HDL cholesterol of 60 mg/dL (3.3 mmol/L) or higher gives some protection against heart disease. ? The goal for triglycerides is less than 150 mg/dL (8.3 mmol/L).  Liver function tests.  Kidney function tests.  Thyroid function tests. Dental and eye exams  Visit your dentist two times a year.  If you have type 1 diabetes, your health care provider may recommend an eye exam 3-5 years after you are diagnosed, and then once a year after your first exam. ? For children with type 1 diabetes, a health care provider may recommend an eye exam when your child is age 60 or older and has had diabetes for 3-5 years. After the first exam, your child should get an eye exam once a year.  If you have type 2 diabetes, your health care provider may recommend an eye exam as soon as you are diagnosed, and then once a year after your first exam. Immunizations   The  yearly flu (influenza) vaccine is recommended for everyone 6 months or older who has diabetes.  The pneumonia (pneumococcal) vaccine is recommended for everyone 2 years or older who has diabetes. If you are 55 or older, you may get the pneumonia vaccine as a series of two separate shots.  The hepatitis B vaccine is recommended for adults shortly after being diagnosed with  diabetes.  Adults and children with diabetes should receive all other vaccines according to age-specific recommendations from the Centers for Disease Control and Prevention (CDC). Mental and emotional health Screening for symptoms of eating disorders, anxiety, and depression is recommended at the time of diagnosis and afterward as needed. If your screening shows that you have symptoms (positive screening result), you may need more evaluation and you may work with a mental health care provider. Treatment plan Your treatment plan will be reviewed at every medical visit. You and your health care provider will discuss:  How you are taking your medicines, including insulin.  Any side effects you are experiencing.  Your blood glucose target goals.  The frequency of your blood glucose monitoring.  Lifestyle habits, such as activity level as well as tobacco, alcohol, and substance use. Diabetes self-management education Your health care provider will assess how well you are monitoring your blood glucose levels and whether you are taking your insulin correctly. He or she may refer you to:  A certified diabetes educator to manage your diabetes throughout your life, starting at diagnosis.  A registered dietitian who can create or review your personal nutrition plan.  An exercise specialist who can discuss your activity level and exercise plan. Summary  Managing diabetes (diabetes mellitus) can be complicated. Your diabetes treatment may be managed by a team of health care providers.  Your health care providers follow guidelines in order to help you get the best quality of care.  Standards of care including having regular physical exams, blood tests, blood pressure monitoring, immunizations, screening tests, and education about how to manage your diabetes.  Your health care providers may also give you more specific instructions based on your individual health. This information is not intended  to replace advice given to you by your health care provider. Make sure you discuss any questions you have with your health care provider. Document Released: 10/16/2008 Document Revised: 09/07/2017 Document Reviewed: 09/17/2015 Elsevier Interactive Patient Education  2019 [employer] Diabetes Mellitus and [address] care is an important part of your health, especially when you have diabetes. Diabetes may cause you to have problems because of poor blood flow (circulation) to your feet and legs, which can cause your skin to:  Become thinner and drier.  Break more easily.  Heal more slowly.  Peel and crack. You may also have nerve damage (neuropathy) in your legs and feet, causing decreased feeling in them. This means that you may not notice minor injuries to your feet that could lead to more serious problems. Noticing and addressing any potential problems early is the best way to prevent future foot problems. How to care for your feet Foot hygiene  Wash your feet daily with warm water and mild soap. Do not use hot water. Then, pat your feet and the areas between your toes until they are completely dry. Do not soak your feet as this can dry your skin.  Trim your toenails straight across. Do not dig under them or around the cuticle. File the edges of your nails with an emery board or nail file.  Apply  a moisturizing lotion or petroleum jelly to the skin on your feet and to dry, brittle toenails. Use lotion that does not contain alcohol and is unscented. Do not apply lotion between your toes. Shoes and socks  Wear clean socks or stockings every day. Make sure they are not too tight. Do not wear knee-high stockings since they may decrease blood flow to your legs.  Wear shoes that fit properly and have enough cushioning. Always look in your shoes before you put them on to be sure there are no objects inside.  To break in new shoes, wear them for just a few hours a day. This prevents  injuries on your feet. Wounds, scrapes, corns, and calluses  Check your feet daily for blisters, cuts, bruises, sores, and redness. If you cannot see the bottom of your feet, use a mirror or ask someone for help.  Do not cut corns or calluses or try to remove them with medicine.  If you find a minor scrape, cut, or break in the skin on your feet, keep it and the skin around it clean and dry. You may clean these areas with mild soap and water. Do not clean the area with peroxide, alcohol, or iodine.  If you have a wound, scrape, corn, or callus on your foot, look at it several times a day to make sure it is healing and not infected. Check for: ? Redness, swelling, or pain. ? Fluid or blood. ? Warmth. ? Pus or a bad smell. General instructions  Do not cross your legs. This may decrease blood flow to your feet.  Do not use heating pads or hot water bottles on your feet. They may burn your skin. If you have lost feeling in your feet or legs, you may not know this is happening until it is too late.  Protect your feet from hot and cold by wearing shoes, such as at the beach or on hot pavement.  Schedule a complete foot exam at least once a year (annually) or more often if you have foot problems. If you have foot problems, report any cuts, sores, or bruises to your health care provider immediately. Contact a health care provider if:  You have a medical condition that increases your risk of infection and you have any cuts, sores, or bruises on your feet.  You have an injury that is not healing.  You have redness on your legs or feet.  You feel burning or tingling in your legs or feet.  You have pain or cramps in your legs and feet.  Your legs or feet are numb.  Your feet always feel cold.  You have pain around a toenail. Get help right away if:  You have a wound, scrape, corn, or callus on your foot and: ? You have pain, swelling, or redness that gets worse. ? You have fluid or  blood coming from the wound, scrape, corn, or callus. ? Your wound, scrape, corn, or callus feels warm to the touch. ? You have pus or a bad smell coming from the wound, scrape, corn, or callus. ? You have a fever. ? You have a red line going up your leg. Summary  Check your feet every day for cuts, sores, red spots, swelling, and blisters.  Moisturize feet and legs daily.  Wear shoes that fit properly and have enough cushioning.  If you have foot problems, report any cuts, sores, or bruises to your health care provider immediately.  Schedule a complete foot  exam at least once a year (annually) or more often if you have foot problems. This information is not intended to replace advice given to you by your health care provider. Make sure you discuss any questions you have with your health care provider. Document Released: 12/17/1999 Document Revised: 01/31/2017 Document Reviewed: 01/21/2016 Elsevier Interactive Patient Education  2019 [place] Maintenance, Female Adopting a healthy lifestyle and getting preventive care can go a long way to promote health and wellness. Talk with your health care provider about what schedule of regular examinations is right for you. This is a good chance for you to check in with your provider about disease prevention and staying healthy. In between checkups, there are plenty of things you can do on your own. Experts have done a lot of research about which lifestyle changes and preventive measures are most likely to keep you healthy. Ask your health care provider for more information. Weight and diet Eat a healthy diet  Be sure to include plenty of vegetables, fruits, low-fat dairy products, and lean protein.  Do not eat a lot of foods high in solid fats, added sugars, or salt.  Get regular exercise. This is one of the most important things you can do for your health. ? Most adults should exercise for at least 150 minutes each week. The  exercise should increase your heart rate and make you sweat (moderate-intensity exercise). ? Most adults should also do strengthening exercises at least twice a week. This is in addition to the moderate-intensity exercise. Maintain a healthy weight  Body mass index (BMI) is a measurement that can be used to identify possible weight problems. It estimates body fat based on height and weight. Your health care provider can help determine your BMI and help you achieve or maintain a healthy weight.  For females 9 years of age and older: ? A BMI below 18.5 is considered underweight. ? A BMI of 18.5 to 24.9 is normal. ? A BMI of 25 to 29.9 is considered overweight. ? A BMI of 30 and above is considered obese. Watch levels of cholesterol and blood lipids  You should start having your blood tested for lipids and cholesterol at 46 years of age, then have this test every 5 years.  You may need to have your cholesterol levels checked more often if: ? Your lipid or cholesterol levels are high. ? You are older than 46 years of age. ? You are at high risk for heart disease. Cancer screening Lung Cancer  Lung cancer screening is recommended for adults 29-41 years old who are at high risk for lung cancer because of a history of smoking.  A yearly low-dose CT scan of the lungs is recommended for people who: ? Currently smoke. ? Have quit within the past 15 years. ? Have at least a 30-pack-year history of smoking. A pack year is smoking an average of one pack of cigarettes a day for 1 year.  Yearly screening should continue until it has been 15 years since you quit.  Yearly screening should stop if you develop a health problem that would prevent you from having lung cancer treatment. Breast Cancer  Practice breast self-awareness. This means understanding how your breasts normally appear and feel.  It also means doing regular breast self-exams. Let your health care provider know about any changes,  no matter how small.  If you are in your 20s or 30s, you should have a clinical breast exam (CBE) by a health care  provider every 1-3 years as part of a regular health exam.  If you are 53 or older, have a CBE every year. Also consider having a breast X-ray (mammogram) every year.  If you have a family history of breast cancer, talk to your health care provider about genetic screening.  If you are at high risk for breast cancer, talk to your health care provider about having an MRI and a mammogram every year.  Breast cancer gene (BRCA) assessment is recommended for women who have family members with BRCA-related cancers. BRCA-related cancers include: ? Breast. ? Ovarian. ? Tubal. ? Peritoneal cancers.  Results of the assessment will determine the need for genetic counseling and BRCA1 and BRCA2 testing. Cervical Cancer Your health care provider may recommend that you be screened regularly for cancer of the pelvic organs (ovaries, uterus, and vagina). This screening involves a pelvic examination, including checking for microscopic changes to the surface of your cervix (Pap test). You may be encouraged to have this screening done every 3 years, beginning at age 65.  For women ages 36-65, health care providers may recommend pelvic exams and Pap testing every 3 years, or they may recommend the Pap and pelvic exam, combined with testing for human papilloma virus (HPV), every 5 years. Some types of HPV increase your risk of cervical cancer. Testing for HPV may also be done on women of any age with unclear Pap test results.  Other health care providers may not recommend any screening for nonpregnant women who are considered low risk for pelvic cancer and who do not have symptoms. Ask your health care provider if a screening pelvic exam is right for you.  If you have had past treatment for cervical cancer or a condition that could lead to cancer, you need Pap tests and screening for cancer for at  least 20 years after your treatment. If Pap tests have been discontinued, your risk factors (such as having a new sexual partner) need to be reassessed to determine if screening should resume. Some women have medical problems that increase the chance of getting cervical cancer. In these cases, your health care provider may recommend more frequent screening and Pap tests. Colorectal Cancer  This type of cancer can be detected and often prevented.  Routine colorectal cancer screening usually begins at 46 years of age and continues through 46 years of age.  Your health care provider may recommend screening at an earlier age if you have risk factors for colon cancer.  Your health care provider may also recommend using home test kits to check for hidden blood in the stool.  A small camera at the end of a tube can be used to examine your colon directly (sigmoidoscopy or colonoscopy). This is done to check for the earliest forms of colorectal cancer.  Routine screening usually begins at age 59.  Direct examination of the colon should be repeated every 5-10 years through 46 years of age. However, you may need to be screened more often if early forms of precancerous polyps or small growths are found. Skin Cancer  Check your skin from head to toe regularly.  Tell your health care provider about any new moles or changes in moles, especially if there is a change in a mole's shape or color.  Also tell your health care provider if you have a mole that is larger than the size of a pencil eraser.  Always use sunscreen. Apply sunscreen liberally and repeatedly throughout the day.  Protect yourself by  wearing long sleeves, pants, a wide-brimmed hat, and sunglasses whenever you are outside. Heart disease, diabetes, and high blood pressure  High blood pressure causes heart disease and increases the risk of stroke. High blood pressure is more likely to develop in: ? People who have blood pressure in the  high end of the normal range (130-139/85-89 mm Hg). ? People who are overweight or obese. ? People who are African American.  If you are 11-31 years of age, have your blood pressure checked every 3-5 years. If you are 71 years of age or older, have your blood pressure checked every year. You should have your blood pressure measured twice--once when you are at a hospital or clinic, and once when you are not at a hospital or clinic. Record the average of the two measurements. To check your blood pressure when you are not at a hospital or clinic, you can use: ? An automated blood pressure machine at a pharmacy. ? A home blood pressure monitor.  If you are between 63 years and 57 years old, ask your health care provider if you should take aspirin to prevent strokes.  Have regular diabetes screenings. This involves taking a blood sample to check your fasting blood sugar level. ? If you are at a normal weight and have a low risk for diabetes, have this test once every three years after 46 years of age. ? If you are overweight and have a high risk for diabetes, consider being tested at a younger age or more often. Preventing infection Hepatitis B  If you have a higher risk for hepatitis B, you should be screened for this virus. You are considered at high risk for hepatitis B if: ? You were born in a country where hepatitis B is common. Ask your health care provider which countries are considered high risk. ? Your parents were born in a high-risk country, and you have not been immunized against hepatitis B (hepatitis B vaccine). ? You have HIV or AIDS. ? You use needles to inject street drugs. ? You live with someone who has hepatitis B. ? You have had sex with someone who has hepatitis B. ? You get hemodialysis treatment. ? You take certain medicines for conditions, including cancer, organ transplantation, and autoimmune conditions. Hepatitis C  Blood testing is recommended for: ? Everyone born  from [birth year] through 1963. ? Anyone with known risk factors for hepatitis C. Sexually transmitted infections (STIs)  You should be screened for sexually transmitted infections (STIs) including gonorrhea and chlamydia if: ? You are sexually active and are younger than 46 years of age. ? You are older than 46 years of age and your health care provider tells you that you are at risk for this type of infection. ? Your sexual activity has changed since you were last screened and you are at an increased risk for chlamydia or gonorrhea. Ask your health care provider if you are at risk.  If you do not have HIV, but are at risk, it may be recommended that you take a prescription medicine daily to prevent HIV infection. This is called pre-exposure prophylaxis (PrEP). You are considered at risk if: ? You are sexually active and do not regularly use condoms or know the HIV status of your partner(s). ? You take drugs by injection. ? You are sexually active with a partner who has HIV. Talk with your health care provider about whether you are at high risk of being infected with HIV. If you choose  to begin PrEP, you should first be tested for HIV. You should then be tested every 3 months for as long as you are taking PrEP. Pregnancy  If you are premenopausal and you may become pregnant, ask your health care provider about preconception counseling.  If you may become pregnant, take 400 to 800 micrograms (mcg) of folic acid every day.  If you want to prevent pregnancy, talk to your health care provider about birth control (contraception). Osteoporosis and menopause  Osteoporosis is a disease in which the bones lose minerals and strength with aging. This can result in serious bone fractures. Your risk for osteoporosis can be identified using a bone density scan.  If you are 48 years of age or older, or if you are at risk for osteoporosis and fractures, ask your health care provider if you should be  screened.  Ask your health care provider whether you should take a calcium or vitamin D supplement to lower your risk for osteoporosis.  Menopause may have certain physical symptoms and risks.  Hormone replacement therapy may reduce some of these symptoms and risks. Talk to your health care provider about whether hormone replacement therapy is right for you. Follow these instructions at home:  Schedule regular health, dental, and eye exams.  Stay current with your immunizations.  Do not use any tobacco products including cigarettes, chewing tobacco, or electronic cigarettes.  If you are pregnant, do not drink alcohol.  If you are breastfeeding, limit how much and how often you drink alcohol.  Limit alcohol intake to no more than 1 drink per day for nonpregnant women. One drink equals 12 ounces of beer, 5 ounces of wine, or 1 ounces of hard liquor.  Do not use street drugs.  Do not share needles.  Ask your health care provider for help if you need support or information about quitting drugs.  Tell your health care provider if you often feel depressed.  Tell your health care provider if you have ever been abused or do not feel safe at home. This information is not intended to replace advice given to you by your health care provider. Make sure you discuss any questions you have with your health care provider. Document Released: 07/04/2010 Document Revised: 05/27/2015 Document Reviewed: 09/22/2014 Elsevier Interactive Patient Education  2019 [employer].

## 2018-06-03 NOTE — Progress Notes (Signed)
[facility]  New Patient Encounter Provider: Mike Gip, FNP    QMV:[id number]  MWU:[id number]  DOB - 1970-09-12  SUBJECTIVE:   [name], is a 48 y.o. female who presents to establish care with this clinic.   Current problems/concerns: Patient states that she has a history of diabetic retinopathy and is unable to see out of her left eye. She reports that her vision is 20% in the right. She is followed by Dr. Lavone Neri for this. Was being seen by [facility] for her diabetes and diabetic foot ulcers. She reports that the wounds have completely closed.  Has not been seen by any specialists since September 2019. She reports that the has been "dwindling down" her medications and is only using what she can afford. Currently taking 35 units of Novolog 70.30 mix 2 times per day. She reports compliance with metformin and lipitor. She is on a high dose of gabapentin and has been for several years.  Patient was seen at the [facility] for Schizophrenia, Bipolar Disorder and Anxiety. Patient states that she was on 6 medications for her mental illness. She states that she has not had any medications since November and cannot remember what she was taking. Patient endorses hearing voices intermittently and having racing thoughts. No difficulty with or excessive sleep. She denies mania, suicidal or homicidal ideations, intent or plan  at the present time. She does not want to be on medications at the present time.  Allergies  Allergen Reactions   Tramadol Other (See Comments)    Unknown; pt claims she doesn't recall having this allergy    Past Medical History:  Diagnosis Date   Anxiety    Depression    Diabetes mellitus without complication (HCC)    High cholesterol    Hypertension    Neuropathy    PCOS (polycystic ovarian syndrome)    Current Outpatient Medications on File Prior to Visit   Medication Sig Dispense Refill   Multiple Vitamins-Minerals (MULTIVITAMIN GUMMIES ADULT PO) Take 1 capsule by mouth daily.     No current facility-administered medications on file prior to visit.    Family History  Problem Relation Age of Onset   Depression Mother    Anxiety disorder Mother    Diabetes Mother    Diabetes Father    Diabetes Brother    Social History   Socioeconomic History   Marital status: Married    Spouse name: Not on file   Number of children: Not on file   Years of education: Not on file   Highest education level: Not on file  Occupational History   Not on file  Social Needs   Financial resource strain: Not on file   Food insecurity:    Worry: Not on file    Inability: Not on file   Transportation needs:    Medical: Not on file    Non-medical: Not on file  Tobacco Use   Smoking status: Former Smoker    Types: Cigarettes    Last attempt to quit: 09/12/2000    Years since quitting: 17.7   Smokeless tobacco: Never Used  Substance and Sexual Activity   Alcohol use: Not Currently    Comment: occasional   Drug use: Yes    Types: Marijuana    Comment: daily    Sexual activity: Yes    Birth control/protection: None, Condom  Lifestyle   Physical activity:  Days per week: Not on file    Minutes per session: Not on file   Stress: Not on file  Relationships   Social connections:    Talks on phone: Not on file    Gets together: Not on file    Attends religious service: Not on file    Active member of club or organization: Not on file    Attends meetings of clubs or organizations: Not on file    Relationship status: Not on file   Intimate partner violence:    Fear of current or ex partner: Not on file    Emotionally abused: Not on file    Physically abused: Not on file    Forced sexual activity: Not on file  Other Topics Concern   Not on file  Social History Narrative   Not on file    Review of Systems    Constitutional: Negative.   HENT: Negative.   Eyes: Negative.        Retinopathy  Respiratory: Negative.   Cardiovascular: Negative.   Gastrointestinal: Negative.   Genitourinary: Negative.   Musculoskeletal: Positive for myalgias.  Skin: Negative.   Neurological: Negative.   Psychiatric/Behavioral: Negative.      OBJECTIVE:    BP 127/73 (BP Location: Right Arm, Patient Position: Sitting, Cuff Size: Normal)    Pulse 68    Temp 98.4 F (36.9 C) (Oral)    Resp 14    Ht 5\' 4"  (1.626 m)    Wt 208 lb (94.3 kg)    LMP 05/18/2018    SpO2 100%    BMI 35.70 kg/m   Physical Exam  Constitutional: She is oriented to person, place, and time and well-developed, well-nourished, and in no distress. No distress.  HENT:  Head: Normocephalic and atraumatic.  Eyes: Pupils are equal, round, and reactive to light. Conjunctivae and EOM are normal.  Neck: Normal range of motion. Neck supple.  Cardiovascular: Normal rate, regular rhythm and intact distal pulses. Exam reveals no gallop and no friction rub.  No murmur heard. Pulmonary/Chest: Effort normal and breath sounds normal. No respiratory distress. She has no wheezes.  Abdominal: Soft. Bowel sounds are normal. There is no abdominal tenderness.  Musculoskeletal: Normal range of motion.        General: No tenderness or edema.  Lymphadenopathy:    She has no cervical adenopathy.  Neurological: She is alert and oriented to person, place, and time. Gait normal. Coordination (ambulates with white cane) abnormal.  Skin: Skin is warm and dry.  Psychiatric: Mood, memory, affect and judgment normal.  Nursing note and vitals reviewed.    ASSESSMENT/PLAN:  1. Insulin dependent diabetes mellitus (HCC) - Urinalysis Dipstick - HgB A1c - Glucose (CBG), Fasting - Microalbumin/Creatinine Ratio, Urine - aspirin EC 81 MG tablet; Take 1 tablet (81 mg total) by mouth 2 (two) times daily.  Dispense: 60 tablet; Refill: 2 - atorvastatin (LIPITOR) 40 MG tablet;  Take 1 tablet (40 mg total) by mouth every evening.  Dispense: 90 tablet; Refill: 2 - insulin aspart protamine- aspart (NOVOLOG MIX 70/30) (70-30) 100 UNIT/ML injection; Inject 0.35 mLs (35 Units total) into the skin 2 (two) times daily with a meal.  Dispense: 30 mL; Refill: 5 - glucose blood (ONE TOUCH ULTRA TEST) test strip; 1 each by Other route 2 (two) times daily. And lancets 2/day.  Dispense: 100 each; Refill: 0 - insulin lispro (HUMALOG) injection 10 Units - Lipid Panel - Comprehensive metabolic panel - TSH - CBC with Differential  2. Gastroesophageal reflux disease, esophagitis presence not specified - famotidine (PEPCID) 20 MG tablet; Take 1 tablet (20 mg total) by mouth 2 (two) times daily.  Dispense: 30 tablet; Refill: 0  3. Other diabetic neurological complication associated with type 2 diabetes mellitus (HCC) - gabapentin (NEURONTIN) 800 MG tablet; Take 2-4 tablets (1,600-3,200 mg total) by mouth See admin instructions. Take 1600 mg by mouth morning  take 2400 mg by mouth evening.  Dispense: 150 tablet; Refill: 1 - metFORMIN (GLUCOPHAGE) 1000 MG tablet; Take 1 tablet (1,000 mg total) by mouth 2 (two) times daily with a meal.  Dispense: 180 tablet; Refill: 1    Return in about 3 months (around 09/03/2018) for DM2- needs a1c.  The patient was given clear instructions to go to ER or return to medical center if symptoms don't improve, worsen or new problems develop. The patient verbalized understanding. The patient was told to call to get lab results if they haven't heard anything in the next week.     This note has been created with Education officer, environmentalDragon speech recognition software and smart phrase technology. Any transcriptional errors are unintentional.   Ms. Andr L. Riley Lamouglas, FNP-BC [facility] [address]  [place], [place] [address] [phone]

## 2018-06-04 LAB — LIPID PANEL
Chol/HDL Ratio: 2.8 ratio (ref 0.0–4.4)
Cholesterol, Total: 160 mg/dL (ref 100–199)
HDL: 58 mg/dL (ref >39)
LDL Calculated: 80 mg/dL (ref 0–99)
Triglycerides: 109 mg/dL (ref 0–149)
VLDL Cholesterol Cal: 22 mg/dL (ref 5–40)

## 2018-06-04 LAB — COMPREHENSIVE METABOLIC PANEL
ALT: 11 IU/L (ref 0–32)
AST: 10 IU/L (ref 0–40)
Albumin/Globulin Ratio: 1.3 (ref 1.2–2.2)
Albumin: 3.6 g/dL — ABNORMAL LOW (ref 3.8–4.8)
Alkaline Phosphatase: 55 IU/L (ref 39–117)
BUN/Creatinine Ratio: 21 (ref 9–23)
BUN: 14 mg/dL (ref 6–24)
Bilirubin Total: 0.2 mg/dL (ref 0.0–1.2)
CO2: 22 mmol/L (ref 20–29)
Calcium: 8.9 mg/dL (ref 8.7–10.2)
Chloride: 102 mmol/L (ref 96–106)
Creatinine, Ser: 0.68 mg/dL (ref 0.57–1.00)
GFR calc Af Amer: 122 mL/min/{1.73_m2} (ref >59)
GFR calc non Af Amer: 106 mL/min/{1.73_m2} (ref >59)
Globulin, Total: 2.7 g/dL (ref 1.5–4.5)
Glucose: 239 mg/dL — ABNORMAL HIGH (ref 65–99)
Potassium: 4.5 mmol/L (ref 3.5–5.2)
Sodium: 137 mmol/L (ref 134–144)
Total Protein: 6.3 g/dL (ref 6.0–8.5)

## 2018-06-04 LAB — TSH: TSH: 2.58 u[IU]/mL (ref 0.450–4.500)

## 2018-06-04 LAB — CBC WITH DIFFERENTIAL/PLATELET
Basophils Absolute: 0.1 10*3/uL (ref 0.0–0.2)
Basos: 1 %
EOS (ABSOLUTE): 0.8 10*3/uL — ABNORMAL HIGH (ref 0.0–0.4)
Eos: 8 %
Hematocrit: 35.3 % (ref 34.0–46.6)
Hemoglobin: 11.8 g/dL (ref 11.1–15.9)
Immature Grans (Abs): 0 10*3/uL (ref 0.0–0.1)
Immature Granulocytes: 0 %
Lymphocytes Absolute: 2.6 10*3/uL (ref 0.7–3.1)
Lymphs: 26 %
MCH: 28.4 pg (ref 26.6–33.0)
MCHC: 33.4 g/dL (ref 31.5–35.7)
MCV: 85 fL (ref 79–97)
Monocytes Absolute: 0.7 10*3/uL (ref 0.1–0.9)
Monocytes: 7 %
Neutrophils Absolute: 5.7 10*3/uL (ref 1.4–7.0)
Neutrophils: 58 %
Platelets: 307 10*3/uL (ref 150–450)
RBC: 4.16 x10E6/uL (ref 3.77–5.28)
RDW: 12.8 % (ref 11.7–15.4)
WBC: 10 10*3/uL (ref 3.4–10.8)

## 2018-06-04 LAB — MICROALBUMIN / CREATININE URINE RATIO
Creatinine, Urine: 71.9 mg/dL
Microalb/Creat Ratio: <4 mg/g creat (ref 0–29)
Microalbumin, Urine: <3 ug/mL

## 2018-06-06 NOTE — Progress Notes (Signed)
The  labs are stable without significant clinical change.  All other results are normal or within acceptable limits. No Medication changes  Please send lab letter to patient.  

## 2018-06-21 ENCOUNTER — Telehealth: Payer: Self-pay | Admitting: Family Medicine

## 2018-06-21 NOTE — Telephone Encounter (Signed)
Un able to LVM to Pt trying to contact her about her CAFA and OC application since was missing information, no VM set up

## 2018-07-11 ENCOUNTER — Ambulatory Visit (HOSPITAL_COMMUNITY)
Admission: RE | Admit: 2018-07-11 | Discharge: 2018-07-11 | Disposition: A | Discharge status: Home / Self Care | Payer: Self-pay | Attending: Psychiatry | Admitting: Psychiatry

## 2018-07-11 ENCOUNTER — Ambulatory Visit (HOSPITAL_COMMUNITY): Admit: 2018-07-11 | Payer: Self-pay

## 2018-07-11 DIAGNOSIS — Z87891 Personal history of nicotine dependence: Secondary | ICD-10-CM | POA: Insufficient documentation

## 2018-07-11 DIAGNOSIS — F315 Bipolar disorder, current episode depressed, severe, with psychotic features: Secondary | ICD-10-CM | POA: Insufficient documentation

## 2018-07-11 DIAGNOSIS — Z818 Family history of other mental and behavioral disorders: Secondary | ICD-10-CM | POA: Insufficient documentation

## 2018-07-11 DIAGNOSIS — Z833 Family history of diabetes mellitus: Secondary | ICD-10-CM | POA: Insufficient documentation

## 2018-07-11 DIAGNOSIS — Z5329 Procedure and treatment not carried out because of patient's decision for other reasons: Secondary | ICD-10-CM | POA: Insufficient documentation

## 2018-07-11 DIAGNOSIS — F419 Anxiety disorder, unspecified: Secondary | ICD-10-CM | POA: Insufficient documentation

## 2018-07-11 DIAGNOSIS — Z794 Long term (current) use of insulin: Secondary | ICD-10-CM | POA: Insufficient documentation

## 2018-07-11 DIAGNOSIS — Z79899 Other long term (current) drug therapy: Secondary | ICD-10-CM | POA: Insufficient documentation

## 2018-07-11 DIAGNOSIS — I10 Essential (primary) hypertension: Secondary | ICD-10-CM | POA: Insufficient documentation

## 2018-07-11 DIAGNOSIS — E1140 Type 2 diabetes mellitus with diabetic neuropathy, unspecified: Secondary | ICD-10-CM | POA: Insufficient documentation

## 2018-07-11 DIAGNOSIS — E7800 Pure hypercholesterolemia, unspecified: Secondary | ICD-10-CM | POA: Insufficient documentation

## 2018-07-11 NOTE — BH Assessment (Signed)
Assessment Note  [name] is an 48 y.o. female.  -Patient came to [facility] accompanied by her husband.  He is present during assessment.  Husband drove her to [facility] after [facility] asked them to come.  Patient found out today that husband had been cheating on her.  Patient called the girlfriend and they talked.  Patient's husband was upset about this being found out.  Patient had called a Suicide hotline.  She did not feel that was helpful so she contacted [facility].    Patient said that earlier in the evening she had thoughts of taking an overdose of amyltriptiline.  She has access to these pills but says that she would not take them and had put them away so she would not be tempted.  Patient is currently denying any plan or intention to kill herself.  Patient denies any HI or visual hallucinations.  She admits to hearing muffled voices at times.  She says she recognizes this for what it is and ignores it.  Patient denies any current use of ETOH or illicit drugs.  Patient says she is upset with husband but still wants him in her life.  He is 24 years her junior.  Patient says that she feels better after having talked to him.  They met on the adult unit in 2015.  Patient is holding hands w/ husband and smiling and making jokes.  She feels safe going home.  Husband is fine with her coming home.  Patient has no current outpatient services since November '19.  She was at [facility] in 02/2013.  She has had other inpatient hospitalizations in the past.  -Clinician discussed patient care with [facility] who recommends observation unit admission.  Clinician and Corene Cornea talked with patient and she declined observation admission.  Patient said she just wanted to go home.  Patient did sign an AMA form and an "no harm" contract.  Patient has been given outpatient resouces.   Diagnosis: F31.5 Bipolar 1 d/o most recent episode depressed, w/ psychotic  features  Past Medical History:  Past Medical History:  Diagnosis Date  . Anxiety   . Depression   . Diabetes mellitus without complication ([place])   . High cholesterol   . Hypertension   . Neuropathy   . PCOS (polycystic ovarian syndrome)     Past Surgical History:  Procedure Laterality Date  . ELBOW SURGERY    . INSERTION OF AHMED VALVE Right 09/07/2015   Procedure: INSERTION OF AHMED VALVE;  Surgeon: Hurman Horn, MD;  Location: [facility];  Service: Ophthalmology;  Laterality: Right;  . LASER PHOTO ABLATION Right 09/07/2015   Procedure: LASER PHOTO ABLATION;  Surgeon: Hurman Horn, MD;  Location: [facility];  Service: Ophthalmology;  Laterality: Right;  . PARS PLANA VITRECTOMY Right 09/07/2015   Procedure: PARS PLANA VITRECTOMY WITH 25 GAUGE,, with panretinal endolaser,;  Surgeon: Hurman Horn, MD;  Location: [facility];  Service: Ophthalmology;  Laterality: Right;    Family History:  Family History  Problem Relation Age of Onset  . Depression Mother   . Anxiety disorder Mother   . Diabetes Mother   . Diabetes Father   . Diabetes Brother     Social History:  reports that she quit smoking about 17 years ago. Her smoking use included cigarettes. She has never used smokeless tobacco. She reports previous alcohol use. She reports current drug use. Drug: Marijuana.  Additional Social History:  Alcohol / Drug  Use Pain Medications: None Prescriptions: Gabapentin, Metformin, Spironolactone, Pepcid,Novalin70/30 Over the Counter: Low dose ASA 2x/D History of alcohol / drug use?: No history of alcohol / drug abuse  CIWA: CIWA-Ar BP: (!) 117/96 Pulse Rate: (!) 108 COWS:    Allergies:  Allergies  Allergen Reactions  . Tramadol Other (See Comments)    Unknown; pt claims she doesn't recall having this allergy     Home Medications: (Not in a hospital admission)   OB/GYN Status:  No LMP recorded.  General Assessment Data Location of Assessment: [facility] TTS Assessment: In  system Is this a Tele or Face-to-Face Assessment?: Face-to-Face Is this an Initial Assessment or a Re-assessment for this encounter?: Initial Assessment Patient Accompanied by:: Other Language Other than English: No Living Arrangements: Other (Comment)(with husband, Sport and exercise psychologist) What gender do you identify as?: Female Marital status: Married Pharmacist, community name: [place] Pregnancy Status: No Living Arrangements: Spouse/significant other Can pt return to current living arrangement?: Yes Admission Status: Voluntary Is patient capable of signing voluntary admission?: Yes Referral Source: Self/Family/Friend(MCM brought patient to [facility] Insurance type: self pay  Medical Screening Exam ([place]) Medical Exam completed: Yes(Jason Gwenlyn Found, FNP)  Crisis Care Plan Living Arrangements: Spouse/significant other Name of Psychiatrist: None Name of Therapist: None  Education Status Is patient currently in school?: No Is the patient employed, unemployed or receiving disability?: Receiving disability income  Risk to self with the past 6 months Suicidal Ideation: No-Not Currently/Within Last 6 Months Has patient been a risk to self within the past 6 months prior to admission? : Yes Suicidal Intent: No-Not Currently/Within Last 6 Months Has patient had any suicidal intent within the past 6 months prior to admission? : No Is patient at risk for suicide?: No Suicidal Plan?: No-Not Currently/Within Last 6 Months Has patient had any suicidal plan within the past 6 months prior to admission? : No Specify Current Suicidal Plan: Had thought of overdosing Access to Means: Yes Specify Access to Suicidal Means: Had pills but put them away. What has been your use of drugs/alcohol within the last 12 months?: None Previous Attempts/Gestures: Yes How many times?: 4 Other Self Harm Risks: None Triggers for Past Attempts: Unpredictable Intentional Self Injurious Behavior: None Family Suicide History: No Recent  stressful life event(s): Conflict (Comment)(Husband cheating) Persecutory voices/beliefs?: No Depression: Yes Depression Symptoms: Despondent, Isolating, Loss of interest in usual pleasures, Feeling worthless/self pity, Insomnia Substance abuse history and/or treatment for substance abuse?: No Suicide prevention information given to non-admitted patients: Not applicable  Risk to Others within the past 6 months Homicidal Ideation: No Does patient have any lifetime risk of violence toward others beyond the six months prior to admission? : Unknown Thoughts of Harm to Others: No Current Homicidal Intent: No Current Homicidal Plan: No Access to Homicidal Means: No Identified Victim: No one History of harm to others?: No Assessment of Violence: None Noted Violent Behavior Description: "Not since I was 48 years old" Does patient have access to weapons?: Yes (Comment)(Swords, knives, pellet gun) Criminal Charges Pending?: No Does patient have a court date: No Is patient on probation?: No  Psychosis Hallucinations: Auditory(Occasional voices) Delusions: None noted  Mental Status Report Appearance/Hygiene: Unremarkable Eye Contact: Good Motor Activity: Freedom of movement, Unremarkable Speech: Logical/coherent Level of Consciousness: Alert Mood: Depressed, Anxious, Helpless, Sad Affect: Depressed, Anxious Anxiety Level: Moderate Thought Processes: Coherent, Relevant Judgement: Unimpaired Orientation: Person, Place, Situation, Time Obsessive Compulsive Thoughts/Behaviors: None  Cognitive Functioning Concentration: Normal Memory: Recent Intact, Remote Intact Is patient IDD: No  Insight: Good Impulse Control: Fair Appetite: Poor Have you had any weight changes? : No Change Sleep: Decreased Total Hours of Sleep: (Pt does not sleep well.  <6H/D) Vegetative Symptoms: None  ADLScreening [facility] Assessment Services) Patient's cognitive ability adequate to safely complete daily  activities?: Yes Patient able to express need for assistance with ADLs?: Yes Independently performs ADLs?: Yes (appropriate for developmental age)  Prior Inpatient Therapy Prior Inpatient Therapy: Yes Prior Therapy Dates: 07-19-2013 Prior Therapy Facilty/Provider(s): [facility] Reason for Treatment: suicide attempt  Prior Outpatient Therapy Prior Outpatient Therapy: No Does patient have an ACCT team?: No Does patient have Intensive In-House Services?  : No Does patient have [facility] services? 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) Does the patient have difficulty seeing, even when wearing glasses/contacts?: Yes Does the patient have difficulty concentrating, remembering, or making decisions?: No Patient able to express need for assistance with ADLs?: Yes Does the patient have difficulty dressing or bathing?: No Independently performs ADLs?: Yes (appropriate for developmental age) Does the patient have difficulty walking or climbing stairs?: Yes(Uses walking stick as an aid due to vision.) Weakness of Legs: None Weakness of Arms/Hands: None       Abuse/Neglect Assessment (Assessment to be complete while patient is alone) Abuse/Neglect Assessment Can Be Completed: Yes Physical Abuse: Denies Verbal Abuse: Denies Sexual Abuse: Denies Exploitation of patient/patient's resources: Denies Self-Neglect: Denies     Regulatory affairs officer (For Healthcare) Does Patient Have a Medical Advance Directive?: No Would patient like information on creating a medical advance directive?: No - Patient declined          Disposition:  Disposition Initial Assessment Completed for this Encounter: Yes Disposition of Patient: Discharge Patient refused  recommended treatment: No Mode of transportation if patient is discharged/movement?: Car Patient referred to: Other (Comment)(Given outpt resources)  On Site Evaluation by:   Reviewed with Physician:    Curlene Dolphin Ray 07/11/2018 5:25 AM

## 2018-07-11 NOTE — H&P (Signed)
Behavioral Health Medical Screening Exam  [name] is an 48 y.o. female.  Total Time spent with patient: 20 minutes  Psychiatric Specialty Exam: Physical Exam  Constitutional: She is oriented to person, place, and time. She appears well-developed and well-nourished. No distress.  HENT:  Head: Normocephalic and atraumatic.  Right Ear: External ear normal.  Left Ear: External ear normal.  Eyes: Pupils are equal, round, and reactive to light. Right eye exhibits no discharge. Left eye exhibits no discharge.  Respiratory: Effort normal. No respiratory distress.  Musculoskeletal: Normal range of motion.  Neurological: She is alert and oriented to person, place, and time.  Skin: She is not diaphoretic.  Psychiatric: She is not withdrawn and not actively hallucinating. Thought content is not paranoid and not delusional. She exhibits a depressed mood. She expresses no homicidal and no suicidal ideation.    Review of Systems  Constitutional: Negative for chills, diaphoresis, fever, malaise/fatigue and weight loss.  Respiratory: Negative for cough and shortness of breath.   Cardiovascular: Negative for chest pain.  Gastrointestinal: Negative for diarrhea, nausea and vomiting.  Psychiatric/Behavioral: Positive for depression and suicidal ideas. Negative for hallucinations, memory loss and substance abuse. The patient is nervous/anxious and has insomnia.     Blood pressure (!) 117/96, pulse (!) 108, temperature 98.4 F (36.9 C), resp. rate 18, SpO2 99 %.There is no height or weight on file to calculate BMI.  General Appearance: Casual and Fairly Groomed  Eye Contact:  Good  Speech:  Clear and Coherent and Normal Rate  Volume:  Normal  Mood:  Depressed  Affect:  Congruent and Depressed  Thought Process:  Coherent, Goal Directed and Descriptions of Associations: Intact  Orientation:  Full (Time, Place, and Person)  Thought Content:  Logical and Hallucinations: None  Suicidal Thoughts:   No  Homicidal Thoughts:  No  Memory:  Immediate;   Good Recent;   Good  Judgement:  Good  Insight:  Good  Psychomotor Activity:  Normal  Concentration: Concentration: Good and Attention Span: Good  Recall:  Good  Fund of Knowledge:Good  Language: Good  Akathisia:  Negative  Handed:  Right  AIMS (if indicated):     Assets:  Communication Skills Desire for Improvement Financial Resources/Insurance Housing Intimacy Leisure Time Physical Health  Sleep:       Musculoskeletal: Strength & Muscle Tone: within normal limits Gait & Station: normal Patient leans: N/A  Blood pressure (!) 117/96, pulse (!) 108, temperature 98.4 F (36.9 C), resp. rate 18, SpO2 99 %.  Recommendations:  Based on my evaluation the patient does not appear to have an emergency medical condition.      Disposition: No evidence of imminent risk to self or others at present.   Patient does not meet criteria for psychiatric inpatient admission. Supportive therapy provided about ongoing stressors. Discussed crisis plan, support from social network, calling 911, coming to the Emergency Department, and calling Suicide Hotline. Discussed admission to observation unit, patient declined.      Rozetta Nunnery, NP 07/11/2018, 5:56 AM

## 2018-09-11 ENCOUNTER — Encounter (HOSPITAL_COMMUNITY): Payer: Self-pay

## 2018-09-11 ENCOUNTER — Encounter (HOSPITAL_COMMUNITY): Payer: Self-pay | Admitting: *Deleted

## 2018-09-16 ENCOUNTER — Ambulatory Visit: Payer: Self-pay | Admitting: Family Medicine

## 2018-10-01 ENCOUNTER — Other Ambulatory Visit: Payer: Self-pay | Admitting: Family Medicine

## 2018-10-01 DIAGNOSIS — K219 Gastro-esophageal reflux disease without esophagitis: Secondary | ICD-10-CM

## 2018-10-01 DIAGNOSIS — E1149 Type 2 diabetes mellitus with other diabetic neurological complication: Secondary | ICD-10-CM

## 2018-11-15 ENCOUNTER — Other Ambulatory Visit: Payer: Self-pay | Admitting: Internal Medicine

## 2018-11-15 DIAGNOSIS — E1149 Type 2 diabetes mellitus with other diabetic neurological complication: Secondary | ICD-10-CM

## 2018-11-19 ENCOUNTER — Ambulatory Visit (INDEPENDENT_AMBULATORY_CARE_PROVIDER_SITE_OTHER): Payer: Self-pay | Admitting: Family Medicine

## 2018-11-19 ENCOUNTER — Encounter: Payer: Self-pay | Admitting: Family Medicine

## 2018-11-19 ENCOUNTER — Other Ambulatory Visit: Payer: Self-pay

## 2018-11-19 VITALS — BP 140/72 | HR 78 | Temp 98.4°F | Resp 16 | Ht 64.0 in | Wt 182.0 lb

## 2018-11-19 DIAGNOSIS — E119 Type 2 diabetes mellitus without complications: Secondary | ICD-10-CM

## 2018-11-19 DIAGNOSIS — M869 Osteomyelitis, unspecified: Secondary | ICD-10-CM | POA: Insufficient documentation

## 2018-11-19 DIAGNOSIS — Z794 Long term (current) use of insulin: Secondary | ICD-10-CM

## 2018-11-19 DIAGNOSIS — E1165 Type 2 diabetes mellitus with hyperglycemia: Secondary | ICD-10-CM

## 2018-11-19 DIAGNOSIS — I10 Essential (primary) hypertension: Secondary | ICD-10-CM

## 2018-11-19 DIAGNOSIS — E282 Polycystic ovarian syndrome: Secondary | ICD-10-CM

## 2018-11-19 DIAGNOSIS — Z8639 Personal history of other endocrine, nutritional and metabolic disease: Secondary | ICD-10-CM

## 2018-11-19 DIAGNOSIS — E1149 Type 2 diabetes mellitus with other diabetic neurological complication: Secondary | ICD-10-CM

## 2018-11-19 DIAGNOSIS — Z23 Encounter for immunization: Secondary | ICD-10-CM

## 2018-11-19 DIAGNOSIS — F314 Bipolar disorder, current episode depressed, severe, without psychotic features: Secondary | ICD-10-CM

## 2018-11-19 LAB — POCT URINALYSIS DIPSTICK
Bilirubin, UA: NEGATIVE
Blood, UA: NEGATIVE
Glucose, UA: POSITIVE — AB
Leukocytes, UA: NEGATIVE
Nitrite, UA: NEGATIVE
Protein, UA: NEGATIVE
Spec Grav, UA: 1.025 (ref 1.010–1.025)
Urobilinogen, UA: 0.2 E.U./dL
pH, UA: 5.5 (ref 5.0–8.0)

## 2018-11-19 LAB — POCT GLYCOSYLATED HEMOGLOBIN (HGB A1C): Hemoglobin A1C: 9.2 % — AB (ref 4.0–5.6)

## 2018-11-19 LAB — GLUCOSE, POCT (MANUAL RESULT ENTRY)
POC Glucose: 269 mg/dl — AB (ref 70–99)
POC Glucose: 244 mg/dl — AB (ref 70–99)

## 2018-11-19 MED ORDER — METFORMIN HCL 1000 MG PO TABS: 1000.0000 mg | ORAL_TABLET | Freq: Two times a day (BID) | ORAL | 1 refills | Status: DC

## 2018-11-19 MED ORDER — INSULIN LISPRO 100 UNIT/ML ~~LOC~~ SOLN
10.0000 [IU] | Freq: Once | SUBCUTANEOUS | Status: AC
  Administered 2018-11-19: 10 [IU] via SUBCUTANEOUS

## 2018-11-19 MED ORDER — SPIRONOLACTONE 100 MG PO TABS: 100.0000 mg | ORAL_TABLET | Freq: Every day | ORAL | 0 refills | Status: DC

## 2018-11-19 MED ORDER — "INSULIN SYRINGE 30G X 1/2"" 0.5 ML MISC": 1.0000 | Freq: Two times a day (BID) | 11 refills | Status: DC

## 2018-11-19 MED ORDER — INSULIN ASPART PROT & ASPART (70-30 MIX) 100 UNIT/ML ~~LOC~~ SUSP: 20.0000 [IU] | Freq: Two times a day (BID) | SUBCUTANEOUS | 5 refills | Status: DC

## 2018-11-19 NOTE — Progress Notes (Signed)
Patient [name]  Established Patient Office Visit  Subjective:  Patient ID: [name], female    DOB: 1970-12-29  Age: 48 y.o. MRN: [id number]  CC:  Chief Complaint  Patient presents with  . Diabetes  . Hyperlipidemia    HPI [name], a 48 year old female that presents accompanied by partner Rolla Plate for a follow up of chronic conditions. She has been lost to follow up over the past several months. She has been out of insulin and diabetic supplies for greater than 1 month. She has not been checking blood glucose consistently over the past month.   Diabetes She has type 2 diabetes mellitus. Her disease course has been worsening. Associated symptoms include blurred vision, foot paresthesias, foot ulcerations, polyuria and visual change. Pertinent negatives for diabetes include no chest pain, no fatigue, no polydipsia, no polyphagia, no weakness and no weight loss. Diabetic complications include peripheral neuropathy. Risk factors for coronary artery disease include dyslipidemia, hypertension and sedentary lifestyle. Current diabetic treatment includes insulin injections and oral agent (monotherapy). She is compliant with treatment some of the time. She is following a generally unhealthy diet. When asked about meal planning, she reported none. She has not had a previous visit with a dietitian. She rarely participates in exercise. She sees a podiatrist.Eye exam is not current.  Hyperlipidemia This is a recurrent problem. The current episode started more than 1 year ago. The problem is controlled. Pertinent negatives include no chest pain. The current treatment provides mild improvement of lipids.  Hypertension This is a chronic problem. The problem is controlled. Associated symptoms include blurred vision. Pertinent negatives include no chest pain. Risk factors for coronary artery disease include diabetes mellitus and dyslipidemia. Compliance  problems include diet and exercise.  There is no history of sleep apnea or a thyroid problem.    Past Medical History:  Diagnosis Date  . Anxiety   . Depression   . Diabetes mellitus without complication ([place])   . High cholesterol   . Hypertension   . Neuropathy   . PCOS (polycystic ovarian syndrome)     Past Surgical History:  Procedure Laterality Date  . ELBOW SURGERY    . INSERTION OF AHMED VALVE Right 09/07/2015   Procedure: INSERTION OF AHMED VALVE;  Surgeon: Hurman Horn, MD;  Location: [facility];  Service: Ophthalmology;  Laterality: Right;  . LASER PHOTO ABLATION Right 09/07/2015   Procedure: LASER PHOTO ABLATION;  Surgeon: Hurman Horn, MD;  Location: [place];  Service: Ophthalmology;  Laterality: Right;  . PARS PLANA VITRECTOMY Right 09/07/2015   Procedure: PARS PLANA VITRECTOMY WITH 25 GAUGE,, with panretinal endolaser,;  Surgeon: Hurman Horn, MD;  Location: [facility];  Service: Ophthalmology;  Laterality: Right;    Family History  Problem Relation Age of Onset  . Depression Mother   . Anxiety disorder Mother   . Diabetes Mother   . Diabetes Father   . Diabetes Brother     Social History   Socioeconomic History  . Marital status: Married    Spouse name: Not on file  . Number of children: Not on file  . Years of education: Not on file  . Highest education level: Not on file  Occupational History  . Not on file  Social Needs  . Financial resource strain: Not on file  . Food insecurity    Worry: Not on file    Inability: Not on file  . Transportation needs  Medical: Not on file    Non-medical: Not on file  Tobacco Use  . Smoking status: Former Smoker    Types: Cigarettes    Quit date: 09/12/2000    Years since quitting: 18.1  . Smokeless tobacco: Never Used  Substance and Sexual Activity  . Alcohol use: Not Currently    Comment: occasional  . Drug use: Yes    Types: Marijuana    Comment: daily   . Sexual activity: Yes    Birth control/protection: None,  Condom  Lifestyle  . Physical activity    Days per week: Not on file    Minutes per session: Not on file  . Stress: Not on file  Relationships  . Social Musician on phone: Not on file    Gets together: Not on file    Attends religious service: Not on file    Active member of club or organization: Not on file    Attends meetings of clubs or organizations: Not on file    Relationship status: Not on file  . Intimate partner violence    Fear of current or ex partner: Not on file    Emotionally abused: Not on file    Physically abused: Not on file    Forced sexual activity: Not on file  Other Topics Concern  . Not on file  Social History Narrative  . Not on file    Outpatient Medications Prior to Visit  Medication Sig Dispense Refill  . ALPRAZolam (XANAX) 1 MG tablet Take 1 mg by mouth at bedtime as needed for anxiety.    Marland Kitchen amphetamine-dextroamphetamine (ADDERALL) 30 MG tablet Take 30 mg by mouth daily.    Marland Kitchen aspirin EC 81 MG tablet Take 1 tablet (81 mg total) by mouth 2 (two) times daily. 60 tablet 2  . atorvastatin (LIPITOR) 40 MG tablet Take 1 tablet (40 mg total) by mouth every evening. 90 tablet 2  . famotidine (PEPCID) 20 MG tablet Take 1 tablet by mouth twice daily 30 tablet 0  . FLUoxetine (PROZAC) 20 MG capsule Take 20 mg by mouth daily.    Marland Kitchen gabapentin (NEURONTIN) 800 MG tablet TAKE 2 TABLETS BY MOUTH IN THE MORNING AND 3 TABLETS IN THE EVENING 150 tablet 0  . glucose blood (ONE TOUCH ULTRA TEST) test strip 1 each by Other route 2 (two) times daily. And lancets 2/day. 100 each 0  . insulin aspart protamine- aspart (NOVOLOG MIX 70/30) (70-30) 100 UNIT/ML injection Inject 0.35 mLs (35 Units total) into the skin 2 (two) times daily with a meal. 30 mL 5  . metFORMIN (GLUCOPHAGE) 1000 MG tablet Take 1 tablet (1,000 mg total) by mouth 2 (two) times daily with a meal. 180 tablet 1  . Multiple Vitamins-Minerals (MULTIVITAMIN GUMMIES ADULT PO) Take 1 capsule by mouth  daily.     No facility-administered medications prior to visit.     Allergies  Allergen Reactions  . Tramadol Other (See Comments)    Unknown; pt claims she doesn't recall having this allergy     ROS Review of Systems  Constitutional: Negative for fatigue and weight loss.  HENT: Negative.   Eyes: Positive for blurred vision and visual disturbance (Occasional blurred vision).  Cardiovascular: Negative for chest pain.  Endocrine: Positive for polyuria. Negative for polydipsia and polyphagia.  Genitourinary: Negative for difficulty urinating and dyspareunia.  Musculoskeletal: Negative.   Skin: Negative.   Neurological: Positive for numbness (bilateral lower extremities). Negative for weakness.  Hematological: Negative.  Psychiatric/Behavioral: Negative for suicidal ideas.      Objective:    Physical Exam  Constitutional: She is oriented to person, place, and time. She appears well-developed and well-nourished.  HENT:  Head: Normocephalic and atraumatic.  Eyes: Pupils are equal, round, and reactive to light.  Neck: Normal range of motion.  Cardiovascular: Normal rate and regular rhythm.  Pulmonary/Chest: Effort normal.  Abdominal: Soft. Bowel sounds are normal.  Musculoskeletal: Normal range of motion.  Neurological: She is alert and oriented to person, place, and time.  Skin: Skin is warm.  Right diabetic foot ulcer, healing, 100% granulation  Psychiatric: She has a normal mood and affect. Her behavior is normal. Judgment and thought content normal.    BP (!) 146/76 (BP Location: Right Arm, Patient Position: Sitting, Cuff Size: Normal) Comment: manually  Pulse 78   Temp 98.4 F (36.9 C) (Oral)   Resp 16   Ht 5\' 4"  (1.626 m)   Wt 182 lb (82.6 kg)   LMP 11/01/2018   SpO2 100%   BMI 31.24 kg/m  Wt Readings from Last 3 Encounters:  11/19/18 182 lb (82.6 kg)  06/03/18 208 lb (94.3 kg)  03/20/18 200 lb (90.7 kg)     Health Maintenance Due  Topic Date Due  .  PNEUMOCOCCAL POLYSACCHARIDE VACCINE AGE 67-64 HIGH RISK  08/14/1974  . FOOT EXAM  07/05/2018  . OPHTHALMOLOGY EXAM  09/03/2018    There are no preventive [name] reminders to display for this patient.  Lab Results  Component Value Date   TSH 2.580 06/03/2018   Lab Results  Component Value Date   WBC 10.0 06/03/2018   HGB 11.8 06/03/2018   HCT 35.3 06/03/2018   MCV 85 06/03/2018   PLT 307 06/03/2018   Lab Results  Component Value Date   NA 137 06/03/2018   K 4.5 06/03/2018   CO2 22 06/03/2018   GLUCOSE 239 (H) 06/03/2018   BUN 14 06/03/2018   CREATININE 0.68 06/03/2018   BILITOT 0.2 06/03/2018   ALKPHOS 55 06/03/2018   AST 10 06/03/2018   ALT 11 06/03/2018   PROT 6.3 06/03/2018   ALBUMIN 3.6 (L) 06/03/2018   CALCIUM 8.9 06/03/2018   ANIONGAP 7 03/22/2018   Lab Results  Component Value Date   CHOL 160 06/03/2018   Lab Results  Component Value Date   HDL 58 06/03/2018   Lab Results  Component Value Date   LDLCALC 80 06/03/2018   Lab Results  Component Value Date   TRIG 109 06/03/2018   Lab Results  Component Value Date   CHOLHDL 2.8 06/03/2018   Lab Results  Component Value Date   HGBA1C 9.2 (A) 11/19/2018      Assessment & Plan:   Problem List Items Addressed This Visit    None    Visit Diagnoses    Type 2 diabetes mellitus not at goal [facility])    -  Primary   Relevant Orders   HgB A1c (Completed)   Urinalysis Dipstick (Completed)   Glucose (CBG) (Completed)      No orders of the defined types were placed in this encounter.  Type 2 diabetes mellitus not at goal (HCC) - HgB A1c - Urinalysis Dipstick - Glucose (CBG) - insulin lispro (HUMALOG) injection 10 Units - Basic Metabolic Panel - metFORMIN (GLUCOPHAGE) 1000 MG tablet; Take 1 tablet (1,000 mg total) by mouth 2 (two) times daily with a meal.  Dispense: 180 tablet; Refill: 1 - Insulin Syringe-Needle U-100 (INSULIN SYRINGE .5CC/30GX1/2") 30G  X 1/2" 0.5 ML MISC; 1 each by Does not apply  route every 12 (twelve) hours.  Dispense: 100 each; Refill: 11 - CBC - Glucose (CBG)  Hypertension, unspecified type - Continue medication, monitor blood pressure at home. Continue DASH diet. Reminder to go to the ER if any CP, SOB, nausea, dizziness, severe HA, changes vision/speech, left arm numbness and tingling and jaw pain.   - spironolactone (ALDACTONE) 100 MG tablet; Take 1 tablet (100 mg total) by mouth daily.  Dispense: 90 tablet; Refill: 0  History of hyperlipidemia The 10-year ASCVD risk score Denman George(Goff DC Jr., et al., 2013) is: 1.8%   Values used to calculate the score:     Age: [age] years     Sex: Female     Is Non-Hispanic African American: No     Diabetic: Yes     Tobacco smoker: No     Systolic Blood Pressure: 140 mmHg     Is BP treated: Yes     HDL Cholesterol: 58 mg/dL     Total Cholesterol: 160 mg/dL  Osteomyelitis of right foot, unspecified type (HCC) Continue to follow up with podiatry as scheduled.   Bipolar disorder, current episode depressed, severe, without psychotic features (HCC) Follow up with psychiatry as scheduled. No medication changes warranted on today.    Type 2 diabetes mellitus with hyperglycemia, with long-term current use of insulin (HCC) - insulin aspart protamine- aspart (NOVOLOG MIX 70/30) (70-30) 100 UNIT/ML injection; Inject 0.2 mLs (20 Units total) into the skin 2 (two) times daily with a meal.  Dispense: 30 mL; Refill: 5  Other diabetic neurological complication associated with type 2 diabetes mellitus (HCC) - metFORMIN (GLUCOPHAGE) 1000 MG tablet; Take 1 tablet (1,000 mg total) by mouth 2 (two) times daily with a meal.  Dispense: 180 tablet; Refill: 1  PCOS (polycystic ovarian syndrome) Continue metformin and spirinolactone as prescribed.    Follow-up:  Follow up in 1 month for medication management.    Nolon NationsLachina Moore Keitha Kolk  APRN, MSN, FNP-C [name] [address]  , [place] [id number]  [phone]

## 2018-11-19 NOTE — Patient Instructions (Signed)
Gastroparesis ° °Gastroparesis is a condition in which food takes longer than normal to empty from the stomach. The condition is usually long-lasting (chronic). It may also be called delayed gastric emptying. °There is no cure, but there are treatments and things that you can do at home to help relieve symptoms. Treating the underlying condition that causes gastroparesis can also help relieve symptoms. °What are the causes? °In many cases, the cause of this condition is not known. Possible causes include: °· A hormone (endocrine) disorder, such as hypothyroidism or diabetes. °· A nervous system disease, such as Parkinson's disease or multiple sclerosis. °· Cancer, infection, or surgery that affects the stomach or vagus nerve. The vagus nerve runs from your chest, through your neck, to the lower part of your brain. °· A connective tissue disorder, such as scleroderma. °· Certain medicines. °What increases the risk? °You are more likely to develop this condition if you: °· Have certain disorders or diseases, including: °? An endocrine disorder. °? An eating disorder. °? Amyloidosis. °? Scleroderma. °? Parkinson's disease. °? Multiple sclerosis. °? Cancer or infection of the stomach or the vagus nerve. °· Have had surgery on the stomach or vagus nerve. °· Take certain medicines. °· Are female. °What are the signs or symptoms? °Symptoms of this condition include: °· Feeling full after eating very little. °· Nausea. °· Vomiting. °· Heartburn. °· Abdominal bloating. °· Inconsistent blood sugar (glucose) levels on blood tests. °· Lack of appetite. °· Weight loss. °· Acid from the stomach coming up into the esophagus (gastroesophageal reflux). °· Sudden tightening (spasm) of the stomach, which can be painful. °Symptoms may come and go. Some people may not notice any symptoms. °How is this diagnosed? °This condition is diagnosed with tests, such as: °· Tests that check how long it takes food to move through the stomach and  intestines. These tests include: °? Upper gastrointestinal (GI) series. For this test, you drink a liquid that shows up well on X-rays, and then X-rays will be taken of your intestines. °? Gastric emptying scintigraphy. For this test, you eat food that contains a small amount of radioactive material, and then scans are taken. °? Wireless capsule GI monitoring system. For this test, you swallow a pill (capsule) that records information about how foods and fluid move through your stomach. °· Gastric manometry. For this test, a tube is passed down your throat and into your stomach to measure electrical and muscular activity. °· Endoscopy. For this test, a long, thin tube is passed down your throat and into your stomach to check for problems in your stomach lining. °· Ultrasound. This test uses sound waves to create images of inside the body. This can help rule out gallbladder disease or pancreatitis as a cause of your symptoms. °How is this treated? °There is no cure for gastroparesis. Treatment may include: °· Treating the underlying cause. °· Managing your symptoms by making changes to your diet and exercise habits. °· Taking medicines to control nausea and vomiting and to stimulate stomach muscles. °· Getting food through a feeding tube in the hospital. This may be done in severe cases. °· Having surgery to insert a device into your body that helps improve stomach emptying and control nausea and vomiting (gastric neurostimulator). °Follow these instructions at home: °· Take over-the-counter and prescription medicines only as told by your health care provider. °· Follow instructions from your health care provider about eating or drinking restrictions. Your health care provider may recommend that you: °? Eat   smaller meals more often. °? Eat low-fat foods. °? Eat low-fiber forms of high-fiber foods. For example, eat cooked vegetables instead of raw vegetables. °? Have only liquid foods instead of solid foods. Liquid  foods are easier to digest. °· Drink enough fluid to keep your urine pale yellow. °· Exercise as often as told by your health care provider. °· Keep all follow-up visits as told by your health care provider. This is important. °Contact a health care provider if you: °· Notice that your symptoms do not improve with treatment. °· Have new symptoms. °Get help right away if you: °· Have severe abdominal pain that does not improve with treatment. °· Have nausea that is severe or does not go away. °· Cannot drink fluids without vomiting. °Summary °· Gastroparesis is a chronic condition in which food takes longer than normal to empty from the stomach. °· Symptoms include nausea, vomiting, heartburn, abdominal bloating, and loss of appetite. °· Eating smaller portions, and low-fat, low-fiber foods may help you manage your symptoms. °· Get help right away if you have severe abdominal pain. °This information is not intended to replace advice given to you by your health care provider. Make sure you discuss any questions you have with your health care provider. °Document Released: 12/19/2004 Document Revised: 03/19/2017 Document Reviewed: 10/24/2016 °Elsevier Patient Education © 2020 [employer] ° °

## 2018-11-20 LAB — CBC
Hematocrit: 37.9 % (ref 34.0–46.6)
Hemoglobin: 12.8 g/dL (ref 11.1–15.9)
MCH: 28.5 pg (ref 26.6–33.0)
MCHC: 33.8 g/dL (ref 31.5–35.7)
MCV: 84 fL (ref 79–97)
Platelets: 325 10*3/uL (ref 150–450)
RBC: 4.49 x10E6/uL (ref 3.77–5.28)
RDW: 13.1 % (ref 11.7–15.4)
WBC: 9.1 10*3/uL (ref 3.4–10.8)

## 2018-11-20 LAB — BASIC METABOLIC PANEL
BUN/Creatinine Ratio: 14 (ref 9–23)
BUN: 10 mg/dL (ref 6–24)
CO2: 22 mmol/L (ref 20–29)
Calcium: 9.2 mg/dL (ref 8.7–10.2)
Chloride: 99 mmol/L (ref 96–106)
Creatinine, Ser: 0.7 mg/dL (ref 0.57–1.00)
GFR calc Af Amer: 120 mL/min/{1.73_m2} (ref >59)
GFR calc non Af Amer: 104 mL/min/{1.73_m2} (ref >59)
Glucose: 235 mg/dL — ABNORMAL HIGH (ref 65–99)
Potassium: 4.3 mmol/L (ref 3.5–5.2)
Sodium: 137 mmol/L (ref 134–144)

## 2018-11-21 ENCOUNTER — Other Ambulatory Visit: Payer: Self-pay | Admitting: Family Medicine

## 2018-11-22 ENCOUNTER — Other Ambulatory Visit: Payer: Self-pay

## 2018-11-22 ENCOUNTER — Telehealth: Payer: Self-pay

## 2018-11-22 DIAGNOSIS — E1149 Type 2 diabetes mellitus with other diabetic neurological complication: Secondary | ICD-10-CM

## 2018-11-22 MED ORDER — GABAPENTIN 800 MG PO TABS: ORAL_TABLET | ORAL | 3 refills | Status: DC

## 2018-11-22 NOTE — Telephone Encounter (Signed)
Called and spoke with patient, advised that only abnormality was with glucose and hgba1c. Advised that she eat a carbohydrate modified diet. She states she is restarting the insulin and metformin today and I asked her to make sure she takes it everyday and to not miss a dose. Asked that she follow up as directed and bring a blood sugar diary of glucose reading to next appointment. Thanks!

## 2018-11-22 NOTE — Telephone Encounter (Signed)
-----   Message from Dorena Dew, [employer] sent at 11/21/2018  3:31 PM EST ----- Regarding: lab results Reviewed labs, the only abnormality found was the patient's glucose.Patient's hemoglobin a1C is 9.2. Recommend carbohydrate modified diet.  Please ensure that patient has restarted insulin and metformin. Remind the patient of the importance of medication adherence in order to achieve positive outcomes.   Please follow up in office as scheduled, check feet daily, and bring blood sugar diary or glucometer to next appointment.   Donia Pounds  APRN, MSN, FNP-C [facility] [address] [place], [place] [address] [phone]

## 2018-11-30 ENCOUNTER — Other Ambulatory Visit: Payer: Self-pay | Admitting: Internal Medicine

## 2018-11-30 DIAGNOSIS — K219 Gastro-esophageal reflux disease without esophagitis: Secondary | ICD-10-CM

## 2018-12-04 ENCOUNTER — Other Ambulatory Visit: Payer: Self-pay | Admitting: Family Medicine

## 2018-12-04 ENCOUNTER — Other Ambulatory Visit: Payer: Self-pay

## 2018-12-04 ENCOUNTER — Telehealth: Payer: Self-pay

## 2018-12-04 DIAGNOSIS — K219 Gastro-esophageal reflux disease without esophagitis: Secondary | ICD-10-CM

## 2018-12-04 MED ORDER — PROMETHAZINE HCL 12.5 MG PO TABS: 12.5000 mg | ORAL_TABLET | Freq: Four times a day (QID) | ORAL | 0 refills | Status: DC | PRN

## 2018-12-04 MED ORDER — FAMOTIDINE 20 MG PO TABS: 20.0000 mg | ORAL_TABLET | Freq: Two times a day (BID) | ORAL | 3 refills | Status: DC

## 2018-12-04 NOTE — Telephone Encounter (Signed)
Hey [place],  Patient called saying she was going to get a new nausea medication at appointment last week since the zofran wasn't working. Nothing has been sent in, please advise. Thanks!

## 2018-12-04 NOTE — Telephone Encounter (Signed)
Patient aware that promethazine has been sent to pharmacy. Thanks!

## 2018-12-04 NOTE — Progress Notes (Signed)
Meds ordered this encounter  Medications   promethazine (PHENERGAN) 12.5 MG tablet    Sig: Take 1 tablet (12.5 mg total) by mouth every 6 (six) hours as needed for nausea or vomiting.    Dispense:  30 tablet    Refill:  0    Order Specific Question:   Supervising Provider    Answer:   JEGEDE, OLUGBEMIGA E [[id number]]     Lachina Moore Hollis  APRN, MSN, FNP-C [facility] [address]  [place], [place] [address] [phone]  

## 2019-02-06 NOTE — Telephone Encounter (Signed)
I am no longer this patient's PCP. Please send to Crystal King, NP. Thanks

## 2019-02-19 ENCOUNTER — Encounter: Payer: Self-pay | Admitting: Family Medicine

## 2019-02-19 ENCOUNTER — Other Ambulatory Visit: Payer: Self-pay

## 2019-02-19 ENCOUNTER — Ambulatory Visit (INDEPENDENT_AMBULATORY_CARE_PROVIDER_SITE_OTHER): Payer: Self-pay | Admitting: Family Medicine

## 2019-02-19 VITALS — BP 109/67 | HR 82 | Temp 98.3°F | Ht 64.0 in | Wt 163.2 lb

## 2019-02-19 DIAGNOSIS — L089 Local infection of the skin and subcutaneous tissue, unspecified: Secondary | ICD-10-CM

## 2019-02-19 DIAGNOSIS — R7309 Other abnormal glucose: Secondary | ICD-10-CM

## 2019-02-19 DIAGNOSIS — R8290 Unspecified abnormal findings in urine: Secondary | ICD-10-CM

## 2019-02-19 DIAGNOSIS — E119 Type 2 diabetes mellitus without complications: Secondary | ICD-10-CM

## 2019-02-19 DIAGNOSIS — Z09 Encounter for follow-up examination after completed treatment for conditions other than malignant neoplasm: Secondary | ICD-10-CM

## 2019-02-19 DIAGNOSIS — M869 Osteomyelitis, unspecified: Secondary | ICD-10-CM

## 2019-02-19 LAB — POCT URINALYSIS DIPSTICK
Glucose, UA: POSITIVE — AB
Ketones, UA: 15
Nitrite, UA: NEGATIVE
Protein, UA: POSITIVE — AB
Spec Grav, UA: >=1.03 — AB (ref 1.010–1.025)
Urobilinogen, UA: 0.2 E.U./dL
pH, UA: 5.5 (ref 5.0–8.0)

## 2019-02-19 LAB — POCT GLYCOSYLATED HEMOGLOBIN (HGB A1C): Hemoglobin A1C: 9.8 % — AB (ref 4.0–5.6)

## 2019-02-19 LAB — GLUCOSE, POCT (MANUAL RESULT ENTRY): POC Glucose: 255 mg/dl — AB (ref 70–99)

## 2019-02-19 MED ORDER — DOXYCYCLINE HYCLATE 100 MG PO TABS: 100.0000 mg | ORAL_TABLET | Freq: Two times a day (BID) | ORAL | 0 refills | Status: DC

## 2019-02-19 MED ORDER — GLIPIZIDE 10 MG PO TABS: 10.0000 mg | ORAL_TABLET | Freq: Two times a day (BID) | ORAL | 3 refills | Status: DC

## 2019-02-19 NOTE — Progress Notes (Signed)
[facility]   Established Patient Office Visit  Subjective:  Patient ID: [name], female    DOB: 1970-01-04  Age: 49 y.o. MRN: [id number]  CC:  Chief Complaint  Patient presents with   Follow-up    DM   Wound Check    Right foot    HPI [name] is a 49 year old female who presents for Follow Up today.   Past Medical History:  Diagnosis Date   Anxiety    Depression    Diabetes mellitus without complication (HCC)    High cholesterol    Hypertension    Neuropathy    PCOS (polycystic ovarian syndrome)    Type 1 diabetes (HCC)    Current Status: Since her last office visit, she is doing well with no complaints. She is not taking Insulin as prescribed, because she is not eating. She was previously diagnosed with Gastroparesis, for frequent nausea and vomiting. She has had a 40 lb weight loss in 6 months. She states that there is no medications that will help with her symptoms. She has not been monitoring her blood glucose levels regularly lately. She denies fatigue, frequent urination, blurred vision, excessive hunger, excessive thirst, weight gain, weight loss, and poor wound healing. She continues to check her feet regularly. She is currently following up at [facility]. She has next appointment with them on 02/24/2019 and follows up with them every 2 weeks. She denies visual changes, chest pain, cough, shortness of breath, heart palpitations, and falls. She has occasional headaches and dizziness with position changes. Denies severe headaches, confusion, seizures, double vision, and blurred vision, nausea and vomiting. She denies fevers, chills, recent infections, weight loss, and night sweats. No reports of GI problems such as diarrhea, and constipation. She has no reports of blood in stools, dysuria and hematuria. No depression or anxiety, and denies suicidal ideations, homicidal ideations,  or auditory hallucinations. She denies pain today. She continues to have problems with toe wound, which she is using Silver Cream to help with infection. She is not able to afford co-pay at [facility].   Past Surgical History:  Procedure Laterality Date   ELBOW SURGERY     INSERTION OF AHMED VALVE Right 09/07/2015   Procedure: INSERTION OF AHMED VALVE;  Surgeon: Edmon Crape, MD;  Location: [facility] OR;  Service: Ophthalmology;  Laterality: Right;   LASER PHOTO ABLATION Right 09/07/2015   Procedure: LASER PHOTO ABLATION;  Surgeon: Edmon Crape, MD;  Location: [facility] OR;  Service: Ophthalmology;  Laterality: Right;   PARS PLANA VITRECTOMY Right 09/07/2015   Procedure: PARS PLANA VITRECTOMY WITH 25 GAUGE,, with panretinal endolaser,;  Surgeon: Edmon Crape, MD;  Location: [facility] OR;  Service: Ophthalmology;  Laterality: Right;    Family History  Problem Relation Age of Onset   Depression Mother    Anxiety disorder Mother    Diabetes Mother    Diabetes Father    Diabetes Brother     Social History   Socioeconomic History   Marital status: Married    Spouse name: Not on file   Number of children: Not on file   Years of education: Not on file   Highest education level: Not on file  Occupational History   Not on file  Tobacco Use   Smoking status: Former Smoker    Types: Cigarettes    Quit date: 09/12/2000    Years since quitting: 18.4  Smokeless tobacco: Never Used  Substance and Sexual Activity   Alcohol use: Not Currently    Comment: occasional   Drug use: Yes    Types: Marijuana    Comment: daily    Sexual activity: Yes    Birth control/protection: None, Condom  Other Topics Concern   Not on file  Social History Narrative   Not on file   Social Determinants of Health   Financial Resource Strain:    Difficulty of Paying Living Expenses: Not on file  Food Insecurity:    Worried About Charity fundraiser in the Last Year: Not on file   [employer] of Food  in the Last Year: Not on file  Transportation Needs:    Lack of Transportation (Medical): Not on file   Lack of Transportation (Non-Medical): Not on file  Physical Activity:    Days of Exercise per Week: Not on file   Minutes of Exercise per Session: Not on file  Stress:    Feeling of Stress : Not on file  Social Connections:    Frequency of Communication with Friends and Family: Not on file   Frequency of Social Gatherings with Friends and Family: Not on file   Attends Religious Services: Not on file   Active Member of Clubs or Organizations: Not on file   Attends Archivist Meetings: Not on file   Marital Status: Not on file  Intimate Partner Violence:    Fear of Current or Ex-Partner: Not on file   Emotionally Abused: Not on file   Physically Abused: Not on file   Sexually Abused: Not on file    Outpatient Medications Prior to Visit  Medication Sig Dispense Refill   ALPRAZolam (XANAX) 1 MG tablet Take 1 mg by mouth at bedtime as needed for anxiety.     amphetamine-dextroamphetamine (ADDERALL) 30 MG tablet Take 30 mg by mouth daily.     aspirin EC 81 MG tablet Take 1 tablet (81 mg total) by mouth 2 (two) times daily. 60 tablet 2   atorvastatin (LIPITOR) 40 MG tablet Take 1 tablet (40 mg total) by mouth every evening. 90 tablet 2   famotidine (PEPCID) 20 MG tablet Take 1 tablet (20 mg total) by mouth 2 (two) times daily. 30 tablet 3   FLUoxetine (PROZAC) 20 MG capsule Take 20 mg by mouth daily.     gabapentin (NEURONTIN) 800 MG tablet TAKE 2 TABLETS BY MOUTH IN THE MORNING AND 3 TABLETS IN THE EVENING 150 tablet 3   glucose blood (ONE TOUCH ULTRA TEST) test strip 1 each by Other route 2 (two) times daily. And lancets 2/day. 100 each 0   metFORMIN (GLUCOPHAGE) 1000 MG tablet Take 1 tablet (1,000 mg total) by mouth 2 (two) times daily with a meal. 180 tablet 1   Multiple Vitamins-Minerals (MULTIVITAMIN GUMMIES ADULT PO) Take 1 capsule by mouth  daily.     promethazine (PHENERGAN) 12.5 MG tablet Take 1 tablet (12.5 mg total) by mouth every 6 (six) hours as needed for nausea or vomiting. 30 tablet 0   spironolactone (ALDACTONE) 100 MG tablet Take 1 tablet (100 mg total) by mouth daily. 90 tablet 0   insulin aspart protamine- aspart (NOVOLOG MIX 70/30) (70-30) 100 UNIT/ML injection Inject 0.2 mLs (20 Units total) into the skin 2 (two) times daily with a meal. (Patient not taking: Reported on 02/19/2019) 30 mL 5   Insulin Syringe-Needle U-100 (INSULIN SYRINGE .5CC/30GX1/2") 30G X 1/2" 0.5 ML MISC 1 each by  Does not apply route every 12 (twelve) hours. (Patient not taking: Reported on 02/19/2019) 100 each 11   No facility-administered medications prior to visit.    Allergies  Allergen Reactions   Tramadol Other (See Comments)    Unknown; pt claims she doesn't recall having this allergy     ROS Review of Systems  Constitutional: Positive for appetite change, fatigue and unexpected weight change.  HENT: Negative.   Eyes: Negative.   Respiratory: Negative.   Cardiovascular: Negative.   Gastrointestinal: Negative.   Endocrine: Negative.   Genitourinary: Negative.   Musculoskeletal: Negative.   Skin: Positive for wound (right foot).  Allergic/Immunologic: Negative.   Neurological: Positive for dizziness (occasional ) and headaches (occasional ).  Hematological: Negative.   Psychiatric/Behavioral: Negative.       Objective:    Physical Exam  Constitutional: She is oriented to person, place, and time. She appears well-developed and well-nourished.  HENT:  Head: Normocephalic and atraumatic.  Eyes: Conjunctivae are normal.  Cardiovascular: Normal rate, regular rhythm, normal heart sounds and intact distal pulses.  Pulmonary/Chest: Effort normal and breath sounds normal.  Abdominal: Soft. Bowel sounds are normal.  Musculoskeletal:        General: Normal range of motion.     Cervical back: Normal range of motion and neck  supple.     Comments: Right toe wound.  Neurological: She is alert and oriented to person, place, and time. She has normal reflexes.  Skin: Skin is warm and dry.  Psychiatric: She has a normal mood and affect. Her behavior is normal. Judgment and thought content normal.  Nursing note and vitals reviewed.   BP 109/67    Pulse 82    Temp 98.3 F (36.8 C) (Oral)    Ht 5\' 4"  (1.626 m)    Wt 163 lb 3.2 oz (74 kg)    SpO2 100%    BMI 28.01 kg/m  Wt Readings from Last 3 Encounters:  02/19/19 163 lb 3.2 oz (74 kg)  11/19/18 182 lb (82.6 kg)  06/03/18 208 lb (94.3 kg)     Health Maintenance Due  Topic Date Due   PNEUMOCOCCAL POLYSACCHARIDE VACCINE AGE 38-64 HIGH RISK  08/14/1974   OPHTHALMOLOGY EXAM  09/03/2018    There are no preventive care reminders to display for this patient.  Lab Results  Component Value Date   TSH 2.580 06/03/2018   Lab Results  Component Value Date   WBC 9.1 11/19/2018   HGB 12.8 11/19/2018   HCT 37.9 11/19/2018   MCV 84 11/19/2018   PLT 325 11/19/2018   Lab Results  Component Value Date   NA 137 11/19/2018   K 4.3 11/19/2018   CO2 22 11/19/2018   GLUCOSE 235 (H) 11/19/2018   BUN 10 11/19/2018   CREATININE 0.70 11/19/2018   BILITOT 0.2 06/03/2018   ALKPHOS 55 06/03/2018   AST 10 06/03/2018   ALT 11 06/03/2018   PROT 6.3 06/03/2018   ALBUMIN 3.6 (L) 06/03/2018   CALCIUM 9.2 11/19/2018   ANIONGAP 7 03/22/2018   Lab Results  Component Value Date   CHOL 160 06/03/2018   Lab Results  Component Value Date   HDL 58 06/03/2018   Lab Results  Component Value Date   LDLCALC 80 06/03/2018   Lab Results  Component Value Date   TRIG 109 06/03/2018   Lab Results  Component Value Date   CHOLHDL 2.8 06/03/2018   Lab Results  Component Value Date   HGBA1C 9.8 (A) 02/19/2019  Assessment & Plan:   1. Type 2 diabetes mellitus not at goal [facility]) She will continue medication as prescribed, to decrease foods/beverages high in sugars and  carbs and follow Heart Healthy or DASH diet. Increase physical activity to at least 30 minutes cardio exercise daily.  - POCT urinalysis dipstick - POCT glycosylated hemoglobin (Hb A1C) - POCT glucose (manual entry) - glipiZIDE (GLUCOTROL) 10 MG tablet; Take 1 tablet (10 mg total) by mouth 2 (two) times daily before a meal.  Dispense: 60 tablet; Refill: 3 - doxycycline (VIBRA-TABS) 100 MG tablet; Take 1 tablet (100 mg total) by mouth 2 (two) times daily.  Dispense: 20 tablet; Refill: 0  2. Hemoglobin A1C greater than 9%, indicating poor diabetic control Hgb A1c at 9.8 today. We will continue to monitor.  - glipiZIDE (GLUCOTROL) 10 MG tablet; Take 1 tablet (10 mg total) by mouth 2 (two) times daily before a meal.  Dispense: 60 tablet; Refill: 3  3. Osteomyelitis of right foot, unspecified type (HCC)  4. Toe infection We will initiate antibiotic today.  - doxycycline (VIBRA-TABS) 100 MG tablet; Take 1 tablet (100 mg total) by mouth 2 (two) times daily.  Dispense: 20 tablet; Refill: 0  5. Abnormal urinalysis Results are pending.  - Urine Culture  6. Follow up She will follow up in 3 months.   Meds ordered this encounter  Medications   glipiZIDE (GLUCOTROL) 10 MG tablet    Sig: Take 1 tablet (10 mg total) by mouth 2 (two) times daily before a meal.    Dispense:  60 tablet    Refill:  3   doxycycline (VIBRA-TABS) 100 MG tablet    Sig: Take 1 tablet (100 mg total) by mouth 2 (two) times daily.    Dispense:  20 tablet    Refill:  0    Orders Placed This Encounter  Procedures   Urine Culture   POCT urinalysis dipstick   POCT glycosylated hemoglobin (Hb A1C)   POCT glucose (manual entry)    Referral Orders  No referral(s) requested today    Raliegh Ip,  MSN, FNP-BC [facility] [address] [place], [place] [address] [phone] fax   Problem List Items Addressed This Visit       Musculoskeletal and Integument   Osteomyelitis of right foot (HCC)    Other Visit Diagnoses    Type 2 diabetes mellitus not at goal [facility])    -  Primary   Relevant Medications   glipiZIDE (GLUCOTROL) 10 MG tablet   doxycycline (VIBRA-TABS) 100 MG tablet   Other Relevant Orders   POCT urinalysis dipstick (Completed)   POCT glycosylated hemoglobin (Hb A1C) (Completed)   POCT glucose (manual entry) (Completed)   Hemoglobin A1C greater than 9%, indicating poor diabetic control       Relevant Medications   glipiZIDE (GLUCOTROL) 10 MG tablet   Toe infection       Relevant Medications   doxycycline (VIBRA-TABS) 100 MG tablet   Abnormal urinalysis       Relevant Orders   Urine Culture (Completed)   Follow up          Meds ordered this encounter  Medications   glipiZIDE (GLUCOTROL) 10 MG tablet    Sig: Take 1 tablet (10 mg total) by mouth 2 (two) times daily before a meal.    Dispense:  60 tablet    Refill:  3   doxycycline (VIBRA-TABS) 100 MG tablet  Sig: Take 1 tablet (100 mg total) by mouth 2 (two) times daily.    Dispense:  20 tablet    Refill:  0    Follow-up: Return in about 3 months (around 05/19/2019).    Kallie Locks, FNP

## 2019-02-19 NOTE — Patient Instructions (Signed)
Diabetes Mellitus and Nutrition, Adult When you have diabetes (diabetes mellitus), it is very important to have healthy eating habits because your blood sugar (glucose) levels are greatly affected by what you eat and drink. Eating healthy foods in the appropriate amounts, at about the same times every day, can help you:  Control your blood glucose.  Lower your risk of heart disease.  Improve your blood pressure.  Reach or maintain a healthy weight. Every person with diabetes is different, and each person has different needs for a meal plan. Your health care provider may recommend that you work with a diet and nutrition specialist (dietitian) to make a meal plan that is best for you. Your meal plan may vary depending on factors such as:  The calories you need.  The medicines you take.  Your weight.  Your blood glucose, blood pressure, and cholesterol levels.  Your activity level.  Other health conditions you have, such as heart or kidney disease. How do carbohydrates affect me? Carbohydrates, also called carbs, affect your blood glucose level more than any other type of food. Eating carbs naturally raises the amount of glucose in your blood. Carb counting is a method for keeping track of how many carbs you eat. Counting carbs is important to keep your blood glucose at a healthy level, especially if you use insulin or take certain oral diabetes medicines. It is important to know how many carbs you can safely have in each meal. This is different for every person. Your dietitian can help you calculate how many carbs you should have at each meal and for each snack. Foods that contain carbs include:  Bread, cereal, rice, pasta, and crackers.  Potatoes and corn.  Peas, beans, and lentils.  Milk and yogurt.  Fruit and juice.  Desserts, such as cakes, cookies, ice cream, and candy. How does alcohol affect me? Alcohol can cause a sudden decrease in blood glucose (hypoglycemia),  especially if you use insulin or take certain oral diabetes medicines. Hypoglycemia can be a life-threatening condition. Symptoms of hypoglycemia (sleepiness, dizziness, and confusion) are similar to symptoms of having too much alcohol. If your health care provider says that alcohol is safe for you, follow these guidelines:  Limit alcohol intake to no more than 1 drink per day for nonpregnant women and 2 drinks per day for men. One drink equals 12 oz of beer, 5 oz of wine, or 1 oz of hard liquor.  Do not drink on an empty stomach.  Keep yourself hydrated with water, diet soda, or unsweetened iced tea.  Keep in mind that regular soda, juice, and other mixers may contain a lot of sugar and must be counted as carbs. What are tips for following this plan?  Reading food labels  Start by checking the serving size on the "Nutrition Facts" label of packaged foods and drinks. The amount of calories, carbs, fats, and other nutrients listed on the label is based on one serving of the item. Many items contain more than one serving per package.  Check the total grams (g) of carbs in one serving. You can calculate the number of servings of carbs in one serving by dividing the total carbs by 15. For example, if a food has 30 g of total carbs, it would be equal to 2 servings of carbs.  Check the number of grams (g) of saturated and trans fats in one serving. Choose foods that have low or no amount of these fats.  Check the number of   milligrams (mg) of salt (sodium) in one serving. Most people should limit total sodium intake to less than 2,300 mg per day.  Always check the nutrition information of foods labeled as "low-fat" or "nonfat". These foods may be higher in added sugar or refined carbs and should be avoided.  Talk to your dietitian to identify your daily goals for nutrients listed on the label. Shopping  Avoid buying canned, premade, or processed foods. These foods tend to be high in fat, sodium,  and added sugar.  Shop around the outside edge of the grocery store. This includes fresh fruits and vegetables, bulk grains, fresh meats, and fresh dairy. Cooking  Use low-heat cooking methods, such as baking, instead of high-heat cooking methods like deep frying.  Cook using healthy oils, such as olive, canola, or sunflower oil.  Avoid cooking with butter, cream, or high-fat meats. Meal planning  Eat meals and snacks regularly, preferably at the same times every day. Avoid going long periods of time without eating.  Eat foods high in fiber, such as fresh fruits, vegetables, beans, and whole grains. Talk to your dietitian about how many servings of carbs you can eat at each meal.  Eat 4-6 ounces (oz) of lean protein each day, such as lean meat, chicken, fish, eggs, or tofu. One oz of lean protein is equal to: ? 1 oz of meat, chicken, or fish. ? 1 egg. ?  cup of tofu.  Eat some foods each day that contain healthy fats, such as avocado, nuts, seeds, and fish. Lifestyle  Check your blood glucose regularly.  Exercise regularly as told by your health care provider. This may include: ? 150 minutes of moderate-intensity or vigorous-intensity exercise each week. This could be brisk walking, biking, or water aerobics. ? Stretching and doing strength exercises, such as yoga or weightlifting, at least 2 times a week.  Take medicines as told by your health care provider.  Do not use any products that contain nicotine or tobacco, such as cigarettes and e-cigarettes. If you need help quitting, ask your health care provider.  Work with a Veterinary surgeon or diabetes educator to identify strategies to manage stress and any emotional and social challenges. Questions to ask a health care provider  Do I need to meet with a diabetes educator?  Do I need to meet with a dietitian?  What number can I call if I have questions?  When are the best times to check my blood glucose? Where to find more  information:  American Diabetes Association: diabetes.org  Academy of Nutrition and Dietetics: www.eatright.[employer] of Diabetes and Digestive and Kidney Diseases (NIH): CarFlippers.tn Summary  A healthy meal plan will help you control your blood glucose and maintain a healthy lifestyle.  Working with a diet and nutrition specialist (dietitian) can help you make a meal plan that is best for you.  Keep in mind that carbohydrates (carbs) and alcohol have immediate effects on your blood glucose levels. It is important to count carbs and to use alcohol carefully. This information is not intended to replace advice given to you by your health care provider. Make sure you discuss any questions you have with your health care provider. Document Revised: 12/01/2016 Document Reviewed: 01/24/2016 Elsevier Patient Education  2020 Elsevier Inc.    Glipizide tablets What is this medicine? GLIPIZIDE (GLIP i zide) helps to treat type 2 diabetes. Treatment is combined with diet and exercise. The medicine helps your body to use insulin better. This medicine may be  used for other purposes; ask your health care provider or pharmacist if you have questions. COMMON BRAND NAME(S): Glucotrol What should I tell my health care provider before I take this medicine? They need to know if you have any of these conditions:  diabetic ketoacidosis  glucose-6-phosphate dehydrogenase deficiency  heart disease  kidney disease  liver disease  porphyria  severe infection or injury  thyroid disease  an unusual or allergic reaction to glipizide, sulfa drugs, other medicines, foods, dyes, or preservatives  pregnant or trying to get pregnant  breast-feeding How should I use this medicine? Take this medicine by mouth. Swallow with a drink of water. Do not take with food. Take it 30 minutes before a meal. Follow the directions on the prescription label. If you take this medicine once a day,  take it 30 minutes before breakfast. Take your doses at the same time each day. Do not take more often than directed. Talk to your pediatrician regarding the use of this medicine in children. Special care may be needed. Elderly patients over 35 years old may have a stronger reaction and need a smaller dose. Overdosage: If you think you have taken too much of this medicine contact a poison control center or emergency room at once. NOTE: This medicine is only for you. Do not share this medicine with others. What if I miss a dose? If you miss a dose, take it as soon as you can. If it is almost time for your next dose, take only that dose. Do not take double or extra doses. What may interact with this medicine?  bosentan  chloramphenicol  cisapride  clarithromycin  medicines for fungal or yeast infections  metoclopramide  probenecid  warfarin Many medications may cause an increase or decrease in blood sugar, these include:  alcohol containing beverages  aspirin and aspirin-like drugs  chloramphenicol  chromium  diuretics  female hormones, like estrogens or progestins and birth control pills  heart medicines  isoniazid  female hormones or anabolic steroids  medicines for weight loss  medicines for allergies, asthma, cold, or cough  medicines for mental problems  medicines called MAO Inhibitors like Nardil, Parnate, Marplan, Eldepryl  niacin  NSAIDs, medicines for pain and inflammation, like ibuprofen or naproxen  pentamidine  phenytoin  probenecid  quinolone antibiotics like ciprofloxacin, levofloxacin, ofloxacin  some herbal dietary supplements  steroid medicines like prednisone or cortisone  thyroid medicine This list may not describe all possible interactions. Give your health care provider a list of all the medicines, herbs, non-prescription drugs, or dietary supplements you use. Also tell them if you smoke, drink alcohol, or use illegal drugs. Some  items may interact with your medicine. What should I watch for while using this medicine? Visit your doctor or health care professional for regular checks on your progress. A test called the HbA1C (A1C) will be monitored. This is a simple blood test. It measures your blood sugar control over the last 2 to 3 months. You will receive this test every 3 to 6 months. Learn how to check your blood sugar. Learn the symptoms of low and high blood sugar and how to manage them. Always carry a quick-source of sugar with you in case you have symptoms of low blood sugar. Examples include hard sugar candy or glucose tablets. Make sure others know that you can choke if you eat or drink when you develop serious symptoms of low blood sugar, such as seizures or unconsciousness. They must get medical help at  once. Tell your doctor or health care professional if you have high blood sugar. You might need to change the dose of your medicine. If you are sick or exercising more than usual, you might need to change the dose of your medicine. Do not skip meals. Ask your doctor or health care professional if you should avoid alcohol. Many nonprescription cough and cold products contain sugar or alcohol. These can affect blood sugar. This medicine can make you more sensitive to the sun. Keep out of the sun. If you cannot avoid being in the sun, wear protective clothing and use sunscreen. Do not use sun lamps or tanning beds/booths. Wear a medical ID bracelet or chain, and carry a card that describes your disease and details of your medicine and dosage times. What side effects may I notice from receiving this medicine? Side effects that you should report to your doctor or health care professional as soon as possible:  allergic reactions like skin rash, itching or hives, swelling of the face, lips, or tongue  breathing problems  dark urine  fever, chills, sore throat  signs and symptoms of low blood sugar such as feeling  anxious, confusion, dizziness, increased hunger, unusually weak or tired, sweating, shakiness, cold, irritable, headache, blurred vision, fast heartbeat, loss of consciousness  unusual bleeding or bruising  yellowing of the eyes or skin Side effects that usually do not require medical attention (report to your doctor or health care professional if they continue or are bothersome):  diarrhea  dizziness  headache  heartburn  nausea  stomach gas This list may not describe all possible side effects. Call your doctor for medical advice about side effects. You may report side effects to FDA at 1-800-FDA-1088. Where should I keep my medicine? Keep out of the reach of children. Store at room temperature below 30 degrees C (86 degrees F). Throw away any unused medicine after the expiration date. NOTE: This sheet is a summary. It may not cover all possible information. If you have questions about this medicine, talk to your doctor, pharmacist, or health care provider.  2020 Elsevier/Gold Standard (2012-04-03 14:42:46)

## 2019-02-22 LAB — URINE CULTURE

## 2019-02-25 ENCOUNTER — Other Ambulatory Visit: Payer: Self-pay

## 2019-02-25 ENCOUNTER — Encounter (HOSPITAL_COMMUNITY): Payer: Self-pay

## 2019-02-25 ENCOUNTER — Inpatient Hospital Stay (HOSPITAL_COMMUNITY): Payer: Self-pay

## 2019-02-25 ENCOUNTER — Emergency Department (HOSPITAL_COMMUNITY): Payer: Self-pay

## 2019-02-25 ENCOUNTER — Inpatient Hospital Stay (HOSPITAL_COMMUNITY)
Admission: EM | Admit: 2019-02-25 | Discharge: 2019-02-26 | DRG: 638 | Disposition: A | Discharge status: Home / Self Care | Payer: Self-pay | Attending: Internal Medicine | Admitting: Internal Medicine

## 2019-02-25 ENCOUNTER — Other Ambulatory Visit: Payer: Self-pay | Admitting: Family Medicine

## 2019-02-25 DIAGNOSIS — E1042 Type 1 diabetes mellitus with diabetic polyneuropathy: Secondary | ICD-10-CM | POA: Diagnosis present

## 2019-02-25 DIAGNOSIS — Z20822 Contact with and (suspected) exposure to covid-19: Secondary | ICD-10-CM | POA: Diagnosis present

## 2019-02-25 DIAGNOSIS — F314 Bipolar disorder, current episode depressed, severe, without psychotic features: Secondary | ICD-10-CM | POA: Diagnosis present

## 2019-02-25 DIAGNOSIS — K3184 Gastroparesis: Secondary | ICD-10-CM | POA: Diagnosis present

## 2019-02-25 DIAGNOSIS — L97519 Non-pressure chronic ulcer of other part of right foot with unspecified severity: Secondary | ICD-10-CM | POA: Diagnosis present

## 2019-02-25 DIAGNOSIS — I10 Essential (primary) hypertension: Secondary | ICD-10-CM | POA: Diagnosis present

## 2019-02-25 DIAGNOSIS — Z7982 Long term (current) use of aspirin: Secondary | ICD-10-CM

## 2019-02-25 DIAGNOSIS — E11628 Type 2 diabetes mellitus with other skin complications: Secondary | ICD-10-CM | POA: Diagnosis present

## 2019-02-25 DIAGNOSIS — E1065 Type 1 diabetes mellitus with hyperglycemia: Secondary | ICD-10-CM | POA: Diagnosis present

## 2019-02-25 DIAGNOSIS — F1290 Cannabis use, unspecified, uncomplicated: Secondary | ICD-10-CM | POA: Diagnosis present

## 2019-02-25 DIAGNOSIS — IMO0002 Reserved for concepts with insufficient information to code with codable children: Secondary | ICD-10-CM | POA: Diagnosis present

## 2019-02-25 DIAGNOSIS — Z87891 Personal history of nicotine dependence: Secondary | ICD-10-CM

## 2019-02-25 DIAGNOSIS — Z833 Family history of diabetes mellitus: Secondary | ICD-10-CM

## 2019-02-25 DIAGNOSIS — E1165 Type 2 diabetes mellitus with hyperglycemia: Secondary | ICD-10-CM | POA: Diagnosis present

## 2019-02-25 DIAGNOSIS — E10621 Type 1 diabetes mellitus with foot ulcer: Principal | ICD-10-CM | POA: Diagnosis present

## 2019-02-25 DIAGNOSIS — E1043 Type 1 diabetes mellitus with diabetic autonomic (poly)neuropathy: Secondary | ICD-10-CM | POA: Diagnosis present

## 2019-02-25 DIAGNOSIS — H548 Legal blindness, as defined in USA: Secondary | ICD-10-CM | POA: Diagnosis present

## 2019-02-25 DIAGNOSIS — Z79899 Other long term (current) drug therapy: Secondary | ICD-10-CM

## 2019-02-25 DIAGNOSIS — N390 Urinary tract infection, site not specified: Secondary | ICD-10-CM

## 2019-02-25 DIAGNOSIS — E10319 Type 1 diabetes mellitus with unspecified diabetic retinopathy without macular edema: Secondary | ICD-10-CM | POA: Diagnosis present

## 2019-02-25 DIAGNOSIS — L0390 Cellulitis, unspecified: Secondary | ICD-10-CM

## 2019-02-25 DIAGNOSIS — F4310 Post-traumatic stress disorder, unspecified: Secondary | ICD-10-CM | POA: Diagnosis present

## 2019-02-25 DIAGNOSIS — Z915 Personal history of self-harm: Secondary | ICD-10-CM

## 2019-02-25 DIAGNOSIS — Z885 Allergy status to narcotic agent status: Secondary | ICD-10-CM

## 2019-02-25 DIAGNOSIS — Z794 Long term (current) use of insulin: Secondary | ICD-10-CM

## 2019-02-25 DIAGNOSIS — Z818 Family history of other mental and behavioral disorders: Secondary | ICD-10-CM

## 2019-02-25 DIAGNOSIS — L089 Local infection of the skin and subcutaneous tissue, unspecified: Secondary | ICD-10-CM

## 2019-02-25 DIAGNOSIS — E282 Polycystic ovarian syndrome: Secondary | ICD-10-CM | POA: Diagnosis present

## 2019-02-25 DIAGNOSIS — L03031 Cellulitis of right toe: Secondary | ICD-10-CM | POA: Diagnosis present

## 2019-02-25 DIAGNOSIS — R112 Nausea with vomiting, unspecified: Secondary | ICD-10-CM | POA: Diagnosis present

## 2019-02-25 DIAGNOSIS — F419 Anxiety disorder, unspecified: Secondary | ICD-10-CM | POA: Diagnosis present

## 2019-02-25 LAB — GLUCOSE, CAPILLARY
Glucose-Capillary: 237 mg/dL — ABNORMAL HIGH (ref 70–99)
Glucose-Capillary: 157 mg/dL — ABNORMAL HIGH (ref 70–99)
Glucose-Capillary: 250 mg/dL — ABNORMAL HIGH (ref 70–99)
Glucose-Capillary: 167 mg/dL — ABNORMAL HIGH (ref 70–99)

## 2019-02-25 LAB — CBC WITH DIFFERENTIAL/PLATELET
Abs Immature Granulocytes: 0.24 10*3/uL — ABNORMAL HIGH (ref 0.00–0.07)
Basophils Absolute: 0.1 10*3/uL (ref 0.0–0.1)
Basophils Relative: 1 %
Eosinophils Absolute: 0.1 10*3/uL (ref 0.0–0.5)
Eosinophils Relative: 0 %
HCT: 37.7 % (ref 36.0–46.0)
Hemoglobin: 12.5 g/dL (ref 12.0–15.0)
Immature Granulocytes: 2 %
Lymphocytes Relative: 9 %
Lymphs Abs: 1.4 10*3/uL (ref 0.7–4.0)
MCH: 29.8 pg (ref 26.0–34.0)
MCHC: 33.2 g/dL (ref 30.0–36.0)
MCV: 90 fL (ref 80.0–100.0)
Monocytes Absolute: 0.7 10*3/uL (ref 0.1–1.0)
Monocytes Relative: 4 %
Neutro Abs: 13.7 10*3/uL — ABNORMAL HIGH (ref 1.7–7.7)
Neutrophils Relative %: 84 %
Platelets: 370 10*3/uL (ref 150–400)
RBC: 4.19 MIL/uL (ref 3.87–5.11)
RDW: 12.8 % (ref 11.5–15.5)
WBC: 16.2 10*3/uL — ABNORMAL HIGH (ref 4.0–10.5)
nRBC: 0 % (ref 0.0–0.2)

## 2019-02-25 LAB — COMPREHENSIVE METABOLIC PANEL
ALT: 12 U/L (ref 0–44)
AST: 12 U/L — ABNORMAL LOW (ref 15–41)
Albumin: 3.2 g/dL — ABNORMAL LOW (ref 3.5–5.0)
Alkaline Phosphatase: 60 U/L (ref 38–126)
Anion gap: 14 (ref 5–15)
BUN: 31 mg/dL — ABNORMAL HIGH (ref 6–20)
CO2: 21 mmol/L — ABNORMAL LOW (ref 22–32)
Calcium: 8.8 mg/dL — ABNORMAL LOW (ref 8.9–10.3)
Chloride: 103 mmol/L (ref 98–111)
Creatinine, Ser: 0.94 mg/dL (ref 0.44–1.00)
GFR calc Af Amer: >60 mL/min (ref >60)
GFR calc non Af Amer: >60 mL/min (ref >60)
Glucose, Bld: 325 mg/dL — ABNORMAL HIGH (ref 70–99)
Potassium: 4.8 mmol/L (ref 3.5–5.1)
Sodium: 138 mmol/L (ref 135–145)
Total Bilirubin: 0.9 mg/dL (ref 0.3–1.2)
Total Protein: 7.5 g/dL (ref 6.5–8.1)

## 2019-02-25 LAB — MRSA PCR SCREENING: MRSA by PCR: NEGATIVE

## 2019-02-25 LAB — PREALBUMIN: Prealbumin: 25.4 mg/dL (ref 18–38)

## 2019-02-25 LAB — LACTIC ACID, PLASMA: Lactic Acid, Venous: 1.9 mmol/L (ref 0.5–1.9)

## 2019-02-25 LAB — HEMOGLOBIN A1C
Hgb A1c MFr Bld: 9.3 % — ABNORMAL HIGH (ref 4.8–5.6)
Mean Plasma Glucose: 220.21 mg/dL

## 2019-02-25 LAB — LIPASE, BLOOD: Lipase: 21 U/L (ref 11–51)

## 2019-02-25 LAB — I-STAT BETA HCG BLOOD, ED (MC, WL, AP ONLY): I-stat hCG, quantitative: <5 m[IU]/mL (ref <5)

## 2019-02-25 LAB — SARS CORONAVIRUS 2 (TAT 6-24 HRS): SARS Coronavirus 2: NEGATIVE

## 2019-02-25 LAB — CBG MONITORING, ED: Glucose-Capillary: 303 mg/dL — ABNORMAL HIGH (ref 70–99)

## 2019-02-25 MED ORDER — ENOXAPARIN SODIUM 40 MG/0.4ML ~~LOC~~ SOLN
40.0000 mg | SUBCUTANEOUS | Status: DC
  Administered 2019-02-25 – 2019-02-26 (×2): 40 mg via SUBCUTANEOUS
  Filled 2019-02-25 (×2): qty 0.4

## 2019-02-25 MED ORDER — VANCOMYCIN HCL 750 MG/150ML IV SOLN
750.0000 mg | Freq: Two times a day (BID) | INTRAVENOUS | Status: DC
  Administered 2019-02-25 – 2019-02-26 (×2): 750 mg via INTRAVENOUS
  Filled 2019-02-25 (×4): qty 150

## 2019-02-25 MED ORDER — GABAPENTIN 400 MG PO CAPS
2400.0000 mg | ORAL_CAPSULE | Freq: Every day | ORAL | Status: DC
  Administered 2019-02-25: 2400 mg via ORAL
  Filled 2019-02-25: qty 6

## 2019-02-25 MED ORDER — GABAPENTIN 800 MG PO TABS: 800.0000 mg | ORAL_TABLET | Freq: Two times a day (BID) | ORAL | Status: DC

## 2019-02-25 MED ORDER — ASPIRIN EC 81 MG PO TBEC
81.0000 mg | DELAYED_RELEASE_TABLET | Freq: Two times a day (BID) | ORAL | Status: DC
  Administered 2019-02-25 – 2019-02-26 (×3): 81 mg via ORAL
  Filled 2019-02-25 (×3): qty 1

## 2019-02-25 MED ORDER — ALPRAZOLAM 1 MG PO TABS: 1.0000 mg | ORAL_TABLET | Freq: Every evening | ORAL | Status: DC | PRN

## 2019-02-25 MED ORDER — ENSURE MAX PROTEIN PO LIQD
11.0000 [oz_av] | Freq: Every day | ORAL | Status: DC
  Administered 2019-02-25 – 2019-02-26 (×2): 11 [oz_av] via ORAL
  Filled 2019-02-25 (×2): qty 330

## 2019-02-25 MED ORDER — ONDANSETRON HCL 4 MG/2ML IJ SOLN: 4.0000 mg | Freq: Four times a day (QID) | INTRAMUSCULAR | Status: DC | PRN

## 2019-02-25 MED ORDER — PRO-STAT SUGAR FREE PO LIQD
30.0000 mL | Freq: Two times a day (BID) | ORAL | Status: DC
  Administered 2019-02-25 (×2): 30 mL via ORAL
  Filled 2019-02-25 (×2): qty 30

## 2019-02-25 MED ORDER — GABAPENTIN 400 MG PO CAPS
1600.0000 mg | ORAL_CAPSULE | Freq: Every day | ORAL | Status: DC
  Administered 2019-02-26: 11:00:00 1600 mg via ORAL
  Filled 2019-02-25: qty 4

## 2019-02-25 MED ORDER — SPIRONOLACTONE 100 MG PO TABS
100.0000 mg | ORAL_TABLET | Freq: Every day | ORAL | Status: DC
  Administered 2019-02-25 – 2019-02-26 (×2): 100 mg via ORAL
  Filled 2019-02-25 (×2): qty 1

## 2019-02-25 MED ORDER — FAMOTIDINE 20 MG PO TABS
20.0000 mg | ORAL_TABLET | Freq: Two times a day (BID) | ORAL | Status: DC
  Administered 2019-02-25 – 2019-02-26 (×3): 20 mg via ORAL
  Filled 2019-02-25 (×3): qty 1

## 2019-02-25 MED ORDER — INSULIN DETEMIR 100 UNIT/ML ~~LOC~~ SOLN
8.0000 [IU] | Freq: Two times a day (BID) | SUBCUTANEOUS | Status: DC
  Administered 2019-02-25 – 2019-02-26 (×3): 8 [IU] via SUBCUTANEOUS
  Filled 2019-02-25 (×4): qty 0.08

## 2019-02-25 MED ORDER — AMPHETAMINE-DEXTROAMPHETAMINE 10 MG PO TABS
30.0000 mg | ORAL_TABLET | Freq: Every day | ORAL | Status: DC
  Administered 2019-02-26: 30 mg via ORAL

## 2019-02-25 MED ORDER — SULFAMETHOXAZOLE-TRIMETHOPRIM 800-160 MG PO TABS: 1.0000 | ORAL_TABLET | Freq: Two times a day (BID) | ORAL | 0 refills | Status: DC

## 2019-02-25 MED ORDER — FLUOXETINE HCL 20 MG PO CAPS
20.0000 mg | ORAL_CAPSULE | Freq: Every day | ORAL | Status: DC
  Administered 2019-02-26: 20 mg via ORAL
  Filled 2019-02-25: qty 1

## 2019-02-25 MED ORDER — ADULT MULTIVITAMIN W/MINERALS CH
1.0000 | ORAL_TABLET | Freq: Every day | ORAL | Status: DC
  Administered 2019-02-25 – 2019-02-26 (×2): 1 via ORAL
  Filled 2019-02-25 (×2): qty 1

## 2019-02-25 MED ORDER — HYDROCODONE-ACETAMINOPHEN 5-325 MG PO TABS: 1.0000 | ORAL_TABLET | Freq: Four times a day (QID) | ORAL | Status: DC | PRN

## 2019-02-25 MED ORDER — ACETAMINOPHEN 325 MG PO TABS: 650.0000 mg | ORAL_TABLET | Freq: Four times a day (QID) | ORAL | Status: DC | PRN

## 2019-02-25 MED ORDER — ATORVASTATIN CALCIUM 40 MG PO TABS
40.0000 mg | ORAL_TABLET | Freq: Every evening | ORAL | Status: DC
  Administered 2019-02-25: 40 mg via ORAL
  Filled 2019-02-25: qty 1

## 2019-02-25 MED ORDER — SODIUM CHLORIDE 0.9 % IV SOLN: INTRAVENOUS | Status: AC

## 2019-02-25 MED ORDER — ONDANSETRON HCL 4 MG PO TABS: 4.0000 mg | ORAL_TABLET | Freq: Four times a day (QID) | ORAL | Status: DC | PRN

## 2019-02-25 MED ORDER — GABAPENTIN 400 MG PO CAPS
1600.0000 mg | ORAL_CAPSULE | Freq: Once | ORAL | Status: AC
  Administered 2019-02-25: 14:00:00 1600 mg via ORAL
  Filled 2019-02-25: qty 4

## 2019-02-25 MED ORDER — INSULIN ASPART 100 UNIT/ML ~~LOC~~ SOLN
0.0000 [IU] | SUBCUTANEOUS | Status: DC
  Administered 2019-02-25 (×2): 5 [IU] via SUBCUTANEOUS
  Administered 2019-02-25 (×2): 3 [IU] via SUBCUTANEOUS
  Administered 2019-02-26: 2 [IU] via SUBCUTANEOUS

## 2019-02-25 MED ORDER — VANCOMYCIN HCL 1250 MG/250ML IV SOLN
1250.0000 mg | Freq: Once | INTRAVENOUS | Status: AC
  Administered 2019-02-25: 1250 mg via INTRAVENOUS
  Filled 2019-02-25: qty 250

## 2019-02-25 MED ORDER — METRONIDAZOLE IN NACL 5-0.79 MG/ML-% IV SOLN
500.0000 mg | Freq: Three times a day (TID) | INTRAVENOUS | Status: DC
  Administered 2019-02-25 – 2019-02-26 (×3): 500 mg via INTRAVENOUS
  Filled 2019-02-25 (×3): qty 100

## 2019-02-25 MED ORDER — METRONIDAZOLE IN NACL 5-0.79 MG/ML-% IV SOLN
500.0000 mg | Freq: Once | INTRAVENOUS | Status: AC
  Administered 2019-02-25: 500 mg via INTRAVENOUS
  Filled 2019-02-25: qty 100

## 2019-02-25 MED ORDER — METOCLOPRAMIDE HCL 5 MG/ML IJ SOLN
10.0000 mg | Freq: Once | INTRAMUSCULAR | Status: AC
  Administered 2019-02-25: 10 mg via INTRAVENOUS
  Filled 2019-02-25: qty 2

## 2019-02-25 MED ORDER — SENNOSIDES-DOCUSATE SODIUM 8.6-50 MG PO TABS: 1.0000 | ORAL_TABLET | Freq: Every evening | ORAL | Status: DC | PRN

## 2019-02-25 MED ORDER — SODIUM CHLORIDE 0.9 % IV SOLN
2.0000 g | Freq: Once | INTRAVENOUS | Status: AC
  Administered 2019-02-25: 2 g via INTRAVENOUS
  Filled 2019-02-25: qty 2

## 2019-02-25 MED ORDER — SODIUM CHLORIDE 0.9 % IV BOLUS
1000.0000 mL | Freq: Once | INTRAVENOUS | Status: AC
  Administered 2019-02-25: 1000 mL via INTRAVENOUS

## 2019-02-25 MED ORDER — SODIUM CHLORIDE 0.9 % IV SOLN
2.0000 g | Freq: Three times a day (TID) | INTRAVENOUS | Status: DC
  Administered 2019-02-25 – 2019-02-26 (×3): 2 g via INTRAVENOUS
  Filled 2019-02-25 (×5): qty 2

## 2019-02-25 MED ORDER — ACETAMINOPHEN 650 MG RE SUPP: 650.0000 mg | Freq: Four times a day (QID) | RECTAL | Status: DC | PRN

## 2019-02-25 MED ORDER — VANCOMYCIN HCL IN DEXTROSE 1-5 GM/200ML-% IV SOLN
1000.0000 mg | Freq: Once | INTRAVENOUS | Status: DC
  Administered 2019-02-25: 1000 mg via INTRAVENOUS

## 2019-02-25 NOTE — Plan of Care (Signed)

## 2019-02-25 NOTE — Progress Notes (Signed)
Initial Nutrition Assessment  DOCUMENTATION CODES:   Not applicable  INTERVENTION:  - will order Ensure Max once/day, each supplement provides 150 kcal and 30 grams protein. - will order 30 mL Prostat BID, each supplement provides 100 kcal and 15 grams of protein. - will order daily multivitamin with minerals. - will monitor for need for diet education prior to d/c.    NUTRITION DIAGNOSIS:   Inadequate oral intake related to acute illness, nausea, vomiting as evidenced by per patient/family report, meal completion < 50%.  GOAL:   Patient will meet greater than or equal to 90% of their needs  MONITOR:   PO intake, Supplement acceptance, Labs, Weight trends, Skin  REASON FOR ASSESSMENT:   Consult Wound healing  ASSESSMENT:   49 y.o. female with medical history of poorly controlled insulin-dependent DM with diabetic retinopathy and gastroparesis, HTN, and bipolar disorder. She presented to the ED on 2/22 with N/V and worsening R great toe infection x3 days. She was prescribed oral abx for infection but was unable to take it d/t N/V. She does not have any sensation in the toe. She has not been taking insulin recently.  Patient is out of the room at this time. Per chart review, she was last seen by a [place] RD in 2015. She reported at the time of admission that she has been having ongoing N/V x3 days with inability to keep even medication down. Patient had also reported hx of gastroparesis.   Per chart review, weight today is 151 lb. Weight on 2/17 was 163 lb and weight on 11/19/18 was 182 lb. This indicates 12 lb weight loss (7% body weight) in the past week and 31 lb weight loss (17% body weight) in the past 3 months. Patient meets or is at very high risk of meeting criteria for malnutrition; unable to confirm without NFPE.   Per notes: - diabetic foot infection - uncontrolled DM with A1c earlier in Feb. was 9.1% - intractable N/V   Labs reviewed; CBGs: 303 and 237  mg/dl, BUN: 31 mg/dl. Medications reviewed; sliding scale novolog, 8 units levemir BID. IVF; NS @ 50 ml/hr.      NUTRITION - FOCUSED PHYSICAL EXAM:  unable to completion at this time.   Diet Order:   Diet Order            Diet Carb Modified Fluid consistency: Thin; Room service appropriate? Yes  Diet effective now              EDUCATION NEEDS:   Not appropriate for education at this time  Skin:  Skin Assessment: Skin Integrity Issues: Skin Integrity Issues:: Diabetic Ulcer Diabetic Ulcer: R great toe  Last BM:  PTA/unknown  Height:   Ht Readings from Last 1 Encounters:  02/25/19 5\' 4"  (1.626 m)    Weight:   Wt Readings from Last 1 Encounters:  02/25/19 68.5 kg    Ideal Body Weight:  54.54 kg  BMI:  Body mass index is 25.92 kg/m.  Estimated Nutritional Needs:   Kcal:  2050-2260 kcal  Protein:  102-113 grams  Fluid:  >/= 2.2 L/day     04-06-1992, MS, RD, LDN, CNSC Inpatient Clinical Dietitian RD pager # available in [facility]  After hours/weekend pager # available in [facility]

## 2019-02-25 NOTE — ED Notes (Signed)
Pt confided that in the last couple of weeks she has had thoughts of suicide. Pt has seen her therapist for this and denies any current thoughst of suicide

## 2019-02-25 NOTE — ED Notes (Signed)
Report called to [facility]. Pt is a/o vss in no acute distress and stable for transport upstairs.

## 2019-02-25 NOTE — Consult Note (Signed)
WOC Nurse Consult Note: Reason for Consult: Chronic wound on right great toe.  Patient is followed by the outpatient wound care center in [facility] by Dr. Laroy Apple. She is currently using silver gel, which is a non formulary item here, so we will use an alternate antimicrobial (nonadherent) while in house.  Patient is in agreement with the POC.  Wound type: Neuropathic, moisture, infectious Pressure Injury POA: N/A Measurement: 2.2cm x 4.4cm x 0.2cm Wound bed: 70% red, 30% black eschar (dry) Drainage (amount, consistency, odor)  Small amount serosanguinous exudate Periwound: mild edema and erythema at dorsal foot. Dressing procedure/placement/frequency: I am assisted in my assessment today by L. Chestine Spore, RN who is also a Chartered loss adjuster (WTA-C) and a Artist. Guidance for topical care is provided via the Orders and includes cleansing twice daily with soap and water, rinsing and drying thoroughly followed by application of a single layer antimicrobial astringent dressing (xeroform). This will be covered with dry gauze and secured with conform bandaging/tape.  The patient is on systemic antibiotics.  She will return to the outpatient [facility] and the continuing care of Dr. Leanord Hawking post discharge.  WOC nursing team will not follow, but will remain available to this patient, the nursing and medical teams.  Please re-consult if needed. Thanks, Ladona Mow, MSN, RN, GNP, [facility]  Pager# [phone]

## 2019-02-25 NOTE — Consult Note (Signed)
Reason for Consult:  Right foot ulcer Referring Physician:  Dr. Gladstone Lighter [name] is an 49 y.o. female.  HPI: The patient is a 49 year old female with a past medical history significant for diabetes.  She is admitted due to recent nausea and vomiting.  She has a long history of a right forefoot ulcer that has been treated with extensive wound care at the wound center.  She has had recurrence of the ulcer over the last few weeks.  Over the last few days she had increasing erythema and swelling at the toe extending to the forefoot.  She has a history of peripheral neuropathy.  She denies any injuries to the forefoot recently.  She is not a smoker.  She is on Flagyl, cefepime and vancomycin.  Past Medical History:  Diagnosis Date  . Anxiety   . Depression   . Diabetes mellitus without complication (HCC)   . High cholesterol   . Hypertension   . Neuropathy   . PCOS (polycystic ovarian syndrome)   . Type 1 diabetes [facility])     Past Surgical History:  Procedure Laterality Date  . ELBOW SURGERY    . INSERTION OF AHMED VALVE Right 09/07/2015   Procedure: INSERTION OF AHMED VALVE;  Surgeon: Edmon Crape, MD;  Location: [facility] OR;  Service: Ophthalmology;  Laterality: Right;  . LASER PHOTO ABLATION Right 09/07/2015   Procedure: LASER PHOTO ABLATION;  Surgeon: Edmon Crape, MD;  Location: [facility] OR;  Service: Ophthalmology;  Laterality: Right;  . PARS PLANA VITRECTOMY Right 09/07/2015   Procedure: PARS PLANA VITRECTOMY WITH 25 GAUGE,, with panretinal endolaser,;  Surgeon: Edmon Crape, MD;  Location: [facility] OR;  Service: Ophthalmology;  Laterality: Right;    Family History  Problem Relation Age of Onset  . Depression Mother   . Anxiety disorder Mother   . Diabetes Mother   . Diabetes Father   . Diabetes Brother     Social History:  reports that she quit smoking about 18 years ago. Her smoking use included cigarettes. She has never used smokeless tobacco. She reports previous alcohol use. She  reports current drug use. Drug: Marijuana.  Allergies:  Allergies  Allergen Reactions  . Tramadol Other (See Comments)    Unknown; pt claims she doesn't recall having this allergy     Medications: I have reviewed the patient's current medications.  Results for orders placed or performed during the hospital encounter of 02/25/19 (from the past 48 hour(s))  CBC with Differential     Status: Abnormal   Collection Time: 02/25/19  1:33 AM  Result Value Ref Range   WBC 16.2 (H) 4.0 - 10.5 K/uL   RBC 4.19 3.87 - 5.11 MIL/uL   Hemoglobin 12.5 12.0 - 15.0 g/dL   HCT 16.3 84.6 - 65.9 %   MCV 90.0 80.0 - 100.0 fL   MCH 29.8 26.0 - 34.0 pg   MCHC 33.2 30.0 - 36.0 g/dL   RDW 93.5 70.1 - 77.9 %   Platelets 370 150 - 400 K/uL   nRBC 0.0 0.0 - 0.2 %   Neutrophils Relative % 84 %   Neutro Abs 13.7 (H) 1.7 - 7.7 K/uL   Lymphocytes Relative 9 %   Lymphs Abs 1.4 0.7 - 4.0 K/uL   Monocytes Relative 4 %   Monocytes Absolute 0.7 0.1 - 1.0 K/uL   Eosinophils Relative 0 %   Eosinophils Absolute 0.1 0.0 - 0.5 K/uL   Basophils Relative 1 %   Basophils  Absolute 0.1 0.0 - 0.1 K/uL   Immature Granulocytes 2 %   Abs Immature Granulocytes 0.24 (H) 0.00 - 0.07 K/uL    Comment: Performed at [facility], [address] [place], [place] [address]  Comprehensive metabolic panel     Status: Abnormal   Collection Time: 02/25/19  1:33 AM  Result Value Ref Range   Sodium 138 135 - 145 mmol/L   Potassium 4.8 3.5 - 5.1 mmol/L   Chloride 103 98 - 111 mmol/L   CO2 21 (L) 22 - 32 mmol/L   Glucose, Bld 325 (H) 70 - 99 mg/dL   BUN 31 (H) 6 - 20 mg/dL   Creatinine, Ser 0.86 0.44 - 1.00 mg/dL   Calcium 8.8 (L) 8.9 - 10.3 mg/dL   Total Protein 7.5 6.5 - 8.1 g/dL   Albumin 3.2 (L) 3.5 - 5.0 g/dL   AST 12 (L) 15 - 41 U/L   ALT 12 0 - 44 U/L   Alkaline Phosphatase 60 38 - 126 U/L   Total Bilirubin 0.9 0.3 - 1.2 mg/dL   GFR calc non Af Amer >60 >60 mL/min   GFR calc Af Amer >60 >60 mL/min    Anion gap 14 5 - 15    Comment: Performed at [facility], [address], [place], [place] [address]  Lipase, blood     Status: None   Collection Time: 02/25/19  1:33 AM  Result Value Ref Range   Lipase 21 11 - 51 U/L    Comment: Performed at [facility], [address], [place], [place] [address]  Hemoglobin A1c     Status: Abnormal   Collection Time: 02/25/19  1:33 AM  Result Value Ref Range   Hgb A1c MFr Bld 9.3 (H) 4.8 - 5.6 %    Comment: (NOTE) Pre diabetes:          5.7%-6.4% Diabetes:              >6.4% Glycemic control for   <7.0% adults with diabetes    Mean Plasma Glucose 220.21 mg/dL    Comment: Performed at [facility], [address], [place], [place] [address]  Prealbumin     Status: None   Collection Time: 02/25/19  1:33 AM  Result Value Ref Range   Prealbumin 25.4 18 - 38 mg/dL    Comment: Performed at [facility], [address], [place], [place] [address]  CBG monitoring, ED     Status: Abnormal   Collection Time: 02/25/19  1:39 AM  Result Value Ref Range   Glucose-Capillary 303 (H) 70 - 99 mg/dL  POC beta hCG blood, ED     Status: None   Collection Time: 02/25/19  2:42 AM  Result Value Ref Range   I-stat hCG, quantitative <5.0 <5 mIU/mL   Comment 3            Comment:   GEST. AGE      CONC.  (mIU/mL)   <=1 WEEK        5 - 50     2 WEEKS       50 - 500     3 WEEKS       100 - 10,000     4 WEEKS     1,000 - 30,000        FEMALE AND NON-PREGNANT FEMALE:     LESS THAN 5 mIU/mL   Lactic acid, plasma  Status: None   Collection Time: 02/25/19  4:00 AM  Result Value Ref Range   Lactic Acid, Venous 1.9 0.5 - 1.9 mmol/L    Comment: Performed at [facility], [place] [address], [place], [place] [address]  SARS CORONAVIRUS 2 (TAT 6-24 HRS) Nasopharyngeal Nasopharyngeal Swab     Status: None   Collection Time: 02/25/19  4:01 AM   Specimen: Nasopharyngeal Swab  Result Value Ref  Range   SARS Coronavirus 2 NEGATIVE NEGATIVE    Comment: (NOTE) SARS-CoV-2 target nucleic acids are NOT DETECTED. The SARS-CoV-2 RNA is generally detectable in upper and lower respiratory specimens during the acute phase of infection. Negative results do not preclude SARS-CoV-2 infection, do not rule out co-infections with other pathogens, and should not be used as the sole basis for treatment or other patient management decisions. Negative results must be combined with clinical observations, patient history, and epidemiological information. The expected result is Negative. Fact Sheet for Patients: SugarRoll.be Fact Sheet for Healthcare Providers: https://www.woods-mathews.com/ This test is not yet approved or cleared by the [place] FDA and  has been authorized for detection and/or diagnosis of SARS-CoV-2 by FDA under an Emergency Use Authorization (EUA). This EUA will remain  in effect (meaning this test can be used) for the duration of the COVID-19 declaration under Section 56 4(b)(1) of the Act, 21 U.S.C. section 360bbb-3(b)(1), unless the authorization is terminated or revoked sooner. Performed at [place] [facility], [place] [address], [place], [place] [address]   MRSA PCR Screening     Status: None   Collection Time: 02/25/19  6:45 AM   Specimen: Nasal Mucosa; Nasopharyngeal  Result Value Ref Range   MRSA by PCR NEGATIVE NEGATIVE    Comment:        The GeneXpert MRSA Assay (FDA approved for NASAL specimens only), is one component of a comprehensive MRSA colonization surveillance program. It is not intended to diagnose MRSA infection nor to guide or monitor treatment for MRSA infections. Performed at [facility], [place] [address], [place], [place] [address]   Glucose, capillary     Status: Abnormal   Collection Time: 02/25/19  7:45 AM  Result Value Ref Range   Glucose-Capillary 237 (H) 70 - 99 mg/dL    Comment 1 Notify RN    Comment 2 Document in Chart   Glucose, capillary     Status: Abnormal   Collection Time: 02/25/19 11:47 AM  Result Value Ref Range   Glucose-Capillary 157 (H) 70 - 99 mg/dL    Comment: Glucose reference range applies only to samples taken after fasting for at least 8 hours.   Comment 1 Notify RN    Comment 2 Document in Chart   Glucose, capillary     Status: Abnormal   Collection Time: 02/25/19  3:59 PM  Result Value Ref Range   Glucose-Capillary 250 (H) 70 - 99 mg/dL    Comment: Glucose reference range applies only to samples taken after fasting for at least 8 hours.   Comment 1 Notify RN    Comment 2 Document in Chart     MR TOES RIGHT WO CONTRAST  Result Date: 02/25/2019 CLINICAL DATA:  Open wound on the great toe. Evaluate for osteomyelitis. EXAM: MRI OF THE RIGHT TOES WITHOUT CONTRAST TECHNIQUE: Multiplanar, multisequence MR imaging of the right forefoot was performed. No intravenous contrast was administered. COMPARISON:  None. FINDINGS: Bones/Joint/Cartilage Soft tissue wound along the plantar aspect of the great toe. Severe soft tissue  edema involving the great toe. Marrow edema in the first proximal and distal phalanx without definite cortical destruction which may reflect reactive edema secondary to inflammation versus osteomyelitis. No fracture or dislocation. Normal alignment. No joint effusion. Ligaments Collateral ligaments are intact.  Lisfranc ligament is intact. Muscles and Tendons Flexor, peroneal and extensor compartment tendons are intact. Mild T2 hyperintense signal throughout the plantar musculature likely neurogenic. Soft tissue No fluid collection or hematoma.  No soft tissue mass. IMPRESSION: 1. Soft tissue wound of the great toe with cellulitis. Marrow edema in the first proximal and distal phalanx without definite cortical destruction which may reflect reactive edema secondary to inflammation versus osteomyelitis. Electronically Signed   By: Elige Ko   On: 02/25/2019 11:59   DG Toe Great Right  Result Date: 02/25/2019 CLINICAL DATA:  Open wound right great toe EXAM: RIGHT GREAT TOE COMPARISON:  04/10/2018 FINDINGS: Frontal, oblique, lateral views of the right great toe demonstrate diffuse soft tissue swelling. No subcutaneous gas. There is no bony destruction or periosteal reaction to suggest osteomyelitis. IMPRESSION: 1. Soft tissue edema of the first digit. No acute or destructive bony lesions. Electronically Signed   By: Sharlet Salina M.D.   On: 02/25/2019 02:12   VAS [place] ABI WITH/WO TBI  Result Date: 02/25/2019 LOWER EXTREMITY DOPPLER STUDY Indications: Ulceration. High Risk Factors: Hypertension, Diabetes.  Performing Technologist: Olen Cordial RVT  Examination Guidelines: A complete evaluation includes at minimum, Doppler waveform signals and systolic blood pressure reading at the level of bilateral brachial, anterior tibial, and posterior tibial arteries, when vessel segments are accessible. Bilateral testing is considered an integral part of a complete examination. Photoelectric Plethysmograph (PPG) waveforms and toe systolic pressure readings are included as required and additional duplex testing as needed. Limited examinations for reoccurring indications may be performed as noted.  ABI Findings: +--------+------------------+-----+---------+--------+ Right   Rt Pressure (mmHg)IndexWaveform Comment  +--------+------------------+-----+---------+--------+ [id number]                    triphasic         +--------+------------------+-----+---------+--------+ PTA     212               1.54 biphasic          +--------+------------------+-----+---------+--------+ DP      140               1.01 biphasic          +--------+------------------+-----+---------+--------+ +---------+------------------+-----+---------+-------+ Left     Lt Pressure (mmHg)IndexWaveform Comment  +---------+------------------+-----+---------+-------+ Brachial 134                    triphasic        +---------+------------------+-----+---------+-------+ PTA      254               1.84 triphasic        +---------+------------------+-----+---------+-------+ DP       141               1.02 triphasic        +---------+------------------+-----+---------+-------+ Great Toe91                0.66                  +---------+------------------+-----+---------+-------+ +-------+-----------+-----------+------------+------------+ ABI/TBIToday's ABIToday's TBIPrevious ABIPrevious TBI +-------+-----------+-----------+------------+------------+ Right  1.54                                           +-------+-----------+-----------+------------+------------+  Left   1.02       0.66                                +-------+-----------+-----------+------------+------------+  Summary: Right: Resting right ankle-brachial index indicates noncompressible right lower extremity arteries. Unable to obtain TBI due to ulcerated great toe. ABI likely unreliable due to medial calcification. Left: Resting left ankle-brachial index is within normal range. No evidence of significant left lower extremity arterial disease. ABI likely unreliable due to medial calcification.  *See table(s) above for measurements and observations.  Electronically signed by Fabienne Bruns MD on 02/25/2019 at 11:54:48 AM.   Final     ROS: As above. PE:  Blood pressure (!) 113/57, pulse 86, temperature 98.8 F (37.1 C), temperature source Oral, resp. rate 16, height 5\' 4"  (1.626 m), weight 68.5 kg, last menstrual period 02/21/2019, SpO2 100 %. Well-nourished well-developed female no apparent distress.  Alert and oriented x4.  Mood and affect are normal.  Extraocular motions are intact.  Respirations are unlabored.  The right lower extremity has a superficial ulcer at the plantar medial hallux adjacent to the proximal  phalanx.  Diminished sensibility to light touch at the forefoot.  5 out of 5 strength in plantar and dorsiflexion of the toes.  No lymphadenopathy or lymphangitis.  No significant erythema, warmth, swelling or tenderness.  No exposed bone or tendon.  Assessment/Plan: Right hallux diabetic ulcer -based on the patient's history and physical exam findings I believe she has a superficial ulcer complicated by cellulitis.  The edema is evident in the proximal and distal phalanges is most likely the result of the superficial infection.  I do not believe there is evidence of osteomyelitis at this time.  There is no indication for surgery for amputation or debridement.  She will continue with her current wound care protocol.  I have emphasized the importance of blood sugar control.  She will follow-up with me on an as-needed basis.  I will sign off at this time.  Please call [phone] with any questions.  [phone] 02/25/2019, 9:05 PM
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
## 2019-02-25 NOTE — Progress Notes (Signed)
Pharmacy Antibiotic Note  [name] is a 49 y.o. female admitted on 02/25/2019 with sepsis.  Pharmacy has been consulted for Vancomycin, cefepime dosing.  Plan: Vancomycin 1.25gm iv x1, then Vancomycin 750 mg IV Q 12 hrs. Goal AUC 400-550. Expected AUC: 524 SCr used: 0.94  Cefepime 2gm iv x1, then 2gm iv q8hr   Height: 5\' 4"  (162.6 cm) Weight: 151 lb (68.5 kg) IBW/kg (Calculated) : 54.7  Temp (24hrs), Avg:98.9 F (37.2 C), Min:98.8 F (37.1 C), Max:99 F (37.2 C)  Recent Labs  Lab 02/25/19 0133 02/25/19 0400  WBC 16.2*  --   CREATININE 0.94  --   LATICACIDVEN  --  1.9    Estimated Creatinine Clearance: 71.1 mL/min (by C-G formula based on SCr of 0.94 mg/dL).    Allergies  Allergen Reactions  . Tramadol Other (See Comments)    Unknown; pt claims she doesn't recall having this allergy     Antimicrobials this admission: Vancomycin 02/25/2019 >> Cefepime 02/25/2019 >>   Dose adjustments this admission: -  Microbiology results: -  Thank you for allowing pharmacy to be a part of this patient's care.  02/27/2019 [place] 02/25/2019 6:19 AM

## 2019-02-25 NOTE — Progress Notes (Signed)
Patient seen and examined.  49 year old with diabetic foot ulcer, treated for last few months on and off with antibiotics presented to the ER with worsening right toe swelling, erythema and nausea vomiting probably related to gastroparesis and aggravated blood sugars.  Soft tissue x-rays shows intact bone.  No other localizing infections.  Admitted with IV fluids, broad-spectrum antibiotics.  Plan: Currently clinically stabilizing.  Continue broad-spectrum antibiotics today pending final culture results. MRI of the right foot/great toe, if there is any subcutaneous collection or bone infection, will talk to podiatry surgery for debridement.  If no underlying surgical need, probably can go home tomorrow with oral antibiotics and outpatient follow-up. Nausea vomiting improved, advance to regular diet.

## 2019-02-25 NOTE — H&P (Signed)
History and Physical    [name] ZWC:[id number] DOB: 08/31/1970 DOA: 02/25/2019  PCP: Lanae Boast, [place]   Patient coming from: Home   Chief Complaint: Nausea, vomiting, worsening toe infection   HPI: [name] is a 49 y.o. female with medical history significant for poorly controlled insulin-dependent diabetes mellitus with diabetic retinopathy and gastroparesis per patient report, hypertension, and bipolar disorder, now presenting to the emergency department with nausea, vomiting, and worsening in right great toe infection.  Patient reports 3 days of nausea, vomiting, expanding erythema around a chronic right toe ulcer, and chills.  Patient states that she had been on an oral antibiotic for the toe, but has been unable to take it recently due to nausea and vomiting.  She does not have sensation in the toes but reports some pain at the right midfoot. She has not been taking her insulin recently due to not eating.  She was having some epigastric discomfort which she attributes to gastroparesis.  She denies cough, shortness of breath, sore throat, chest pain, or diarrhea.  She reports similar infection to the right great toe a year or so ago that had improved with antibiotics.   ED Course: Upon arrival to the ED, patient is found to be afebrile, saturating well on room air, slightly tachycardic, and hypertensive.  Chemistry panel is notable for a glucose of 325 and BUN of 31.  CBC features a leukocytosis to 16,200.  Blood cultures were ordered in the emergency department and the patient was given a liter of saline, vancomycin, cefepime, and Flagyl.  Lactic acid and COVID-19 screening test are pending.  Review of Systems:  All other systems reviewed and apart from HPI, are negative.  Past Medical History:  Diagnosis Date  . Anxiety   . Depression   . Diabetes mellitus without complication ([place])   . High cholesterol   . Hypertension   . Neuropathy   . PCOS (polycystic ovarian  syndrome)   . Type 1 diabetes [facility])     Past Surgical History:  Procedure Laterality Date  . ELBOW SURGERY    . INSERTION OF AHMED VALVE Right 09/07/2015   Procedure: INSERTION OF AHMED VALVE;  Surgeon: Hurman Horn, MD;  Location: [place];  Service: Ophthalmology;  Laterality: Right;  . LASER PHOTO ABLATION Right 09/07/2015   Procedure: LASER PHOTO ABLATION;  Surgeon: Hurman Horn, MD;  Location: [place];  Service: Ophthalmology;  Laterality: Right;  . PARS PLANA VITRECTOMY Right 09/07/2015   Procedure: PARS PLANA VITRECTOMY WITH 25 GAUGE,, with panretinal endolaser,;  Surgeon: Hurman Horn, MD;  Location: [place];  Service: Ophthalmology;  Laterality: Right;     reports that she quit smoking about 18 years ago. Her smoking use included cigarettes. She has never used smokeless tobacco. She reports previous alcohol use. She reports current drug use. Drug: Marijuana.  Allergies  Allergen Reactions  . Tramadol Other (See Comments)    Unknown; pt claims she doesn't recall having this allergy     Family History  Problem Relation Age of Onset  . Depression Mother   . Anxiety disorder Mother   . Diabetes Mother   . Diabetes Father   . Diabetes Brother      Prior to Admission medications   Medication Sig Start Date End Date Taking? Authorizing Provider  ALPRAZolam Duanne Moron) 1 MG tablet Take 1 mg by mouth at bedtime as needed for anxiety.   Yes [provider]  amphetamine-dextroamphetamine (ADDERALL) 30 MG tablet  Take 30 mg by mouth daily.   Yes [provider]  aspirin EC 81 MG tablet Take 1 tablet (81 mg total) by mouth 2 (two) times daily. 06/03/18  Yes Mike Gip, FNP  atorvastatin (LIPITOR) 40 MG tablet Take 1 tablet (40 mg total) by mouth every evening. 06/03/18  Yes Mike Gip, FNP  doxycycline (VIBRA-TABS) 100 MG tablet Take 1 tablet (100 mg total) by mouth 2 (two) times daily. 02/19/19  Yes Kallie Locks, FNP  famotidine (PEPCID) 20 MG tablet Take 1 tablet (20  mg total) by mouth 2 (two) times daily. 12/04/18  Yes Massie Maroon, FNP  FLUoxetine (PROZAC) 20 MG capsule Take 20 mg by mouth daily.   Yes [provider]  gabapentin (NEURONTIN) 800 MG tablet TAKE 2 TABLETS BY MOUTH IN THE MORNING AND 3 TABLETS IN THE EVENING Patient taking differently: See admin instructions. TAKE 2 TABLETS BY MOUTH IN THE MORNING AND 3 TABLETS IN THE EVENING 11/22/18  Yes Massie Maroon, FNP  metFORMIN (GLUCOPHAGE) 1000 MG tablet Take 1 tablet (1,000 mg total) by mouth 2 (two) times daily with a meal. 11/19/18  Yes Massie Maroon, FNP  Multiple Vitamins-Minerals (MULTIVITAMIN GUMMIES ADULT PO) Take 1 capsule by mouth daily.   Yes [provider]  promethazine (PHENERGAN) 12.5 MG tablet Take 1 tablet (12.5 mg total) by mouth every 6 (six) hours as needed for nausea or vomiting. 12/04/18  Yes Massie Maroon, FNP  spironolactone (ALDACTONE) 100 MG tablet Take 1 tablet (100 mg total) by mouth daily. 11/19/18  Yes Massie Maroon, FNP  glipiZIDE (GLUCOTROL) 10 MG tablet Take 1 tablet (10 mg total) by mouth 2 (two) times daily before a meal. Patient not taking: Reported on 02/25/2019 02/19/19   Kallie Locks, FNP  glucose blood (ONE TOUCH ULTRA TEST) test strip 1 each by Other route 2 (two) times daily. And lancets 2/day. 06/03/18   Mike Gip, FNP  insulin aspart protamine- aspart (NOVOLOG MIX 70/30) (70-30) 100 UNIT/ML injection Inject 0.2 mLs (20 Units total) into the skin 2 (two) times daily with a meal. Patient not taking: Reported on 02/19/2019 11/19/18   Massie Maroon, FNP  Insulin Syringe-Needle U-100 (INSULIN SYRINGE .5CC/30GX1/2") 30G X 1/2" 0.5 ML MISC 1 each by Does not apply route every 12 (twelve) hours. Patient not taking: Reported on 02/19/2019 11/19/18   Massie Maroon, FNP    Physical Exam: Vitals:   02/25/19 0102 02/25/19 0104 02/25/19 0255  BP: (!) 148/79  (!) 161/103  Pulse: 88  100  Resp: 18  15  Temp: 98.8 F (37.1  C)    TempSrc: Oral    SpO2: 99%  97%  Weight:  68.5 kg   Height:  5\' 4"  (1.626 m)      Constitutional: NAD, calm  Eyes: PERTLA, lids and conjunctivae normal ENMT: Mucous membranes are moist. Posterior pharynx clear of any exudate or lesions.   Neck: normal, supple, no masses, no thyromegaly Respiratory: clear to auscultation bilaterally, no wheezing, no crackles. No accessory muscle use.  Cardiovascular: S1 & S2 heard, regular rate and rhythm. No extremity edema. No significant JVD. Abdomen: No distension, no tenderness, soft. Bowel sounds active.  Musculoskeletal: no clubbing / cyanosis. No joint deformity upper and lower extremities.   Skin: Thick callous involving plantar aspect of bilateral great toes. Right great toe with ulceration, surrounding erythema, and eschar. Warm, dry, well-perfused. Neurologic: No gross facial asymmetry. Sensation to light touch diminished in toes.  Moving all extremities.  Psychiatric: Alert and oriented x 3. Pleasant and cooperative.    Labs and Imaging on Admission: I have personally reviewed following labs and imaging studies  CBC: Recent Labs  Lab 02/25/19 0133  WBC 16.2*  NEUTROABS 13.7*  HGB 12.5  HCT 37.7  MCV 90.0  PLT 370   Basic Metabolic Panel: Recent Labs  Lab 02/25/19 0133  NA 138  K 4.8  CL 103  CO2 21*  GLUCOSE 325*  BUN 31*  CREATININE 0.94  CALCIUM 8.8*   GFR: Estimated Creatinine Clearance: 71.1 mL/min (by C-G formula based on SCr of 0.94 mg/dL). Liver Function Tests: Recent Labs  Lab 02/25/19 0133  AST 12*  ALT 12  ALKPHOS 60  BILITOT 0.9  PROT 7.5  ALBUMIN 3.2*   Recent Labs  Lab 02/25/19 0133  LIPASE 21   No results for input(s): AMMONIA in the last 168 hours. Coagulation Profile: No results for input(s): INR, PROTIME in the last 168 hours. Cardiac Enzymes: No results for input(s): CKTOTAL, CKMB, CKMBINDEX, TROPONINI in the last 168 hours. BNP (last 3 results) No results for input(s): PROBNP  in the last 8760 hours. HbA1C: No results for input(s): HGBA1C in the last 72 hours. CBG: Recent Labs  Lab 02/25/19 0139  GLUCAP 303*   Lipid Profile: No results for input(s): CHOL, HDL, LDLCALC, TRIG, CHOLHDL, LDLDIRECT in the last 72 hours. Thyroid Function Tests: No results for input(s): TSH, T4TOTAL, FREET4, T3FREE, THYROIDAB in the last 72 hours. Anemia Panel: No results for input(s): VITAMINB12, FOLATE, FERRITIN, TIBC, IRON, RETICCTPCT in the last 72 hours. Urine analysis:    Component Value Date/Time   COLORURINE YELLOW 06/07/2017 1226   APPEARANCEUR CLEAR 06/07/2017 1226   LABSPEC 1.032 (H) 06/07/2017 1226   PHURINE 5.0 06/07/2017 1226   GLUCOSEU >=500 (A) 06/07/2017 1226   HGBUR NEGATIVE 06/07/2017 1226   BILIRUBINUR small 02/19/2019 1151   KETONESUR 80 (A) 06/07/2017 1226   PROTEINUR Positive (A) 02/19/2019 1151   PROTEINUR 30 (A) 06/07/2017 1226   UROBILINOGEN 0.2 02/19/2019 1151   UROBILINOGEN 0.2 11/28/2013 0023   NITRITE Negative 02/19/2019 1151   NITRITE NEGATIVE 06/07/2017 1226   LEUKOCYTESUR Large (3+) (A) 02/19/2019 1151   Sepsis Labs: @LABRCNTIP (procalcitonin:4,lacticidven:4) ) Recent Results (from the past 240 hour(s))  Urine Culture     Status: Abnormal   Collection Time: 02/19/19  4:20 PM   Specimen: Urine   UR  Result Value Ref Range Status   Urine Culture, Routine Final report (A)  Final   Organism ID, Bacteria Escherichia coli (A)  Final    Comment: Greater than 100,000 colony forming units per mL Cefazolin <=4 ug/mL Cefazolin with an MIC <=16 predicts susceptibility to the oral agents cefaclor, cefdinir, cefpodoxime, cefprozil, cefuroxime, cephalexin, and loracarbef when used for therapy of uncomplicated urinary tract infections due to E. coli, Klebsiella pneumoniae, and Proteus mirabilis.    Antimicrobial Susceptibility Comment  Final    Comment:       ** S = Susceptible; I = Intermediate; R = Resistant **                    P =  Positive; N = Negative             MICS are expressed in micrograms per mL    Antibiotic                 RSLT#1    RSLT#2    RSLT#3  RSLT#4 Amoxicillin/Clavulanic Acid    S Ampicillin                     S Cefepime                       S Ceftriaxone                    S Cefuroxime                     S Ciprofloxacin                  S Ertapenem                      S Gentamicin                     S Imipenem                       S Levofloxacin                   S Meropenem                      S Nitrofurantoin                 S Piperacillin/Tazobactam        S Tetracycline                   S Tobramycin                     S Trimethoprim/Sulfa             S      Radiological Exams on Admission: DG Toe Great Right  Result Date: 02/25/2019 CLINICAL DATA:  Open wound right great toe EXAM: RIGHT GREAT TOE COMPARISON:  04/10/2018 FINDINGS: Frontal, oblique, lateral views of the right great toe demonstrate diffuse soft tissue swelling. No subcutaneous gas. There is no bony destruction or periosteal reaction to suggest osteomyelitis. IMPRESSION: 1. Soft tissue edema of the first digit. No acute or destructive bony lesions. Electronically Signed   By: Sharlet Salina M.D.   On: 02/25/2019 02:12    Assessment/Plan   1. Diabetic foot infection  - Presents with 3 days of subjective fevers and chills, expanding erythema surrounding a right 1st toe ulcer, and purulent drainage  - She has been unable to take her oral antibiotic recently d/t N/V  - Blood cultures ordered from ED and she was started on cefepime, vancomycin, and Flagyl  - Plain radiographs negative for bony involvement  - She has hx of pseudomonas, will continue current antibiotics, trend inflammatory markers, follow cultures and clinical response to treatment   2. Uncontrolled IDDM  - A1c was 9.1% earlier this month  - Continue CBG's and insulin    3. Hypertension  - BP elevated in ED  - Taking Aldactone at home but  reports being unable to take pills recently d/t N/V and will be given labetalol IVP as needed for now    4. Intractable N/V  - Abdominal exam benign, patient attributes this to gastroparesis  - Glycemic-control as above, as-needed antiemetics, maintenance IVF until tolerating diet, monitor electrolytes    5. Bipolar disorder  - She is established with psychiatry, denies any SI or hallucinations today  DVT prophylaxis: Lovenox  Code Status: Full  Family Communication: Discussed with patient  Disposition Plan: From home. Will likely return home once foot infection is improved and patient able to tolerate oral antibiotic.  Consults called: None  Admission status: Inpatient     Briscoe Deutscher, MD [facility] Pager: See [email or web address]  If 7AM-7PM, please contact the daytime attending [email or web address]  02/25/2019, 4:38 AM

## 2019-02-25 NOTE — Progress Notes (Signed)
ABI's have been completed. Preliminary results can be found in CV Proc through chart review.   02/25/19 9:32 AM Olen Cordial RVT

## 2019-02-25 NOTE — ED Triage Notes (Signed)
Pt presents to ER via EMS with vomiting x 1 day although this has been recurrent since November. Pt was given 400 ml ns and 4 mg of zofran by ems.

## 2019-02-25 NOTE — Progress Notes (Signed)
A consult was received from an ED physician for Vancomycin, cefepime per pharmacy dosing.  The patient's profile has been reviewed for ht/wt/allergies/indication/available labs.   A one time order has been placed for Vancomycin 1.25gm iv x1, and Cefepime 2gm iv x1.  Further antibiotics/pharmacy consults should be ordered by admitting physician if indicated.                       Thank you, Aleene Davidson Crowford 02/25/2019  4:16 AM

## 2019-02-26 DIAGNOSIS — F314 Bipolar disorder, current episode depressed, severe, without psychotic features: Secondary | ICD-10-CM

## 2019-02-26 DIAGNOSIS — R112 Nausea with vomiting, unspecified: Secondary | ICD-10-CM

## 2019-02-26 DIAGNOSIS — E1165 Type 2 diabetes mellitus with hyperglycemia: Secondary | ICD-10-CM

## 2019-02-26 DIAGNOSIS — I10 Essential (primary) hypertension: Secondary | ICD-10-CM

## 2019-02-26 LAB — CBC WITH DIFFERENTIAL/PLATELET
Abs Immature Granulocytes: 0.04 10*3/uL (ref 0.00–0.07)
Basophils Absolute: 0.1 10*3/uL (ref 0.0–0.1)
Basophils Relative: 1 %
Eosinophils Absolute: 0.7 10*3/uL — ABNORMAL HIGH (ref 0.0–0.5)
Eosinophils Relative: 7 %
HCT: 31.9 % — ABNORMAL LOW (ref 36.0–46.0)
Hemoglobin: 10.7 g/dL — ABNORMAL LOW (ref 12.0–15.0)
Immature Granulocytes: 0 %
Lymphocytes Relative: 26 %
Lymphs Abs: 2.7 10*3/uL (ref 0.7–4.0)
MCH: 30.4 pg (ref 26.0–34.0)
MCHC: 33.5 g/dL (ref 30.0–36.0)
MCV: 90.6 fL (ref 80.0–100.0)
Monocytes Absolute: 1 10*3/uL (ref 0.1–1.0)
Monocytes Relative: 9 %
Neutro Abs: 6 10*3/uL (ref 1.7–7.7)
Neutrophils Relative %: 57 %
Platelets: 272 10*3/uL (ref 150–400)
RBC: 3.52 MIL/uL — ABNORMAL LOW (ref 3.87–5.11)
RDW: 13 % (ref 11.5–15.5)
WBC: 10.5 10*3/uL (ref 4.0–10.5)
nRBC: 0 % (ref 0.0–0.2)

## 2019-02-26 LAB — COMPREHENSIVE METABOLIC PANEL
ALT: 11 U/L (ref 0–44)
AST: 11 U/L — ABNORMAL LOW (ref 15–41)
Albumin: 2.4 g/dL — ABNORMAL LOW (ref 3.5–5.0)
Alkaline Phosphatase: 46 U/L (ref 38–126)
Anion gap: 7 (ref 5–15)
BUN: 17 mg/dL (ref 6–20)
CO2: 22 mmol/L (ref 22–32)
Calcium: 8 mg/dL — ABNORMAL LOW (ref 8.9–10.3)
Chloride: 110 mmol/L (ref 98–111)
Creatinine, Ser: 0.57 mg/dL (ref 0.44–1.00)
GFR calc Af Amer: >60 mL/min (ref >60)
GFR calc non Af Amer: >60 mL/min (ref >60)
Glucose, Bld: 111 mg/dL — ABNORMAL HIGH (ref 70–99)
Potassium: 3.2 mmol/L — ABNORMAL LOW (ref 3.5–5.1)
Sodium: 139 mmol/L (ref 135–145)
Total Bilirubin: 0.6 mg/dL (ref 0.3–1.2)
Total Protein: 5.9 g/dL — ABNORMAL LOW (ref 6.5–8.1)

## 2019-02-26 LAB — GLUCOSE, CAPILLARY
Glucose-Capillary: 126 mg/dL — ABNORMAL HIGH (ref 70–99)
Glucose-Capillary: 136 mg/dL — ABNORMAL HIGH (ref 70–99)
Glucose-Capillary: 240 mg/dL — ABNORMAL HIGH (ref 70–99)
Glucose-Capillary: 82 mg/dL (ref 70–99)

## 2019-02-26 LAB — SEDIMENTATION RATE: Sed Rate: 68 mm/hr — ABNORMAL HIGH (ref 0–22)

## 2019-02-26 LAB — C-REACTIVE PROTEIN: CRP: <0.5 mg/dL (ref <1.0)

## 2019-02-26 LAB — HIV ANTIBODY (ROUTINE TESTING W REFLEX): HIV Screen 4th Generation wRfx: NONREACTIVE

## 2019-02-26 MED ORDER — POTASSIUM CHLORIDE CRYS ER 20 MEQ PO TBCR
40.0000 meq | EXTENDED_RELEASE_TABLET | Freq: Once | ORAL | Status: AC
  Administered 2019-02-26: 40 meq via ORAL
  Filled 2019-02-26: qty 2

## 2019-02-26 MED ORDER — ALUM & MAG HYDROXIDE-SIMETH 200-200-20 MG/5ML PO SUSP
30.0000 mL | Freq: Once | ORAL | Status: AC
  Administered 2019-02-26: 30 mL via ORAL
  Filled 2019-02-26: qty 30

## 2019-02-26 MED ORDER — LIDOCAINE VISCOUS HCL 2 % MT SOLN
15.0000 mL | Freq: Once | OROMUCOSAL | Status: AC
  Administered 2019-02-26: 15 mL via ORAL
  Filled 2019-02-26: qty 15

## 2019-02-26 MED ORDER — AMOXICILLIN-POT CLAVULANATE 875-125 MG PO TABS: 1.0000 | ORAL_TABLET | Freq: Two times a day (BID) | ORAL | 0 refills | Status: AC
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
## 2019-02-26 NOTE — Progress Notes (Signed)
IV d/c'd per MD order. Patient denied any complaints or concerns prior to d/c, education and instruction provided to patient. Patient departed unit in stable condition with Husband.

## 2019-02-26 NOTE — Discharge Summary (Signed)
Physician Discharge Summary  EMERLY PRAK OLM:[id number] DOB: April 27, 1970 DOA: 02/25/2019  PCP: Mike Gip, FNP  Admit date: 02/25/2019 Discharge date: 02/26/2019  Admitted From: Home  Discharge disposition: Home   Recommendations for Outpatient Follow-Up:   . Follow up with your primary care provider in one week.  . Check CBC, BMP in the next visit . Follow up with Dr Victorino Dike, orthopedics in 1-2 weeks   Discharge Diagnosis:   Principal Problem:   Diabetic foot infection (HCC) Active Problems:   Uncontrolled diabetes mellitus (HCC)   Bipolar affective disorder, depressed, severe (HCC)   Intractable nausea and vomiting   Hypertension   Discharge Condition: Improved.  Diet recommendation:  Carbohydrate-modified.   Wound care:  Continue wound- toe care at home  Code status: Full.   History of Present Illness:   [name] is a 49 y.o. female with medical history significant for poorly controlled insulin-dependent diabetes mellitus with diabetic retinopathy and gastroparesis per patient report, hypertension, and bipolar disorder, presented to the emergency department with nausea, vomiting, and worsening in right great toe infection. Patient stated that she had been on an oral antibiotic for the toe, but has been unable to take it recently due to nausea and vomiting.  She does not have sensation in the toes but reports some pain at the right midfoot. She has not been taking her insulin recently due to not eating.   Upon arrival to the ED, patient was found to be afebrile, saturating well on room air, slightly tachycardic, and hypertensive.  Chemistry panel is notable for a glucose of 325 and BUN of 31.  CBC featured a leukocytosis to 16,200.  Blood cultures were ordered in the emergency department and the patient was given a liter of saline, vancomycin, cefepime, and Flagyl.   Hospital Course:   Following conditions were addressed during hospitalization as listed  below,  Diabetic foot infection  Patient presenting with right first toe ulcer with cellulitis.  Seen by orthopedics.  No indication of osteomyelitis.  Spoke with Dr. Victorino Dike orthopedics.  He reviewed MRI of the foot.  It is likely to be reactive edema rather than osteomyelitis.  Patient will continue Augmentin on discharge.  Patient does have doxycycline at home. Blood cultures were negative in 1 day.   Uncontrolled IDDM  - Recent hemoglobin A1c was 9.1% .  Patient was continued on sliding scale insulin Accu-Cheks and long-acting insulin on hospital.  This will be continued on discharge.  Essential hypertension  Patient will resume Aldactone on discharge.   Intractable N/V  Improved.  Likely secondary to gastroparesis.  Antiemetic as needed  Bipolar disorder  Follows up with psychiatry as outpatient.  No active issues.  Disposition.  At this time, patient is stable for disposition home.  Patient was seen by orthopedics and recommended outpatient follow-up and oral antibiotics on discharge.  Patient will need continued wound care at home.  Medical Consultants:    Orthopedics  Procedures:    None Subjective:   Today, patient complains of mild burning sensation in her stomach but no nausea or vomiting.  Wants to go home.  Denies leg pain swelling.  Discharge Exam:   Vitals:   02/25/19 2143 02/26/19 0400  BP: 116/78 (!) 95/59  Pulse: 79 75  Resp: 18 18  Temp: 98.5 F (36.9 C) 98.3 F (36.8 C)  SpO2: 99% 100%   Vitals:   02/25/19 0618 02/25/19 1404 02/25/19 2143 02/26/19 0400  BP: (!) 129/107 (!) 113/57 116/78 Marland Kitchen)  95/59  Pulse: 93 86 79 75  Resp: 17 16 18 18   Temp: 99 F (37.2 C) 98.8 F (37.1 C) 98.5 F (36.9 C) 98.3 F (36.8 C)  TempSrc:  Oral Oral Oral  SpO2: 100% 100% 99% 100%  Weight:      Height:       Body mass index is 25.92 kg/m.   General: Alert awake, not in obvious distress HENT: pupils equally reacting to light,  No scleral pallor or icterus  noted. Oral mucosa is moist.  Legally blind Chest:  Clear breath sounds.  Diminished breath sounds bilaterally. No crackles or wheezes.  CVS: S1 &S2 heard. No murmur.  Regular rate and rhythm. Abdomen: Soft, nontender, nondistended.  Bowel sounds are heard.   Extremities: No cyanosis.  Peripheral pulses are palpable.  Callus over the toes.  Right great toe with ulceration and surrounding erythema covered with dressing.Marland Kitchen Psych: Alert, awake and oriented, normal mood CNS:  No cranial nerve deficits.  Power equal in all extremities.  Diminished sensation below the knee bilaterally. Skin: Warm and dry.  No rashes noted.  The results of significant diagnostics from this hospitalization (including imaging, microbiology, ancillary and laboratory) are listed below for reference.     Diagnostic Studies:   MR TOES RIGHT WO CONTRAST  Result Date: 02/25/2019 CLINICAL DATA:  Open wound on the great toe. Evaluate for osteomyelitis. EXAM: MRI OF THE RIGHT TOES WITHOUT CONTRAST TECHNIQUE: Multiplanar, multisequence MR imaging of the right forefoot was performed. No intravenous contrast was administered. COMPARISON:  None. FINDINGS: Bones/Joint/Cartilage Soft tissue wound along the plantar aspect of the great toe. Severe soft tissue edema involving the great toe. Marrow edema in the first proximal and distal phalanx without definite cortical destruction which may reflect reactive edema secondary to inflammation versus osteomyelitis. No fracture or dislocation. Normal alignment. No joint effusion. Ligaments Collateral ligaments are intact.  Lisfranc ligament is intact. Muscles and Tendons Flexor, peroneal and extensor compartment tendons are intact. Mild T2 hyperintense signal throughout the plantar musculature likely neurogenic. Soft tissue No fluid collection or hematoma.  No soft tissue mass. IMPRESSION: 1. Soft tissue wound of the great toe with cellulitis. Marrow edema in the first proximal and distal phalanx  without definite cortical destruction which may reflect reactive edema secondary to inflammation versus osteomyelitis. Electronically Signed   By: Kathreen Devoid   On: 02/25/2019 11:59   DG Toe Great Right  Result Date: 02/25/2019 CLINICAL DATA:  Open wound right great toe EXAM: RIGHT GREAT TOE COMPARISON:  04/10/2018 FINDINGS: Frontal, oblique, lateral views of the right great toe demonstrate diffuse soft tissue swelling. No subcutaneous gas. There is no bony destruction or periosteal reaction to suggest osteomyelitis. IMPRESSION: 1. Soft tissue edema of the first digit. No acute or destructive bony lesions. Electronically Signed   By: Randa Ngo M.D.   On: 02/25/2019 02:12   VAS [place] ABI WITH/WO TBI  Result Date: 02/25/2019 LOWER EXTREMITY DOPPLER STUDY Indications: Ulceration. High Risk Factors: Hypertension, Diabetes.  Performing Technologist: Carlos Levering RVT  Examination Guidelines: A complete evaluation includes at minimum, Doppler waveform signals and systolic blood pressure reading at the level of bilateral brachial, anterior tibial, and posterior tibial arteries, when vessel segments are accessible. Bilateral testing is considered an integral part of a complete examination. Photoelectric Plethysmograph (PPG) waveforms and toe systolic pressure readings are included as required and additional duplex testing as needed. Limited examinations for reoccurring indications may be performed as noted.  ABI Findings: +--------+------------------+-----+---------+--------+ Right  Rt Pressure (mmHg)IndexWaveform Comment  +--------+------------------+-----+---------+--------+ [id number]                    triphasic         +--------+------------------+-----+---------+--------+ PTA     212               1.54 biphasic          +--------+------------------+-----+---------+--------+ DP      140               1.01 biphasic          +--------+------------------+-----+---------+--------+  +---------+------------------+-----+---------+-------+ Left     Lt Pressure (mmHg)IndexWaveform Comment +---------+------------------+-----+---------+-------+ Brachial 134                    triphasic        +---------+------------------+-----+---------+-------+ PTA      254               1.84 triphasic        +---------+------------------+-----+---------+-------+ DP       141               1.02 triphasic        +---------+------------------+-----+---------+-------+ Great Toe91                0.66                  +---------+------------------+-----+---------+-------+ +-------+-----------+-----------+------------+------------+ ABI/TBIToday's ABIToday's TBIPrevious ABIPrevious TBI +-------+-----------+-----------+------------+------------+ Right  1.54                                           +-------+-----------+-----------+------------+------------+ Left   1.02       0.66                                +-------+-----------+-----------+------------+------------+  Summary: Right: Resting right ankle-brachial index indicates noncompressible right lower extremity arteries. Unable to obtain TBI due to ulcerated great toe. ABI likely unreliable due to medial calcification. Left: Resting left ankle-brachial index is within normal range. No evidence of significant left lower extremity arterial disease. ABI likely unreliable due to medial calcification.  *See table(s) above for measurements and observations.  Electronically signed by Fabienne Bruns MD on 02/25/2019 at 11:54:48 AM.   Final      Labs:   Basic Metabolic Panel: Recent Labs  Lab 02/25/19 0133 02/26/19 0554  NA 138 139  K 4.8 3.2*  CL 103 110  CO2 21* 22  GLUCOSE 325* 111*  BUN 31* 17  CREATININE 0.94 0.57  CALCIUM 8.8* 8.0*   GFR Estimated Creatinine Clearance: 83.5 mL/min (by C-G formula based on SCr of 0.57 mg/dL). Liver Function Tests: Recent Labs  Lab 02/25/19 0133 02/26/19 0554  AST  12* 11*  ALT 12 11  ALKPHOS 60 46  BILITOT 0.9 0.6  PROT 7.5 5.9*  ALBUMIN 3.2* 2.4*   Recent Labs  Lab 02/25/19 0133  LIPASE 21   No results for input(s): AMMONIA in the last 168 hours. Coagulation profile No results for input(s): INR, PROTIME in the last 168 hours.  CBC: Recent Labs  Lab 02/25/19 0133 02/26/19 0554  WBC 16.2* 10.5  NEUTROABS 13.7* 6.0  HGB 12.5 10.7*  HCT 37.7 31.9*  MCV 90.0 90.6  PLT 370 272   Cardiac Enzymes: No results for input(s): CKTOTAL, CKMB, CKMBINDEX,  TROPONINI in the last 168 hours. BNP: Invalid input(s): POCBNP CBG: Recent Labs  Lab 02/25/19 2140 02/25/19 2358 02/26/19 0403 02/26/19 0758 02/26/19 1134  GLUCAP 167* 136* 82 126* 240*   D-Dimer No results for input(s): DDIMER in the last 72 hours. Hgb A1c Recent Labs    02/25/19 0133  HGBA1C 9.3*   Lipid Profile No results for input(s): CHOL, HDL, LDLCALC, TRIG, CHOLHDL, LDLDIRECT in the last 72 hours. Thyroid function studies No results for input(s): TSH, T4TOTAL, T3FREE, THYROIDAB in the last 72 hours.  Invalid input(s): FREET3 Anemia work up No results for input(s): VITAMINB12, FOLATE, FERRITIN, TIBC, IRON, RETICCTPCT in the last 72 hours. Microbiology Recent Results (from the past 240 hour(s))  Urine Culture     Status: Abnormal   Collection Time: 02/19/19  4:20 PM   Specimen: Urine   UR  Result Value Ref Range Status   Urine Culture, Routine Final report (A)  Final   Organism ID, Bacteria Escherichia coli (A)  Final    Comment: Greater than 100,000 colony forming units per mL Cefazolin <=4 ug/mL Cefazolin with an MIC <=16 predicts susceptibility to the oral agents cefaclor, cefdinir, cefpodoxime, cefprozil, cefuroxime, cephalexin, and loracarbef when used for therapy of uncomplicated urinary tract infections due to E. coli, Klebsiella pneumoniae, and Proteus mirabilis.    Antimicrobial Susceptibility Comment  Final    Comment:       ** S = Susceptible; I =  Intermediate; R = Resistant **                    P = Positive; N = Negative             MICS are expressed in micrograms per mL    Antibiotic                 RSLT#1    RSLT#2    RSLT#3    RSLT#4 Amoxicillin/Clavulanic Acid    S Ampicillin                     S Cefepime                       S Ceftriaxone                    S Cefuroxime                     S Ciprofloxacin                  S Ertapenem                      S Gentamicin                     S Imipenem                       S Levofloxacin                   S Meropenem                      S Nitrofurantoin                 S Piperacillin/Tazobactam        S Tetracycline  S Tobramycin                     S Trimethoprim/Sulfa             S   SARS CORONAVIRUS 2 (TAT 6-24 HRS) Nasopharyngeal Nasopharyngeal Swab     Status: None   Collection Time: 02/25/19  4:01 AM   Specimen: Nasopharyngeal Swab  Result Value Ref Range Status   SARS Coronavirus 2 NEGATIVE NEGATIVE Final    Comment: (NOTE) SARS-CoV-2 target nucleic acids are NOT DETECTED. The SARS-CoV-2 RNA is generally detectable in upper and lower respiratory specimens during the acute phase of infection. Negative results do not preclude SARS-CoV-2 infection, do not rule out co-infections with other pathogens, and should not be used as the sole basis for treatment or other patient management decisions. Negative results must be combined with clinical observations, patient history, and epidemiological information. The expected result is Negative. Fact Sheet for Patients: HairSlick.no Fact Sheet for Healthcare Providers: quierodirigir.com This test is not yet approved or cleared by the [place] FDA and  has been authorized for detection and/or diagnosis of SARS-CoV-2 by FDA under an Emergency Use Authorization (EUA). This EUA will remain  in effect (meaning this test can be used) for the duration  of the COVID-19 declaration under Section 56 4(b)(1) of the Act, 21 U.S.C. section 360bbb-3(b)(1), unless the authorization is terminated or revoked sooner. Performed at [facility], [address], [place], [place] [address]   MRSA PCR Screening     Status: None   Collection Time: 02/25/19  6:45 AM   Specimen: Nasal Mucosa; Nasopharyngeal  Result Value Ref Range Status   MRSA by PCR NEGATIVE NEGATIVE Final    Comment:        The GeneXpert MRSA Assay (FDA approved for NASAL specimens only), is one component of a comprehensive MRSA colonization surveillance program. It is not intended to diagnose MRSA infection nor to guide or monitor treatment for MRSA infections. Performed at [facility], [address] [place], [place] [address]      Discharge Instructions:   Discharge Instructions    Call MD for:   Complete by: As directed    Worsening symptoms including fevers redness and drainage.   Diet Carb Modified   Complete by: As directed    Discharge instructions   Complete by: As directed    Follow-up with your primary care physician in 1 week.  Follow-up with orthopedics Dr. Victorino Dike, orthopedics in 1 to 2 weeks. Complete the course of antibiotics.  Seek medical attention for worsening symptoms.   Increase activity slowly   Complete by: As directed      Allergies as of 02/26/2019      Reactions   Tramadol Other (See Comments)   Unknown; pt claims she doesn't recall having this allergy      Medication List    TAKE these medications   ALPRAZolam 1 MG tablet Commonly known as: XANAX Take 1 mg by mouth at bedtime as needed for anxiety.   amoxicillin-clavulanate 875-125 MG tablet Commonly known as: Augmentin Take 1 tablet by mouth 2 (two) times daily for 7 days.   amphetamine-dextroamphetamine 30 MG tablet Commonly known as: ADDERALL Take 30 mg by mouth daily.   aspirin EC 81 MG tablet Take 1 tablet (81 mg total) by mouth 2 (two) times  daily.   atorvastatin 40 MG tablet Commonly known as: LIPITOR Take 1 tablet (40 mg total) by mouth  every evening.   doxycycline 100 MG tablet Commonly known as: VIBRA-TABS Take 1 tablet (100 mg total) by mouth 2 (two) times daily.   famotidine 20 MG tablet Commonly known as: PEPCID Take 1 tablet (20 mg total) by mouth 2 (two) times daily.   FLUoxetine 20 MG capsule Commonly known as: PROZAC Take 20 mg by mouth daily.   gabapentin 800 MG tablet Commonly known as: NEURONTIN TAKE 2 TABLETS BY MOUTH IN THE MORNING AND 3 TABLETS IN THE EVENING What changed: when to take this   glipiZIDE 10 MG tablet Commonly known as: GLUCOTROL Take 1 tablet (10 mg total) by mouth 2 (two) times daily before a meal.   glucose blood test strip Commonly known as: ONE TOUCH ULTRA TEST 1 each by Other route 2 (two) times daily. And lancets 2/day.   insulin aspart protamine- aspart (70-30) 100 UNIT/ML injection Commonly known as: NOVOLOG MIX 70/30 Inject 0.2 mLs (20 Units total) into the skin 2 (two) times daily with a meal.   INSULIN SYRINGE .5CC/30GX1/2" 30G X 1/2" 0.5 ML Misc 1 each by Does not apply route every 12 (twelve) hours.   metFORMIN 1000 MG tablet Commonly known as: GLUCOPHAGE Take 1 tablet (1,000 mg total) by mouth 2 (two) times daily with a meal.   MULTIVITAMIN GUMMIES ADULT PO Take 1 capsule by mouth daily.   promethazine 12.5 MG tablet Commonly known as: PHENERGAN Take 1 tablet (12.5 mg total) by mouth every 6 (six) hours as needed for nausea or vomiting.   spironolactone 100 MG tablet Commonly known as: Aldactone Take 1 tablet (100 mg total) by mouth daily.      Follow-up Information    Mike Gip, FNP. Schedule an appointment as soon as possible for a visit in 1 week(s).   Specialty: Family Medicine Why: regular follow-up and diabetes. Contact information: [address] [place] [address] [phone]        Toni Arthurs, MD. Schedule an  appointment as soon as possible for a visit in 1 week(s).   Specialty: Orthopedic Surgery Why: for your foot ulcer Contact information: [address] [place] [address] [phone]           Time coordinating discharge: 39 minutes  Signed:  Brexlee Heberlein  [facility] 02/26/2019, 12:54 PM

## 2019-02-26 NOTE — Progress Notes (Signed)
Wound care provided by RN

## 2019-03-02 LAB — CULTURE, BLOOD (ROUTINE X 2)
Culture: NO GROWTH
Culture: NO GROWTH
Special Requests: ADEQUATE
Special Requests: ADEQUATE

## 2019-03-15 ENCOUNTER — Other Ambulatory Visit: Payer: Self-pay | Admitting: Family Medicine

## 2019-03-15 DIAGNOSIS — I10 Essential (primary) hypertension: Secondary | ICD-10-CM

## 2019-03-27 ENCOUNTER — Ambulatory Visit: Payer: MEDICAID | Attending: Internal Medicine

## 2019-03-27 DIAGNOSIS — Z23 Encounter for immunization: Secondary | ICD-10-CM

## 2019-03-27 NOTE — Progress Notes (Signed)
   Covid-19 Vaccination Clinic  Name:  [name]    MRN: [id number] DOB: 1970-10-19  03/27/2019  [name] was observed post Covid-19 immunization for 15 minutes without incident. She was provided with Vaccine Information Sheet and instruction to access the V-Safe system.   [name] was instructed to call 911 with any severe reactions post vaccine: Marland Kitchen Difficulty breathing  . Swelling of face and throat  . A fast heartbeat  . A bad rash all over body  . Dizziness and weakness   Immunizations Administered    Name Date Dose VIS Date Route   Pfizer COVID-19 Vaccine 03/27/2019 11:42 AM 0.3 mL 12/13/2018 Intramuscular   Manufacturer: [employer], [employer]   Lot: [id number]   NDC: [address]-9

## 2019-04-21 ENCOUNTER — Ambulatory Visit: Payer: MEDICAID | Attending: Internal Medicine

## 2019-04-21 DIAGNOSIS — Z23 Encounter for immunization: Secondary | ICD-10-CM

## 2019-04-21 NOTE — Progress Notes (Signed)
   Covid-19 Vaccination Clinic  Name:  [name]    MRN: [id number] DOB: [birth year]  04/21/2019  [name] was observed post Covid-19 immunization for 15 minutes without incident. She was provided with Vaccine Information Sheet and instruction to access the V-Safe system.   [name] was instructed to call 911 with any severe reactions post vaccine: Marland Kitchen Difficulty breathing  . Swelling of face and throat  . A fast heartbeat  . A bad rash all over body  . Dizziness and weakness   Immunizations Administered    Name Date Dose VIS Date Route   Pfizer COVID-19 Vaccine 04/21/2019 11:08 AM 0.3 mL 02/26/2018 Intramuscular   Manufacturer: [employer], [employer]   Lot: [id number]   NDC: [address]-9

## 2019-04-28 ENCOUNTER — Other Ambulatory Visit: Payer: Self-pay | Admitting: Family Medicine

## 2019-04-28 DIAGNOSIS — K219 Gastro-esophageal reflux disease without esophagitis: Secondary | ICD-10-CM

## 2019-04-30 ENCOUNTER — Emergency Department (HOSPITAL_COMMUNITY): Payer: Medicaid Other

## 2019-04-30 ENCOUNTER — Emergency Department (HOSPITAL_COMMUNITY)
Admission: EM | Admit: 2019-04-30 | Discharge: 2019-04-30 | Disposition: A | Discharge status: Home / Self Care | Payer: Medicaid Other | Attending: Emergency Medicine | Admitting: Emergency Medicine

## 2019-04-30 ENCOUNTER — Other Ambulatory Visit: Payer: Self-pay

## 2019-04-30 ENCOUNTER — Encounter (HOSPITAL_COMMUNITY): Payer: Self-pay | Admitting: Emergency Medicine

## 2019-04-30 DIAGNOSIS — E109 Type 1 diabetes mellitus without complications: Secondary | ICD-10-CM | POA: Insufficient documentation

## 2019-04-30 DIAGNOSIS — L03115 Cellulitis of right lower limb: Secondary | ICD-10-CM | POA: Insufficient documentation

## 2019-04-30 DIAGNOSIS — F1290 Cannabis use, unspecified, uncomplicated: Secondary | ICD-10-CM | POA: Insufficient documentation

## 2019-04-30 DIAGNOSIS — M25474 Effusion, right foot: Secondary | ICD-10-CM | POA: Diagnosis present

## 2019-04-30 DIAGNOSIS — L0390 Cellulitis, unspecified: Secondary | ICD-10-CM

## 2019-04-30 DIAGNOSIS — Z87891 Personal history of nicotine dependence: Secondary | ICD-10-CM | POA: Diagnosis not present

## 2019-04-30 DIAGNOSIS — I10 Essential (primary) hypertension: Secondary | ICD-10-CM | POA: Insufficient documentation

## 2019-04-30 LAB — BASIC METABOLIC PANEL
Anion gap: 12 (ref 5–15)
BUN: 15 mg/dL (ref 6–20)
CO2: 23 mmol/L (ref 22–32)
Calcium: 8.6 mg/dL — ABNORMAL LOW (ref 8.9–10.3)
Chloride: 97 mmol/L — ABNORMAL LOW (ref 98–111)
Creatinine, Ser: 0.72 mg/dL (ref 0.44–1.00)
GFR calc Af Amer: >60 mL/min (ref >60)
GFR calc non Af Amer: >60 mL/min (ref >60)
Glucose, Bld: 406 mg/dL — ABNORMAL HIGH (ref 70–99)
Potassium: 4.3 mmol/L (ref 3.5–5.1)
Sodium: 132 mmol/L — ABNORMAL LOW (ref 135–145)

## 2019-04-30 LAB — CBC
HCT: 33.4 % — ABNORMAL LOW (ref 36.0–46.0)
Hemoglobin: 11 g/dL — ABNORMAL LOW (ref 12.0–15.0)
MCH: 29.2 pg (ref 26.0–34.0)
MCHC: 32.9 g/dL (ref 30.0–36.0)
MCV: 88.6 fL (ref 80.0–100.0)
Platelets: 382 10*3/uL (ref 150–400)
RBC: 3.77 MIL/uL — ABNORMAL LOW (ref 3.87–5.11)
RDW: 12.4 % (ref 11.5–15.5)
WBC: 11.8 10*3/uL — ABNORMAL HIGH (ref 4.0–10.5)
nRBC: 0 % (ref 0.0–0.2)

## 2019-04-30 LAB — I-STAT BETA HCG BLOOD, ED (MC, WL, AP ONLY): I-stat hCG, quantitative: <5 m[IU]/mL (ref <5)

## 2019-04-30 MED ORDER — SULFAMETHOXAZOLE-TRIMETHOPRIM 800-160 MG PO TABS: 1.0000 | ORAL_TABLET | Freq: Two times a day (BID) | ORAL | 0 refills | Status: AC

## 2019-04-30 MED ORDER — IOHEXOL 300 MG/ML  SOLN
100.0000 mL | Freq: Once | INTRAMUSCULAR | Status: AC | PRN
  Administered 2019-04-30: 100 mL via INTRAVENOUS

## 2019-04-30 MED ORDER — OXYCODONE HCL 5 MG PO TABS
5.0000 mg | ORAL_TABLET | Freq: Once | ORAL | Status: AC
  Administered 2019-04-30: 09:00:00 5 mg via ORAL
  Filled 2019-04-30: qty 1

## 2019-04-30 MED ORDER — CEPHALEXIN 500 MG PO CAPS: 500.0000 mg | ORAL_CAPSULE | Freq: Four times a day (QID) | ORAL | 0 refills | Status: AC

## 2019-04-30 NOTE — ED Triage Notes (Signed)
Pt reports pain and swelling to her R foot x1 week, denies any specific known precipitating event, but, states "I fall around the house a lot."

## 2019-04-30 NOTE — ED Provider Notes (Signed)
[facility]EM[facility]ENC[facility] DEPT [facility] Emergency Department Provider Note MRN:  [id number]  Arrival date & time: 04/30/19     Chief Complaint   Foot Injury   History of Present Illness   [name] is a 49 y.o. year-old female with a history of diabetes, blindness presenting to the ED with chief complaint of foot injury.  Patient has been having painful swelling to her right foot, ankle, lower leg for about 1 week. She does not recall a specific injury but she is blind and she explains that she bangs her feet on things often. She has a history of diabetes and has some neuropathy in her feet and has had multiple episodes of ulcers and has had osteomyelitis in the past. She denies fever, no pain to the chest or abdomen, no shortness of breath. Pain is constant, moderate to severe, worse with motion or palpation, trouble walking today.  Review of Systems  A complete 10 system review of systems was obtained and all systems are negative except as noted in the HPI and PMH.   Patient's Health History    Past Medical History:  Diagnosis Date  . Anxiety   . Depression   . Diabetes mellitus without complication (HCC)   . High cholesterol   . Hypertension   . Neuropathy   . PCOS (polycystic ovarian syndrome)   . Type 1 diabetes [facility])     Past Surgical History:  Procedure Laterality Date  . ELBOW SURGERY    . INSERTION OF AHMED VALVE Right 09/07/2015   Procedure: INSERTION OF AHMED VALVE;  Surgeon: Edmon Crape, MD;  Location: [facility] OR;  Service: Ophthalmology;  Laterality: Right;  . LASER PHOTO ABLATION Right 09/07/2015   Procedure: LASER PHOTO ABLATION;  Surgeon: Edmon Crape, MD;  Location: [facility] OR;  Service: Ophthalmology;  Laterality: Right;  . PARS PLANA VITRECTOMY Right 09/07/2015   Procedure: PARS PLANA VITRECTOMY WITH 25 GAUGE,, with panretinal endolaser,;  Surgeon: Edmon Crape, MD;  Location: [facility] OR;  Service: Ophthalmology;  Laterality: Right;    Family History  Problem Relation  Age of Onset  . Depression Mother   . Anxiety disorder Mother   . Diabetes Mother   . Diabetes Father   . Diabetes Brother     Social History   Socioeconomic History  . Marital status: Married    Spouse name: Not on file  . Number of children: Not on file  . Years of education: Not on file  . Highest education level: Not on file  Occupational History  . Not on file  Tobacco Use  . Smoking status: Former Smoker    Types: Cigarettes    Quit date: 09/12/2000    Years since quitting: 18.6  . Smokeless tobacco: Never Used  Substance and Sexual Activity  . Alcohol use: Not Currently    Comment: occasional  . Drug use: Yes    Types: Marijuana    Comment: daily   . Sexual activity: Yes    Birth control/protection: None, Condom  Other Topics Concern  . Not on file  Social History Narrative  . Not on file   Social Determinants of Health   Financial Resource Strain:   . Difficulty of Paying Living Expenses:   Food Insecurity:   . Worried About Programme researcher, broadcasting/film/video in the Last Year:   . Barista in the Last Year:   Transportation Needs:   . Freight forwarder (Medical):   Marland Kitchen Lack of  Transportation (Non-Medical):   Physical Activity:   . Days of Exercise per Week:   . Minutes of Exercise per Session:   Stress:   . Feeling of Stress :   Social Connections:   . Frequency of Communication with Friends and Family:   . Frequency of Social Gatherings with Friends and Family:   . Attends Religious Services:   . Active Member of Clubs or Organizations:   . Attends Banker Meetings:   Marland Kitchen Marital Status:   Intimate Partner Violence:   . Fear of Current or Ex-Partner:   . Emotionally Abused:   Marland Kitchen Physically Abused:   . Sexually Abused:      Physical Exam   Vitals:   04/30/19 0547 04/30/19 0916  BP: 138/76 138/71  Pulse: 84 79  Resp: 17 16  Temp: 97.6 F (36.4 C)   SpO2: 100% 100%    CONSTITUTIONAL: Well-appearing, NAD NEURO:  Alert and oriented  x 3, no focal deficits EYES:  eyes equal and reactive ENT/NECK:  no LAD, no JVD CARDIO: Regular rate, well-perfused, normal S1 and S2 PULM:  CTAB no wheezing or rhonchi GI/GU:  normal bowel sounds, non-distended, non-tender MSK/SPINE:  No gross deformities,  SKIN:  no rash, atraumatic; diffuse swelling to the right foot with tenderness to palpation to the right foot extending up to the proximal tib-fib region, healing ulcer to the right great toe PSYCH:  Appropriate speech and behavior  *Additional and/or pertinent findings included in MDM below  Diagnostic and Interventional Summary    EKG Interpretation  Date/Time:    Ventricular Rate:    PR Interval:    QRS Duration:   QT Interval:    QTC Calculation:   R Axis:     Text Interpretation:        Labs Reviewed  CBC - Abnormal; Notable for the following components:      Result Value   WBC 11.8 (*)    RBC 3.77 (*)    Hemoglobin 11.0 (*)    HCT 33.4 (*)    All other components within normal limits  BASIC METABOLIC PANEL - Abnormal; Notable for the following components:   Sodium 132 (*)    Chloride 97 (*)    Glucose, Bld 406 (*)    Calcium 8.6 (*)    All other components within normal limits  I-STAT BETA HCG BLOOD, ED ([facility], [facility], [facility] ONLY)    CT FOOT RIGHT W CONTRAST  Final Result    CT TIBIA FIBULA RIGHT W CONTRAST  Final Result    DG Foot Complete Right  Final Result      Medications  oxyCODONE (Oxy IR/ROXICODONE) immediate release tablet 5 mg (5 mg Oral Given 04/30/19 0905)  iohexol (OMNIPAQUE) 300 MG/ML solution 100 mL (100 mLs Intravenous Contrast Given 04/30/19 1133)     Procedures  /  Critical Care Procedures  ED Course and Medical Decision Making  I have reviewed the triage vital signs, the nursing notes, and pertinent available records from the EMR.  Listed above are laboratory and imaging tests that I personally ordered, reviewed, and interpreted and then considered in my medical decision making (see  below for details).      Concern for osteomyelitis in this type I diabetic with history of the same, obviously enlarged right foot with ulcer to the right great toe. Will obtain CT imaging to evaluate. Doubt DVT.  CT is consistent with cellulitis, no osteo-.  Limb is neurovascularly intact, appropriate for antibiotics  and close follow-up.  Barth Kirks. Sedonia Small, [place] [email or web address]  Final Clinical Impressions(s) / ED Diagnoses     ICD-10-CM   1. Cellulitis, unspecified cellulitis site  L03.90   2. Swelling of foot joint, right  M25.474 DG Foot Complete Right    DG Foot Complete Right    ED Discharge Orders         Ordered    cephALEXin (KEFLEX) 500 MG capsule  4 times daily     04/30/19 1344    sulfamethoxazole-trimethoprim (BACTRIM DS) 800-160 MG tablet  2 times daily     04/30/19 1344           Discharge Instructions Discussed with and Provided to Patient:     Discharge Instructions     You were evaluated in the Emergency Department and after careful evaluation, we did not find any emergent condition requiring admission or further testing in the hospital.  Your exam/testing today is overall reassuring.  Your symptoms seem to be due to infection of the skin, or cellulitis.  Please take the antibiotics as directed and follow-up with your regular doctors for recheck within the next week.  Please return to the Emergency Department if you experience any worsening of your condition.  We encourage you to follow up with a primary care provider.  Thank you for allowing [place] to be a part of your care.       Maudie Flakes, MD 04/30/19 1346

## 2019-04-30 NOTE — Discharge Instructions (Addendum)
You were evaluated in the Emergency Department and after careful evaluation, we did not find any emergent condition requiring admission or further testing in the hospital.  Your exam/testing today is overall reassuring.  Your symptoms seem to be due to infection of the skin, or cellulitis.  Please take the antibiotics as directed and follow-up with your regular doctors for recheck within the next week.  Please return to the Emergency Department if you experience any worsening of your condition.  We encourage you to follow up with a primary care provider.  Thank you for allowing [place] to be a part of your care.

## 2019-05-19 ENCOUNTER — Encounter: Payer: Self-pay | Admitting: Nurse Practitioner

## 2019-05-19 ENCOUNTER — Ambulatory Visit (INDEPENDENT_AMBULATORY_CARE_PROVIDER_SITE_OTHER): Payer: Self-pay | Admitting: Nurse Practitioner

## 2019-05-19 ENCOUNTER — Other Ambulatory Visit: Payer: Self-pay

## 2019-05-19 VITALS — BP 103/61 | HR 70 | Temp 98.4°F | Ht 64.0 in | Wt 164.4 lb

## 2019-05-19 DIAGNOSIS — M869 Osteomyelitis, unspecified: Secondary | ICD-10-CM

## 2019-05-19 DIAGNOSIS — E08319 Diabetes mellitus due to underlying condition with unspecified diabetic retinopathy without macular edema: Secondary | ICD-10-CM

## 2019-05-19 DIAGNOSIS — R208 Other disturbances of skin sensation: Secondary | ICD-10-CM

## 2019-05-19 DIAGNOSIS — E1140 Type 2 diabetes mellitus with diabetic neuropathy, unspecified: Secondary | ICD-10-CM

## 2019-05-19 DIAGNOSIS — L97501 Non-pressure chronic ulcer of other part of unspecified foot limited to breakdown of skin: Secondary | ICD-10-CM

## 2019-05-19 DIAGNOSIS — E0865 Diabetes mellitus due to underlying condition with hyperglycemia: Secondary | ICD-10-CM

## 2019-05-19 DIAGNOSIS — F4310 Post-traumatic stress disorder, unspecified: Secondary | ICD-10-CM

## 2019-05-19 DIAGNOSIS — F314 Bipolar disorder, current episode depressed, severe, without psychotic features: Secondary | ICD-10-CM

## 2019-05-19 DIAGNOSIS — E0842 Diabetes mellitus due to underlying condition with diabetic polyneuropathy: Secondary | ICD-10-CM

## 2019-05-19 DIAGNOSIS — E7800 Pure hypercholesterolemia, unspecified: Secondary | ICD-10-CM

## 2019-05-19 DIAGNOSIS — IMO0002 Reserved for concepts with insufficient information to code with codable children: Secondary | ICD-10-CM

## 2019-05-19 DIAGNOSIS — E119 Type 2 diabetes mellitus without complications: Secondary | ICD-10-CM

## 2019-05-19 DIAGNOSIS — Z9119 Patient's noncompliance with other medical treatment and regimen: Secondary | ICD-10-CM

## 2019-05-19 DIAGNOSIS — Z91199 Patient's noncompliance with other medical treatment and regimen due to unspecified reason: Secondary | ICD-10-CM

## 2019-05-19 DIAGNOSIS — E08621 Diabetes mellitus due to underlying condition with foot ulcer: Secondary | ICD-10-CM

## 2019-05-19 DIAGNOSIS — F4522 Body dysmorphic disorder: Secondary | ICD-10-CM

## 2019-05-19 LAB — POCT URINALYSIS DIPSTICK
Bilirubin, UA: NEGATIVE
Blood, UA: NEGATIVE
Glucose, UA: POSITIVE — AB
Ketones, UA: NEGATIVE
Leukocytes, UA: NEGATIVE
Nitrite, UA: NEGATIVE
Protein, UA: NEGATIVE
Spec Grav, UA: >=1.03 — AB (ref 1.010–1.025)
Urobilinogen, UA: 0.2 E.U./dL
pH, UA: 5.5 (ref 5.0–8.0)

## 2019-05-19 LAB — POCT GLYCOSYLATED HEMOGLOBIN (HGB A1C)
HbA1c POC (<> result, manual entry): 10.8 % (ref 4.0–5.6)
HbA1c, POC (controlled diabetic range): 10.8 % — AB (ref 0.0–7.0)
HbA1c, POC (prediabetic range): 10.8 % — AB (ref 5.7–6.4)
Hemoglobin A1C: 10.8 % — AB (ref 4.0–5.6)

## 2019-05-19 LAB — POCT GLUCOSE (DEVICE FOR HOME USE)
Glucose Fasting, POC: 337 mg/dL — AB (ref 70–99)
POC Glucose: 337 mg/dl — AB (ref 70–99)

## 2019-05-19 MED ORDER — FLUOXETINE HCL 40 MG PO CAPS: 80.0000 mg | ORAL_CAPSULE | Freq: Every morning | ORAL | 0 refills | Status: DC

## 2019-05-19 MED ORDER — GENTAMICIN SULFATE 0.1 % EX CREA: 1.0000 "application " | TOPICAL_CREAM | Freq: Three times a day (TID) | CUTANEOUS | 1 refills | Status: DC

## 2019-05-19 NOTE — Patient Instructions (Addendum)
Diabetes Basics  Diabetes (diabetes mellitus) is a long-term (chronic) disease. It occurs when the body does not properly use sugar (glucose) that is released from food after you eat. Diabetes may be caused by one or both of these problems:  Your pancreas does not make enough of a hormone called insulin.  Your body does not react in a normal way to insulin that it makes. Insulin lets sugars (glucose) go into cells in your body. This gives you energy. If you have diabetes, sugars cannot get into cells. This causes high blood sugar (hyperglycemia). Follow these instructions at home: How is diabetes treated? You may need to take insulin or other diabetes medicines daily to keep your blood sugar in balance. Take your diabetes medicines every day as told by your doctor. List your diabetes medicines here: Diabetes medicines  Name of medicine: ______________________________ ? Amount (dose): _______________ Time (a.m./p.m.): _______________ Notes: ___________________________________  Name of medicine: ______________________________ ? Amount (dose): _______________ Time (a.m./p.m.): _______________ Notes: ___________________________________  Name of medicine: ______________________________ ? Amount (dose): _______________ Time (a.m./p.m.): _______________ Notes: ___________________________________ If you use insulin, you will learn how to give yourself insulin by injection. You may need to adjust the amount based on the food that you eat. List the types of insulin you use here: Insulin  Insulin type: ______________________________ ? Amount (dose): _______________ Time (a.m./p.m.): _______________ Notes: ___________________________________  Insulin type: ______________________________ ? Amount (dose): _______________ Time (a.m./p.m.): _______________ Notes: ___________________________________  Insulin type: ______________________________ ? Amount (dose): _______________ Time (a.m./p.m.):  _______________ Notes: ___________________________________  Insulin type: ______________________________ ? Amount (dose): _______________ Time (a.m./p.m.): _______________ Notes: ___________________________________  Insulin type: ______________________________ ? Amount (dose): _______________ Time (a.m./p.m.): _______________ Notes: ___________________________________ How do I manage my blood sugar?  Check your blood sugar levels using a blood glucose monitor as directed by your doctor. Your doctor will set treatment goals for you. Generally, you should have these blood sugar levels:  Before meals (preprandial): 80-130 mg/dL (4.4-7.2 mmol/L).  After meals (postprandial): below 180 mg/dL (10 mmol/L).  A1c level: less than 7%. Write down the times that you will check your blood sugar levels: Blood sugar checks  Time: _______________ Notes: ___________________________________  Time: _______________ Notes: ___________________________________  Time: _______________ Notes: ___________________________________  Time: _______________ Notes: ___________________________________  Time: _______________ Notes: ___________________________________  Time: _______________ Notes: ___________________________________  What do I need to know about low blood sugar? Low blood sugar is called hypoglycemia. This is when blood sugar is at or below 70 mg/dL (3.9 mmol/L). Symptoms may include:  Feeling: ? Hungry. ? Worried or nervous (anxious). ? Sweaty and clammy. ? Confused. ? Dizzy. ? Sleepy. ? Sick to your stomach (nauseous).  Having: ? A fast heartbeat. ? A headache. ? A change in your vision. ? Tingling or no feeling (numbness) around the mouth, lips, or tongue. ? Jerky movements that you cannot control (seizure).  Having trouble with: ? Moving (coordination). ? Sleeping. ? Passing out (fainting). ? Getting upset easily (irritability). Treating low blood sugar To treat low blood  sugar, eat or drink something sugary right away. If you can think clearly and swallow safely, follow the 15:15 rule:  Take 15 grams of a fast-acting carb (carbohydrate). Talk with your doctor about how much you should take.  Some fast-acting carbs are: ? Sugar tablets (glucose pills). Take 3-4 glucose pills. ? 6-8 pieces of hard candy. ? 4-6 oz (120-150 mL) of fruit juice. ? 4-6 oz (120-150 mL) of regular (not diet) soda. ? 1 Tbsp (15 mL) honey or sugar.    Check your blood sugar 15 minutes after you take the carb.  If your blood sugar is still at or below 70 mg/dL (3.9 mmol/L), take 15 grams of a carb again.  If your blood sugar does not go above 70 mg/dL (3.9 mmol/L) after 3 tries, get help right away.  After your blood sugar goes back to normal, eat a meal or a snack within 1 hour. Treating very low blood sugar If your blood sugar is at or below 54 mg/dL (3 mmol/L), you have very low blood sugar (severe hypoglycemia). This is an emergency. Do not wait to see if the symptoms will go away. Get medical help right away. Call your local emergency services (911 in the [place] Do not drive yourself to the hospital. Questions to ask your health care provider  Do I need to meet with a diabetes educator?  What equipment will I need to care for myself at home?  What diabetes medicines do I need? When should I take them?  How often do I need to check my blood sugar?  What number can I call if I have questions?  When is my next doctor's visit?  Where can I find a support group for people with diabetes? Where to find more information  American Diabetes Association: www.diabetes.org  American Association of Diabetes Educators: www.diabeteseducator.org/patient-resources Contact a doctor if:  Your blood sugar is at or above 240 mg/dL (16.113.3 mmol/L) for 2 days in a row.  You have been sick or have had a fever for 2 days or more, and you are not getting better.  You have any of these  problems for more than 6 hours: ? You cannot eat or drink. ? You feel sick to your stomach (nauseous). ? You throw up (vomit). ? You have watery poop (diarrhea). Get help right away if:  Your blood sugar is lower than 54 mg/dL (3 mmol/L).  You get confused.  You have trouble: ? Thinking clearly. ? Breathing. Summary  Diabetes (diabetes mellitus) is a long-term (chronic) disease. It occurs when the body does not properly use sugar (glucose) that is released from food after digestion.  Take insulin and diabetes medicines as told.  Check your blood sugar every day, as often as told.  Keep all follow-up visits as told by your doctor. This is important. This information is not intended to replace advice given to you by your health care provider. Make sure you discuss any questions you have with your health care provider. Document Revised: 09/11/2018 Document Reviewed: 03/23/2017 Elsevier Patient Education  2020 [employer]  Diabetic Neuropathy Diabetic neuropathy refers to nerve damage that is caused by diabetes (diabetes mellitus). Over time, people with diabetes can develop nerve damage throughout the body. There are several types of diabetic neuropathy:  Peripheral neuropathy. This is the most common type of diabetic neuropathy. It causes damage to nerves that carry signals between the spinal cord and other parts of the body (peripheral nerves). This usually affects nerves in the feet and legs first, and may eventually affect the hands and arms. The damage affects the ability to sense touch or temperature.  Autonomic neuropathy. This type causes damage to nerves that control involuntary functions (autonomic nerves). These nerves carry signals that control: ? Heartbeat. ? Body temperature. ? Blood pressure. ? Urination. ? Digestion. ? Sweating. ? Sexual function. ? Response to changing blood sugar (glucose) levels.  Focal neuropathy. This type of nerve damage affects one  area of the body, such as an  arm, a leg, or the face. The injury may involve one nerve or a small group of nerves. Focal neuropathy can be painful and unpredictable, and occurs most often in older adults with diabetes. This often develops suddenly, but usually improves over time and does not cause long-term problems.  Proximal neuropathy. This type of nerve damage affects the nerves of the thighs, hips, buttocks, or legs. It causes severe pain, weakness, and muscle death (atrophy), usually in the thigh muscles. It is more common among older men and people who have type 2 diabetes. The length of recovery time may vary. What are the causes? Peripheral, autonomic, and focal neuropathies are caused by diabetes that is not well controlled with treatment. The cause of proximal neuropathy is not known, but it may be caused by inflammation related to uncontrolled blood glucose levels. What are the signs or symptoms? Peripheral neuropathy Peripheral neuropathy develops slowly over time. When the nerves of the feet and legs no longer work, you may experience:  Burning, stabbing, or aching pain in the legs or feet.  Pain or cramping in the legs or feet.  Loss of feeling (numbness) and inability to feel pressure or pain in the feet. This can lead to: ? Thick calluses or sores on areas of constant pressure. ? Ulcers. ? Reduced ability to feel temperature changes.  Foot deformities.  Muscle weakness.  Loss of balance or coordination. Autonomic neuropathy The symptoms of autonomic neuropathy vary depending on which nerves are affected. Symptoms may include:  Problems with digestion, such as: ? Nausea or vomiting. ? Poor appetite. ? Bloating. ? Diarrhea or constipation. ? Trouble swallowing. ? Losing weight without trying to.  Problems with the heart, blood and lungs, such as: ? Dizziness, especially when standing up. ? Fainting. ? Shortness of breath. ? Irregular heartbeat.  Bladder  problems, such as: ? Trouble starting or stopping urination. ? Leaking urine. ? Trouble emptying the bladder. ? Urinary tract infections (UTIs).  Problems with other body functions, such as: ? Sweat. You may sweat too much or too little. ? Temperature. You might get hot easily. Or, you might feel cold more than usual. ? Sexual function. Men may not be able to get or maintain an erection. Women may have vaginal dryness and difficulty with arousal. Focal neuropathy Symptoms affect only one area of the body. Common symptoms include:  Numbness.  Tingling.  Burning pain.  Prickling feeling.  Very sensitive skin.  Weakness.  Inability to move (paralysis).  Muscle twitching.  Muscles getting smaller (wasting).  Poor coordination.  Double or blurred vision. Proximal neuropathy  Sudden, severe pain in the hip, thigh, or buttocks. Pain may spread from the back into the legs (sciatica).  Pain and numbness in the arms and legs.  Tingling.  Loss of bladder control or bowel control.  Weakness and wasting of thigh muscles.  Difficulty getting up from a seated position.  Abdominal swelling.  Unexplained weight loss. How is this diagnosed? Diagnosis usually involves reviewing your medical history and any symptoms you have. Diagnosis varies depending on the type of neuropathy your health care provider suspects. Peripheral neuropathy Your health care provider will check areas that are affected by your nervous system (neurologic exam), such as your reflexes, how you move, and what you can feel. You may have other tests, such as:  Blood tests.  Removal and examination of fluid that surrounds the spinal cord (lumbar puncture).  CT scan.  MRI.  A test to check the nerves that  control muscles (electromyogram, EMG).  Tests of how quickly messages pass through your nerves (nerve conduction velocity tests).  Removal of a small piece of nerve to be examined under a microscope  (biopsy). Autonomic neuropathy You may have tests, such as:  Tests to measure your blood pressure and heart rate. This may include monitoring you while you are safely secured to an exam table that moves you from a lying position to an upright position (table tilt test).  Breathing tests to check your lungs.  Tests to check how food moves through the digestive system (gastric emptying tests).  Blood, sweat, or urine tests.  Ultrasound of your bladder.  Spinal fluid tests. Focal neuropathy This condition may be diagnosed with:  A neurologic exam.  CT scan.  MRI.  EMG.  Nerve conduction velocity tests. Proximal neuropathy There is no test to diagnose this type of neuropathy. You may have tests to rule out other possible causes of this type of neuropathy. Tests may include:  X-rays of your spine and lumbar region.  Lumbar puncture.  MRI. How is this treated? The goal of treatment is to keep nerve damage from getting worse. The most important part of treatment is keeping your blood glucose level and your A1C level within your target range by following your diabetes management plan. Over time, maintaining lower blood glucose levels helps lessen symptoms. In some cases, you may need prescription pain medicine. Follow these instructions at home:  Lifestyle   Do not use any products that contain nicotine or tobacco, such as cigarettes and e-cigarettes. If you need help quitting, ask your health care provider.  Be physically active every day. Include strength training and balance exercises.  Follow a healthy meal plan.  Work with your health care provider to manage your blood pressure. General instructions  Follow your diabetes management plan as directed. ? Check your blood glucose levels as directed by your health care provider. ? Keep your blood glucose in your target range as directed by your health care provider. ? Have your A1C level checked at least two times a  year, or as often as told by your health care provider.  Take over the counter and prescription medicines only as told by your health care provider. This includes insulin and diabetes medicine.  Do not drive or use heavy machinery while taking prescription pain medicines.  Check your skin and feet every day for cuts, bruises, redness, blisters, or sores.  Keep all follow up visits as told by your health care provider. This is important. Contact a health care provider if:  You have burning, stabbing, or aching pain in your legs or feet.  You are unable to feel pressure or pain in your feet.  You develop problems with digestion, such as: ? Nausea. ? Vomiting. ? Bloating. ? Constipation. ? Diarrhea. ? Abdominal pain.  You have difficulty with urination, such as inability: ? To control when you urinate (incontinence). ? To completely empty the bladder (retention).  You have palpitations.  You feel dizzy, weak, or faint when you stand up. Get help right away if:  You cannot urinate.  You have sudden weakness or loss of coordination.  You have trouble speaking.  You have pain or pressure in your chest.  You have an irregular heart beat.  You have sudden inability to move a part of your body. Summary  Diabetic neuropathy refers to nerve damage that is caused by diabetes. It can affect nerves throughout the entire body, causing numbness  and pain in the arms, legs, digestive tract, heart, and other body systems.  Keep your blood glucose level and your blood pressure in your target range, as directed by your health care provider. This can help prevent neuropathy from getting worse.  Check your skin and feet every day for cuts, bruises, redness, blisters, or sores.  Do not use any products that contain nicotine or tobacco, such as cigarettes and e-cigarettes. If you need help quitting, ask your health care provider. This information is not intended to replace advice given to  you by your health care provider. Make sure you discuss any questions you have with your health care provider. Document Revised: 01/31/2017 Document Reviewed: 01/24/2016 Elsevier Patient Education  2020 [employer].  Gastroparesis  Gastroparesis is a condition in which food takes longer than normal to empty from the stomach. The condition is usually long-lasting (chronic). It may also be called delayed gastric emptying. There is no cure, but there are treatments and things that you can do at home to help relieve symptoms. Treating the underlying condition that causes gastroparesis can also help relieve symptoms. What are the causes? In many cases, the cause of this condition is not known. Possible causes include:  A hormone (endocrine) disorder, such as hypothyroidism or diabetes.  A nervous system disease, such as Parkinson's disease or multiple sclerosis.  Cancer, infection, or surgery that affects the stomach or vagus nerve. The vagus nerve runs from your chest, through your neck, to the lower part of your brain.  A connective tissue disorder, such as scleroderma.  Certain medicines. What increases the risk? You are more likely to develop this condition if you:  Have certain disorders or diseases, including: ? An endocrine disorder. ? An eating disorder. ? Amyloidosis. ? Scleroderma. ? Parkinson's disease. ? Multiple sclerosis. ? Cancer or infection of the stomach or the vagus nerve.  Have had surgery on the stomach or vagus nerve.  Take certain medicines.  Are female. What are the signs or symptoms? Symptoms of this condition include:  Feeling full after eating very little.  Nausea.  Vomiting.  Heartburn.  Abdominal bloating.  Inconsistent blood sugar (glucose) levels on blood tests.  Lack of appetite.  Weight loss.  Acid from the stomach coming up into the esophagus (gastroesophageal reflux).  Sudden tightening (spasm) of the stomach, which can be  painful. Symptoms may come and go. Some people may not notice any symptoms. How is this diagnosed? This condition is diagnosed with tests, such as:  Tests that check how long it takes food to move through the stomach and intestines. These tests include: ? Upper gastrointestinal (GI) series. For this test, you drink a liquid that shows up well on X-rays, and then X-rays will be taken of your intestines. ? Gastric emptying scintigraphy. For this test, you eat food that contains a small amount of radioactive material, and then scans are taken. ? Wireless capsule GI monitoring system. For this test, you swallow a pill (capsule) that records information about how foods and fluid move through your stomach.  Gastric manometry. For this test, a tube is passed down your throat and into your stomach to measure electrical and muscular activity.  Endoscopy. For this test, a long, thin tube is passed down your throat and into your stomach to check for problems in your stomach lining.  Ultrasound. This test uses sound waves to create images of inside the body. This can help rule out gallbladder disease or pancreatitis as a cause of  your symptoms. How is this treated? There is no cure for gastroparesis. Treatment may include:  Treating the underlying cause.  Managing your symptoms by making changes to your diet and exercise habits.  Taking medicines to control nausea and vomiting and to stimulate stomach muscles.  Getting food through a feeding tube in the hospital. This may be done in severe cases.  Having surgery to insert a device into your body that helps improve stomach emptying and control nausea and vomiting (gastric neurostimulator). Follow these instructions at home:  Take over-the-counter and prescription medicines only as told by your health care provider.  Follow instructions from your health care provider about eating or drinking restrictions. Your health care provider may recommend that  you: ? Eat smaller meals more often. ? Eat low-fat foods. ? Eat low-fiber forms of high-fiber foods. For example, eat cooked vegetables instead of raw vegetables. ? Have only liquid foods instead of solid foods. Liquid foods are easier to digest.  Drink enough fluid to keep your urine pale yellow.  Exercise as often as told by your health care provider.  Keep all follow-up visits as told by your health care provider. This is important. Contact a health care provider if you:  Notice that your symptoms do not improve with treatment.  Have new symptoms. Get help right away if you:  Have severe abdominal pain that does not improve with treatment.  Have nausea that is severe or does not go away.  Cannot drink fluids without vomiting. Summary  Gastroparesis is a chronic condition in which food takes longer than normal to empty from the stomach.  Symptoms include nausea, vomiting, heartburn, abdominal bloating, and loss of appetite.  Eating smaller portions, and low-fat, low-fiber foods may help you manage your symptoms.  Get help right away if you have severe abdominal pain. This information is not intended to replace advice given to you by your health care provider. Make sure you discuss any questions you have with your health care provider. Document Revised: 03/19/2017 Document Reviewed: 10/24/2016 Elsevier Patient Education  2020 [employer].

## 2019-05-19 NOTE — Progress Notes (Signed)
[name], [place]  [address] Phone:  [phone]   Fax:  [phone]   Established Patient Office Visit  Subjective:  Patient ID: [name], female    DOB: 18-Jun-1970  Age: 49 y.o. MRN: [id number]  CC:  Chief Complaint  Patient presents with  . Follow-up    ears feeling icthy, renewal of cream for ulcer on toe , gentamicin    HPI [name] presents for follow up. She is into day with her husband. She  has a past medical history of Anxiety, Depression, Diabetes mellitus without complication ([place]), High cholesterol, Hypertension, Neuropathy, PCOS (polycystic ovarian syndrome), and Type 1 diabetes ([place]).   Diabetes Mellitus Patient presents for follow up of diabetes. Current symptoms include: foot ulcerations, hyperglycemia, nausea, paresthesia of the feet, visual disturbances, vomitting and weight loss. Symptoms have gradually worsened. Patient denies hypoglycemia . Evaluation to date has included: fasting blood sugar, fasting lipid panel, hemoglobin A1C and microalbuminuria.  Home sugars: High > 300 at all times. Current treatment: Discontinued insulin which has been discontinued because of side effects. She states that she has a love hate relationship with insulin. She does not use her insulin because she does not want to gain weight. She states " She would rather die skinny than use insulin and gaine weight." She has lost 40 pounds since December and she is not eating because of gastroparesis.  She verbalizes that she is aware why she is having this. Continued sulfonylurea which has been not very effective and Continued metformin which has been not very effective. Last dilated eye exam: 2 years ago.  Past Medical History:  Diagnosis Date  . Anxiety   . Depression   . Diabetes mellitus without complication ([place])   . High cholesterol   . Hypertension   . Neuropathy   . PCOS (polycystic ovarian syndrome)   . Type 1 diabetes  [facility])     Past Surgical History:  Procedure Laterality Date  . ELBOW SURGERY    . INSERTION OF AHMED VALVE Right 09/07/2015   Procedure: INSERTION OF AHMED VALVE;  Surgeon: Hurman Horn, MD;  Location: [place];  Service: Ophthalmology;  Laterality: Right;  . LASER PHOTO ABLATION Right 09/07/2015   Procedure: LASER PHOTO ABLATION;  Surgeon: Hurman Horn, MD;  Location: [place];  Service: Ophthalmology;  Laterality: Right;  . PARS PLANA VITRECTOMY Right 09/07/2015   Procedure: PARS PLANA VITRECTOMY WITH 25 GAUGE,, with panretinal endolaser,;  Surgeon: Hurman Horn, MD;  Location: [place];  Service: Ophthalmology;  Laterality: Right;    Family History  Problem Relation Age of Onset  . Depression Mother   . Anxiety disorder Mother   . Diabetes Mother   . Diabetes Father   . Diabetes Brother     Social History   Socioeconomic History  . Marital status: Married    Spouse name: Not on file  . Number of children: Not on file  . Years of education: Not on file  . Highest education level: Not on file  Occupational History  . Not on file  Tobacco Use  . Smoking status: Former Smoker    Types: Cigarettes    Quit date: 09/12/2000    Years since quitting: 18.6  . Smokeless tobacco: Never Used  Substance and Sexual Activity  . Alcohol use: Not Currently    Comment: occasional  . Drug use: Yes    Types: Marijuana    Comment:  daily   . Sexual activity: Yes    Birth control/protection: None, Condom  Other Topics Concern  . Not on file  Social History Narrative  . Not on file   Social Determinants of Health   Financial Resource Strain:   . Difficulty of Paying Living Expenses:   Food Insecurity:   . Worried About Programme researcher, broadcasting/film/video in the Last Year:   . Barista in the Last Year:   Transportation Needs:   . Freight forwarder (Medical):   Marland Kitchen Lack of Transportation (Non-Medical):   Physical Activity:   . Days of Exercise per Week:   . Minutes of Exercise per Session:    Stress:   . Feeling of Stress :   Social Connections:   . Frequency of Communication with Friends and Family:   . Frequency of Social Gatherings with Friends and Family:   . Attends Religious Services:   . Active Member of Clubs or Organizations:   . Attends Banker Meetings:   Marland Kitchen Marital Status:   Intimate Partner Violence:   . Fear of Current or Ex-Partner:   . Emotionally Abused:   Marland Kitchen Physically Abused:   . Sexually Abused:     Outpatient Medications Prior to Visit  Medication Sig Dispense Refill  . ALPRAZolam (XANAX) 1 MG tablet Take 1 mg by mouth at bedtime as needed for anxiety.    Marland Kitchen amphetamine-dextroamphetamine (ADDERALL) 30 MG tablet Take 30 mg by mouth daily.    . Artificial Tear Ointment (DRY EYES OP) Apply 1-2 drops to eye daily as needed (drye eyes).    Marland Kitchen aspirin EC 81 MG tablet Take 1 tablet (81 mg total) by mouth 2 (two) times daily. 60 tablet 2  . atorvastatin (LIPITOR) 40 MG tablet Take 1 tablet (40 mg total) by mouth every evening. (Patient taking differently: Take 40 mg by mouth every morning. ) 90 tablet 2  . gabapentin (NEURONTIN) 800 MG tablet TAKE 2 TABLETS BY MOUTH IN THE MORNING AND 3 TABLETS IN THE EVENING (Patient taking differently: Take 1,600-2,400 mg by mouth See admin instructions. TAKE 2 TABLETS (1600 mg totally) BY MOUTH IN THE MORNING AND 3 TABLETS (2400 mg totally) by mouth IN THE EVENING) 150 tablet 3  . glucose blood (ONE TOUCH ULTRA TEST) test strip 1 each by Other route 2 (two) times daily. And lancets 2/day. 100 each 0  . insulin aspart protamine- aspart (NOVOLOG MIX 70/30) (70-30) 100 UNIT/ML injection Inject 0.2 mLs (20 Units total) into the skin 2 (two) times daily with a meal. (Patient taking differently: Inject into the skin See admin instructions. Per sliding scale as needed) 30 mL 5  . Insulin Syringe-Needle U-100 (INSULIN SYRINGE .5CC/30GX1/2") 30G X 1/2" 0.5 ML MISC 1 each by Does not apply route every 12 (twelve) hours. 100 each  11  . metFORMIN (GLUCOPHAGE) 1000 MG tablet Take 1 tablet (1,000 mg total) by mouth 2 (two) times daily with a meal. 180 tablet 1  . Multiple Vitamins-Minerals (MULTIVITAMIN GUMMIES ADULT PO) Take 1 capsule by mouth daily.    Marland Kitchen spironolactone (ALDACTONE) 100 MG tablet Take 1 tablet by mouth once daily (Patient taking differently: Take 100 mg by mouth every morning. ) 90 tablet 0  . FLUoxetine (PROZAC) 40 MG capsule Take 80 mg by mouth every morning.    . famotidine (PEPCID) 20 MG tablet Take 1 tablet by mouth twice daily 30 tablet 0  . glipiZIDE (GLUCOTROL) 10 MG tablet Take 1  tablet (10 mg total) by mouth 2 (two) times daily before a meal. (Patient not taking: Reported on 02/25/2019) 60 tablet 3  . promethazine (PHENERGAN) 12.5 MG tablet Take 1 tablet (12.5 mg total) by mouth every 6 (six) hours as needed for nausea or vomiting. (Patient not taking: Reported on 04/30/2019) 30 tablet 0   No facility-administered medications prior to visit.    Allergies  Allergen Reactions  . Tramadol Other (See Comments)    Unknown; pt claims she doesn't recall having this allergy     ROS Review of Systems  Constitutional: Positive for appetite change and unexpected weight change.  HENT:       Ear irritation and itch  Eyes: Positive for visual disturbance.       Decreased vision bilaterally "nearly blind"  Respiratory: Negative.   Cardiovascular: Negative.   Gastrointestinal: Positive for constipation, nausea and vomiting.       Known gastroparesis  Neurological: Positive for numbness.       Hands and feet       Objective:    Physical Exam  Constitutional: She is oriented to person, place, and time. She appears well-developed. No distress.  HENT:  Head: Normocephalic.  Cardiovascular: Normal rate, regular rhythm, normal heart sounds and intact distal pulses.  Pulmonary/Chest: Effort normal and breath sounds normal.  Abdominal: Soft. Bowel sounds are normal.  Musculoskeletal:        General:  Normal range of motion.     Cervical back: Normal range of motion.       Feet:  Neurological: She is alert and oriented to person, place, and time.  Skin: Skin is warm and dry.  Monofilament completed negative (-) at all 10 sites bilaterally  Psychiatric: She has a normal mood and affect. Her behavior is normal.  Alteration in thought and judgement because she refuses to use the insulin that she knows will help her.     BP 103/61 (BP Location: Left Arm, Patient Position: Sitting)   Pulse 70   Temp 98.4 F (36.9 C)   Ht 5\' 4"  (1.626 m)   Wt 164 lb 6.4 oz (74.6 kg)   SpO2 100%   BMI 28.22 kg/m  Wt Readings from Last 3 Encounters:  05/19/19 164 lb 6.4 oz (74.6 kg)  04/30/19 162 lb (73.5 kg)  02/25/19 151 lb (68.5 kg)     Health Maintenance Due  Topic Date Due  . PNEUMOCOCCAL POLYSACCHARIDE VACCINE AGE 40-64 HIGH RISK  Never done  . OPHTHALMOLOGY EXAM  09/03/2018  . PAP SMEAR-Modifier  06/03/2019  . URINE MICROALBUMIN  06/03/2019    There are no preventive care reminders to display for this patient.  Lab Results  Component Value Date   TSH 2.580 06/03/2018   Lab Results  Component Value Date   WBC 11.8 (H) 04/30/2019   HGB 11.0 (L) 04/30/2019   HCT 33.4 (L) 04/30/2019   MCV 88.6 04/30/2019   PLT 382 04/30/2019   Lab Results  Component Value Date   NA 132 (L) 04/30/2019   K 4.3 04/30/2019   CO2 23 04/30/2019   GLUCOSE 406 (H) 04/30/2019   BUN 15 04/30/2019   CREATININE 0.72 04/30/2019   BILITOT 0.6 02/26/2019   ALKPHOS 46 02/26/2019   AST 11 (L) 02/26/2019   ALT 11 02/26/2019   PROT 5.9 (L) 02/26/2019   ALBUMIN 2.4 (L) 02/26/2019   CALCIUM 8.6 (L) 04/30/2019   ANIONGAP 12 04/30/2019   Lab Results  Component Value Date  CHOL 160 06/03/2018   Lab Results  Component Value Date   HDL 58 06/03/2018   Lab Results  Component Value Date   LDLCALC 80 06/03/2018   Lab Results  Component Value Date   TRIG 109 06/03/2018   Lab Results  Component Value  Date   CHOLHDL 2.8 06/03/2018   Lab Results  Component Value Date   HGBA1C 10.8 (A) 05/19/2019   HGBA1C 10.8 05/19/2019   HGBA1C 10.8 (A) 05/19/2019   HGBA1C 10.8 (A) 05/19/2019      Assessment & Plan:   Problem List Items Addressed This Visit      Unprioritized   Body image disorder   Relevant Orders   Ambulatory referral to Psychiatry   Diabetic foot ulcer (HCC)   Relevant Orders   Comp. Metabolic Panel (12)   High cholesterol   Relevant Orders   Lipid panel   Noncompliance with diabetes treatment   Relevant Orders   Ambulatory referral to Endocrinology   Osteomyelitis of right foot (HCC)   Relevant Medications   gentamicin cream (GARAMYCIN) 0.1 %   Other Relevant Orders   CBC with Differential/Platelet   Painful diabetic neuropathy (HCC)   Posttraumatic stress disorder   Relevant Medications   FLUoxetine (PROZAC) 40 MG capsule   Other Relevant Orders   Ambulatory referral to Psychiatry   Uncontrolled diabetes mellitus due to underlying condition with diabetic retinopathy (HCC)   Relevant Orders   Ambulatory referral to Endocrinology   Microalbumin / creatinine urine ratio    Other Visit Diagnoses    Type 2 diabetes mellitus not at goal [facility])    -  Primary   Relevant Orders   POCT Glucose (Device for Home Use) (Completed)   HgB A1c (Completed)   POCT Urinalysis Dipstick (Completed)   Bipolar disorder, current episode depressed, severe, without psychotic features (HCC)       Relevant Medications   FLUoxetine (PROZAC) 40 MG capsule   Other Relevant Orders   Ambulatory referral to Psychiatry   Diabetic polyneuropathy associated with diabetes mellitus due to underlying condition (HCC)       Relevant Medications   FLUoxetine (PROZAC) 40 MG capsule   Loss of protective sensation of skin of foot          Meds ordered this encounter  Medications  . FLUoxetine (PROZAC) 40 MG capsule    Sig: Take 2 capsules (80 mg total) by mouth every morning.    Dispense:   180 capsule    Refill:  0    Order Specific Question:   Supervising Provider    Answer:   Quentin Angst [id number]  . gentamicin cream (GARAMYCIN) 0.1 %    Sig: Apply 1 application topically 3 (three) times daily.    Dispense:  30 g    Refill:  1    Order Specific Question:   Supervising Provider    Answer:   Quentin Angst [[id number]]    Follow-up: Return in about 3 months (around 08/19/2019).    Barbette Merino, NP

## 2019-05-20 ENCOUNTER — Telehealth: Payer: Self-pay | Admitting: Clinical

## 2019-05-20 LAB — CBC WITH DIFFERENTIAL/PLATELET
Basophils Absolute: 0.2 10*3/uL (ref 0.0–0.2)
Basos: 1 %
EOS (ABSOLUTE): 0.6 10*3/uL — ABNORMAL HIGH (ref 0.0–0.4)
Eos: 6 %
Hematocrit: 35.6 % (ref 34.0–46.6)
Hemoglobin: 11.6 g/dL (ref 11.1–15.9)
Immature Grans (Abs): 0 10*3/uL (ref 0.0–0.1)
Immature Granulocytes: 0 %
Lymphocytes Absolute: 2.8 10*3/uL (ref 0.7–3.1)
Lymphs: 27 %
MCH: 28.8 pg (ref 26.6–33.0)
MCHC: 32.6 g/dL (ref 31.5–35.7)
MCV: 88 fL (ref 79–97)
Monocytes Absolute: 0.8 10*3/uL (ref 0.1–0.9)
Monocytes: 8 %
Neutrophils Absolute: 5.9 10*3/uL (ref 1.4–7.0)
Neutrophils: 58 %
Platelets: 426 10*3/uL (ref 150–450)
RBC: 4.03 x10E6/uL (ref 3.77–5.28)
RDW: 12.3 % (ref 11.7–15.4)
WBC: 10.4 10*3/uL (ref 3.4–10.8)

## 2019-05-20 LAB — COMP. METABOLIC PANEL (12)
AST: 11 IU/L (ref 0–40)
Albumin/Globulin Ratio: 1.1 — ABNORMAL LOW (ref 1.2–2.2)
Albumin: 3.5 g/dL — ABNORMAL LOW (ref 3.8–4.8)
Alkaline Phosphatase: 89 IU/L (ref 48–121)
BUN/Creatinine Ratio: 9 (ref 9–23)
BUN: 8 mg/dL (ref 6–24)
Bilirubin Total: <0.2 mg/dL (ref 0.0–1.2)
Calcium: 9.2 mg/dL (ref 8.7–10.2)
Chloride: 96 mmol/L (ref 96–106)
Creatinine, Ser: 0.85 mg/dL (ref 0.57–1.00)
GFR calc Af Amer: 95 mL/min/{1.73_m2} (ref >59)
GFR calc non Af Amer: 82 mL/min/{1.73_m2} (ref >59)
Globulin, Total: 3.1 g/dL (ref 1.5–4.5)
Glucose: 325 mg/dL — ABNORMAL HIGH (ref 65–99)
Potassium: 4.7 mmol/L (ref 3.5–5.2)
Sodium: 134 mmol/L (ref 134–144)
Total Protein: 6.6 g/dL (ref 6.0–8.5)

## 2019-05-20 LAB — LIPID PANEL
Chol/HDL Ratio: 5.1 ratio — ABNORMAL HIGH (ref 0.0–4.4)
Cholesterol, Total: 310 mg/dL — ABNORMAL HIGH (ref 100–199)
HDL: 61 mg/dL (ref >39)
LDL Chol Calc (NIH): 176 mg/dL — ABNORMAL HIGH (ref 0–99)
Triglycerides: 376 mg/dL — ABNORMAL HIGH (ref 0–149)
VLDL Cholesterol Cal: 73 mg/dL — ABNORMAL HIGH (ref 5–40)

## 2019-05-20 LAB — MICROALBUMIN / CREATININE URINE RATIO
Creatinine, Urine: 186.5 mg/dL
Microalb/Creat Ratio: 7 mg/g creat (ref 0–29)
Microalbumin, Urine: 13.6 ug/mL

## 2019-05-20 NOTE — Telephone Encounter (Signed)
Integrated [facility]al [facility] Case Management Referral Note  05/20/2019 Name: [name] MRN: [id number] DOB: [birth year] [name] is a 49 y.o. year old female who sees Mike Gip, FNP for primary care. LCSW was consulted to assess patient's access to community resources and assist the patient with [facility] .  Assessment: Patient experiencing Financial constraints related to lack of insurance coverage. Patient is also in need of an eye exam. Called patient today to follow up on referral from PCP after visit yesterday. Assessed patient's eligibility for [facility] [place] program. Unfortunately patient is not eligible based on household income.   Review of patient status, including review of consultants reports, relevant laboratory and other test results, and collaboration with appropriate care team members and the patient's provider was performed as part of comprehensive patient evaluation and provision of services.    SDOH (Social Determinants of Health) assessments performed: No  Outpatient Encounter Medications as of 05/20/2019  Medication Sig Note  . ALPRAZolam (XANAX) 1 MG tablet Take 1 mg by mouth at bedtime as needed for anxiety.   Marland Kitchen amphetamine-dextroamphetamine (ADDERALL) 30 MG tablet Take 30 mg by mouth daily.   . Artificial Tear Ointment (DRY EYES OP) Apply 1-2 drops to eye daily as needed (drye eyes).   Marland Kitchen aspirin EC 81 MG tablet Take 1 tablet (81 mg total) by mouth 2 (two) times daily.   Marland Kitchen atorvastatin (LIPITOR) 40 MG tablet Take 1 tablet (40 mg total) by mouth every evening. (Patient taking differently: Take 40 mg by mouth every morning. )   . famotidine (PEPCID) 20 MG tablet Take 1 tablet by mouth twice daily   . FLUoxetine (PROZAC) 40 MG capsule Take 2 capsules (80 mg total) by mouth every morning.   . gabapentin (NEURONTIN) 800 MG tablet TAKE 2 TABLETS BY MOUTH IN THE MORNING AND 3 TABLETS IN THE EVENING (Patient taking differently: Take 1,600-2,400 mg by mouth See  admin instructions. TAKE 2 TABLETS (1600 mg totally) BY MOUTH IN THE MORNING AND 3 TABLETS (2400 mg totally) by mouth IN THE EVENING)   . gentamicin cream (GARAMYCIN) 0.1 % Apply 1 application topically 3 (three) times daily.   Marland Kitchen glucose blood (ONE TOUCH ULTRA TEST) test strip 1 each by Other route 2 (two) times daily. And lancets 2/day.   . insulin aspart protamine- aspart (NOVOLOG MIX 70/30) (70-30) 100 UNIT/ML injection Inject 0.2 mLs (20 Units total) into the skin 2 (two) times daily with a meal. (Patient taking differently: Inject into the skin See admin instructions. Per sliding scale as needed) 04/30/2019: Pt uses taking device to monitoring BG one/day  . Insulin Syringe-Needle U-100 (INSULIN SYRINGE .5CC/30GX1/2") 30G X 1/2" 0.5 ML MISC 1 each by Does not apply route every 12 (twelve) hours.   . metFORMIN (GLUCOPHAGE) 1000 MG tablet Take 1 tablet (1,000 mg total) by mouth 2 (two) times daily with a meal.   . Multiple Vitamins-Minerals (MULTIVITAMIN GUMMIES ADULT PO) Take 1 capsule by mouth daily.   Marland Kitchen spironolactone (ALDACTONE) 100 MG tablet Take 1 tablet by mouth once daily (Patient taking differently: Take 100 mg by mouth every morning. )    No facility-administered encounter medications on file as of 05/20/2019.    Goals Addressed   None       Abigail Butts, LCSW [facility] [phone]

## 2019-05-21 ENCOUNTER — Other Ambulatory Visit: Payer: Self-pay | Admitting: Family Medicine

## 2019-05-21 DIAGNOSIS — K219 Gastro-esophageal reflux disease without esophagitis: Secondary | ICD-10-CM

## 2019-06-09 ENCOUNTER — Encounter (HOSPITAL_COMMUNITY): Payer: Self-pay | Admitting: Psychiatry

## 2019-06-09 ENCOUNTER — Other Ambulatory Visit: Payer: Self-pay

## 2019-06-09 ENCOUNTER — Ambulatory Visit (INDEPENDENT_AMBULATORY_CARE_PROVIDER_SITE_OTHER): Payer: No Payment, Other | Admitting: Licensed Clinical Social Worker

## 2019-06-09 ENCOUNTER — Telehealth (INDEPENDENT_AMBULATORY_CARE_PROVIDER_SITE_OTHER): Payer: No Payment, Other | Admitting: Psychiatry

## 2019-06-09 DIAGNOSIS — F314 Bipolar disorder, current episode depressed, severe, without psychotic features: Secondary | ICD-10-CM

## 2019-06-09 DIAGNOSIS — F418 Other specified anxiety disorders: Secondary | ICD-10-CM | POA: Diagnosis not present

## 2019-06-09 DIAGNOSIS — F419 Anxiety disorder, unspecified: Secondary | ICD-10-CM

## 2019-06-09 MED ORDER — ALPRAZOLAM 1 MG PO TABS: 1.0000 mg | ORAL_TABLET | Freq: Every evening | ORAL | 0 refills | Status: DC | PRN

## 2019-06-09 MED ORDER — AMPHETAMINE-DEXTROAMPHETAMINE 30 MG PO TABS: 30.0000 mg | ORAL_TABLET | Freq: Every day | ORAL | 0 refills | Status: DC

## 2019-06-09 MED ORDER — FLUOXETINE HCL 40 MG PO CAPS: 80.0000 mg | ORAL_CAPSULE | Freq: Every morning | ORAL | 0 refills | Status: DC
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
## 2019-06-10 ENCOUNTER — Other Ambulatory Visit: Payer: Self-pay | Admitting: Nurse Practitioner

## 2019-06-10 DIAGNOSIS — K219 Gastro-esophageal reflux disease without esophagitis: Secondary | ICD-10-CM
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
## 2019-06-11 ENCOUNTER — Emergency Department (HOSPITAL_COMMUNITY): Payer: Medicaid Other

## 2019-06-11 ENCOUNTER — Inpatient Hospital Stay (HOSPITAL_COMMUNITY)
Admission: EM | Admit: 2019-06-11 | Discharge: 2019-06-13 | DRG: 617 | Disposition: A | Discharge status: Home / Self Care | Payer: Medicaid Other | Attending: Internal Medicine | Admitting: Internal Medicine

## 2019-06-11 ENCOUNTER — Other Ambulatory Visit: Payer: Self-pay

## 2019-06-11 DIAGNOSIS — L03031 Cellulitis of right toe: Secondary | ICD-10-CM | POA: Diagnosis present

## 2019-06-11 DIAGNOSIS — IMO0002 Reserved for concepts with insufficient information to code with codable children: Secondary | ICD-10-CM | POA: Diagnosis present

## 2019-06-11 DIAGNOSIS — E10622 Type 1 diabetes mellitus with other skin ulcer: Secondary | ICD-10-CM | POA: Diagnosis present

## 2019-06-11 DIAGNOSIS — Z87891 Personal history of nicotine dependence: Secondary | ICD-10-CM | POA: Diagnosis not present

## 2019-06-11 DIAGNOSIS — Z833 Family history of diabetes mellitus: Secondary | ICD-10-CM | POA: Diagnosis not present

## 2019-06-11 DIAGNOSIS — E10319 Type 1 diabetes mellitus with unspecified diabetic retinopathy without macular edema: Secondary | ICD-10-CM | POA: Diagnosis present

## 2019-06-11 DIAGNOSIS — H547 Unspecified visual loss: Secondary | ICD-10-CM

## 2019-06-11 DIAGNOSIS — H5461 Unqualified visual loss, right eye, normal vision left eye: Secondary | ICD-10-CM | POA: Diagnosis present

## 2019-06-11 DIAGNOSIS — L97519 Non-pressure chronic ulcer of other part of right foot with unspecified severity: Secondary | ICD-10-CM | POA: Diagnosis present

## 2019-06-11 DIAGNOSIS — K3184 Gastroparesis: Secondary | ICD-10-CM | POA: Diagnosis present

## 2019-06-11 DIAGNOSIS — X58XXXA Exposure to other specified factors, initial encounter: Secondary | ICD-10-CM | POA: Diagnosis present

## 2019-06-11 DIAGNOSIS — E282 Polycystic ovarian syndrome: Secondary | ICD-10-CM | POA: Diagnosis present

## 2019-06-11 DIAGNOSIS — S92401A Displaced unspecified fracture of right great toe, initial encounter for closed fracture: Secondary | ICD-10-CM | POA: Diagnosis present

## 2019-06-11 DIAGNOSIS — F329 Major depressive disorder, single episode, unspecified: Secondary | ICD-10-CM | POA: Diagnosis present

## 2019-06-11 DIAGNOSIS — Z7982 Long term (current) use of aspirin: Secondary | ICD-10-CM

## 2019-06-11 DIAGNOSIS — E7800 Pure hypercholesterolemia, unspecified: Secondary | ICD-10-CM | POA: Diagnosis present

## 2019-06-11 DIAGNOSIS — Z818 Family history of other mental and behavioral disorders: Secondary | ICD-10-CM

## 2019-06-11 DIAGNOSIS — I10 Essential (primary) hypertension: Secondary | ICD-10-CM | POA: Diagnosis present

## 2019-06-11 DIAGNOSIS — E08319 Diabetes mellitus due to underlying condition with unspecified diabetic retinopathy without macular edema: Secondary | ICD-10-CM

## 2019-06-11 DIAGNOSIS — E11628 Type 2 diabetes mellitus with other skin complications: Secondary | ICD-10-CM

## 2019-06-11 DIAGNOSIS — E1043 Type 1 diabetes mellitus with diabetic autonomic (poly)neuropathy: Secondary | ICD-10-CM | POA: Diagnosis present

## 2019-06-11 DIAGNOSIS — M009 Pyogenic arthritis, unspecified: Secondary | ICD-10-CM | POA: Diagnosis present

## 2019-06-11 DIAGNOSIS — M869 Osteomyelitis, unspecified: Secondary | ICD-10-CM | POA: Diagnosis present

## 2019-06-11 DIAGNOSIS — H543 Unqualified visual loss, both eyes: Secondary | ICD-10-CM

## 2019-06-11 DIAGNOSIS — Z20822 Contact with and (suspected) exposure to covid-19: Secondary | ICD-10-CM | POA: Diagnosis present

## 2019-06-11 DIAGNOSIS — Z79899 Other long term (current) drug therapy: Secondary | ICD-10-CM

## 2019-06-11 DIAGNOSIS — E119 Type 2 diabetes mellitus without complications: Secondary | ICD-10-CM

## 2019-06-11 DIAGNOSIS — E1065 Type 1 diabetes mellitus with hyperglycemia: Secondary | ICD-10-CM | POA: Diagnosis present

## 2019-06-11 DIAGNOSIS — E10628 Type 1 diabetes mellitus with other skin complications: Secondary | ICD-10-CM | POA: Diagnosis present

## 2019-06-11 DIAGNOSIS — F419 Anxiety disorder, unspecified: Secondary | ICD-10-CM | POA: Diagnosis present

## 2019-06-11 DIAGNOSIS — M79674 Pain in right toe(s): Secondary | ICD-10-CM | POA: Diagnosis present

## 2019-06-11 DIAGNOSIS — M868X7 Other osteomyelitis, ankle and foot: Secondary | ICD-10-CM | POA: Insufficient documentation

## 2019-06-11 DIAGNOSIS — L089 Local infection of the skin and subcutaneous tissue, unspecified: Secondary | ICD-10-CM | POA: Diagnosis present

## 2019-06-11 DIAGNOSIS — E1069 Type 1 diabetes mellitus with other specified complication: Principal | ICD-10-CM | POA: Diagnosis present

## 2019-06-11 DIAGNOSIS — Z885 Allergy status to narcotic agent status: Secondary | ICD-10-CM | POA: Diagnosis not present

## 2019-06-11 DIAGNOSIS — E0865 Diabetes mellitus due to underlying condition with hyperglycemia: Secondary | ICD-10-CM

## 2019-06-11 DIAGNOSIS — M86171 Other acute osteomyelitis, right ankle and foot: Secondary | ICD-10-CM

## 2019-06-11 DIAGNOSIS — Z794 Long term (current) use of insulin: Secondary | ICD-10-CM

## 2019-06-11 DIAGNOSIS — S92403A Displaced unspecified fracture of unspecified great toe, initial encounter for closed fracture: Secondary | ICD-10-CM | POA: Diagnosis present

## 2019-06-11 LAB — CBC WITH DIFFERENTIAL/PLATELET
Abs Immature Granulocytes: 0.05 10*3/uL (ref 0.00–0.07)
Basophils Absolute: 0.1 10*3/uL (ref 0.0–0.1)
Basophils Relative: 1 %
Eosinophils Absolute: 0.7 10*3/uL — ABNORMAL HIGH (ref 0.0–0.5)
Eosinophils Relative: 6 %
HCT: 36.1 % (ref 36.0–46.0)
Hemoglobin: 11.7 g/dL — ABNORMAL LOW (ref 12.0–15.0)
Immature Granulocytes: 0 %
Lymphocytes Relative: 21 %
Lymphs Abs: 2.6 10*3/uL (ref 0.7–4.0)
MCH: 28.5 pg (ref 26.0–34.0)
MCHC: 32.4 g/dL (ref 30.0–36.0)
MCV: 87.8 fL (ref 80.0–100.0)
Monocytes Absolute: 0.8 10*3/uL (ref 0.1–1.0)
Monocytes Relative: 6 %
Neutro Abs: 8 10*3/uL — ABNORMAL HIGH (ref 1.7–7.7)
Neutrophils Relative %: 66 %
Platelets: 426 10*3/uL — ABNORMAL HIGH (ref 150–400)
RBC: 4.11 MIL/uL (ref 3.87–5.11)
RDW: 12.4 % (ref 11.5–15.5)
WBC: 12.3 10*3/uL — ABNORMAL HIGH (ref 4.0–10.5)
nRBC: 0 % (ref 0.0–0.2)

## 2019-06-11 LAB — C-REACTIVE PROTEIN: CRP: 0.7 mg/dL (ref <1.0)

## 2019-06-11 LAB — COMPREHENSIVE METABOLIC PANEL
ALT: 12 U/L (ref 0–44)
AST: 11 U/L — ABNORMAL LOW (ref 15–41)
Albumin: 3.2 g/dL — ABNORMAL LOW (ref 3.5–5.0)
Alkaline Phosphatase: 76 U/L (ref 38–126)
Anion gap: 11 (ref 5–15)
BUN: 10 mg/dL (ref 6–20)
CO2: 24 mmol/L (ref 22–32)
Calcium: 9.3 mg/dL (ref 8.9–10.3)
Chloride: 98 mmol/L (ref 98–111)
Creatinine, Ser: 0.8 mg/dL (ref 0.44–1.00)
GFR calc Af Amer: >60 mL/min (ref >60)
GFR calc non Af Amer: >60 mL/min (ref >60)
Glucose, Bld: 277 mg/dL — ABNORMAL HIGH (ref 70–99)
Potassium: 4.9 mmol/L (ref 3.5–5.1)
Sodium: 133 mmol/L — ABNORMAL LOW (ref 135–145)
Total Bilirubin: 0.5 mg/dL (ref 0.3–1.2)
Total Protein: 7.6 g/dL (ref 6.5–8.1)

## 2019-06-11 LAB — HEMOGLOBIN A1C
Hgb A1c MFr Bld: 11 % — ABNORMAL HIGH (ref 4.8–5.6)
Mean Plasma Glucose: 269 mg/dL

## 2019-06-11 LAB — SEDIMENTATION RATE: Sed Rate: 87 mm/hr — ABNORMAL HIGH (ref 0–22)

## 2019-06-11 LAB — LACTIC ACID, PLASMA: Lactic Acid, Venous: 1.1 mmol/L (ref 0.5–1.9)

## 2019-06-11 LAB — SARS CORONAVIRUS 2 BY RT PCR (HOSPITAL ORDER, PERFORMED IN ~~LOC~~ HOSPITAL LAB): SARS Coronavirus 2: NEGATIVE

## 2019-06-11 LAB — I-STAT BETA HCG BLOOD, ED (MC, WL, AP ONLY): I-stat hCG, quantitative: <5 m[IU]/mL (ref <5)

## 2019-06-11 LAB — PREALBUMIN: Prealbumin: 22.4 mg/dL (ref 18–38)

## 2019-06-11 LAB — HIV ANTIBODY (ROUTINE TESTING W REFLEX): HIV Screen 4th Generation wRfx: NONREACTIVE

## 2019-06-11 LAB — CBG MONITORING, ED: Glucose-Capillary: 218 mg/dL — ABNORMAL HIGH (ref 70–99)

## 2019-06-11 MED ORDER — METRONIDAZOLE 500 MG PO TABS
500.0000 mg | ORAL_TABLET | Freq: Three times a day (TID) | ORAL | Status: DC
  Administered 2019-06-12: 500 mg via ORAL
  Filled 2019-06-11: qty 1

## 2019-06-11 MED ORDER — INSULIN ASPART 100 UNIT/ML ~~LOC~~ SOLN: 4.0000 [IU] | Freq: Three times a day (TID) | SUBCUTANEOUS | Status: DC

## 2019-06-11 MED ORDER — ALPRAZOLAM 0.5 MG PO TABS: 1.0000 mg | ORAL_TABLET | Freq: Every evening | ORAL | Status: DC | PRN

## 2019-06-11 MED ORDER — GABAPENTIN 600 MG PO TABS
2400.0000 mg | ORAL_TABLET | Freq: Every day | ORAL | Status: DC
  Administered 2019-06-11 – 2019-06-12 (×2): 2400 mg via ORAL
  Filled 2019-06-11 (×3): qty 4

## 2019-06-11 MED ORDER — ATORVASTATIN CALCIUM 40 MG PO TABS
40.0000 mg | ORAL_TABLET | Freq: Every morning | ORAL | Status: DC
  Administered 2019-06-13: 40 mg via ORAL
  Filled 2019-06-11: qty 1

## 2019-06-11 MED ORDER — VANCOMYCIN HCL 1250 MG/250ML IV SOLN
1250.0000 mg | Freq: Once | INTRAVENOUS | Status: AC
  Administered 2019-06-11: 1250 mg via INTRAVENOUS
  Filled 2019-06-11: qty 250

## 2019-06-11 MED ORDER — PIPERACILLIN-TAZOBACTAM 3.375 G IVPB 30 MIN
3.3750 g | Freq: Once | INTRAVENOUS | Status: AC
  Administered 2019-06-11: 3.375 g via INTRAVENOUS
  Filled 2019-06-11: qty 50

## 2019-06-11 MED ORDER — ONDANSETRON HCL 4 MG PO TABS: 4.0000 mg | ORAL_TABLET | Freq: Four times a day (QID) | ORAL | Status: DC | PRN

## 2019-06-11 MED ORDER — SODIUM CHLORIDE 0.9 % IV SOLN
2.0000 g | Freq: Three times a day (TID) | INTRAVENOUS | Status: AC
  Administered 2019-06-12 – 2019-06-13 (×6): 2 g via INTRAVENOUS
  Filled 2019-06-11 (×6): qty 2

## 2019-06-11 MED ORDER — ACETAMINOPHEN 325 MG PO TABS: 650.0000 mg | ORAL_TABLET | Freq: Four times a day (QID) | ORAL | Status: DC | PRN

## 2019-06-11 MED ORDER — HYDROCODONE-ACETAMINOPHEN 5-325 MG PO TABS
1.0000 | ORAL_TABLET | ORAL | Status: DC | PRN
  Administered 2019-06-12: 1 via ORAL
  Administered 2019-06-12: 2 via ORAL
  Filled 2019-06-11: qty 2

## 2019-06-11 MED ORDER — SPIRONOLACTONE 25 MG PO TABS: 100.0000 mg | ORAL_TABLET | Freq: Every day | ORAL | Status: DC

## 2019-06-11 MED ORDER — INSULIN NPH (HUMAN) (ISOPHANE) 100 UNIT/ML ~~LOC~~ SUSP
7.0000 [IU] | Freq: Two times a day (BID) | SUBCUTANEOUS | Status: DC
  Administered 2019-06-11 – 2019-06-13 (×4): 7 [IU] via SUBCUTANEOUS

## 2019-06-11 MED ORDER — FLUOXETINE HCL 20 MG PO CAPS
80.0000 mg | ORAL_CAPSULE | Freq: Every morning | ORAL | Status: DC
  Administered 2019-06-13: 80 mg via ORAL
  Filled 2019-06-11: qty 4

## 2019-06-11 MED ORDER — MORPHINE SULFATE (PF) 4 MG/ML IV SOLN
4.0000 mg | Freq: Once | INTRAVENOUS | Status: AC
  Administered 2019-06-11: 4 mg via INTRAVENOUS
  Filled 2019-06-11: qty 1

## 2019-06-11 MED ORDER — FAMOTIDINE 20 MG PO TABS
20.0000 mg | ORAL_TABLET | Freq: Two times a day (BID) | ORAL | Status: DC
  Administered 2019-06-12 – 2019-06-13 (×3): 20 mg via ORAL
  Filled 2019-06-11 (×3): qty 1

## 2019-06-11 MED ORDER — ONDANSETRON HCL 4 MG/2ML IJ SOLN
4.0000 mg | Freq: Once | INTRAMUSCULAR | Status: AC
  Administered 2019-06-11: 4 mg via INTRAVENOUS
  Filled 2019-06-11: qty 2

## 2019-06-11 MED ORDER — GABAPENTIN 800 MG PO TABS
1600.0000 mg | ORAL_TABLET | Freq: Every day | ORAL | Status: DC
  Filled 2019-06-11: qty 2

## 2019-06-11 MED ORDER — ACETAMINOPHEN 650 MG RE SUPP: 650.0000 mg | Freq: Four times a day (QID) | RECTAL | Status: DC | PRN

## 2019-06-11 MED ORDER — INSULIN ASPART 100 UNIT/ML ~~LOC~~ SOLN
0.0000 [IU] | SUBCUTANEOUS | Status: DC
  Administered 2019-06-12: 1 [IU] via SUBCUTANEOUS
  Administered 2019-06-12: 3 [IU] via SUBCUTANEOUS
  Administered 2019-06-12: 1 [IU] via SUBCUTANEOUS
  Administered 2019-06-13: 2 [IU] via SUBCUTANEOUS
  Administered 2019-06-13: 1 [IU] via SUBCUTANEOUS

## 2019-06-11 MED ORDER — ASPIRIN EC 81 MG PO TBEC: 81.0000 mg | DELAYED_RELEASE_TABLET | Freq: Two times a day (BID) | ORAL | Status: DC

## 2019-06-11 MED ORDER — INSULIN NPH (HUMAN) (ISOPHANE) 100 UNIT/ML ~~LOC~~ SUSP
10.0000 [IU] | Freq: Two times a day (BID) | SUBCUTANEOUS | Status: DC
  Filled 2019-06-11: qty 10

## 2019-06-11 MED ORDER — ONDANSETRON HCL 4 MG/2ML IJ SOLN: 4.0000 mg | Freq: Four times a day (QID) | INTRAMUSCULAR | Status: DC | PRN

## 2019-06-11 MED ORDER — VANCOMYCIN HCL IN DEXTROSE 1-5 GM/200ML-% IV SOLN
1000.0000 mg | Freq: Two times a day (BID) | INTRAVENOUS | Status: AC
  Administered 2019-06-12 – 2019-06-13 (×3): 1000 mg via INTRAVENOUS
  Filled 2019-06-11 (×5): qty 200

## 2019-06-11 MED ORDER — INSULIN ASPART 100 UNIT/ML ~~LOC~~ SOLN: 0.0000 [IU] | Freq: Three times a day (TID) | SUBCUTANEOUS | Status: DC

## 2019-06-11 MED ORDER — ENOXAPARIN SODIUM 40 MG/0.4ML ~~LOC~~ SOLN
40.0000 mg | SUBCUTANEOUS | Status: DC
  Filled 2019-06-11: qty 0.4

## 2019-06-11 MED ORDER — INSULIN ASPART 100 UNIT/ML ~~LOC~~ SOLN: 0.0000 [IU] | Freq: Every day | SUBCUTANEOUS | Status: DC

## 2019-06-11 NOTE — Progress Notes (Signed)
Pharmacy Antibiotic Note  [name] is a 49 y.o. female admitted on 06/11/2019 with cellulitis.  Pharmacy has been consulted for vancomycin dosing.  Patient afebrile. WBC count slightly elevated at 12.3. Creatinine appears stable at 0.8, eCrCl 71 ml/min. Toe X-ray showing septic arthritis and osteomyelitis surrounding first interphalangeal joint along with extensive soft tissues first digit.   Plan: Vancomycin 1250 mg IV x 1 loading dose  Vancomycin 1000 mg IV every 12 hours.  Goal trough 15-20 mcg/mL.  Zosyn 3.375 g IV x1 per MD  Follow up clinical status, renal function, culture data, levels at steady state Monitor length of therapy, opportunities for de-escalation    Temp (24hrs), Avg:98.6 F (37 C), Min:98.2 F (36.8 C), Max:99 F (37.2 C)  Recent Labs  Lab 06/11/19 1942  WBC 12.3*    CrCl cannot be calculated (Patient's most recent lab result is older than the maximum 21 days allowed.).    Allergies  Allergen Reactions  . Tramadol Other (See Comments)    Unknown; pt claims she doesn't recall having this allergy     Antimicrobials this admission: vancomycin 6/9 >>  zosyn 6/9 x1   Dose adjustments this admission: N/A  Microbiology results: covid negative  Thank you for allowing pharmacy to be a part of this patient's care.  Fara Olden, PharmD PGY-1 Pharmacy Resident   Please check amion for clinical pharmacist contact number 06/11/2019 8:23 PM

## 2019-06-11 NOTE — H&P (Addendum)
History and Physical    [name] DQQ:[id number] DOB: 07-Oct-1970 DOA: 06/11/2019  PCP: Barbette Merino, NP  Patient coming from: Home  I have personally briefly reviewed patient's old medical records in [facility]  Chief Complaint: R great toe pain  HPI: [name] is a 49 y.o. female with medical history significant of DM2 poorly controlled, blindness, anxiety, depression.  Pt with h/o diabetic foot ulcer of R great toe in past.  Grew pseudomonas in 2020.  Had cellulitis in Feb 2021 and again in April 2021.  Was put on Keflex and bactrim most recently in April, finished course.  Today tripped on case of water on ground secondary to blindness, toe 6/10 pain post trip, in to ED.   ED Course: Has osteomyelitis, cellulitis, and septic arthritis of the R great toe.  Superimposed on this she also has a fracture of the right great toe.  Pt started on zosyn/vanc in ED, Dr. Victorino Dike will see in consult, sounds like will end up with toe amputation.   Review of Systems: As per HPI, otherwise all review of systems negative.  Past Medical History:  Diagnosis Date  . Anxiety   . Depression   . Diabetes mellitus without complication (HCC)   . High cholesterol   . Hypertension   . Neuropathy   . PCOS (polycystic ovarian syndrome)   . Type 1 diabetes [facility])     Past Surgical History:  Procedure Laterality Date  . ELBOW SURGERY    . INSERTION OF AHMED VALVE Right 09/07/2015   Procedure: INSERTION OF AHMED VALVE;  Surgeon: Edmon Crape, MD;  Location: [facility] OR;  Service: Ophthalmology;  Laterality: Right;  . LASER PHOTO ABLATION Right 09/07/2015   Procedure: LASER PHOTO ABLATION;  Surgeon: Edmon Crape, MD;  Location: [facility] OR;  Service: Ophthalmology;  Laterality: Right;  . PARS PLANA VITRECTOMY Right 09/07/2015   Procedure: PARS PLANA VITRECTOMY WITH 25 GAUGE,, with panretinal endolaser,;  Surgeon: Edmon Crape, MD;  Location: [facility] OR;  Service: Ophthalmology;  Laterality: Right;      reports that she quit smoking about 18 years ago. Her smoking use included cigarettes. She has never used smokeless tobacco. She reports previous alcohol use. She reports current drug use. Drug: Marijuana.  Allergies  Allergen Reactions  . Tramadol Other (See Comments)    Unknown; pt claims she doesn't recall having this allergy     Family History  Problem Relation Age of Onset  . Depression Mother   . Anxiety disorder Mother   . Diabetes Mother   . Diabetes Father   . Diabetes Brother      Prior to Admission medications   Medication Sig Start Date End Date Taking? Authorizing Provider  ALPRAZolam Prudy Feeler) 1 MG tablet Take 1 tablet (1 mg total) by mouth at bedtime as needed for anxiety. 06/09/19 07/09/19  Toy Cookey E, NP  amphetamine-dextroamphetamine (ADDERALL) 30 MG tablet Take 1 tablet by mouth daily. 06/09/19 07/09/19  Shanna Cisco, NP  Artificial Tear Ointment (DRY EYES OP) Apply 1-2 drops to eye daily as needed (drye eyes).    [provider]  aspirin EC 81 MG tablet Take 1 tablet (81 mg total) by mouth 2 (two) times daily. 06/03/18   Mike Gip, FNP  atorvastatin (LIPITOR) 40 MG tablet Take 1 tablet (40 mg total) by mouth every evening. Patient taking differently: Take 40 mg by mouth every morning.  06/03/18   Mike Gip, FNP  famotidine (PEPCID)  20 MG tablet Take 1 tablet by mouth twice daily 06/10/19   Barbette Merino, NP  FLUoxetine (PROZAC) 40 MG capsule Take 2 capsules (80 mg total) by mouth every morning. 06/09/19 09/07/19  Toy Cookey E, NP  gabapentin (NEURONTIN) 800 MG tablet TAKE 2 TABLETS BY MOUTH IN THE MORNING AND 3 TABLETS IN THE EVENING Patient taking differently: Take 1,600-2,400 mg by mouth See admin instructions. TAKE 2 TABLETS (1600 mg totally) BY MOUTH IN THE MORNING AND 3 TABLETS (2400 mg totally) by mouth IN THE EVENING 11/22/18   Massie Maroon, FNP  gentamicin cream (GARAMYCIN) 0.1 % Apply 1 application topically 3 (three)  times daily. 05/19/19   Barbette Merino, NP  glucose blood (ONE TOUCH ULTRA TEST) test strip 1 each by Other route 2 (two) times daily. And lancets 2/day. 06/03/18   Mike Gip, FNP  insulin aspart protamine- aspart (NOVOLOG MIX 70/30) (70-30) 100 UNIT/ML injection Inject 0.2 mLs (20 Units total) into the skin 2 (two) times daily with a meal. Patient taking differently: Inject into the skin See admin instructions. Per sliding scale as needed 11/19/18   Massie Maroon, FNP  Insulin Syringe-Needle U-100 (INSULIN SYRINGE .5CC/30GX1/2") 30G X 1/2" 0.5 ML MISC 1 each by Does not apply route every 12 (twelve) hours. 11/19/18   Massie Maroon, FNP  metFORMIN (GLUCOPHAGE) 1000 MG tablet Take 1 tablet (1,000 mg total) by mouth 2 (two) times daily with a meal. 11/19/18   Massie Maroon, FNP  Multiple Vitamins-Minerals (MULTIVITAMIN GUMMIES ADULT PO) Take 1 capsule by mouth daily.    [provider]  spironolactone (ALDACTONE) 100 MG tablet Take 1 tablet by mouth once daily Patient taking differently: Take 100 mg by mouth every morning.  03/17/19   Kallie Locks, FNP    Physical Exam: Vitals:   06/11/19 1443 06/11/19 2016 06/11/19 2100  BP: 132/83 110/72   Pulse: 81 74   Resp: 18 18   Temp: 99 F (37.2 C) 98.2 F (36.8 C)   TempSrc: Oral Oral   SpO2: 100% 100%   Weight:   74.4 kg  Height:   5\' 4"  (1.626 m)    Constitutional: NAD, calm, comfortable Eyes: PERRL, lids and conjunctivae normal ENMT: Mucous membranes are moist. Posterior pharynx clear of any exudate or lesions.Normal dentition.  Neck: normal, supple, no masses, no thyromegaly Respiratory: clear to auscultation bilaterally, no wheezing, no crackles. Normal respiratory effort. No accessory muscle use.  Cardiovascular: Regular rate and rhythm, no murmurs / rubs / gallops. No extremity edema. 2+ pedal pulses. No carotid bruits.  Abdomen: no tenderness, no masses palpated. No hepatosplenomegaly. Bowel sounds positive.   Musculoskeletal: R great toe erythematous and edematous.  Erythema extends up to mid ankle.  Ankle with full ROM.  DP present. Skin: no rashes, lesions, ulcers. No induration Neurologic: CN 2-12 grossly intact. Sensation intact, DTR normal. Strength 5/5 in all 4.  Psychiatric: Normal judgment and insight. Alert and oriented x 3. Normal mood.    Labs on Admission: I have personally reviewed following labs and imaging studies  CBC: Recent Labs  Lab 06/11/19 1942  WBC 12.3*  NEUTROABS 8.0*  HGB 11.7*  HCT 36.1  MCV 87.8  PLT 426*   Basic Metabolic Panel: Recent Labs  Lab 06/11/19 1942  NA 133*  K 4.9  CL 98  CO2 24  GLUCOSE 277*  BUN 10  CREATININE 0.80  CALCIUM 9.3   GFR: Estimated Creatinine Clearance: 86.8 mL/min (by C-G  formula based on SCr of 0.8 mg/dL). Liver Function Tests: Recent Labs  Lab 06/11/19 1942  AST 11*  ALT 12  ALKPHOS 76  BILITOT 0.5  PROT 7.6  ALBUMIN 3.2*   No results for input(s): LIPASE, AMYLASE in the last 168 hours. No results for input(s): AMMONIA in the last 168 hours. Coagulation Profile: No results for input(s): INR, PROTIME in the last 168 hours. Cardiac Enzymes: No results for input(s): CKTOTAL, CKMB, CKMBINDEX, TROPONINI in the last 168 hours. BNP (last 3 results) No results for input(s): PROBNP in the last 8760 hours. HbA1C: No results for input(s): HGBA1C in the last 72 hours. CBG: No results for input(s): GLUCAP in the last 168 hours. Lipid Profile: No results for input(s): CHOL, HDL, LDLCALC, TRIG, CHOLHDL, LDLDIRECT in the last 72 hours. Thyroid Function Tests: No results for input(s): TSH, T4TOTAL, FREET4, T3FREE, THYROIDAB in the last 72 hours. Anemia Panel: No results for input(s): VITAMINB12, FOLATE, FERRITIN, TIBC, IRON, RETICCTPCT in the last 72 hours. Urine analysis:    Component Value Date/Time   COLORURINE YELLOW 06/07/2017 1226   APPEARANCEUR CLEAR 06/07/2017 1226   LABSPEC 1.032 (H) 06/07/2017 1226    PHURINE 5.0 06/07/2017 1226   GLUCOSEU >=500 (A) 06/07/2017 1226   HGBUR NEGATIVE 06/07/2017 1226   BILIRUBINUR neg 05/19/2019 1218   KETONESUR 80 (A) 06/07/2017 1226   PROTEINUR Negative 05/19/2019 1218   PROTEINUR 30 (A) 06/07/2017 1226   UROBILINOGEN 0.2 05/19/2019 1218   UROBILINOGEN 0.2 11/28/2013 0023   NITRITE neg 05/19/2019 1218   NITRITE NEGATIVE 06/07/2017 1226   LEUKOCYTESUR Negative 05/19/2019 1218    Radiological Exams on Admission: DG Toe Great Right  Result Date: 06/11/2019 CLINICAL DATA:  Tripped and fell 2 weeks ago EXAM: RIGHT GREAT TOE COMPARISON:  04/30/2019 FINDINGS: Frontal, oblique, lateral views of the right great toe are obtained. There is abnormal appearance of the distal margin of the first proximal phalanx with periosteal reaction and overlying soft tissue swelling, consistent with osteomyelitis. Superimposed upon this there is a comminuted fracture of the medial aspect distal margin first proximal phalanx. There are also erosive changes along the dorsal proximal aspect of the first distal phalanx consistent with osteomyelitis and likely septic arthritis of the first interphalangeal joint. There is diffuse soft tissue edema of the first digit. IMPRESSION: 1. Radiographic evidence of septic arthritis and osteomyelitis surrounding the first interphalangeal joint. 2. Superimposed fracture through the distal medial aspect of the first proximal phalanx. 3. Extensive soft tissue edema. Electronically Signed   By: Randa Ngo M.D.   On: 06/11/2019 15:35    EKG: Independently reviewed.  Assessment/Plan Principal Problem:   Osteomyelitis of great toe of right foot (HCC) Active Problems:   Diabetic foot infection ([place])   Uncontrolled diabetes mellitus due to underlying condition with diabetic retinopathy ([place])   Blindness   Fracture of right great toe    1. Osteomyelitis of great toe of R foot and diabetic infection - 1. Diabetic foot infection pathway 2. Got  zosyn / vanc in ED 3. Will put pt on cefepime + flagyl (has h/o pseudomonas infection in that very toe in 2020). 4. MRSA PCR nares pending, if neg, consider [place] (never had h/o MRSA before). 5. WBC 12.3k no other SIRS 6. Ortho to see in AM, 7. Actually per patient, sounds like she is going to OR in AM and apparently got told "dont eat anything after MN" already 1. Will go ahead and change orders around to facilitate this  2. DM2 - 1. Apparently just on metformin at home, no taking insulin anymore 2. A1C 11 3. While here will put on NPH 7u BID 4. And sensitive scale SSI Q4H 3. B blindness - chronic  DVT prophylaxis: SCDs Code Status: Full Family Communication: No family in room Disposition Plan: Home, most likely after surgical treatment. Consults called: EDP spoke with Dr. Victorino Dike Admission status: Admit to inpatient  Severity of Illness: The appropriate patient status for this patient is INPATIENT. Inpatient status is judged to be reasonable and necessary in order to provide the required intensity of service to ensure the patient's safety. The patient's presenting symptoms, physical exam findings, and initial radiographic and laboratory data in the context of their chronic comorbidities is felt to place them at high risk for further clinical deterioration. Furthermore, it is not anticipated that the patient will be medically stable for discharge from the hospital within 2 midnights of admission. The following factors support the patient status of inpatient.   IP status for osteomyelitis of R great toe.  Likely to need surgical intervention.   * I certify that at the point of admission it is my clinical judgment that the patient will require inpatient hospital care spanning beyond 2 midnights from the point of admission due to high intensity of service, high risk for further deterioration and high frequency of surveillance required.*    Atilano Covelli M. DO [facility]  How to contact the [facility] Attending or Consulting provider 7A - 7P or covering provider during after hours 7P -7A, for this patient?  1. Check the care team in [facility] and look for a) attending/consulting [facility] provider listed and b) the [facility] team listed 2. Log into [email or web address]  Amion Physician Scheduling and messaging for groups and whole hospitals  On call and physician scheduling software for group practices, residents, hospitalists and other medical providers for call, clinic, rotation and shift schedules. OnCall Enterprise is a hospital-wide system for scheduling doctors and paging doctors on call. EasyPlot is for scientific plotting and data analysis.  [email or web address]  and use [place]'s universal password to access. If you do not have the password, please contact the hospital operator.  3. Locate the [facility] provider you are looking for under [facility] and page to a number that you can be directly reached. 4. If you still have difficulty reaching the provider, please page the [facility] (Director on Call) for the Hospitalists listed on amion for assistance.  06/11/2019, 9:39 PM

## 2019-06-11 NOTE — H&P (View-Only) (Signed)
Reason for Consult:  Right hallux swelling Referring Physician: Domenic Moras, PA-C  [name] is an 49 y.o. female.  HPI: [name] is a 49 y/o female with PMh of poorly controlled type I diabetes and blindness.  She has a h/o recurrent ulcers of the right hallux.  She injured the toe today when she kicked a case of water sitting on the floor.  She injured it about two weeks ago similarly.  She c/o swelling and soreness at the toe thought she has a h/o peripheral neuropathy.  She denies f/c/n/v/wt loss.  Past Medical History:  Diagnosis Date  . Anxiety   . Depression   . Diabetes mellitus without complication ([place])   . High cholesterol   . Hypertension   . Neuropathy   . PCOS (polycystic ovarian syndrome)   . Type 1 diabetes [facility])     Past Surgical History:  Procedure Laterality Date  . ELBOW SURGERY    . INSERTION OF AHMED VALVE Right 09/07/2015   Procedure: INSERTION OF AHMED VALVE;  Surgeon: Hurman Horn, MD;  Location: [place];  Service: Ophthalmology;  Laterality: Right;  . LASER PHOTO ABLATION Right 09/07/2015   Procedure: LASER PHOTO ABLATION;  Surgeon: Hurman Horn, MD;  Location: [place];  Service: Ophthalmology;  Laterality: Right;  . PARS PLANA VITRECTOMY Right 09/07/2015   Procedure: PARS PLANA VITRECTOMY WITH 25 GAUGE,, with panretinal endolaser,;  Surgeon: Hurman Horn, MD;  Location: [place];  Service: Ophthalmology;  Laterality: Right;    Family History  Problem Relation Age of Onset  . Depression Mother   . Anxiety disorder Mother   . Diabetes Mother   . Diabetes Father   . Diabetes Brother     Social History:  reports that she quit smoking about 18 years ago. Her smoking use included cigarettes. She has never used smokeless tobacco. She reports previous alcohol use. She reports current drug use. Drug: Marijuana.  Allergies:  Allergies  Allergen Reactions  . Tramadol Other (See Comments)    Unknown; pt claims she doesn't recall having this allergy      Medications: I have reviewed the patient's current medications.  Results for orders placed or performed during the hospital encounter of 06/11/19 (from the past 48 hour(s))  CBC with Differential     Status: Abnormal   Collection Time: 06/11/19  7:42 PM  Result Value Ref Range   WBC 12.3 (H) 4.0 - 10.5 K/uL   RBC 4.11 3.87 - 5.11 MIL/uL   Hemoglobin 11.7 (L) 12.0 - 15.0 g/dL   HCT 36.1 36.0 - 46.0 %   MCV 87.8 80.0 - 100.0 fL   MCH 28.5 26.0 - 34.0 pg   MCHC 32.4 30.0 - 36.0 g/dL   RDW 12.4 11.5 - 15.5 %   Platelets 426 (H) 150 - 400 K/uL   nRBC 0.0 0.0 - 0.2 %   Neutrophils Relative % 66 %   Neutro Abs 8.0 (H) 1.7 - 7.7 K/uL   Lymphocytes Relative 21 %   Lymphs Abs 2.6 0.7 - 4.0 K/uL   Monocytes Relative 6 %   Monocytes Absolute 0.8 0.1 - 1.0 K/uL   Eosinophils Relative 6 %   Eosinophils Absolute 0.7 (H) 0.0 - 0.5 K/uL   Basophils Relative 1 %   Basophils Absolute 0.1 0.0 - 0.1 K/uL   Immature Granulocytes 0 %   Abs Immature Granulocytes 0.05 0.00 - 0.07 K/uL    Comment: Performed at [facility],  [address] [place], [place] [address]  Comprehensive metabolic panel     Status: Abnormal   Collection Time: 06/11/19  7:42 PM  Result Value Ref Range   Sodium 133 (L) 135 - 145 mmol/L   Potassium 4.9 3.5 - 5.1 mmol/L   Chloride 98 98 - 111 mmol/L   CO2 24 22 - 32 mmol/L   Glucose, Bld 277 (H) 70 - 99 mg/dL    Comment: Glucose reference range applies only to samples taken after fasting for at least 8 hours.   BUN 10 6 - 20 mg/dL   Creatinine, Ser 5.36 0.44 - 1.00 mg/dL   Calcium 9.3 8.9 - 14.4 mg/dL   Total Protein 7.6 6.5 - 8.1 g/dL   Albumin 3.2 (L) 3.5 - 5.0 g/dL   AST 11 (L) 15 - 41 U/L   ALT 12 0 - 44 U/L   Alkaline Phosphatase 76 38 - 126 U/L   Total Bilirubin 0.5 0.3 - 1.2 mg/dL   GFR calc non Af Amer >60 >60 mL/min   GFR calc Af Amer >60 >60 mL/min   Anion gap 11 5 - 15    Comment: Performed at [facility], [address], [place],  [place] [address]  Sedimentation rate     Status: Abnormal   Collection Time: 06/11/19  7:42 PM  Result Value Ref Range   Sed Rate 87 (H) 0 - 22 mm/hr    Comment: Performed at [facility], [address], [place], [place] [address]  C-reactive protein     Status: None   Collection Time: 06/11/19  7:42 PM  Result Value Ref Range   CRP 0.7 <1.0 mg/dL    Comment: Performed at [facility], [address] [place], [place] [address]  I-Stat Beta hCG blood, ED ([facility], [facility], [facility] only)     Status: None   Collection Time: 06/11/19  7:48 PM  Result Value Ref Range   I-stat hCG, quantitative <5.0 <5 mIU/mL   Comment 3            Comment:   GEST. AGE      CONC.  (mIU/mL)   <=1 WEEK        5 - 50     2 WEEKS       50 - 500     3 WEEKS       100 - 10,000     4 WEEKS     1,000 - 30,000        FEMALE AND NON-PREGNANT FEMALE:     LESS THAN 5 mIU/mL   SARS Coronavirus 2 by RT PCR (hospital order, performed in [facility] hospital lab) Nasopharyngeal Nasopharyngeal Swab     Status: None   Collection Time: 06/11/19  7:50 PM   Specimen: Nasopharyngeal Swab  Result Value Ref Range   SARS Coronavirus 2 NEGATIVE NEGATIVE    Comment: (NOTE) SARS-CoV-2 target nucleic acids are NOT DETECTED. The SARS-CoV-2 RNA is generally detectable in upper and lower respiratory specimens during the acute phase of infection. The lowest concentration of SARS-CoV-2 viral copies this assay can detect is 250 copies / mL. A negative result does not preclude SARS-CoV-2 infection and should not be used as the sole basis for treatment or other patient management decisions.  A negative result may occur with improper specimen collection / handling, submission of specimen other than nasopharyngeal swab, presence of viral mutation(s) within the areas targeted by this assay,  and inadequate number of viral copies (<250 copies / mL). A negative result must be combined with clinical observations, patient history, and epidemiological  information. Fact Sheet for Patients:   BoilerBrush.com.cy Fact Sheet for Healthcare Providers: https://pope.com/ This test is not yet approved or cleared  by the [place] FDA and has been authorized for detection and/or diagnosis of SARS-CoV-2 by FDA under an Emergency Use Authorization (EUA).  This EUA will remain in effect (meaning this test can be used) for the duration of the COVID-19 declaration under Section 564(b)(1) of the Act, 21 U.S.C. section 360bbb-3(b)(1), unless the authorization is terminated or revoked sooner. Performed at [facility], [address], [place], [place] [address]   Lactic acid, plasma     Status: None   Collection Time: 06/11/19  8:03 PM  Result Value Ref Range   Lactic Acid, Venous 1.1 0.5 - 1.9 mmol/L    Comment: Performed at [facility], [address], [place], [place] [address]  Hemoglobin A1c     Status: Abnormal   Collection Time: 06/11/19  9:38 PM  Result Value Ref Range   Hgb A1c MFr Bld 11.0 (H) 4.8 - 5.6 %    Comment: (NOTE) Pre diabetes:          5.7%-6.4% Diabetes:              >6.4% Glycemic control for   <7.0% adults with diabetes    Mean Plasma Glucose 269 mg/dL    Comment: Performed at [facility], [address] [place] [address] [place], [place] [address]    DG Toe Great Right  Result Date: 06/11/2019 CLINICAL DATA:  Tripped and fell 2 weeks ago EXAM: RIGHT GREAT TOE COMPARISON:  04/30/2019 FINDINGS: Frontal, oblique, lateral views of the right great toe are obtained. There is abnormal appearance of the distal margin of the first proximal phalanx with periosteal reaction and overlying soft tissue swelling, consistent with osteomyelitis. Superimposed upon this there is a comminuted fracture of the medial aspect distal margin first proximal phalanx. There are also erosive changes along the dorsal proximal aspect of the first distal phalanx consistent with osteomyelitis and  likely septic arthritis of the first interphalangeal joint. There is diffuse soft tissue edema of the first digit. IMPRESSION: 1. Radiographic evidence of septic arthritis and osteomyelitis surrounding the first interphalangeal joint. 2. Superimposed fracture through the distal medial aspect of the first proximal phalanx. 3. Extensive soft tissue edema. Electronically Signed   By: Sharlet Salina M.D.   On: 06/11/2019 15:35    ROS:  As above.  10 system review is o/w negative PE:  Blood pressure 110/72, pulse 74, temperature 98.2 F (36.8 C), temperature source Oral, resp. rate 18, height 5\' 4"  (1.626 m), weight 74.4 kg, SpO2 100 %. wn wd woman in nad.  A and O.  Respirations unlabored.  Unable to see.  Gait is heel to toe bilat without antalgia.  R hallux with swelling and erythema.  Ulcer present medially.  TTP at the toe.  Diminshed sens to LT at the forefoot.  5/5 strength in PF and DF of the ankle and toes.  1+ dp pulse.  No lymphadenopathy.  Assessment/Plan: R hallux osteomyelitis - I explained the nature of bone infection to the patient in detail.  At this point I believe amputation of the hallux is indicated to eradicate deep infection at the proxima and distal phalanges.  SHe understands the risks and benefits of the alternative treatment options and  elects surgical treatment.  The risks and benefits of the alternative treatment options have been discussed in detail.  The patient wishes to proceed with surgery and specifically understands risks of bleeding, infection, nerve damage, blood clots, need for additional surgery, amputation and death. I'll post her for surgery tomorrow as an add on in the afternoon.  Toni Arthurs 06/11/2019, 10:25 PM

## 2019-06-11 NOTE — ED Triage Notes (Signed)
Pt tripped in her house and is here for eval of possible broken R great toe.

## 2019-06-11 NOTE — Progress Notes (Signed)
Pharmacy Antibiotic Note  [name] is a 49 y.o. female admitted on 06/11/2019 with cellulitis.  Pharmacy has been consulted for vancomycin dosing.Now adding on cefepime.   Patient afebrile. WBC count slightly elevated at 12.3. Creatinine appears stable at 0.8, eCrCl 71 ml/min. Toe X-ray showing septic arthritis and osteomyelitis surrounding first interphalangeal joint along with extensive soft tissues first digit.   Plan: Continue vancomycin as ordered Cefepime 2g IV Q8H F/u renal fxn, C&S, clinical status and LOT   Temp (24hrs), Avg:98.6 F (37 C), Min:98.2 F (36.8 C), Max:99 F (37.2 C)  Recent Labs  Lab 06/11/19 1942 06/11/19 2003  WBC 12.3*  --   CREATININE 0.80  --   LATICACIDVEN  --  1.1    Estimated Creatinine Clearance: 86.8 mL/min (by C-G formula based on SCr of 0.8 mg/dL).    Allergies  Allergen Reactions  . Tramadol Other (See Comments)    Unknown; pt claims she doesn't recall having this allergy     Antimicrobials this admission: vancomycin 6/9 >>  zosyn 6/9 x1  Cefepime 6/10>>  Dose adjustments this admission: N/A  Microbiology results: covid negative  Thank you for allowing pharmacy to be a part of this patient's care.  Lysle Pearl, PharmD, BCPS Clinical Pharmacist Please see AMION for all pharmacy numbers 06/11/2019 9:33 PM

## 2019-06-11 NOTE — Consult Note (Signed)
Reason for Consult:  Right hallux swelling Referring Physician: Domenic Moras, PA-C  [name] is an 49 y.o. female.  HPI: [name] is a 49 y/o female with PMh of poorly controlled type I diabetes and blindness.  She has a h/o recurrent ulcers of the right hallux.  She injured the toe today when she kicked a case of water sitting on the floor.  She injured it about two weeks ago similarly.  She c/o swelling and soreness at the toe thought she has a h/o peripheral neuropathy.  She denies f/c/n/v/wt loss.  Past Medical History:  Diagnosis Date  . Anxiety   . Depression   . Diabetes mellitus without complication ([place])   . High cholesterol   . Hypertension   . Neuropathy   . PCOS (polycystic ovarian syndrome)   . Type 1 diabetes [facility])     Past Surgical History:  Procedure Laterality Date  . ELBOW SURGERY    . INSERTION OF AHMED VALVE Right 09/07/2015   Procedure: INSERTION OF AHMED VALVE;  Surgeon: Hurman Horn, MD;  Location: [place];  Service: Ophthalmology;  Laterality: Right;  . LASER PHOTO ABLATION Right 09/07/2015   Procedure: LASER PHOTO ABLATION;  Surgeon: Hurman Horn, MD;  Location: [place];  Service: Ophthalmology;  Laterality: Right;  . PARS PLANA VITRECTOMY Right 09/07/2015   Procedure: PARS PLANA VITRECTOMY WITH 25 GAUGE,, with panretinal endolaser,;  Surgeon: Hurman Horn, MD;  Location: [place];  Service: Ophthalmology;  Laterality: Right;    Family History  Problem Relation Age of Onset  . Depression Mother   . Anxiety disorder Mother   . Diabetes Mother   . Diabetes Father   . Diabetes Brother     Social History:  reports that she quit smoking about 18 years ago. Her smoking use included cigarettes. She has never used smokeless tobacco. She reports previous alcohol use. She reports current drug use. Drug: Marijuana.  Allergies:  Allergies  Allergen Reactions  . Tramadol Other (See Comments)    Unknown; pt claims she doesn't recall having this allergy      Medications: I have reviewed the patient's current medications.  Results for orders placed or performed during the hospital encounter of 06/11/19 (from the past 48 hour(s))  CBC with Differential     Status: Abnormal   Collection Time: 06/11/19  7:42 PM  Result Value Ref Range   WBC 12.3 (H) 4.0 - 10.5 K/uL   RBC 4.11 3.87 - 5.11 MIL/uL   Hemoglobin 11.7 (L) 12.0 - 15.0 g/dL   HCT 36.1 36.0 - 46.0 %   MCV 87.8 80.0 - 100.0 fL   MCH 28.5 26.0 - 34.0 pg   MCHC 32.4 30.0 - 36.0 g/dL   RDW 12.4 11.5 - 15.5 %   Platelets 426 (H) 150 - 400 K/uL   nRBC 0.0 0.0 - 0.2 %   Neutrophils Relative % 66 %   Neutro Abs 8.0 (H) 1.7 - 7.7 K/uL   Lymphocytes Relative 21 %   Lymphs Abs 2.6 0.7 - 4.0 K/uL   Monocytes Relative 6 %   Monocytes Absolute 0.8 0.1 - 1.0 K/uL   Eosinophils Relative 6 %   Eosinophils Absolute 0.7 (H) 0.0 - 0.5 K/uL   Basophils Relative 1 %   Basophils Absolute 0.1 0.0 - 0.1 K/uL   Immature Granulocytes 0 %   Abs Immature Granulocytes 0.05 0.00 - 0.07 K/uL    Comment: Performed at [facility],  [address] [place], [place] [address]  Comprehensive metabolic panel     Status: Abnormal   Collection Time: 06/11/19  7:42 PM  Result Value Ref Range   Sodium 133 (L) 135 - 145 mmol/L   Potassium 4.9 3.5 - 5.1 mmol/L   Chloride 98 98 - 111 mmol/L   CO2 24 22 - 32 mmol/L   Glucose, Bld 277 (H) 70 - 99 mg/dL    Comment: Glucose reference range applies only to samples taken after fasting for at least 8 hours.   BUN 10 6 - 20 mg/dL   Creatinine, Ser 0.80 0.44 - 1.00 mg/dL   Calcium 9.3 8.9 - 10.3 mg/dL   Total Protein 7.6 6.5 - 8.1 g/dL   Albumin 3.2 (L) 3.5 - 5.0 g/dL   AST 11 (L) 15 - 41 U/L   ALT 12 0 - 44 U/L   Alkaline Phosphatase 76 38 - 126 U/L   Total Bilirubin 0.5 0.3 - 1.2 mg/dL   GFR calc non Af Amer >60 >60 mL/min   GFR calc Af Amer >60 >60 mL/min   Anion gap 11 5 - 15    Comment: Performed at [facility], [address] [place],  [place] [address]  Sedimentation rate     Status: Abnormal   Collection Time: 06/11/19  7:42 PM  Result Value Ref Range   Sed Rate 87 (H) 0 - 22 mm/hr    Comment: Performed at [facility], [address] [place], [place] [address]  C-reactive protein     Status: None   Collection Time: 06/11/19  7:42 PM  Result Value Ref Range   CRP 0.7 <1.0 mg/dL    Comment: Performed at [facility], [address] [place], [place] [address]  I-Stat Beta hCG blood, ED ([facility], [facility], [facility] only)     Status: None   Collection Time: 06/11/19  7:48 PM  Result Value Ref Range   I-stat hCG, quantitative <5.0 <5 mIU/mL   Comment 3            Comment:   GEST. AGE      CONC.  (mIU/mL)   <=1 WEEK        5 - 50     2 WEEKS       50 - 500     3 WEEKS       100 - 10,000     4 WEEKS     1,000 - 30,000        FEMALE AND NON-PREGNANT FEMALE:     LESS THAN 5 mIU/mL   SARS Coronavirus 2 by RT PCR (hospital order, performed in [place] hospital lab) Nasopharyngeal Nasopharyngeal Swab     Status: None   Collection Time: 06/11/19  7:50 PM   Specimen: Nasopharyngeal Swab  Result Value Ref Range   SARS Coronavirus 2 NEGATIVE NEGATIVE    Comment: (NOTE) SARS-CoV-2 target nucleic acids are NOT DETECTED. The SARS-CoV-2 RNA is generally detectable in upper and lower respiratory specimens during the acute phase of infection. The lowest concentration of SARS-CoV-2 viral copies this assay can detect is 250 copies / mL. A negative result does not preclude SARS-CoV-2 infection and should not be used as the sole basis for treatment or other patient management decisions.  A negative result may occur with improper specimen collection / handling, submission of specimen other than nasopharyngeal swab, presence of viral mutation(s) within the areas targeted by this assay,   and inadequate number of viral copies (<250 copies / mL). A negative result must be combined with clinical observations, patient history, and epidemiological  information. Fact Sheet for Patients:   BoilerBrush.com.cy Fact Sheet for Healthcare Providers: https://pope.com/ This test is not yet approved or cleared  by the [place] FDA and has been authorized for detection and/or diagnosis of SARS-CoV-2 by FDA under an Emergency Use Authorization (EUA).  This EUA will remain in effect (meaning this test can be used) for the duration of the COVID-19 declaration under Section 564(b)(1) of the Act, 21 U.S.C. section 360bbb-3(b)(1), unless the authorization is terminated or revoked sooner. Performed at [facility], [address], [place], [place] [address]   Lactic acid, plasma     Status: None   Collection Time: 06/11/19  8:03 PM  Result Value Ref Range   Lactic Acid, Venous 1.1 0.5 - 1.9 mmol/L    Comment: Performed at [facility], [address], [place], [place] [address]  Hemoglobin A1c     Status: Abnormal   Collection Time: 06/11/19  9:38 PM  Result Value Ref Range   Hgb A1c MFr Bld 11.0 (H) 4.8 - 5.6 %    Comment: (NOTE) Pre diabetes:          5.7%-6.4% Diabetes:              >6.4% Glycemic control for   <7.0% adults with diabetes    Mean Plasma Glucose 269 mg/dL    Comment: Performed at [facility], [address] [place], [place] [address]    DG Toe Great Right  Result Date: 06/11/2019 CLINICAL DATA:  Tripped and fell 2 weeks ago EXAM: RIGHT GREAT TOE COMPARISON:  04/30/2019 FINDINGS: Frontal, oblique, lateral views of the right great toe are obtained. There is abnormal appearance of the distal margin of the first proximal phalanx with periosteal reaction and overlying soft tissue swelling, consistent with osteomyelitis. Superimposed upon this there is a comminuted fracture of the medial aspect distal margin first proximal phalanx. There are also erosive changes along the dorsal proximal aspect of the first distal phalanx consistent with osteomyelitis and  likely septic arthritis of the first interphalangeal joint. There is diffuse soft tissue edema of the first digit. IMPRESSION: 1. Radiographic evidence of septic arthritis and osteomyelitis surrounding the first interphalangeal joint. 2. Superimposed fracture through the distal medial aspect of the first proximal phalanx. 3. Extensive soft tissue edema. Electronically Signed   By: Sharlet Salina M.D.   On: 06/11/2019 15:35    ROS:  As above.  10 system review is o/w negative PE:  Blood pressure 110/72, pulse 74, temperature 98.2 F (36.8 C), temperature source Oral, resp. rate 18, height 5\' 4"  (1.626 m), weight 74.4 kg, SpO2 100 %. wn wd woman in nad.  A and O.  Respirations unlabored.  Unable to see.  Gait is heel to toe bilat without antalgia.  R hallux with swelling and erythema.  Ulcer present medially.  TTP at the toe.  Diminshed sens to LT at the forefoot.  5/5 strength in PF and DF of the ankle and toes.  1+ dp pulse.  No lymphadenopathy.  Assessment/Plan: R hallux osteomyelitis - I explained the nature of bone infection to the patient in detail.  At this point I believe amputation of the hallux is indicated to eradicate deep infection at the proxima and distal phalanges.  SHe understands the risks and benefits of the alternative treatment options and  elects surgical treatment.  The risks and benefits of the alternative treatment options have been discussed in detail.  The patient wishes to proceed with surgery and specifically understands risks of bleeding, infection, nerve damage, blood clots, need for additional surgery, amputation and death. I'll post her for surgery tomorrow as an add on in the afternoon.  Toni Arthurs 06/11/2019, 10:25 PM
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
## 2019-06-12 ENCOUNTER — Encounter (HOSPITAL_COMMUNITY): Payer: Self-pay

## 2019-06-12 ENCOUNTER — Inpatient Hospital Stay (HOSPITAL_COMMUNITY): Payer: Medicaid Other | Admitting: Anesthesiology

## 2019-06-12 ENCOUNTER — Encounter (HOSPITAL_COMMUNITY): Payer: Self-pay | Admitting: Internal Medicine

## 2019-06-12 ENCOUNTER — Encounter (HOSPITAL_COMMUNITY)
Admission: EM | Disposition: A | Discharge status: Home / Self Care | Payer: Self-pay | Source: Home / Self Care | Attending: Internal Medicine

## 2019-06-12 HISTORY — PX: AMPUTATION: SHX166

## 2019-06-12 LAB — SURGICAL PCR SCREEN
MRSA, PCR: NEGATIVE
Staphylococcus aureus: POSITIVE — AB

## 2019-06-12 LAB — CBC
HCT: 32.6 % — ABNORMAL LOW (ref 36.0–46.0)
Hemoglobin: 10.6 g/dL — ABNORMAL LOW (ref 12.0–15.0)
MCH: 28.4 pg (ref 26.0–34.0)
MCHC: 32.5 g/dL (ref 30.0–36.0)
MCV: 87.4 fL (ref 80.0–100.0)
Platelets: 362 10*3/uL (ref 150–400)
RBC: 3.73 MIL/uL — ABNORMAL LOW (ref 3.87–5.11)
RDW: 12.5 % (ref 11.5–15.5)
WBC: 11.1 10*3/uL — ABNORMAL HIGH (ref 4.0–10.5)
nRBC: 0 % (ref 0.0–0.2)

## 2019-06-12 LAB — BASIC METABOLIC PANEL
Anion gap: 8 (ref 5–15)
BUN: 8 mg/dL (ref 6–20)
CO2: 26 mmol/L (ref 22–32)
Calcium: 8.9 mg/dL (ref 8.9–10.3)
Chloride: 101 mmol/L (ref 98–111)
Creatinine, Ser: 0.76 mg/dL (ref 0.44–1.00)
GFR calc Af Amer: >60 mL/min (ref >60)
GFR calc non Af Amer: >60 mL/min (ref >60)
Glucose, Bld: 203 mg/dL — ABNORMAL HIGH (ref 70–99)
Potassium: 4.7 mmol/L (ref 3.5–5.1)
Sodium: 135 mmol/L (ref 135–145)

## 2019-06-12 LAB — GLUCOSE, CAPILLARY
Glucose-Capillary: 110 mg/dL — ABNORMAL HIGH (ref 70–99)
Glucose-Capillary: 111 mg/dL — ABNORMAL HIGH (ref 70–99)
Glucose-Capillary: 119 mg/dL — ABNORMAL HIGH (ref 70–99)
Glucose-Capillary: 128 mg/dL — ABNORMAL HIGH (ref 70–99)
Glucose-Capillary: 129 mg/dL — ABNORMAL HIGH (ref 70–99)
Glucose-Capillary: 138 mg/dL — ABNORMAL HIGH (ref 70–99)
Glucose-Capillary: 139 mg/dL — ABNORMAL HIGH (ref 70–99)
Glucose-Capillary: 168 mg/dL — ABNORMAL HIGH (ref 70–99)

## 2019-06-12 LAB — LACTIC ACID, PLASMA: Lactic Acid, Venous: 1.3 mmol/L (ref 0.5–1.9)

## 2019-06-12 SURGERY — AMPUTATION, FOOT, RAY
Anesthesia: General | Site: Toe | Laterality: Right

## 2019-06-12 MED ORDER — POLYETHYLENE GLYCOL 3350 17 G PO PACK: 17.0000 g | PACK | Freq: Every day | ORAL | Status: DC | PRN

## 2019-06-12 MED ORDER — BISACODYL 10 MG RE SUPP: 10.0000 mg | Freq: Every day | RECTAL | Status: DC | PRN

## 2019-06-12 MED ORDER — GABAPENTIN 400 MG PO CAPS
1600.0000 mg | ORAL_CAPSULE | Freq: Every day | ORAL | Status: DC
  Administered 2019-06-13: 1600 mg via ORAL
  Filled 2019-06-12: qty 4

## 2019-06-12 MED ORDER — FENTANYL CITRATE (PF) 100 MCG/2ML IJ SOLN: 25.0000 ug | INTRAMUSCULAR | Status: DC | PRN

## 2019-06-12 MED ORDER — CHLORHEXIDINE GLUCONATE 4 % EX LIQD: 60.0000 mL | Freq: Once | CUTANEOUS | Status: DC

## 2019-06-12 MED ORDER — VANCOMYCIN HCL 500 MG IV SOLR
INTRAVENOUS | Status: AC
  Filled 2019-06-12: qty 500

## 2019-06-12 MED ORDER — METOCLOPRAMIDE HCL 5 MG PO TABS: 5.0000 mg | ORAL_TABLET | Freq: Three times a day (TID) | ORAL | Status: DC | PRN

## 2019-06-12 MED ORDER — SODIUM CHLORIDE 0.9 % IR SOLN
Status: DC | PRN
  Administered 2019-06-12: 1

## 2019-06-12 MED ORDER — MAGNESIUM CITRATE PO SOLN: 1.0000 | Freq: Once | ORAL | Status: DC | PRN

## 2019-06-12 MED ORDER — HYDROCODONE-ACETAMINOPHEN 5-325 MG PO TABS
ORAL_TABLET | ORAL | Status: AC
  Filled 2019-06-12: qty 1

## 2019-06-12 MED ORDER — ENSURE MAX PROTEIN PO LIQD
11.0000 [oz_av] | Freq: Every day | ORAL | Status: DC
  Filled 2019-06-12: qty 330

## 2019-06-12 MED ORDER — ADULT MULTIVITAMIN W/MINERALS CH
1.0000 | ORAL_TABLET | Freq: Every day | ORAL | Status: DC
  Administered 2019-06-13: 1 via ORAL
  Filled 2019-06-12: qty 1

## 2019-06-12 MED ORDER — METOCLOPRAMIDE HCL 5 MG/ML IJ SOLN: 5.0000 mg | Freq: Three times a day (TID) | INTRAMUSCULAR | Status: DC | PRN

## 2019-06-12 MED ORDER — LIDOCAINE 2% (20 MG/ML) 5 ML SYRINGE
INTRAMUSCULAR | Status: DC | PRN
  Administered 2019-06-12: 100 mg via INTRAVENOUS

## 2019-06-12 MED ORDER — MIDAZOLAM HCL 5 MG/5ML IJ SOLN
INTRAMUSCULAR | Status: DC | PRN
  Administered 2019-06-12: 2 mg via INTRAVENOUS

## 2019-06-12 MED ORDER — MUPIROCIN 2 % EX OINT
1.0000 "application " | TOPICAL_OINTMENT | Freq: Two times a day (BID) | CUTANEOUS | Status: DC
  Administered 2019-06-12 – 2019-06-13 (×3): 1 via NASAL
  Filled 2019-06-12 (×2): qty 22

## 2019-06-12 MED ORDER — POVIDONE-IODINE 10 % EX SWAB: 2.0000 "application " | Freq: Once | CUTANEOUS | Status: DC

## 2019-06-12 MED ORDER — ONDANSETRON HCL 4 MG/2ML IJ SOLN
INTRAMUSCULAR | Status: DC | PRN
  Administered 2019-06-12: 4 mg via INTRAVENOUS

## 2019-06-12 MED ORDER — FENTANYL CITRATE (PF) 250 MCG/5ML IJ SOLN
INTRAMUSCULAR | Status: AC
  Filled 2019-06-12: qty 5

## 2019-06-12 MED ORDER — FENTANYL CITRATE (PF) 100 MCG/2ML IJ SOLN
INTRAMUSCULAR | Status: DC | PRN
  Administered 2019-06-12 (×3): 50 ug via INTRAVENOUS

## 2019-06-12 MED ORDER — CHLORHEXIDINE GLUCONATE 0.12 % MT SOLN
15.0000 mL | Freq: Once | OROMUCOSAL | Status: AC
  Administered 2019-06-12: 15 mL via OROMUCOSAL
  Filled 2019-06-12: qty 15

## 2019-06-12 MED ORDER — LACTATED RINGERS IV SOLN: INTRAVENOUS | Status: DC

## 2019-06-12 MED ORDER — SODIUM CHLORIDE 0.9 % IV SOLN: INTRAVENOUS | Status: DC

## 2019-06-12 MED ORDER — ONDANSETRON HCL 4 MG/2ML IJ SOLN
INTRAMUSCULAR | Status: AC
  Filled 2019-06-12: qty 2

## 2019-06-12 MED ORDER — VANCOMYCIN HCL 500 MG IV SOLR
INTRAVENOUS | Status: DC | PRN
  Administered 2019-06-12: 500 mg via TOPICAL

## 2019-06-12 MED ORDER — CHLORHEXIDINE GLUCONATE CLOTH 2 % EX PADS
6.0000 | MEDICATED_PAD | Freq: Every day | CUTANEOUS | Status: DC
  Administered 2019-06-12 – 2019-06-13 (×2): 6 via TOPICAL

## 2019-06-12 MED ORDER — KCL IN DEXTROSE-NACL 20-5-0.45 MEQ/L-%-% IV SOLN: INTRAVENOUS | Status: DC

## 2019-06-12 MED ORDER — DOCUSATE SODIUM 100 MG PO CAPS
100.0000 mg | ORAL_CAPSULE | Freq: Two times a day (BID) | ORAL | Status: DC
  Administered 2019-06-12 – 2019-06-13 (×2): 100 mg via ORAL
  Filled 2019-06-12 (×2): qty 1

## 2019-06-12 MED ORDER — MIDAZOLAM HCL 2 MG/2ML IJ SOLN
INTRAMUSCULAR | Status: AC
  Filled 2019-06-12: qty 2

## 2019-06-12 MED ORDER — PROPOFOL 10 MG/ML IV BOLUS
INTRAVENOUS | Status: DC | PRN
  Administered 2019-06-12: 170 mg via INTRAVENOUS

## 2019-06-12 MED ORDER — GLUCERNA SHAKE PO LIQD: 237.0000 mL | Freq: Two times a day (BID) | ORAL | Status: DC

## 2019-06-12 SURGICAL SUPPLY — 45 items
BLADE AVERAGE 25MMX9MM (BLADE) ×1
BLADE AVERAGE 25X9 (BLADE) ×2 IMPLANT
BLADE LONG MED 31MMX9MM (MISCELLANEOUS) ×1
BLADE LONG MED 31X9 (MISCELLANEOUS) ×2 IMPLANT
BNDG COHESIVE 4X5 TAN STRL (GAUZE/BANDAGES/DRESSINGS) ×3 IMPLANT
BNDG COHESIVE 6X5 TAN STRL LF (GAUZE/BANDAGES/DRESSINGS) ×3 IMPLANT
BNDG ESMARK 4X9 LF (GAUZE/BANDAGES/DRESSINGS) ×3 IMPLANT
BNDG GAUZE ELAST 4 BULKY (GAUZE/BANDAGES/DRESSINGS) ×2 IMPLANT
CANISTER SUCT 3000ML PPV (MISCELLANEOUS) ×3 IMPLANT
CHLORAPREP W/TINT 26 (MISCELLANEOUS) ×3 IMPLANT
COVER WAND RF STERILE (DRAPES) ×3 IMPLANT
CUFF TOURN SGL QUICK 34 (TOURNIQUET CUFF)
CUFF TOURN SGL QUICK 42 (TOURNIQUET CUFF) IMPLANT
CUFF TRNQT CYL 34X4.125X (TOURNIQUET CUFF) IMPLANT
DRAPE U-SHAPE 47X51 STRL (DRAPES) ×6 IMPLANT
DRSG MEPITEL 3X4 ME34 (GAUZE/BANDAGES/DRESSINGS) ×2 IMPLANT
DRSG MEPITEL 4X7.2 (GAUZE/BANDAGES/DRESSINGS) ×3 IMPLANT
ELECT REM PT RETURN 9FT ADLT (ELECTROSURGICAL) ×3
ELECTRODE REM PT RTRN 9FT ADLT (ELECTROSURGICAL) ×1 IMPLANT
GAUZE SPONGE 4X4 12PLY STRL (GAUZE/BANDAGES/DRESSINGS) ×3 IMPLANT
GLOVE BIO SURGEON STRL SZ8 (GLOVE) ×6 IMPLANT
GLOVE BIOGEL PI IND STRL 8 (GLOVE) ×2 IMPLANT
GLOVE BIOGEL PI INDICATOR 8 (GLOVE) ×4
GLOVE ECLIPSE 8.0 STRL XLNG CF (GLOVE) ×3 IMPLANT
GOWN STRL REUS W/ TWL LRG LVL3 (GOWN DISPOSABLE) ×1 IMPLANT
GOWN STRL REUS W/ TWL XL LVL3 (GOWN DISPOSABLE) ×2 IMPLANT
GOWN STRL REUS W/TWL LRG LVL3 (GOWN DISPOSABLE) ×2
GOWN STRL REUS W/TWL XL LVL3 (GOWN DISPOSABLE) ×4
KIT BASIN OR (CUSTOM PROCEDURE TRAY) ×3 IMPLANT
KIT TURNOVER KIT B (KITS) ×3 IMPLANT
NS IRRIG 1000ML POUR BTL (IV SOLUTION) ×3 IMPLANT
PACK ORTHO EXTREMITY (CUSTOM PROCEDURE TRAY) ×3 IMPLANT
PAD ABD 8X10 STRL (GAUZE/BANDAGES/DRESSINGS) ×2 IMPLANT
PAD ARMBOARD 7.5X6 YLW CONV (MISCELLANEOUS) ×6 IMPLANT
PAD CAST 4YDX4 CTTN HI CHSV (CAST SUPPLIES) ×1 IMPLANT
PADDING CAST COTTON 4X4 STRL (CAST SUPPLIES) ×2
SPECIMEN JAR SMALL (MISCELLANEOUS) ×3 IMPLANT
SPONGE LAP 18X18 RF (DISPOSABLE) IMPLANT
SUCTION FRAZIER HANDLE 10FR (MISCELLANEOUS)
SUCTION TUBE FRAZIER 10FR DISP (MISCELLANEOUS) IMPLANT
SUT ETHILON 2 0 PSLX (SUTURE) ×3 IMPLANT
TOWEL GREEN STERILE (TOWEL DISPOSABLE) ×3 IMPLANT
TUBE CONNECTING 12'X1/4 (SUCTIONS)
TUBE CONNECTING 12X1/4 (SUCTIONS) IMPLANT
UNDERPAD 30X36 HEAVY ABSORB (UNDERPADS AND DIAPERS) ×3 IMPLANT

## 2019-06-12 NOTE — Transfer of Care (Signed)
Immediate Anesthesia Transfer of Care Note  Patient: [name]  Procedure(s) Performed: AMPUTATION RAY RIGHT GREAT TOE (Right Toe)  Patient Location: PACU  Anesthesia Type:General  Level of Consciousness: awake, alert  and oriented  Airway & Oxygen Therapy: Patient Spontanous Breathing and Patient connected to face mask oxygen  Post-op Assessment: Report given to RN, Post -op Vital signs reviewed and stable and Patient moving all extremities  Post vital signs: Reviewed and stable  Last Vitals:  Vitals Value Taken Time  BP 126/68 06/12/19 1627  Temp 36.2 C 06/12/19 1627  Pulse 65 06/12/19 1630  Resp 11 06/12/19 1630  SpO2 99 % 06/12/19 1630  Vitals shown include unvalidated device data.  Last Pain:  Vitals:   06/12/19 0800  TempSrc:   PainSc: 0-No pain         Complications: No complications documented.

## 2019-06-12 NOTE — Progress Notes (Signed)
Orthopedic Tech Progress Note Patient Details:  PETULA ROTOLO 1970-06-19 [id number]  Ortho Devices Type of Ortho Device: Postop shoe/boot Ortho Device/Splint Location: Lower right extremity Ortho Device/Splint Interventions: Ordered, Application   Post Interventions Patient Tolerated: Well Instructions Provided: Adjustment of device, Care of device, Poper ambulation with device   Kaydenn Mclear P Harle Stanford 06/12/2019, 5:45 PM

## 2019-06-12 NOTE — Op Note (Signed)
06/12/2019  4:25 PM  PATIENT:  [name]  49 y.o. female  PRE-OPERATIVE DIAGNOSIS: Right hallux diabetic ulcer and osteomyelitis  POST-OPERATIVE DIAGNOSIS:   Same  Procedure(s): Right first ray amputation  SURGEON:  Toni Arthurs, MD  ASSISTANT: None  ANESTHESIA:   General  EBL:  minimal   TOURNIQUET: 14 minutes with an ankle Esmarch  COMPLICATIONS:  None apparent  DISPOSITION:  Extubated, awake and stable to recovery.  INDICATION FOR PROCEDURE: The patient is a 49 year old female with a past medical history significant for poorly controlled diabetes.  She has had an ulcer at the right hallux off and on for many months.  She kicked a case of water sitting on the floor yesterday.  She had increasing pain and presented to the emergency room.  Radiographs reveal septic arthritis of the IP joint with osteomyelitis of both the proximal and distal phalanges.  She presents now for right first ray amputation.  The risks and benefits of the alternative treatment options have been discussed in detail.  The patient wishes to proceed with surgery and specifically understands risks of bleeding, infection, nerve damage, blood clots, need for additional surgery, amputation and death.  PROCEDURE IN DETAIL: After preoperative consent was obtained and the correct operative site was identified, the patient was brought to the operating room and placed upon the operating table.  Preoperative antibiotics were administered.  General anesthesia was administered.  Surgical timeout was taken.  The right lower extremity was prepped and draped in standard sterile fashion.  The foot was exsanguinated and a 4 inch Esmarch tourniquet wrapped around the ankle.  A racquet style incision was made around the base of the hallux and extended medially along the first metatarsal.  Sharp dissection was carried down through the subcutaneous tissues to the proximal phalanx.  Subperiosteal dissection was then carried to the  level of the MTP joint.  The toe was disarticulated and passed off the field.  The head of the metatarsal was then exposed.  The oscillating saw was used to cut through the neck of the metatarsal beveling the cut appropriately.  The head was removed.  The wound was irrigated copiously.  Neurovascular bundles were cauterized.  500 mg of vancomycin powder was placed in the wound bed.  Skin incision was closed with horizontal mattress sutures of 2-0 nylon.  Sterile dressings were applied followed by compression wrap.  The tourniquet was released after application of the dressings.  The patient was awakened from anesthesia and transported to the recovery room in stable condition.   FOLLOW UP PLAN: Weightbearing as tolerated in a flat postop shoe.  24 hours of postop antibiotics.  Follow-up in the office in 2 weeks for a wound check.

## 2019-06-12 NOTE — Anesthesia Procedure Notes (Signed)
Procedure Name: LMA Insertion Date/Time: 06/12/2019 3:53 PM Performed by: Colon Flattery, CRNA Pre-anesthesia Checklist: Patient identified, Emergency Drugs available, Suction available and Patient being monitored Patient Re-evaluated:Patient Re-evaluated prior to induction Oxygen Delivery Method: Circle system utilized Preoxygenation: Pre-oxygenation with 100% oxygen Induction Type: IV induction Ventilation: Mask ventilation without difficulty LMA: LMA inserted LMA Size: 4.0 Placement Confirmation: breath sounds checked- equal and bilateral and positive ETCO2 Tube secured with: Tape Dental Injury: Teeth and Oropharynx as per pre-operative assessment

## 2019-06-12 NOTE — Anesthesia Preprocedure Evaluation (Signed)
Anesthesia Evaluation  Patient identified by MRN, date of birth, ID band Patient awake    Airway Mallampati: II  TM Distance: >3 FB     Dental   Pulmonary former smoker,    breath sounds clear to auscultation       Cardiovascular hypertension,  Rhythm:Regular Rate:Normal     Neuro/Psych    GI/Hepatic negative GI ROS, Neg liver ROS,   Endo/Other  diabetes  Renal/GU Renal disease     Musculoskeletal   Abdominal   Peds  Hematology   Anesthesia Other Findings   Reproductive/Obstetrics                             Anesthesia Physical Anesthesia Plan  ASA: III  Anesthesia Plan: General   Post-op Pain Management:    Induction: Intravenous  PONV Risk Score and Plan:   Airway Management Planned: LMA  Additional Equipment:   Intra-op Plan:   Post-operative Plan: Extubation in OR  Informed Consent: I have reviewed the patients History and Physical, chart, labs and discussed the procedure including the risks, benefits and alternatives for the proposed anesthesia with the patient or authorized representative who has indicated his/her understanding and acceptance.     Dental advisory given  Plan Discussed with: CRNA and Anesthesiologist  Anesthesia Plan Comments:         Anesthesia Quick Evaluation

## 2019-06-12 NOTE — Anesthesia Postprocedure Evaluation (Signed)
Anesthesia Post Note  Patient: [name]  Procedure(s) Performed: AMPUTATION RAY RIGHT GREAT TOE (Right Toe)     Patient location during evaluation: PACU Anesthesia Type: General Level of consciousness: awake Pain management: pain level controlled Vital Signs Assessment: post-procedure vital signs reviewed and stable Respiratory status: spontaneous breathing Cardiovascular status: stable Postop Assessment: no apparent nausea or vomiting Anesthetic complications: no   No complications documented.  Last Vitals:  Vitals:   06/12/19 0750 06/12/19 1627  BP: (!) 127/40 126/68  Pulse: 71 65  Resp: 14 10  Temp: 36.6 C (!) 36.2 C  SpO2: 99% 100%    Last Pain:  Vitals:   06/12/19 0800  TempSrc:   PainSc: 0-No pain                 [name]

## 2019-06-12 NOTE — Progress Notes (Signed)
Inpatient Diabetes Program Recommendations  AACE/ADA: New Consensus Statement on Inpatient Glycemic Control   Target Ranges:  Prepandial:   less than 140 mg/dL      Peak postprandial:   less than 180 mg/dL (1-2 hours)      Critically ill patients:  140 - 180 mg/dL   Results for [name], [name] (MRN [id number]) as of 06/12/2019 09:06  Ref. Range 06/11/2019 23:14 06/12/2019 03:58 06/12/2019 07:51  Glucose-Capillary Latest Ref Range: 70 - 99 mg/dL 218 (H) 110 (H) 139 (H)  Results for [name], [name] (MRN [id number]) as of 06/12/2019 09:06  Ref. Range 02/19/2019 11:24 02/25/2019 01:33 05/19/2019 11:19 05/19/2019 11:19 06/11/2019 21:38  Hemoglobin A1C Latest Ref Range: 4.8 - 5.6 % 9.8 (A) 9.3 (H) 10.8 10.8 (A) 11.0 (H)   Review of Glycemic Control  Outpatient Diabetes medications: 70/30 20 units BID, Metformin 1000 mg BID Current orders for Inpatient glycemic control: NPH 7 units BID, Novolog 0-9 units Q4H  Inpatient Diabetes Program Recommendations:    HbgA1C: A1C 11% on 06/11/19 indicating an average glucose of 269 mg/dl over the past 2-3 months.  Lab: May want to order c-peptide to determine how much insulin patient is able to make. If c-peptide is normal, may want to try other oral DM medications (that don't cause weight gain) since patient is not taking insulin due to side effect of weight gain.  NOTE: In reviewing chart, noted patient was seen by Corky Downs, NP at [facility] on 05/19/19. Per office note on 05/19/19 "Discontinued insulin which has been discontinued because of side effects. She states that she has a love hate relationship with insulin. She does not use her insulin because she does not want to gain weight. She states " She would rather die skinny than use insulin and gaine weight." She has lost 40 pounds since December and she is not eating because of gastroparesis." Initial glucose 277 mg/dl on 06/11/19, CO2 24 and AG 11.  Diabetes Coordinator working remotely today.  Called patient over the phone regarding DM control. Patient states that she is prescribed 70/30 "30 units BID I think. I rarely take it" and Metformin 1000 mg BID. When asked why she rarely takes the 70/30, patient stated "It is a physiological thing. I don't take the insulin because I don't want to gain weight." Patient confirms that she already has several complications from poor DM control (gastroparesis, poor vision, neuropathy - numbness from knees down, foot ulcer, and currently has osteomyelitis of great toe of right foot). Discussed basic metabolism and explained that obviously her pancreas is not able to make enough insulin for normal glucose metabolism or to keep glucose controlled. Explained how hyperglycemia damages inner lining of blood vessels causing complications. Also discussed how hyperglycemia can cause neuropathy. Explained that it was noted she has lost 40 pounds since December which is partially due to not taking insulin and causing the body to breakdown muscle and fat tissue to try to get the glucose it needs. Patient stated the reason she has lost weight is due to her gastroparesis. Acknowledged that the gastroparesis has contributed to that but the cells are not able to use the glucose in her blood vessels because there is not enough insulin available for normal glucose metabolism to occur. Stressed that she is going to end up with more health complications from uncontrolled DM if she does not get DM under better control. Patient then stated that the doctor just came in so I could  call back to finish our conversation. Have tried multiple times since then to call patient back but no answer.  Thanks, Orlando Penner, RN, MSN, CDE Diabetes Coordinator Inpatient Diabetes Program [phone] (Team Pager)

## 2019-06-12 NOTE — Interval H&P Note (Signed)
History and Physical Interval Note:  06/12/2019 3:41 PM  [name]  has presented today for surgery, with the diagnosis of Osteomyelitis right great toe.  The various methods of treatment have been discussed with the patient and family. After consideration of risks, benefits and other options for treatment, the patient has consented to  Procedure(s): AMPUTATION RAY RIGHT GREAT TOE (Right) as a surgical intervention.  The patient's history has been reviewed, patient examined, no change in status, stable for surgery.  I have reviewed the patient's chart and labs.  Questions were answered to the patient's satisfaction.    The risks and benefits of the alternative treatment options have been discussed in detail.  The patient wishes to proceed with surgery and specifically understands risks of bleeding, infection, nerve damage, blood clots, need for additional surgery, amputation and death.  Toni Arthurs

## 2019-06-12 NOTE — Plan of Care (Signed)
°  Problem: Education: °Goal: Knowledge of General Education information will improve °Description: Including pain rating scale, medication(s)/side effects and non-pharmacologic comfort measures °Outcome: Progressing °  °Problem: Clinical Measurements: °Goal: Will remain free from infection °Outcome: Progressing °  °Problem: Activity: °Goal: Risk for activity intolerance will decrease °Outcome: Progressing °  °Problem: Pain Managment: °Goal: General experience of comfort will improve °Outcome: Progressing °  °Problem: Skin Integrity: °Goal: Risk for impaired skin integrity will decrease °Outcome: Progressing °  °

## 2019-06-12 NOTE — Plan of Care (Signed)

## 2019-06-12 NOTE — Progress Notes (Signed)
Initial Nutrition Assessment  DOCUMENTATION CODES:   Not applicable  INTERVENTION:   -Once diet advances:  -MVI with minerals daily -Magic cup TID with meals, each supplement provides 290 kcal and 9 grams of protein -Glucerna Shake po BID, each supplement provides 220 kcal and 10 grams of protein -Ensure Max po daily, each supplement provides 150 kcal and 30 grams of protein.   NUTRITION DIAGNOSIS:   Increased nutrient needs related to post-op healing as evidenced by estimated needs.  GOAL:   Patient will meet greater than or equal to 90% of their needs  MONITOR:   PO intake, Supplement acceptance, Diet advancement, Labs, Weight trends, Skin, I & O's  REASON FOR ASSESSMENT:   Consult Wound healing  ASSESSMENT:   [name] is a 49 y.o. female with medical history significant of DM2 poorly controlled, blindness, anxiety, depression.  Pt admitted with rt hallux osteomyelitis.   Reviewed I/O's: +394 ml x 24 hours  UOP: 0 ml x 24 hours  Per orthopedics notes, plan for hallux amputation today. Pt is NPO for procedure.   Of note, pt is blind. She reports she has "very bad nutrition- I pretty much live on coffee". Pt explains that she was diagnosed with gastroparesis back in October 2020 and diet mainly consists of coffee (hot and iced). She will eat solid food once a day, which is usually Pop Tarts. She denies snacking or consuming other beverages throughout the day.   Due to uncontrolled DM and poor oral intake, pt estimates she has lost about 50# over the past 6 months. Per pt, UBW is around 260#, which she last weighed in October 2020. Reviewed wt hx; pt has experienced a 19% wt loss over the past year. Suspect uncontrolled DM is contributing to weight loss.   Pt reports a long standing history of uncontrolled diabetes. Per her report, she has been on insulin since 1997, but has never been consistent in taking insulin. She states "I have to get my mind right; it's a  psychological thing. I can't get my mind to take the insulin". Pt denies any difficulty affording medications and supplies. Unsure of pt's social situation or degree of assistance in her home. RD had a frank discussion with pt regarding importance of self-management and taking medications to prevent further complications and to optimize post-operative healing. Pt acknowledges that she is receiving an amputation today and did not feel distressed about it.   Case discussed with DM coordinator, who saw pt earlier today and had a similar experience. Expressed concerns about pt not taking insulin and possible lack of support at home, especially after amputation. Pt has expressed multiple falls at home, including tripping over a case of water due to visual impairment, which caused rt toe infection. DM coordinator agrees that transitions of care team should be involved (consult pending).   Medications reviewed and include dextrose 5% and 0.45% NaCl with KCl 20 mEq/L infusion @ 50 ml/hr.   Lab Results  Component Value Date   HGBA1C 11.0 (H) 06/11/2019   PTA DM medications are 20 units insulin aspart-protamine aspart BID and 1000 mg metformin BID.   Labs reviewed: CBGS: 218 (inpatient orders for glycemic control are 0-9 units insulin aspart every 4 hours and 7 units insulin NPH BID).   NUTRITION - FOCUSED PHYSICAL EXAM:    Most Recent Value  Orbital Region No depletion  Upper Arm Region No depletion  Thoracic and Lumbar Region No depletion  Buccal Region No depletion  Temple  Region No depletion  Clavicle Bone Region No depletion  Clavicle and Acromion Bone Region No depletion  Scapular Bone Region No depletion  Dorsal Hand No depletion  Patellar Region Moderate depletion  Anterior Thigh Region Moderate depletion  Posterior Calf Region Moderate depletion  Edema (RD Assessment) None  Hair Reviewed  Eyes Reviewed  Mouth Reviewed  Skin Reviewed  Nails Reviewed       Diet Order:   Diet Order             Diet NPO time specified Except for: Sips with Meds  Diet effective midnight                 EDUCATION NEEDS:   Education needs have been addressed  Skin:  Skin Assessment: Reviewed RN Assessment  Last BM:  06/11/19  Height:   Ht Readings from Last 1 Encounters:  06/12/19 5' 4.02" (1.626 m)    Weight:   Wt Readings from Last 1 Encounters:  06/12/19 76.2 kg    Ideal Body Weight:  54.5 kg  BMI:  Body mass index is 28.82 kg/m.  Estimated Nutritional Needs:   Kcal:  1900-2100  Protein:  105-110 grams  Fluid:  > 1.9 L    Levada Schilling, RD, LDN, CDCES Registered Dietitian II Certified Diabetes Care and Education Specialist Please refer to [facility] for RD and/or RD on-call/weekend/after hours pager

## 2019-06-12 NOTE — Progress Notes (Signed)
PROGRESS NOTE    [name]  GYI:[id number] DOB: 1970-06-03 DOA: 06/11/2019 PCP: Barbette Merino, NP  Brief Narrative: [name] is a 49 y.o. female with medical history significant of DM2 poorly controlled, blindness, anxiety, depression. Pt with h/o diabetic foot ulcer of R great toe in past.  Grew pseudomonas in 2020.  Had cellulitis in Feb 2021 and again in April 2021.  Was put on Keflex and bactrim most recently in April, finished course. -h/o pain and swelling for few days, 6/9 tripped on case of water on ground secondary to blindness, worsening pain and came to ED. -ED Course: Xray notes osteomyelitis, cellulitis, and septic arthritis of the R great toe.  Superimposed on this she also has a fracture of the right great toe.  Pt started on zosyn/vanc in ED   Assessment & Plan:    Osteomyelitis of great toe of R foot/diabetic foot infection - -continue vancomycin and cefepime -Orthopedics consulting -Plan for surgery, toe amputation this afternoon  Diabetes mellitus, unclear if she is type I or type II -Stopped taking insulin at home due to weight gain, currently on Metformin only -Hemoglobin A1c is 11, indicating poor long-term control -Diabetes coordinator consult, continue insulin inpatient CBGs are stable this morning -Continue current dose of NPH  Blindness/diabetic retinopathy -Follow-up with ophthalmology   DVT prophylaxis: SCDs Code Status: Full Family Communication: Discussed with patient in detail, no family at bedside Disposition Plan:  Status is: Inpatient  Remains inpatient appropriate because:IV treatments appropriate due to intensity of illness or inability to take PO and Inpatient level of care appropriate due to severity of illness   Dispo: The patient is from: Home              Anticipated d/c is to: Home              Anticipated d/c date is: 2 days              Patient currently is not medically stable to d/c.  Consultants:    Orthopedics   Procedures:   Antimicrobials:    Subjective: -Feels okay, has mild discomfort in her right great toe  Objective: Vitals:   06/12/19 0006 06/12/19 0013 06/12/19 0442 06/12/19 0750  BP: 123/62  110/74 (!) 127/40  Pulse: 67  66 71  Resp: 16  16 14   Temp: 98.3 F (36.8 C)  98 F (36.7 C) 97.9 F (36.6 C)  TempSrc: Oral  Oral Oral  SpO2: 100%  100% 99%  Weight:  76.2 kg    Height:  5\' 4"  (1.626 m)      Intake/Output Summary (Last 24 hours) at 06/12/2019 1137 Last data filed at 06/12/2019 0900 Gross per 24 hour  Intake 394.17 ml  Output 0 ml  Net 394.17 ml   Filed Weights   06/11/19 2100 06/12/19 0013  Weight: 74.4 kg 76.2 kg    Examination:  General exam: Pleasant female sitting up in bed, AAOx3, no distress  respiratory system: Clear to auscultation. Respiratory effort normal. Cardiovascular system: S1 & S2 heard, RRR. Gastrointestinal system: Abdomen is nondistended, soft and nontender.Normal bowel sounds heard. Central nervous system: Alert and oriented. No focal neurological deficits. Extremities: Right great toe with swelling erythema and tenderness, intact dorsalis pedis and PT pulses Skin: As above Psychiatry: Judgement and insight appear normal. Mood & affect appropriate.     Data Reviewed:   CBC: Recent Labs  Lab 06/11/19 1942 06/12/19 0156  WBC 12.3* 11.1*  NEUTROABS  8.0*  --   HGB 11.7* 10.6*  HCT 36.1 32.6*  MCV 87.8 87.4  PLT 426* 362   Basic Metabolic Panel: Recent Labs  Lab 06/11/19 1942 06/12/19 0156  NA 133* 135  K 4.9 4.7  CL 98 101  CO2 24 26  GLUCOSE 277* 203*  BUN 10 8  CREATININE 0.80 0.76  CALCIUM 9.3 8.9   GFR: Estimated Creatinine Clearance: 87.8 mL/min (by C-G formula based on SCr of 0.76 mg/dL). Liver Function Tests: Recent Labs  Lab 06/11/19 1942  AST 11*  ALT 12  ALKPHOS 76  BILITOT 0.5  PROT 7.6  ALBUMIN 3.2*   No results for input(s): LIPASE, AMYLASE in the last 168 hours. No  results for input(s): AMMONIA in the last 168 hours. Coagulation Profile: No results for input(s): INR, PROTIME in the last 168 hours. Cardiac Enzymes: No results for input(s): CKTOTAL, CKMB, CKMBINDEX, TROPONINI in the last 168 hours. BNP (last 3 results) No results for input(s): PROBNP in the last 8760 hours. HbA1C: Recent Labs    06/11/19 2138  HGBA1C 11.0*   CBG: Recent Labs  Lab 06/11/19 2314 06/12/19 0358 06/12/19 0751  GLUCAP 218* 110* 139*   Lipid Profile: No results for input(s): CHOL, HDL, LDLCALC, TRIG, CHOLHDL, LDLDIRECT in the last 72 hours. Thyroid Function Tests: No results for input(s): TSH, T4TOTAL, FREET4, T3FREE, THYROIDAB in the last 72 hours. Anemia Panel: No results for input(s): VITAMINB12, FOLATE, FERRITIN, TIBC, IRON, RETICCTPCT in the last 72 hours. Urine analysis:    Component Value Date/Time   COLORURINE YELLOW 06/07/2017 1226   APPEARANCEUR CLEAR 06/07/2017 1226   LABSPEC 1.032 (H) 06/07/2017 1226   PHURINE 5.0 06/07/2017 1226   GLUCOSEU >=500 (A) 06/07/2017 1226   HGBUR NEGATIVE 06/07/2017 1226   BILIRUBINUR neg 05/19/2019 1218   KETONESUR 80 (A) 06/07/2017 1226   PROTEINUR Negative 05/19/2019 1218   PROTEINUR 30 (A) 06/07/2017 1226   UROBILINOGEN 0.2 05/19/2019 1218   UROBILINOGEN 0.2 11/28/2013 0023   NITRITE neg 05/19/2019 1218   NITRITE NEGATIVE 06/07/2017 1226   LEUKOCYTESUR Negative 05/19/2019 1218   Sepsis Labs: @LABRCNTIP (procalcitonin:4,lacticidven:4)  ) Recent Results (from the past 240 hour(s))  SARS Coronavirus 2 by RT PCR (hospital order, performed in [facility] hospital lab) Nasopharyngeal Nasopharyngeal Swab     Status: None   Collection Time: 06/11/19  7:50 PM   Specimen: Nasopharyngeal Swab  Result Value Ref Range Status   SARS Coronavirus 2 NEGATIVE NEGATIVE Final    Comment: (NOTE) SARS-CoV-2 target nucleic acids are NOT DETECTED. The SARS-CoV-2 RNA is generally detectable in upper and lower respiratory  specimens during the acute phase of infection. The lowest concentration of SARS-CoV-2 viral copies this assay can detect is 250 copies / mL. A negative result does not preclude SARS-CoV-2 infection and should not be used as the sole basis for treatment or other patient management decisions.  A negative result may occur with improper specimen collection / handling, submission of specimen other than nasopharyngeal swab, presence of viral mutation(s) within the areas targeted by this assay, and inadequate number of viral copies (<250 copies / mL). A negative result must be combined with clinical observations, patient history, and epidemiological information. Fact Sheet for Patients:   08/11/19 Fact Sheet for Healthcare Providers: BoilerBrush.com.cy This test is not yet approved or cleared  by the https://pope.com/ FDA and has been authorized for detection and/or diagnosis of SARS-CoV-2 by FDA under an Emergency Use Authorization (EUA).  This EUA will remain in  effect (meaning this test can be used) for the duration of the COVID-19 declaration under Section 564(b)(1) of the Act, 21 U.S.C. section 360bbb-3(b)(1), unless the authorization is terminated or revoked sooner. Performed at [facility], [place] [address], [place], [place] [address]   Blood Cultures x 2 sites     Status: None (Preliminary result)   Collection Time: 06/11/19  9:38 PM   Specimen: BLOOD  Result Value Ref Range Status   Specimen Description BLOOD RIGHT ANTECUBITAL  Final   Special Requests   Final    BOTTLES DRAWN AEROBIC AND ANAEROBIC Blood Culture adequate volume PATIENT ON FOLLOWING VANCO ZOSYN   Culture   Final    NO GROWTH < 12 HOURS Performed at [facility], [place] [address], [place], [place] [address]    Report Status PENDING  Incomplete  Blood Cultures x 2 sites     Status: None (Preliminary result)   Collection Time: 06/11/19  9:38 PM    Specimen: BLOOD  Result Value Ref Range Status   Specimen Description BLOOD LEFT ANTECUBITAL  Final   Special Requests   Final    BOTTLES DRAWN AEROBIC AND ANAEROBIC Blood Culture adequate volume PATIENT ON FOLLOWING VANCO ZOSYN   Culture   Final    NO GROWTH < 12 HOURS Performed at [facility], [place] [address], [place], [place] [address]    Report Status PENDING  Incomplete  Surgical pcr screen     Status: Abnormal   Collection Time: 06/12/19 12:04 AM   Specimen: Nasal Mucosa; Nasal Swab  Result Value Ref Range Status   MRSA, PCR NEGATIVE NEGATIVE Final   Staphylococcus aureus POSITIVE (A) NEGATIVE Final    Comment: (NOTE) The Xpert SA Assay (FDA approved for NASAL specimens in patients 2 years of age and older), is one component of a comprehensive surveillance program. It is not intended to diagnose infection nor to guide or monitor treatment. Performed at [facility], [place] [address], [place], [place] [address]          Radiology Studies: DG Toe Great Right  Result Date: 06/11/2019 CLINICAL DATA:  Tripped and fell 2 weeks ago EXAM: RIGHT GREAT TOE COMPARISON:  04/30/2019 FINDINGS: Frontal, oblique, lateral views of the right great toe are obtained. There is abnormal appearance of the distal margin of the first proximal phalanx with periosteal reaction and overlying soft tissue swelling, consistent with osteomyelitis. Superimposed upon this there is a comminuted fracture of the medial aspect distal margin first proximal phalanx. There are also erosive changes along the dorsal proximal aspect of the first distal phalanx consistent with osteomyelitis and likely septic arthritis of the first interphalangeal joint. There is diffuse soft tissue edema of the first digit. IMPRESSION: 1. Radiographic evidence of septic arthritis and osteomyelitis surrounding the first interphalangeal joint. 2. Superimposed fracture through the distal medial aspect of the first proximal phalanx.  3. Extensive soft tissue edema. Electronically Signed   By: Randa Ngo M.D.   On: 06/11/2019 15:35        Scheduled Meds: . atorvastatin  40 mg Oral q morning - 10a  . chlorhexidine  60 mL Topical Once  . Chlorhexidine Gluconate Cloth  6 each Topical Daily  . famotidine  20 mg Oral BID  . FLUoxetine  80 mg Oral q morning - 10a  . gabapentin  1,600 mg Oral Daily  . gabapentin  2,400 mg Oral QHS  . insulin aspart  0-9 Units Subcutaneous  Q4H  . insulin NPH Human  7 Units Subcutaneous BID AC & HS  . metroNIDAZOLE  500 mg Oral Q8H  . mupirocin ointment  1 application Nasal BID  . povidone-iodine  2 application Topical Once  . spironolactone  100 mg Oral Daily   Continuous Infusions: . ceFEPime (MAXIPIME) IV 2 g (06/12/19 0854)  . vancomycin       LOS: 1 day    Time spent:37min  Zannie Cove, MD [facility]  06/12/2019, 11:37 AM

## 2019-06-12 NOTE — Progress Notes (Signed)
Received pt from PACU accompanied by staff,Pt alert/oriented in no acute distress. No complaints voiced.

## 2019-06-13 ENCOUNTER — Encounter (HOSPITAL_COMMUNITY): Payer: Self-pay | Admitting: Orthopedic Surgery

## 2019-06-13 LAB — CBC
HCT: 31.5 % — ABNORMAL LOW (ref 36.0–46.0)
Hemoglobin: 10 g/dL — ABNORMAL LOW (ref 12.0–15.0)
MCH: 28.2 pg (ref 26.0–34.0)
MCHC: 31.7 g/dL (ref 30.0–36.0)
MCV: 88.7 fL (ref 80.0–100.0)
Platelets: 319 10*3/uL (ref 150–400)
RBC: 3.55 MIL/uL — ABNORMAL LOW (ref 3.87–5.11)
RDW: 12.4 % (ref 11.5–15.5)
WBC: 10.4 10*3/uL (ref 4.0–10.5)
nRBC: 0 % (ref 0.0–0.2)

## 2019-06-13 LAB — C-PEPTIDE: C-Peptide: 1 ng/mL — ABNORMAL LOW (ref 1.1–4.4)

## 2019-06-13 LAB — BASIC METABOLIC PANEL
Anion gap: 9 (ref 5–15)
BUN: 6 mg/dL (ref 6–20)
CO2: 26 mmol/L (ref 22–32)
Calcium: 8.6 mg/dL — ABNORMAL LOW (ref 8.9–10.3)
Chloride: 103 mmol/L (ref 98–111)
Creatinine, Ser: 0.77 mg/dL (ref 0.44–1.00)
GFR calc Af Amer: >60 mL/min (ref >60)
GFR calc non Af Amer: >60 mL/min (ref >60)
Glucose, Bld: 102 mg/dL — ABNORMAL HIGH (ref 70–99)
Potassium: 3.8 mmol/L (ref 3.5–5.1)
Sodium: 138 mmol/L (ref 135–145)

## 2019-06-13 LAB — GLUCOSE, CAPILLARY
Glucose-Capillary: 81 mg/dL (ref 70–99)
Glucose-Capillary: 89 mg/dL (ref 70–99)
Glucose-Capillary: 120 mg/dL — ABNORMAL HIGH (ref 70–99)
Glucose-Capillary: 150 mg/dL — ABNORMAL HIGH (ref 70–99)

## 2019-06-13 MED ORDER — "INSULIN SYRINGE 30G X 1/2"" 0.5 ML MISC": 1.0000 | Freq: Two times a day (BID) | 1 refills | Status: DC

## 2019-06-13 MED ORDER — INSULIN ASPART PROT & ASPART (70-30 MIX) 100 UNIT/ML ~~LOC~~ SUSP: 7.0000 [IU] | Freq: Two times a day (BID) | SUBCUTANEOUS | 5 refills | Status: DC

## 2019-06-13 MED ORDER — HYDROCODONE-ACETAMINOPHEN 5-325 MG PO TABS: 1.0000 | ORAL_TABLET | Freq: Four times a day (QID) | ORAL | 0 refills | Status: DC | PRN

## 2019-06-13 MED ORDER — ACETAMINOPHEN 325 MG PO TABS: 650.0000 mg | ORAL_TABLET | Freq: Four times a day (QID) | ORAL | Status: DC | PRN

## 2019-06-13 NOTE — TOC Transition Note (Signed)
Transition of Care [facility]) - CM/SW Discharge Note   Patient Details  Name: [name] MRN: [id number] Date of Birth: 25-Nov-1970  [facility] CM/SW Contact:  Epifanio Lesches, RN Phone Number: 06/13/2019, 12:10 PM   Clinical Narrative:  Presented with diabetic foot ulcer of R great toe. Hx of DM , blindness, anxiety, depression.  Pt will transition to home with family. Pt without TOC needs. Pt without Rx med concerns or affordability. No DME needs. Post hospital f/u noted on AVS. Pt with transportation to home.  Haly Feher (Spouse)     [phone]       Final next level of care: Home/Self Care Barriers to Discharge: No Barriers Identified   Patient Goals and CMS Choice        Discharge Placement                       Discharge Plan and Services                                     Social Determinants of Health (SDOH) Interventions     Readmission Risk Interventions No flowsheet data found.

## 2019-06-13 NOTE — Discharge Instructions (Addendum)
[name] Hewitt, MD [facility]  Please read the following information regarding your care after surgery.  Medications  You only need a prescription for the narcotic pain medicine (ex. oxycodone, Percocet, Norco).  All of the other medicines listed below are available over the counter. X Aleve 2 pills twice a day for the first 3 days after surgery. X acetominophen (Tylenol) 650 mg every 4-6 hours as you need for minor to moderate pain   Weight Bearing X Bear weight only on your operated foot in the post-op shoe.   Cast / Splint / Dressing X Keep your splint, cast or dressing clean and dry.  Don't put anything (coat hanger, pencil, etc) down inside of it.  If it gets damp, use a hair dryer on the cool setting to dry it.  If it gets soaked, call the office to schedule an appointment for a cast change.   After your dressing, cast or splint is removed; you may shower, but do not soak or scrub the wound.  Allow the water to run over it, and then gently pat it dry.  Swelling It is normal for you to have swelling where you had surgery.  To reduce swelling and pain, keep your toes above your nose for at least 3 days after surgery.  It may be necessary to keep your foot or leg elevated for several weeks.  If it hurts, it should be elevated.  Follow Up Call my office at [phone] when you are discharged from the hospital or surgery center to schedule an appointment to be seen two weeks after surgery.  Call my office at [phone] if you develop a fever >101.5 F, nausea, vomiting, bleeding from the surgical site or severe pain.     

## 2019-06-13 NOTE — Progress Notes (Signed)
Subjective: 1 Day Post-Op Procedure(s) (LRB): AMPUTATION RAY RIGHT GREAT TOE (Right)  Patient reports pain as relatively non existent due to her peripheral neuropathy.  Denies fever, chills, N/V, CP, SOB.  States that she received her post-op shoe.  Resting comfortably in bed.  Admits to flatus.  States that she wants to go home today.  Objective:   VITALS:  Temp:  [97 F (36.1 C)-98.7 F (37.1 C)] 97.9 F (36.6 C) (06/11 0356) Pulse Rate:  [65-78] 78 (06/11 0356) Resp:  [10-18] 17 (06/11 0356) BP: (104-147)/(40-91) 104/66 (06/11 0356) SpO2:  [94 %-100 %] 100 % (06/11 0356) FiO2 (%):  [21 %] 21 % (06/10 1731) Weight:  [76.2 kg] 76.2 kg (06/10 1455)  General: WDWN patient in NAD. Psych:  Appropriate mood and affect. Neuro:  A&O x 3, Moving all extremities, sensation intact to light touch HEENT:  EOMs intact Chest:  Even non-labored respirations Skin:  Dressing C/D/I, no rashes or lesions Extremities: warm/dry, no visible edema, erythema or echymosis.  No lymphadenopathy. Pulses: Popliteus 2+ MSK:  ROM: full ankle ROM, MMT: able to perform quad set    LABS Recent Labs    06/11/19 1942 06/11/19 1942 06/12/19 0156 06/13/19 0439  HGB 11.7*  --  10.6* 10.0*  WBC 12.3*   < > 11.1* 10.4  PLT 426*   < > 362 319   < > = values in this interval not displayed.   Recent Labs    06/11/19 1942 06/12/19 0156  NA 133* 135  K 4.9 4.7  CL 98 101  CO2 24 26  BUN 10 8  CREATININE 0.80 0.76  GLUCOSE 277* 203*   No results for input(s): LABPT, INR in the last 72 hours.   Assessment/Plan: 1 Day Post-Op Procedure(s) (LRB): AMPUTATION RAY RIGHT GREAT TOE (Right)  WBAT R LE in flat post-op shoe.  Tylenol and aleve for post-op pain control. D/C home with stable per medicine team. Plan for 2 week outpatient post-op visit with Dr. Victorino Dike.  Alfredo Martinez PA-C [facility] Office:  [phone]

## 2019-06-13 NOTE — Progress Notes (Signed)
[name] to be discharged home per MD order.  Discussed prescriptions, post op care, and follow up appointments with the patient. Prescriptions sent to patient's preferred pharmacy, medication list explained in detail. Pt verbalized understanding.  Allergies as of 06/13/2019      Reactions   Tramadol Other (See Comments)   Unknown; pt claims she doesn't recall having this allergy      Medication List    TAKE these medications   acetaminophen 325 MG tablet Commonly known as: TYLENOL Take 2 tablets (650 mg total) by mouth every 6 (six) hours as needed for mild pain (or Fever >/= 101).   ALPRAZolam 1 MG tablet Commonly known as: XANAX Take 1 tablet (1 mg total) by mouth at bedtime as needed for anxiety.   amphetamine-dextroamphetamine 30 MG tablet Commonly known as: ADDERALL Take 1 tablet by mouth daily.   aspirin EC 81 MG tablet Take 1 tablet (81 mg total) by mouth 2 (two) times daily.   atorvastatin 40 MG tablet Commonly known as: LIPITOR Take 1 tablet (40 mg total) by mouth every evening. What changed: when to take this   DRY EYES OP Place 1-2 drops into both eyes daily as needed (dry eyes).   famotidine 20 MG tablet Commonly known as: PEPCID Take 1 tablet by mouth twice daily   FLUoxetine 40 MG capsule Commonly known as: PROZAC Take 2 capsules (80 mg total) by mouth every morning.   gabapentin 800 MG tablet Commonly known as: NEURONTIN TAKE 2 TABLETS BY MOUTH IN THE MORNING AND 3 TABLETS IN THE EVENING What changed:   how much to take  how to take this  when to take this  additional instructions   gentamicin cream 0.1 % Commonly known as: GARAMYCIN Apply 1 application topically 3 (three) times daily.   glucose blood test strip Commonly known as: ONE TOUCH ULTRA TEST 1 each by Other route 2 (two) times daily. And lancets 2/day.   HYDROcodone-acetaminophen 5-325 MG tablet Commonly known as: NORCO/VICODIN Take 1 tablet by mouth every 6 (six) hours  as needed for severe pain.   insulin aspart protamine- aspart (70-30) 100 UNIT/ML injection Commonly known as: NOVOLOG MIX 70/30 Inject 0.07 mLs (7 Units total) into the skin 2 (two) times daily with a meal. What changed: how much to take   INSULIN SYRINGE .5CC/30GX1/2" 30G X 1/2" 0.5 ML Misc 1 each by Does not apply route every 12 (twelve) hours.   metFORMIN 1000 MG tablet Commonly known as: GLUCOPHAGE Take 1 tablet (1,000 mg total) by mouth 2 (two) times daily with a meal.   MULTIVITAMIN GUMMIES ADULT PO Take 1 capsule by mouth daily.   spironolactone 100 MG tablet Commonly known as: ALDACTONE Take 1 tablet by mouth once daily            Discharge Care Instructions  (From admission, onward)         Start     Ordered   06/13/19 0000  Discharge wound care:       Comments: See above   06/13/19 1018          Vitals:   06/13/19 0356 06/13/19 0801  BP: 104/66 119/69  Pulse: 78 77  Resp: 17 14  Temp: 97.9 F (36.6 C) 98.2 F (36.8 C)  SpO2: 100% 99%    IV catheters discontinued, Dressing and pressure applied. Pt denies pain at this time. No complaints noted.  An After Visit Summary was printed and given to the patient.  Patient escorted via Wheelchair, and Discharged home via private auto with husband.  [name] [facility] 06/13/2019 1:01 PM

## 2019-06-14 NOTE — Discharge Summary (Signed)
Physician Discharge Summary  [name] OZD:[id number] DOB: 1970-12-18 DOA: 06/11/2019  PCP: Barbette Merino, NP  Admit date: 06/11/2019 Discharge date: 06/13/2019  Time spent: 35 minutes  Recommendations for Outpatient Follow-up:  Dr. Victorino Dike in 2 weeks PCP in 1 week  Discharge Diagnoses:  Principal Problem:   Osteomyelitis of great toe of right foot [facility]) Active Problems:   Diabetic foot infection (HCC)   Uncontrolled diabetes mellitus due to underlying condition with diabetic retinopathy (HCC)   Blindness   Fracture of right great toe   Discharge Condition: Stable  Diet recommendation: Diabetic Filed Weights   06/11/19 2100 06/12/19 0013 06/12/19 1455  Weight: 74.4 kg 76.2 kg 76.2 kg    History of present illness:  [name] a 50 y.o.femalewith medical history significant ofDM2poorly controlled, blindness, anxiety, depression. Pt with h/o diabetic foot ulcer of R great toe in past. Grew pseudomonas in 2020. Had cellulitis in Feb 2021 and again in April 2021. Was put on Keflex and bactrim most recently in April, finished course. -h/o pain and swelling for few days, 6/9 tripped on case of water on ground secondary to blindness, worsening pain and came to ED. -ED Course:Xray notes osteomyelitis, cellulitis, and septic arthritis of the R great toe. Superimposed on this she also has a fracture of the right great toe.   Hospital Course:   Osteomyelitis of great toe of R foot/diabetic foot infection - -treated with IV antibiotics, pulses were intact, orthopedics consulted -Underwent first ray amputation by Dr. Victorino Dike yesterday -Clinically stable postop -Cleared by Ortho to discharge home today -Follow-up with Dr. Victorino Dike in 2 weeks  Diabetes mellitus, unclear if she is type I or type II -Stopped taking insulin at home due to weight gain, currently on Metformin only -Hemoglobin A1c is 11, indicating poor long-term control -Diabetes coordinator  consulted, counseled regarding importance of compliance with insulin since she may be a type I diabetic  Blindness/diabetic retinopathy -Follow-up with ophthalmology    Procedures: PRE-OPERATIVE DIAGNOSIS:        Right hallux diabetic ulcer and osteomyelitis  POST-OPERATIVE DIAGNOSIS:      Same  Procedure(s): Right first ray amputation  SURGEON:  Toni Arthurs, MD  Consultations:  Ortho Dr.Hewitt  Discharge Exam: Vitals:   06/13/19 0356 06/13/19 0801  BP: 104/66 119/69  Pulse: 78 77  Resp: 17 14  Temp: 97.9 F (36.6 C) 98.2 F (36.8 C)  SpO2: 100% 99%    General:AAOx3 Cardiovascular:S1S2/RRR Respiratory: CTAB  Discharge Instructions   Discharge Instructions    Diet Carb Modified   Complete by: As directed    Discharge wound care:   Complete by: As directed    See above   Increase activity slowly   Complete by: As directed      Allergies as of 06/13/2019      Reactions   Tramadol Other (See Comments)   Unknown; pt claims she doesn't recall having this allergy      Medication List    TAKE these medications   acetaminophen 325 MG tablet Commonly known as: TYLENOL Take 2 tablets (650 mg total) by mouth every 6 (six) hours as needed for mild pain (or Fever >/= 101).   ALPRAZolam 1 MG tablet Commonly known as: XANAX Take 1 tablet (1 mg total) by mouth at bedtime as needed for anxiety.   amphetamine-dextroamphetamine 30 MG tablet Commonly known as: ADDERALL Take 1 tablet by mouth daily.   aspirin EC 81 MG tablet Take 1 tablet (81 mg  total) by mouth 2 (two) times daily.   atorvastatin 40 MG tablet Commonly known as: LIPITOR Take 1 tablet (40 mg total) by mouth every evening. What changed: when to take this   DRY EYES OP Place 1-2 drops into both eyes daily as needed (dry eyes).   famotidine 20 MG tablet Commonly known as: PEPCID Take 1 tablet by mouth twice daily   FLUoxetine 40 MG capsule Commonly known as: PROZAC Take 2 capsules (80  mg total) by mouth every morning.   gabapentin 800 MG tablet Commonly known as: NEURONTIN TAKE 2 TABLETS BY MOUTH IN THE MORNING AND 3 TABLETS IN THE EVENING What changed:   how much to take  how to take this  when to take this  additional instructions   gentamicin cream 0.1 % Commonly known as: GARAMYCIN Apply 1 application topically 3 (three) times daily.   glucose blood test strip Commonly known as: ONE TOUCH ULTRA TEST 1 each by Other route 2 (two) times daily. And lancets 2/day.   HYDROcodone-acetaminophen 5-325 MG tablet Commonly known as: NORCO/VICODIN Take 1 tablet by mouth every 6 (six) hours as needed for severe pain.   insulin aspart protamine- aspart (70-30) 100 UNIT/ML injection Commonly known as: NOVOLOG MIX 70/30 Inject 0.07 mLs (7 Units total) into the skin 2 (two) times daily with a meal. What changed: how much to take   INSULIN SYRINGE .5CC/30GX1/2" 30G X 1/2" 0.5 ML Misc 1 each by Does not apply route every 12 (twelve) hours.   metFORMIN 1000 MG tablet Commonly known as: GLUCOPHAGE Take 1 tablet (1,000 mg total) by mouth 2 (two) times daily with a meal.   MULTIVITAMIN GUMMIES ADULT PO Take 1 capsule by mouth daily.   spironolactone 100 MG tablet Commonly known as: ALDACTONE Take 1 tablet by mouth once daily            Discharge Care Instructions  (From admission, onward)         Start     Ordered   06/13/19 0000  Discharge wound care:       Comments: See above   06/13/19 1018         Allergies  Allergen Reactions  . Tramadol Other (See Comments)    Unknown; pt claims she doesn't recall having this allergy     Follow-up Information    Toni Arthurs, MD. Schedule an appointment as soon as possible for a visit in 2 week(s).   Specialty: Orthopedic Surgery Contact information: [address] [place] [address] [phone]                The results of significant diagnostics from this  hospitalization (including imaging, microbiology, ancillary and laboratory) are listed below for reference.    Significant Diagnostic Studies: DG Toe Great Right  Result Date: 06/11/2019 CLINICAL DATA:  Tripped and fell 2 weeks ago EXAM: RIGHT GREAT TOE COMPARISON:  04/30/2019 FINDINGS: Frontal, oblique, lateral views of the right great toe are obtained. There is abnormal appearance of the distal margin of the first proximal phalanx with periosteal reaction and overlying soft tissue swelling, consistent with osteomyelitis. Superimposed upon this there is a comminuted fracture of the medial aspect distal margin first proximal phalanx. There are also erosive changes along the dorsal proximal aspect of the first distal phalanx consistent with osteomyelitis and likely septic arthritis of the first interphalangeal joint. There is diffuse soft tissue edema of the first digit. IMPRESSION: 1. Radiographic evidence of septic arthritis and  osteomyelitis surrounding the first interphalangeal joint. 2. Superimposed fracture through the distal medial aspect of the first proximal phalanx. 3. Extensive soft tissue edema. Electronically Signed   By: Randa Ngo M.D.   On: 06/11/2019 15:35    Microbiology: Recent Results (from the past 240 hour(s))  SARS Coronavirus 2 by RT PCR (hospital order, performed in [facility] hospital lab) Nasopharyngeal Nasopharyngeal Swab     Status: None   Collection Time: 06/11/19  7:50 PM   Specimen: Nasopharyngeal Swab  Result Value Ref Range Status   SARS Coronavirus 2 NEGATIVE NEGATIVE Final    Comment: (NOTE) SARS-CoV-2 target nucleic acids are NOT DETECTED. The SARS-CoV-2 RNA is generally detectable in upper and lower respiratory specimens during the acute phase of infection. The lowest concentration of SARS-CoV-2 viral copies this assay can detect is 250 copies / mL. A negative result does not preclude SARS-CoV-2 infection and should not be used as the sole basis for  treatment or other patient management decisions.  A negative result may occur with improper specimen collection / handling, submission of specimen other than nasopharyngeal swab, presence of viral mutation(s) within the areas targeted by this assay, and inadequate number of viral copies (<250 copies / mL). A negative result must be combined with clinical observations, patient history, and epidemiological information. Fact Sheet for Patients:   StrictlyIdeas.no Fact Sheet for Healthcare Providers: BankingDealers.co.za This test is not yet approved or cleared  by the [place] FDA and has been authorized for detection and/or diagnosis of SARS-CoV-2 by FDA under an Emergency Use Authorization (EUA).  This EUA will remain in effect (meaning this test can be used) for the duration of the COVID-19 declaration under Section 564(b)(1) of the Act, 21 U.S.C. section 360bbb-3(b)(1), unless the authorization is terminated or revoked sooner. Performed at [facility], [place] [address], [name], [place] [address]   Blood Cultures x 2 sites     Status: None (Preliminary result)   Collection Time: 06/11/19  9:38 PM   Specimen: BLOOD  Result Value Ref Range Status   Specimen Description BLOOD RIGHT ANTECUBITAL  Final   Special Requests   Final    BOTTLES DRAWN AEROBIC AND ANAEROBIC Blood Culture adequate volume PATIENT ON FOLLOWING VANCO ZOSYN   Culture   Final    NO GROWTH 3 DAYS Performed at [facility], [address], [place], [place] [address]    Report Status PENDING  Incomplete  Blood Cultures x 2 sites     Status: None (Preliminary result)   Collection Time: 06/11/19  9:38 PM   Specimen: BLOOD  Result Value Ref Range Status   Specimen Description BLOOD LEFT ANTECUBITAL  Final   Special Requests   Final    BOTTLES DRAWN AEROBIC AND ANAEROBIC Blood Culture adequate volume PATIENT ON FOLLOWING VANCO ZOSYN   Culture   Final     NO GROWTH 3 DAYS Performed at [facility], [address] [place], [place] [address]    Report Status PENDING  Incomplete  Surgical pcr screen     Status: Abnormal   Collection Time: 06/12/19 12:04 AM   Specimen: Nasal Mucosa; Nasal Swab  Result Value Ref Range Status   MRSA, PCR NEGATIVE NEGATIVE Final   Staphylococcus aureus POSITIVE (A) NEGATIVE Final    Comment: (NOTE) The Xpert SA Assay (FDA approved for NASAL specimens in patients 18 years of age and older), is one component of a comprehensive surveillance program. It is not intended  to diagnose infection nor to guide or monitor treatment. Performed at [facility], [address], [place], [place] [address]      Labs: Basic Metabolic Panel: Recent Labs  Lab 06/11/19 1942 06/12/19 0156 06/13/19 0439  NA 133* 135 138  K 4.9 4.7 3.8  CL 98 101 103  CO2 24 26 26   GLUCOSE 277* 203* 102*  BUN 10 8 6   CREATININE 0.80 0.76 0.77  CALCIUM 9.3 8.9 8.6*   Liver Function Tests: Recent Labs  Lab 06/11/19 1942  AST 11*  ALT 12  ALKPHOS 76  BILITOT 0.5  PROT 7.6  ALBUMIN 3.2*   No results for input(s): LIPASE, AMYLASE in the last 168 hours. No results for input(s): AMMONIA in the last 168 hours. CBC: Recent Labs  Lab 06/11/19 1942 06/12/19 0156 06/13/19 0439  WBC 12.3* 11.1* 10.4  NEUTROABS 8.0*  --   --   HGB 11.7* 10.6* 10.0*  HCT 36.1 32.6* 31.5*  MCV 87.8 87.4 88.7  PLT 426* 362 319   Cardiac Enzymes: No results for input(s): CKTOTAL, CKMB, CKMBINDEX, TROPONINI in the last 168 hours. BNP: BNP (last 3 results) No results for input(s): BNP in the last 8760 hours.  ProBNP (last 3 results) No results for input(s): PROBNP in the last 8760 hours.  CBG: Recent Labs  Lab 06/12/19 2349 06/13/19 0358 06/13/19 0428 06/13/19 0455 06/13/19 0722  GLUCAP 168* 81 89 120* 150*       Signed:  08/13/19 MD.  [facility] 06/14/2019, 2:59 PM

## 2019-06-16 LAB — CULTURE, BLOOD (ROUTINE X 2)
Culture: NO GROWTH
Culture: NO GROWTH

## 2019-06-17 ENCOUNTER — Other Ambulatory Visit: Payer: Self-pay

## 2019-06-17 ENCOUNTER — Ambulatory Visit (INDEPENDENT_AMBULATORY_CARE_PROVIDER_SITE_OTHER): Payer: No Payment, Other | Admitting: Licensed Clinical Social Worker

## 2019-06-17 DIAGNOSIS — F418 Other specified anxiety disorders: Secondary | ICD-10-CM | POA: Diagnosis not present

## 2019-06-17 NOTE — Progress Notes (Signed)
   THERAPIST PROGRESS NOTE  Session Time: 55 min  Participation Level: Active  Behavioral Response: Casual and Fairly GroomedAlertAnxious and Depressed  Type of Therapy: Individual Therapy  Treatment Goals addressed: Communication: Further assessment of communication with spouse and "trust issues". and Coping  Interventions: Supportive and Other: Additional assessment of relationship with spouse.  Summary: [name] is a 49 y.o. female who presents with ongoing concerns about coping with anx/dep and current relationship with spouse. She has also just been hospitalized r/t R toe injury and had R toe amputated. She believes her spouse is having more difficulty coping with this event than she is. Reports she is trying to remain optomistic while stating she is not normally and optomistic person. Her physical pain with amputation is well managed as she has limited feeling d/t diabetic neuropathy. She admits to balance issues with one fall since home, no injury. LCSW provided supportive care around this medical challenge. LCSW assessed for additional information re relationship with spouse, both past and present. Pt reveals more details on other breaches of pt's trust r/t contacts with other women. Relationship at present appears strained. Limited contact and communication d/t spouse's work and sleep schedules. Goes in to work at 3:30pm and gets off 2:30am. Pt self reports other relationship dynamics and states, "We have a mother/child relationship". LCSW validated "trust issues" given additional info with plans to further address coping. Pt denies any cutting over past wk. She states she did have the urge but did not act. Praised this behavior.  Reveiwed poc. Pt states appreciation for care.  Suicidal/Homicidal: Nowithout intent/plan  Therapist Response: Pt continues to be open and responsive in session.  Plan: Return again in ~ 1 week. Pt made aware of center getting busier and possibility of  being scheduled out more than a wk. Pt verbalizes understanding.  Diagnosis: Axis I: Anxiety with Depression    Axis II: Deferred  [place] Sink, LCSW 06/17/2019

## 2019-06-24 ENCOUNTER — Other Ambulatory Visit: Payer: Self-pay

## 2019-06-24 ENCOUNTER — Ambulatory Visit (INDEPENDENT_AMBULATORY_CARE_PROVIDER_SITE_OTHER): Payer: No Payment, Other | Admitting: Licensed Clinical Social Worker

## 2019-06-24 DIAGNOSIS — F418 Other specified anxiety disorders: Secondary | ICD-10-CM

## 2019-06-24 NOTE — Progress Notes (Signed)
   THERAPIST PROGRESS NOTE  Session Time: 50 min  Participation Level: Active  Behavioral Response: CasualAlertDysphoric and Worthless  Type of Therapy: Individual Therapy  Treatment Goals addressed: Coping  Interventions: Supportive and Reframing  Summary: [name] is a 49 y.o. female who presents with hx of anx/dep. Pt reports she has had bronchitis over the past week and feeling very fatigued. States she stayed in the bed 2 full days which is not like her at all. Has completed a Z-pak and pednisone she obtained from teledoc appt. Pt still has a notable cough which is exacerbated by talking during session. Readdressed trust issues with spouse, risks/benefits. Pt reports she as decided she is going to trust again so they can try to move forward as she does want relationship to work. Pt will be getting her stitches out of her foot from amputated toe tomorrow. She states she has been trying to prepare herself for sight of foot by looking at [employer] on internet. Describes as "gross". Pt informs her older brother who also has uncontrolled DM had his toe amputated in past, has no current contact with bro. Reports her feet have always been the most favorite part of her body. Assessed for coping. Pt appears to be trying to take a realistic/practical approach. States "It's already done, can't change it". Pt speaks of her decsion making re DM control and openly acknowledges she is "fully responsible and I know what I am doing" re the poor state of health she is in. Despite exploration and challenge she stands strong in her rejection of insulin at this time. States "maybe it is a control thing". Agrees she could decide to change her decision making pattern but declines. Pt makes intermittent disparaging comments re self: "I'm and idiot", "horrible", "always a negative". Make plan to further address poor self esteem next session. Pt agrees. She reports she got a new 26 wk old siamese kitten she surprised  spouse with over the weekend. They are both very excited to add this addition to the cats they already have. Pt gets much pleasure from her pets.   Suicidal/Homicidal: Nowithout intent/plan  Therapist Response: Educate and address self talk next session.  Plan: Return again in 1 week.  Diagnosis: Axis I: Anxiety with Depression    Axis II: Deferred  [place] Sink, LCSW 06/24/2019

## 2019-06-26 ENCOUNTER — Emergency Department (HOSPITAL_COMMUNITY)
Admission: EM | Admit: 2019-06-26 | Discharge: 2019-06-26 | Disposition: A | Discharge status: Home / Self Care | Payer: Medicaid Other | Attending: Emergency Medicine | Admitting: Emergency Medicine

## 2019-06-26 ENCOUNTER — Other Ambulatory Visit: Payer: Self-pay

## 2019-06-26 ENCOUNTER — Encounter (HOSPITAL_COMMUNITY): Payer: Self-pay

## 2019-06-26 ENCOUNTER — Emergency Department (HOSPITAL_COMMUNITY): Payer: Medicaid Other

## 2019-06-26 DIAGNOSIS — Z7982 Long term (current) use of aspirin: Secondary | ICD-10-CM | POA: Insufficient documentation

## 2019-06-26 DIAGNOSIS — R0981 Nasal congestion: Secondary | ICD-10-CM | POA: Diagnosis not present

## 2019-06-26 DIAGNOSIS — Z7984 Long term (current) use of oral hypoglycemic drugs: Secondary | ICD-10-CM | POA: Diagnosis not present

## 2019-06-26 DIAGNOSIS — E1040 Type 1 diabetes mellitus with diabetic neuropathy, unspecified: Secondary | ICD-10-CM | POA: Diagnosis not present

## 2019-06-26 DIAGNOSIS — R531 Weakness: Secondary | ICD-10-CM

## 2019-06-26 DIAGNOSIS — Z87891 Personal history of nicotine dependence: Secondary | ICD-10-CM | POA: Insufficient documentation

## 2019-06-26 DIAGNOSIS — I10 Essential (primary) hypertension: Secondary | ICD-10-CM | POA: Insufficient documentation

## 2019-06-26 DIAGNOSIS — F1210 Cannabis abuse, uncomplicated: Secondary | ICD-10-CM | POA: Insufficient documentation

## 2019-06-26 DIAGNOSIS — R05 Cough: Secondary | ICD-10-CM | POA: Insufficient documentation

## 2019-06-26 DIAGNOSIS — Z79899 Other long term (current) drug therapy: Secondary | ICD-10-CM | POA: Insufficient documentation

## 2019-06-26 DIAGNOSIS — R0602 Shortness of breath: Secondary | ICD-10-CM | POA: Insufficient documentation

## 2019-06-26 LAB — CBC WITH DIFFERENTIAL/PLATELET
Abs Immature Granulocytes: 0.06 10*3/uL (ref 0.00–0.07)
Basophils Absolute: 0.1 10*3/uL (ref 0.0–0.1)
Basophils Relative: 1 %
Eosinophils Absolute: 0.4 10*3/uL (ref 0.0–0.5)
Eosinophils Relative: 3 %
HCT: 38.1 % (ref 36.0–46.0)
Hemoglobin: 12.4 g/dL (ref 12.0–15.0)
Immature Granulocytes: 1 %
Lymphocytes Relative: 25 %
Lymphs Abs: 2.9 10*3/uL (ref 0.7–4.0)
MCH: 27.7 pg (ref 26.0–34.0)
MCHC: 32.5 g/dL (ref 30.0–36.0)
MCV: 85.2 fL (ref 80.0–100.0)
Monocytes Absolute: 0.8 10*3/uL (ref 0.1–1.0)
Monocytes Relative: 7 %
Neutro Abs: 7.5 10*3/uL (ref 1.7–7.7)
Neutrophils Relative %: 63 %
Platelets: 424 10*3/uL — ABNORMAL HIGH (ref 150–400)
RBC: 4.47 MIL/uL (ref 3.87–5.11)
RDW: 12.8 % (ref 11.5–15.5)
WBC: 11.7 10*3/uL — ABNORMAL HIGH (ref 4.0–10.5)
nRBC: 0 % (ref 0.0–0.2)

## 2019-06-26 LAB — COMPREHENSIVE METABOLIC PANEL
ALT: 16 U/L (ref 0–44)
AST: 11 U/L — ABNORMAL LOW (ref 15–41)
Albumin: 2.8 g/dL — ABNORMAL LOW (ref 3.5–5.0)
Alkaline Phosphatase: 57 U/L (ref 38–126)
Anion gap: 12 (ref 5–15)
BUN: 18 mg/dL (ref 6–20)
CO2: 21 mmol/L — ABNORMAL LOW (ref 22–32)
Calcium: 8.5 mg/dL — ABNORMAL LOW (ref 8.9–10.3)
Chloride: 101 mmol/L (ref 98–111)
Creatinine, Ser: 0.88 mg/dL (ref 0.44–1.00)
GFR calc Af Amer: >60 mL/min (ref >60)
GFR calc non Af Amer: >60 mL/min (ref >60)
Glucose, Bld: 244 mg/dL — ABNORMAL HIGH (ref 70–99)
Potassium: 4.2 mmol/L (ref 3.5–5.1)
Sodium: 134 mmol/L — ABNORMAL LOW (ref 135–145)
Total Bilirubin: 1.1 mg/dL (ref 0.3–1.2)
Total Protein: 6.4 g/dL — ABNORMAL LOW (ref 6.5–8.1)

## 2019-06-26 LAB — I-STAT VENOUS BLOOD GAS, ED
Acid-Base Excess: 0 mmol/L (ref 0.0–2.0)
Bicarbonate: 25.1 mmol/L (ref 20.0–28.0)
Calcium, Ion: 1.1 mmol/L — ABNORMAL LOW (ref 1.15–1.40)
HCT: 40 % (ref 36.0–46.0)
Hemoglobin: 13.6 g/dL (ref 12.0–15.0)
O2 Saturation: 72 %
Potassium: 4.5 mmol/L (ref 3.5–5.1)
Sodium: 138 mmol/L (ref 135–145)
TCO2: 26 mmol/L (ref 22–32)
pCO2, Ven: 40 mmHg — ABNORMAL LOW (ref 44.0–60.0)
pH, Ven: 7.406 (ref 7.250–7.430)
pO2, Ven: 38 mmHg (ref 32.0–45.0)

## 2019-06-26 MED ORDER — METOCLOPRAMIDE HCL 5 MG/ML IJ SOLN
10.0000 mg | Freq: Once | INTRAMUSCULAR | Status: AC
  Administered 2019-06-26: 10 mg via INTRAVENOUS
  Filled 2019-06-26: qty 2

## 2019-06-26 MED ORDER — SODIUM CHLORIDE 0.9 % IV BOLUS
1000.0000 mL | Freq: Once | INTRAVENOUS | Status: AC
  Administered 2019-06-26: 1000 mL via INTRAVENOUS

## 2019-06-26 MED ORDER — SODIUM CHLORIDE 0.9 % IV SOLN: INTRAVENOUS | Status: DC

## 2019-06-26 MED ORDER — IOHEXOL 350 MG/ML SOLN
100.0000 mL | Freq: Once | INTRAVENOUS | Status: AC | PRN
  Administered 2019-06-26: 100 mL via INTRAVENOUS
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
## 2019-06-26 NOTE — ED Triage Notes (Signed)
Pt BIB [facility] from home for generalized weakness, FTT, hypotension, dizziness, N/V since removal of R great toe 2 weeks ago. Pt dx bronchitis via tele doc, given albuterol inhaler and Z pack. Hx gastroparesis, lost 13 lb in 2 weeks. Pt blind, shadows in 10% of Left eye. Received 350 NS per EMS. Pt A&O, NAD noted. Sinus tach.

## 2019-06-27 DIAGNOSIS — Z89419 Acquired absence of unspecified great toe: Secondary | ICD-10-CM | POA: Insufficient documentation

## 2019-07-03 ENCOUNTER — Other Ambulatory Visit: Payer: Self-pay | Admitting: Family Medicine

## 2019-07-03 DIAGNOSIS — I10 Essential (primary) hypertension: Secondary | ICD-10-CM

## 2019-07-03 DIAGNOSIS — E1149 Type 2 diabetes mellitus with other diabetic neurological complication: Secondary | ICD-10-CM

## 2019-07-03 DIAGNOSIS — E119 Type 2 diabetes mellitus without complications: Secondary | ICD-10-CM

## 2019-07-09 ENCOUNTER — Telehealth (INDEPENDENT_AMBULATORY_CARE_PROVIDER_SITE_OTHER): Payer: No Payment, Other | Admitting: Psychiatry

## 2019-07-09 ENCOUNTER — Other Ambulatory Visit: Payer: Self-pay

## 2019-07-09 ENCOUNTER — Encounter (HOSPITAL_COMMUNITY): Payer: Self-pay | Admitting: Psychiatry

## 2019-07-09 DIAGNOSIS — F314 Bipolar disorder, current episode depressed, severe, without psychotic features: Secondary | ICD-10-CM

## 2019-07-09 DIAGNOSIS — F418 Other specified anxiety disorders: Secondary | ICD-10-CM | POA: Diagnosis not present

## 2019-07-09 MED ORDER — AMPHETAMINE-DEXTROAMPHETAMINE 30 MG PO TABS: 30.0000 mg | ORAL_TABLET | Freq: Every day | ORAL | 0 refills | Status: DC

## 2019-07-09 MED ORDER — FLUOXETINE HCL 40 MG PO CAPS: 80.0000 mg | ORAL_CAPSULE | Freq: Every morning | ORAL | 2 refills | Status: DC

## 2019-07-09 MED ORDER — BUSPIRONE HCL 10 MG PO TABS: 10.0000 mg | ORAL_TABLET | Freq: Three times a day (TID) | ORAL | 2 refills | Status: DC

## 2019-07-09 MED ORDER — ALPRAZOLAM 1 MG PO TABS: 1.0000 mg | ORAL_TABLET | Freq: Every evening | ORAL | 0 refills | Status: DC | PRN
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
## 2019-07-10 ENCOUNTER — Ambulatory Visit (INDEPENDENT_AMBULATORY_CARE_PROVIDER_SITE_OTHER): Payer: No Payment, Other | Admitting: Licensed Clinical Social Worker

## 2019-07-10 DIAGNOSIS — F418 Other specified anxiety disorders: Secondary | ICD-10-CM | POA: Diagnosis not present

## 2019-07-11 NOTE — Progress Notes (Signed)
   THERAPIST PROGRESS NOTE  Session Time: 50 min  Participation Level: Active  Behavioral Response: Fairly GroomedAlertDepressed  Type of Therapy: Individual Therapy  Treatment Goals addressed: Anxiety and Coping  Interventions: Solution Focused and Supportive  Summary: [name] is a 49 y.o. female who presents with hx anx/dep. Today pt presents as more depressed and endorses same. Pt states Her "eyes have tanked again" saying her vision has again declined. She details limitations and impacts. She cannot do her own meds as she cannot read bottles, can no longer read (which helped with a positive distraction), unable to do financial management, can't change litter box, etc. Pt sleeping more and distressed she cannot depend on spouse the way she hoped. She feels he is very depressed and will not follow through on much "he avoids everything". Pt expresses much frustration. LCSW provided active listening and assisted pt with some problem solving. She will consider pre packaged meds, will think about contacting [facility] to reassess her situation/provide training in the home as indicated to navigate daily self care/needs. Assessed for status of amputation. She states when she went to have stitches out they felt all going very well but wanted to leave stitches in 2 more weeks, stitches should be removed tomorrow. Pt reports being able to wear regular shoes. Despite bad news pt shares several positives as well. New kitten, [name], fitting in well. A trip to [place] is planned Aug 11-16 to see her father and a friend she claims as a sister, [name]. Pt smiles and expresses excitement. 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 She did this so she can officiate a friends wedding if needed. She states she and spouse are also sponsoring a 29 yr old boy in [place] who needs equipment to be able to attend school. They have ordered  needed equipment, all of which has come in and she intends to get this shipped to him with [name]'s help. Pt feels positive about giving to someone in need. LCSW congratulated pt on positives and for sharing them. LCSW reviewed poc with pt's verbal agreement. Pt states appreciation for care.   Suicidal/Homicidal: Nowithout intent/plan  Therapist Response: Pt remains mostly open and receptive to care.   Plan: Return again in 2 weeks.  Diagnosis: Axis I: Anxiety with depression    Axis II: Deferred  Joanna Sink, LCSW 07/11/2019

## 2019-07-17 ENCOUNTER — Ambulatory Visit (INDEPENDENT_AMBULATORY_CARE_PROVIDER_SITE_OTHER): Payer: No Payment, Other | Admitting: Licensed Clinical Social Worker

## 2019-07-17 ENCOUNTER — Other Ambulatory Visit: Payer: Self-pay

## 2019-07-17 ENCOUNTER — Ambulatory Visit: Payer: Self-pay | Admitting: Nurse Practitioner

## 2019-07-17 DIAGNOSIS — F418 Other specified anxiety disorders: Secondary | ICD-10-CM

## 2019-07-18 NOTE — Progress Notes (Signed)
   THERAPIST PROGRESS NOTE  Session Time: 55 min  Participation Level: Active  Behavioral Response: CasualAlertDysphoric  Type of Therapy: Individual Therapy  Treatment Goals addressed: Anxiety, Communication: Relationship matters and Coping  Interventions: Solution Focused, Supportive and Other: Assessment  Summary: [name] is a 49 y.o. female who presents with hx of anx/dep. Today pt presents as anxious and depressed. She is anxious over loss of sight, which she says has declined further since last wk. She details impacts. Reports she can still do a few things with max powered readers and magnifier. She did a non-detailed painting last night she shares and has past paintings LCSW can see in background which are quite good. Pt reports her mother was creative and artistic and believes she got talents from her. Talks about the dread of when she can do less and feeling totally dependent. Assessed for contact with [facility]. Pt has decided not to get back in touch with them at this time and explains. Pt makes the comment "When I go blind I'm going to off myself". Exploration of this reveals pt thinks about taking her own life when she goes totally blind but denies plan/intent at this time. She continues to be highly frustrated with spouse. States she is now eligible to serve him with divorce papers. States they have talked about this "a lot". She continues to say she does love him and is conflicted. LCSW explores what she loves about him. She reports spouse is supposed to move to a first shift position at work and wants to see what difference this will make in the relationship when they can operate on more normal hours. Addressed coping. Reviewed poc with pt's verbal agreement. Pt states appreciation for care.    Suicidal/Homicidal: Yeswithout intent/plan  Therapist Response: Pt remains receptive to counseling support.  Plan: Return again in 1  week.  Diagnosis: Axis I: Anxiety with Depression    Axis II: Deferred  [place] Sink, LCSW 07/18/2019

## 2019-07-24 ENCOUNTER — Other Ambulatory Visit: Payer: Self-pay

## 2019-07-24 ENCOUNTER — Ambulatory Visit (INDEPENDENT_AMBULATORY_CARE_PROVIDER_SITE_OTHER): Payer: No Payment, Other | Admitting: Licensed Clinical Social Worker

## 2019-07-24 DIAGNOSIS — F418 Other specified anxiety disorders: Secondary | ICD-10-CM | POA: Diagnosis not present

## 2019-07-25 NOTE — Progress Notes (Signed)
   THERAPIST PROGRESS NOTE  Session Time: 50 min  Participation Level: Active  Behavioral Response: CasualAlertDysphoric  Type of Therapy: Individual Therapy  Treatment Goals addressed: Coping  Interventions: Motivational Interviewing and Supportive  Summary: [name] is a 49 y.o. female who presents with hx of anx/dep. Pt's sessions continue to be online. Pt states she is doing "good", "Not much has changed." Pt has finally gotten her Buspar just yesterday. Again explored options on medications given her sight limitations. She states she continues to have a difficult time getting spouse to do anything. LCSW further explored dynamic of marital relationship. Pt states they have never had couples counseling. Pt does have access to [facility] as a last resort if she needs to get somewhere and cannot get [name] to cooperate. Pt states she still struggles with the loss of her ability to drive which came about in 2017. Pt reports some anx over needing to get [name]'s help to fill out renewal on disability paperwork she has had since March. Discussed consequences of not getting that submitted on time and strategy to get it completed. Spent some time addressing loss of independence and coping with same. Pt still planning to go on her trip to [place] and looking forward to it. She does bring up not wanting to see her brother which was explored. Addressed how she will respond if she does see her brother. Reviewed poc with pt's verbal agreement prior to close of session. Pt states appreciation for care.    Suicidal/Homicidal: Nowithout intent/plan  Therapist Response: Pt remains receptive to care.  Plan: Return again in 1 week.  Diagnosis: Axis I: Anxiety with depression    Axis II: Deferred  [place] Sink, LCSW 07/25/2019

## 2019-07-31 ENCOUNTER — Other Ambulatory Visit: Payer: Self-pay

## 2019-07-31 ENCOUNTER — Ambulatory Visit (INDEPENDENT_AMBULATORY_CARE_PROVIDER_SITE_OTHER): Payer: No Payment, Other | Admitting: Licensed Clinical Social Worker

## 2019-07-31 DIAGNOSIS — F418 Other specified anxiety disorders: Secondary | ICD-10-CM | POA: Diagnosis not present

## 2019-08-01 NOTE — Progress Notes (Signed)
   THERAPIST PROGRESS NOTE  Session Time: 55 min  Participation Level: Active  Behavioral Response: CasualAlertDepressed and Dysphoric  Type of Therapy: Individual Therapy  Treatment Goals addressed: Coping  Interventions: Motivational Interviewing and Supportive  Summary: [name] is a 49 y.o. female who presents with hx of dep/anx. This date pt presents as depressed and frustrated. When asked how she is pt states "I'm a mess but good". She is distressed with marital relationship. Almost entire session devoted to pt's marital relationship and what she intends to do about same as she feels the relationship is now over. She explains details of what has caused her to reach this conclusion. She states "I will have to think about it" when asked if she feels like couples counseling should be tried since they never have tried this. She states she is not sure she can invest the energy. She does plan to still go on trip to [place] with spouse, leaving 8/11. LCSW assessed for suicidality. Pt admits she has thoughts but denies intent/plan at this time. She verbally contracts for safety. LCSW assessed for status of disability renewal paperwork. Spouse reportedly attempted to help her with it but Elwyn states "He couldn't understand it". She phoned her contact at [facility] who is going to help her. LCSW reviewed poc prior to close of session with pt's verbal agreement. Pt states appreciation for care.       Suicidal/Homicidal: Yeswithout intent/plan  Therapist Response: Remains receptive to care. See notes  Plan: Return again in ~3 weeks.  Diagnosis: Axis I: Depression with Anxiety    Axis II: Deferred  [place] Sink, LCSW 08/01/2019

## 2019-08-13 ENCOUNTER — Ambulatory Visit (HOSPITAL_COMMUNITY): Payer: No Payment, Other | Admitting: Licensed Clinical Social Worker

## 2019-08-18 ENCOUNTER — Ambulatory Visit: Payer: Self-pay | Admitting: Nurse Practitioner

## 2019-08-21 ENCOUNTER — Other Ambulatory Visit: Payer: Self-pay

## 2019-08-21 ENCOUNTER — Ambulatory Visit (INDEPENDENT_AMBULATORY_CARE_PROVIDER_SITE_OTHER): Payer: No Payment, Other | Admitting: Licensed Clinical Social Worker

## 2019-08-21 DIAGNOSIS — F418 Other specified anxiety disorders: Secondary | ICD-10-CM

## 2019-08-22 ENCOUNTER — Other Ambulatory Visit: Payer: Self-pay | Admitting: Family Medicine

## 2019-08-22 DIAGNOSIS — E1149 Type 2 diabetes mellitus with other diabetic neurological complication: Secondary | ICD-10-CM

## 2019-08-22 NOTE — Progress Notes (Signed)
   THERAPIST PROGRESS NOTE  Session Time: 55 min Virtual Visit via Video Note  I connected with [name] on 08/22/19 at 11:00 AM EDT by a video enabled telemedicine application and verified that I am speaking with the correct person using two identifiers.  Location: Patient: Home Provider: [facility]   I discussed the limitations of evaluation and management by telemedicine and the availability of in person appointments. The patient expressed understanding and agreed to proceed.  I discussed the assessment and treatment plan with the patient. The patient was provided an opportunity to ask questions and all were answered. The patient agreed with the plan and demonstrated an understanding of the instructions.   I provided 55 minutes of non-face-to-face time during this encounter. [place] Sink, LCSW    Participation Level: Active  Behavioral Response: CasualAlertDysphoric  Type of Therapy: Individual Therapy  Treatment Goals addressed: Communication: Realationship matters and Coping  Interventions: Motivational Interviewing and Supportive  Summary: [name] is a 49 y.o. female who presents with hx of anx/dep. Pt signs on for video session. LCSW assessed for status of trip home to see her father and others in [place]. Pt provides many details and most of session spent processing how trip went with [facility] and the variety of people she ended up seeing or failed to see. Much time spent on relationship matters this session. Her sister backed out of seeing her. She was hopeful she would not see her bro and didn't. She was able to connect by surprise with a "best friend" named Renae Fickle. Saw her aunt, Vernona Rieger. Had her birthday while there and her aunt came to dinner with father, pt and logan. Pt continues to see Trey Paula at home and today reports she has feelings for the 47 yr old boy in [place]. She states this is "more of an emotional thing". She is conflicted about what to do r/t marriage and  reports difficulty with multiple decisions she needs to make but feels stuck. Pt agrees to write out all of the decisions she feels she needs to work on before next session. Pt reports she will have medicare as of Dec 1 this yr. She states she has already made an eye Dr. appt and is very pleased she will have ins allowing her to take better care of herself. LCSW commended this goal. Pt states she is trying to figure out which medicare plan to go with. Referral and education provided on SHIP. LCSW reviewed coping and poc prior to close of session. Pt states appreciation for care.       Suicidal/Homicidal: Nowithout intent/plan  Therapist Response: Pt remains receptive to care. Per schedulers pt displeased with moving sessions to every other wk.  Plan: Return again in 2 weeks.  Diagnosis: Axis I: Anxiety with depression    Axis II: Deferred  [place] Sink, LCSW 08/22/2019

## 2019-08-25 ENCOUNTER — Other Ambulatory Visit: Payer: Self-pay

## 2019-08-25 ENCOUNTER — Encounter: Payer: Self-pay | Admitting: Nurse Practitioner

## 2019-08-25 ENCOUNTER — Ambulatory Visit (INDEPENDENT_AMBULATORY_CARE_PROVIDER_SITE_OTHER): Payer: Self-pay | Admitting: Nurse Practitioner

## 2019-08-25 ENCOUNTER — Telehealth (HOSPITAL_COMMUNITY): Payer: Self-pay | Admitting: *Deleted

## 2019-08-25 VITALS — BP 116/68 | HR 80 | Temp 98.1°F | Ht 64.0 in | Wt 158.0 lb

## 2019-08-25 DIAGNOSIS — E282 Polycystic ovarian syndrome: Secondary | ICD-10-CM

## 2019-08-25 DIAGNOSIS — E08319 Diabetes mellitus due to underlying condition with unspecified diabetic retinopathy without macular edema: Secondary | ICD-10-CM

## 2019-08-25 DIAGNOSIS — K219 Gastro-esophageal reflux disease without esophagitis: Secondary | ICD-10-CM

## 2019-08-25 DIAGNOSIS — Z8639 Personal history of other endocrine, nutritional and metabolic disease: Secondary | ICD-10-CM

## 2019-08-25 DIAGNOSIS — Z1231 Encounter for screening mammogram for malignant neoplasm of breast: Secondary | ICD-10-CM

## 2019-08-25 DIAGNOSIS — I10 Essential (primary) hypertension: Secondary | ICD-10-CM

## 2019-08-25 DIAGNOSIS — IMO0002 Reserved for concepts with insufficient information to code with codable children: Secondary | ICD-10-CM

## 2019-08-25 DIAGNOSIS — E0865 Diabetes mellitus due to underlying condition with hyperglycemia: Secondary | ICD-10-CM

## 2019-08-25 MED ORDER — PANTOPRAZOLE SODIUM 40 MG PO TBEC: 40.0000 mg | DELAYED_RELEASE_TABLET | Freq: Every day | ORAL | 3 refills | Status: AC

## 2019-08-25 MED ORDER — ALBUTEROL SULFATE HFA 108 (90 BASE) MCG/ACT IN AERS: 2.0000 | INHALATION_SPRAY | Freq: Four times a day (QID) | RESPIRATORY_TRACT | 11 refills | Status: AC | PRN

## 2019-08-25 NOTE — Telephone Encounter (Signed)
VM from patient requesting her Adderall be called into to [facility] in [place] [employer]. Checked the chart and she should have been out on 8/7 and her next appt with provider B. Doyne Keel NP is on 9/7. Will relate this concern to provider for a RX to be called in.

## 2019-08-25 NOTE — Progress Notes (Signed)
[facility] [address] [place], [place]  [address] Phone:  [phone]   Fax:  [phone]    Established Patient Office Visit  Subjective:  Patient ID: [name], female    DOB: [birth year]  Age: 49 y.o. MRN: [id number]  CC:  Chief Complaint  Patient presents with  . Follow-up    HPI [name] presents for follow up. She  has a past medical history of Anxiety, Depression, Diabetes mellitus without complication (HCC), High cholesterol, Hypertension, Neuropathy, PCOS (polycystic ovarian syndrome), and Type 1 diabetes (HCC).   Diabetes Mellitus Patient presents for follow up of diabetes. Current symptoms include: hyperglycemia, nausea, paresthesia of the feet, visual disturbances and vomitting. Symptoms have stabilized. Patient denies hypoglycemia . Evaluation to date has included: fasting blood sugar, fasting lipid panel, hemoglobin A1C and microalbuminuria.  Home sugars: patient does not check sugars. Current treatment: Discontinued insulin which has been discontinued because of side effects, Continued metformin which has been somewhat effective and Continued statin which has been effective. Last dilated eye exam: Dec 2021 apt pending.  GERD Paitent complains of heartburn. This has been associated with abdominal bloating, belching and eructation, heartburn, nausea, upper abdominal discomfort and vomiting.  She denies chest pain, choking on food, deep pressure at base of neck, hematemesis, hoarseness, laryngitis and melena. Symptoms have been present for several months. She has had dysphagia for solids. She wt loss in unknown because patient has declined weight in the past. She has a desire to not gain weight this is why she is not compliant with her Diabetes regimen. She refuses the insulin therapy. She understands noncompliance. She admits that the gastroparesis is her problem. . She denies melena, hematochezia, hematemesis, and coffee ground emesis.  Medical therapy in the past has included antacids.    Past Medical History:  Diagnosis Date  . Anxiety   . Depression   . Diabetes mellitus without complication (HCC)   . High cholesterol   . Hypertension   . Neuropathy   . PCOS (polycystic ovarian syndrome)   . Type 1 diabetes [facility])     Past Surgical History:  Procedure Laterality Date  . AMPUTATION Right 06/12/2019   Procedure: AMPUTATION RAY RIGHT GREAT TOE;  Surgeon: Toni Arthurs, MD;  Location: [facility] OR;  Service: Orthopedics;  Laterality: Right;  . ELBOW SURGERY    . INSERTION OF AHMED VALVE Right 09/07/2015   Procedure: INSERTION OF AHMED VALVE;  Surgeon: Edmon Crape, MD;  Location: [facility] OR;  Service: Ophthalmology;  Laterality: Right;  . LASER PHOTO ABLATION Right 09/07/2015   Procedure: LASER PHOTO ABLATION;  Surgeon: Edmon Crape, MD;  Location: [facility] OR;  Service: Ophthalmology;  Laterality: Right;  . PARS PLANA VITRECTOMY Right 09/07/2015   Procedure: PARS PLANA VITRECTOMY WITH 25 GAUGE,, with panretinal endolaser,;  Surgeon: Edmon Crape, MD;  Location: [facility] OR;  Service: Ophthalmology;  Laterality: Right;    Family History  Problem Relation Age of Onset  . Depression Mother   . Anxiety disorder Mother   . Diabetes Mother   . Diabetes Father   . Diabetes Brother     Social History   Socioeconomic History  . Marital status: Married    Spouse name: Not on file  . Number of children: Not on file  . Years of education: Not on file  . Highest education level: Not on file  Occupational History  . Not on file  Tobacco Use  . Smoking status:  Former Smoker    Types: Cigarettes    Quit date: 09/12/2000    Years since quitting: 18.9  . Smokeless tobacco: Never Used  Vaping Use  . Vaping Use: Never used  Substance and Sexual Activity  . Alcohol use: Not Currently    Comment: occasional  . Drug use: Yes    Types: Marijuana    Comment: daily   . Sexual activity: Yes    Birth control/protection: None, Condom  Other  Topics Concern  . Not on file  Social History Narrative  . Not on file   Social Determinants of Health   Financial Resource Strain:   . Difficulty of Paying Living Expenses: Not on file  Food Insecurity:   . Worried About Programme researcher, broadcasting/film/videounning Out of Food in the Last Year: Not on file  . Ran Out of Food in the Last Year: Not on file  Transportation Needs:   . Lack of Transportation (Medical): Not on file  . Lack of Transportation (Non-Medical): Not on file  Physical Activity:   . Days of Exercise per Week: Not on file  . Minutes of Exercise per Session: Not on file  Stress:   . Feeling of Stress : Not on file  Social Connections:   . Frequency of Communication with Friends and Family: Not on file  . Frequency of Social Gatherings with Friends and Family: Not on file  . Attends Religious Services: Not on file  . Active Member of Clubs or Organizations: Not on file  . Attends BankerClub or Organization Meetings: Not on file  . Marital Status: Not on file  Intimate Partner Violence:   . Fear of Current or Ex-Partner: Not on file  . Emotionally Abused: Not on file  . Physically Abused: Not on file  . Sexually Abused: Not on file    Outpatient Medications Prior to Visit  Medication Sig Dispense Refill  . acetaminophen (TYLENOL) 325 MG tablet Take 2 tablets (650 mg total) by mouth every 6 (six) hours as needed for mild pain (or Fever >/= 101).    . Artificial Tear Ointment (DRY EYES OP) Place 1-2 drops into both eyes daily as needed (dry eyes).     Marland Kitchen. aspirin EC 81 MG tablet Take 1 tablet (81 mg total) by mouth 2 (two) times daily. 60 tablet 2  . atorvastatin (LIPITOR) 40 MG tablet Take 1 tablet (40 mg total) by mouth every evening. (Patient taking differently: Take 40 mg by mouth every morning. ) 90 tablet 2  . famotidine (PEPCID) 20 MG tablet Take 1 tablet by mouth twice daily 30 tablet 3  . FLUoxetine (PROZAC) 40 MG capsule Take 2 capsules (80 mg total) by mouth every morning. 60 capsule 2  .  gabapentin (NEURONTIN) 800 MG tablet TAKE 2 TABLETS BY MOUTH IN THE MORNING AND 3 IN THE EVENING 150 tablet 0  . glucose blood (ONE TOUCH ULTRA TEST) test strip 1 each by Other route 2 (two) times daily. And lancets 2/day. 100 each 0  . insulin aspart protamine- aspart (NOVOLOG MIX 70/30) (70-30) 100 UNIT/ML injection Inject 0.07 mLs (7 Units total) into the skin 2 (two) times daily with a meal. 30 mL 5  . Insulin Syringe-Needle U-100 (INSULIN SYRINGE .5CC/30GX1/2") 30G X 1/2" 0.5 ML MISC 1 each by Does not apply route every 12 (twelve) hours. 100 each 1  . metFORMIN (GLUCOPHAGE) 1000 MG tablet TAKE 1 TABLET BY MOUTH TWICE DAILY WITH  A  MEAL. 180 tablet 0  . Multiple  Vitamins-Minerals (MULTIVITAMIN GUMMIES ADULT PO) Take 1 capsule by mouth daily.    . naproxen sodium (ALEVE) 220 MG tablet Take 220 mg by mouth daily as needed (headache).    Marland Kitchen spironolactone (ALDACTONE) 100 MG tablet Take 1 tablet by mouth once daily 90 tablet 0  . albuterol (VENTOLIN HFA) 108 (90 Base) MCG/ACT inhaler Inhale 2 puffs into the lungs every 6 (six) hours as needed for wheezing or shortness of breath.     . amphetamine-dextroamphetamine (ADDERALL) 30 MG tablet Take 1 tablet by mouth daily. 30 tablet 0  . clonazePAM (KLONOPIN) 1 MG tablet clonazepam 1 mg tablet  TAKE 1 TABLET BY MOUTH TWICE DAILY AS NEEDED FOR ANXIETY    . busPIRone (BUSPAR) 10 MG tablet Take 1 tablet (10 mg total) by mouth 3 (three) times daily. (Patient not taking: Reported on 08/25/2019) 90 tablet 2  . gentamicin cream (GARAMYCIN) 0.1 % Apply 1 application topically 3 (three) times daily. (Patient not taking: Reported on 08/25/2019) 30 g 1  . HYDROcodone-acetaminophen (NORCO/VICODIN) 5-325 MG tablet Take 1 tablet by mouth every 6 (six) hours as needed for severe pain. (Patient not taking: Reported on 08/25/2019) 15 tablet 0   No facility-administered medications prior to visit.    Allergies  Allergen Reactions  . Tramadol Other (See Comments)     Unknown; pt claims she doesn't recall having this allergy     ROS Review of Systems    Objective:    Physical Exam Constitutional:      Comments: Pale and just not well appearance  HENT:     Head: Normocephalic and atraumatic.     Nose: Nose normal.     Mouth/Throat:     Mouth: Mucous membranes are moist.  Cardiovascular:     Rate and Rhythm: Normal rate and regular rhythm.     Pulses: Normal pulses.          Dorsalis pedis pulses are 2+ on the right side and 2+ on the left side.       Posterior tibial pulses are 2+ on the right side and 2+ on the left side.     Heart sounds: Normal heart sounds.  Pulmonary:     Effort: Pulmonary effort is normal.     Breath sounds: Normal breath sounds.  Abdominal:     General: Bowel sounds are normal.     Palpations: Abdomen is soft.  Musculoskeletal:        General: Normal range of motion.     Cervical back: Normal range of motion.       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     Capillary Refill: Capillary refill takes less than 2 seconds.     Coloration: Skin is pale.  Neurological:     Mental Status: She is alert and oriented to person, place, and time.  Psychiatric:     Comments: Questionable judgment due to noncompliance      BP 116/68   Pulse 80   Temp 98.1 F (36.7 C) (Temporal)   Ht 5\' 4"  (1.626 m)   Wt 158 lb (71.7 kg)   SpO2 100%   BMI 27.12 kg/m  Wt Readings from Last 3 Encounters:  08/25/19 158 lb (71.7 kg)  06/26/19 152 lb (68.9 kg)  06/12/19 167 lb 15.9 oz (76.2 kg)     Health Maintenance Due  Topic Date Due  . INFLUENZA VACCINE  08/03/2019    There are no preventive care reminders to display for this patient.  Lab Results  Component Value Date   TSH 2.580 06/03/2018   Lab Results  Component Value Date   WBC 11.7 (H) 06/26/2019   HGB 13.6 06/26/2019   HCT 40.0 06/26/2019   MCV 85.2 06/26/2019   PLT 424 (H) 06/26/2019    Lab Results  Component Value Date   NA 138 06/26/2019   K 4.5 06/26/2019   CO2 21 (L) 06/26/2019   GLUCOSE 244 (H) 06/26/2019   BUN 18 06/26/2019   CREATININE 0.88 06/26/2019   BILITOT 1.1 06/26/2019   ALKPHOS 57 06/26/2019   AST 11 (L) 06/26/2019   ALT 16 06/26/2019   PROT 6.4 (L) 06/26/2019   ALBUMIN 2.8 (L) 06/26/2019   CALCIUM 8.5 (L) 06/26/2019   ANIONGAP 12 06/26/2019   Lab Results  Component Value Date   CHOL 310 (H) 05/19/2019   Lab Results  Component Value Date   HDL 61 05/19/2019   Lab Results  Component Value Date   LDLCALC 176 (H) 05/19/2019   Lab Results  Component Value Date   TRIG 376 (H) 05/19/2019   Lab Results  Component Value Date   CHOLHDL 5.1 (H) 05/19/2019   Lab Results  Component Value Date   HGBA1C 11.0 (H) 06/11/2019      Assessment & Plan:   Problem List Items Addressed This Visit      Cardiovascular and Mediastinum   Hypertension     Endocrine   Uncontrolled diabetes mellitus due to underlying condition with diabetic retinopathy (HCC) - Primary (Chronic) Encourage compliance with current treatment regimen   Encourage regular CBG monitoring Encourage contacting office if excessive hyperglycemia and or hypoglycemia Lifestyle modification with healthy diet (fewer calories, more high fiber foods, whole grains and non-starchy vegetables, lower fat meat and fish, low-fat diary include healthy oils) regular exercise (physical activity) Opthalmology exam discussed  Nutritional consult recommended Regular dental visits encouraged Home BP monitoring also encouraged goal <130/80     PCOS (polycystic ovarian syndrome)   Relevant Orders   Hormone Panel    Other Visit Diagnoses    History of hyperlipidemia    Continue with current regimen    Relevant Orders   Lipid panel   Screening mammogram, encounter for       Relevant Orders   Ambulatory Referral to [facility]      Meds ordered this encounter  Medications  . albuterol  (VENTOLIN HFA) 108 (90 Base) MCG/ACT inhaler    Sig: Inhale 2 puffs into the lungs every 6 (six) hours as needed for wheezing or shortness of breath.    Dispense:  8 g    Refill:  11  . pantoprazole (PROTONIX) 40 MG tablet    Sig: Take 1 tablet (40 mg total) by mouth daily.    Dispense:  90 tablet    Refill:  3    Order Specific Question:   Supervising Provider    Answer:   Quentin Angst [id number]    Follow-up: No follow-ups on file.    Barbette Merino, NP

## 2019-08-28 ENCOUNTER — Other Ambulatory Visit: Payer: Self-pay

## 2019-08-28 ENCOUNTER — Ambulatory Visit (INDEPENDENT_AMBULATORY_CARE_PROVIDER_SITE_OTHER): Payer: No Payment, Other | Admitting: Licensed Clinical Social Worker

## 2019-08-28 DIAGNOSIS — F418 Other specified anxiety disorders: Secondary | ICD-10-CM

## 2019-09-01 NOTE — Progress Notes (Signed)
   THERAPIST PROGRESS NOTE  Session Time: 55 min Virtual Visit via Video Note  I connected with Fidel Levy on 09/01/19 at 11:00 AM EDT by a video enabled telemedicine application and verified that I am speaking with the correct person using two identifiers.  Location: Patient: Home Provider: [facility]   I discussed the limitations of evaluation and management by telemedicine and the availability of in person appointments. The patient expressed understanding and agreed to proceed.  I discussed the assessment and treatment plan with the patient. The patient was provided an opportunity to ask questions and all were answered. The patient agreed with the plan and demonstrated an understanding of the instructions.   I provided 55 min minutes of non-face-to-face time during this encounter. Hermine Messick, LCSW    Participation Level: Active  Behavioral Response: CasualAlertDepressed and Dysphoric  Type of Therapy: Individual Therapy  Treatment Goals addressed: Coping  Interventions: Motivational Interviewing and Supportive  Summary: ARILYN BRIERLEY is a 49 y.o. female who presents with hx of anx/dep. Pt signs on for video session per her preference. First part of session spent discussing scheduling with need to move to q other wk and unintended gap for next session. Pt verbalizes understanding. Pt presents as depressed and dysphoric today. She states she did not write out her list of decisions/goals as decided on last session. Pt reports she has been feeling "overwhelmed" with all the components of her life, poor health and relationships. Pt states she knows "my marriage is broken". She says she knows she needs to separate from her husband but can't seem to follow through d/t "I don't want to be alone and I don't want him to be alone". Pt further states "I'm not sure how I can function without him". She speaks of her almost total dependence because of her health and sight limitations. She  says "No one will want me". Pt speaks of her emotional attachment to 47 yr old, Marya Amsler in [place] and her local relationship, Merry Proud, whom she met on [place] when seperated. Merry Proud is 1 yr older than pt and "he keeps asking to hang out". Merry Proud has reportedly never been married, had a long term relationship, has one dtr. He lives in a house that belongs to his parents and works as a Radio broadcast assistant. Pt admits she is interested in [place]. Pt and spouse have agreed to see other people as desired. Counseling provided on the complexity of change when needs are being met one is unsure how to fulfill otherwise. Pt again agrees to write out a list of all the decisions she is faced with by next session. Pt states she was not able to connect with SHIP when asked. LCSW provided phone contact. Also discussed possible resources for home remodel work for the disabled. Reviewed coping strategies and reminded pt of 24/7 walk in crisis unit at [facility] as needed. Reviewed poc with pt's verbal agreement prior to close of session. Pt states appreciation for care.       Suicidal/Homicidal: Nowithout intent/plan  Therapist Response: Pt remains receptive to care.  Plan: Return again for next available appt.  Diagnosis: Axis I: Anxiety with Depression    Axis II: Deferred  Hermine Messick, LCSW 09/01/2019

## 2019-09-05 LAB — LIPID PANEL
Chol/HDL Ratio: 5.1 ratio — ABNORMAL HIGH (ref 0.0–4.4)
Cholesterol, Total: 297 mg/dL — ABNORMAL HIGH (ref 100–199)
HDL: 58 mg/dL (ref >39)
LDL Chol Calc (NIH): 173 mg/dL — ABNORMAL HIGH (ref 0–99)
Triglycerides: 344 mg/dL — ABNORMAL HIGH (ref 0–149)
VLDL Cholesterol Cal: 66 mg/dL — ABNORMAL HIGH (ref 5–40)

## 2019-09-05 LAB — HORMONE PANEL (T4,TSH,FSH,TESTT,SHBG,DHEA,ETC)
DHEA-Sulfate, LCMS: 13 ug/dL
Estradiol, Serum, MS: 95 pg/mL
Estrone Sulfate: 14 ng/dL
Follicle Stimulating Hormone: 6.2 m[IU]/mL
Free T-3: 2.5 pg/mL
Free Testosterone, Serum: 1.1 pg/mL
Progesterone, Serum: 190 ng/dL
Sex Hormone Binding Globulin: 147.8 nmol/L — ABNORMAL HIGH
T4: 9.4 ug/dL
TSH: 1.6 uU/mL
Testosterone, Serum (Total): 28 ng/dL
Testosterone-% Free: 0.4 %
Triiodothyronine (T-3), Serum: 103 ng/dL

## 2019-09-09 ENCOUNTER — Other Ambulatory Visit: Payer: Self-pay

## 2019-09-09 ENCOUNTER — Encounter (HOSPITAL_COMMUNITY): Payer: Self-pay | Admitting: Psychiatry

## 2019-09-09 ENCOUNTER — Telehealth (INDEPENDENT_AMBULATORY_CARE_PROVIDER_SITE_OTHER): Payer: No Payment, Other | Admitting: Psychiatry

## 2019-09-09 DIAGNOSIS — F411 Generalized anxiety disorder: Secondary | ICD-10-CM

## 2019-09-09 DIAGNOSIS — F900 Attention-deficit hyperactivity disorder, predominantly inattentive type: Secondary | ICD-10-CM | POA: Diagnosis not present

## 2019-09-09 DIAGNOSIS — F314 Bipolar disorder, current episode depressed, severe, without psychotic features: Secondary | ICD-10-CM | POA: Diagnosis not present

## 2019-09-09 MED ORDER — HYDROXYZINE HCL 10 MG PO TABS: 10.0000 mg | ORAL_TABLET | Freq: Three times a day (TID) | ORAL | 2 refills | Status: DC | PRN

## 2019-09-09 MED ORDER — ALPRAZOLAM 0.5 MG PO TABS: 0.5000 mg | ORAL_TABLET | Freq: Two times a day (BID) | ORAL | 1 refills | Status: DC | PRN

## 2019-09-09 MED ORDER — AMPHETAMINE-DEXTROAMPHETAMINE 20 MG PO TABS: 40.0000 mg | ORAL_TABLET | Freq: Every day | ORAL | 0 refills | Status: DC

## 2019-09-09 MED ORDER — FLUOXETINE HCL 40 MG PO CAPS: 80.0000 mg | ORAL_CAPSULE | Freq: Every morning | ORAL | 2 refills | Status: DC

## 2019-09-09 MED ORDER — QUETIAPINE FUMARATE 100 MG PO TABS: 100.0000 mg | ORAL_TABLET | Freq: Every day | ORAL | 2 refills | Status: DC

## 2019-09-09 NOTE — Progress Notes (Signed)
BH MD/PA/NP OP Progress Note Virtual Visit via Video Note  I connected with [name] on 09/09/19 at  4:00 PM EDT by a video enabled telemedicine application and verified that I am speaking with the correct person using two identifiers.  Location: Patient: Home Provider: Clinic   I discussed the limitations of evaluation and management by telemedicine and the availability of in person appointments. The patient expressed understanding and agreed to proceed.  I provided 30 minutes of non-face-to-face time during this encounter.    09/09/2019 5:38 PM [name]  MRN:  [id number]  Chief Complaint:  "I've been really overwhelmed"  HPI: 49 year old female seen today for followup psychiatric evaluation.   She has a  psychiatric history of PTSD, bipolar disorder, anxiety, ADHD, depression. She is currently prescribed Prozac 40 mg daily, Adderall 15 mg BID, Buspar 10 mg three times daily, and Xanax 1 mg PRN.  Patient is also prescribed Gabapentin however receives it from her PCP. Today she reports that Buspar was ineffective after taking it for a month and notes that she discontinued it.    During assessment patient was pleasant, calm, cooperative, and she maintained eye contact. Patient notes that she is experiencing increased anxiety and depression surrounding life stressors. She informed Clinical research associate that she plans to visit [place] in October and notes that her husband wants to go. She notes that she needs a mental break and time away to focus on herself. She also informed Clinical research associate that she began to cut her self to cope with anxiety. She endorses passive suicidal thoughts however notes that at this time she does not want to harm herself. Patient contracts for safety and denies HI/VH. She however endorses auditory hallucinations and notes she hears people mumbling.   Patient also reports that she is still having problems concentrating. She notes that she is disorganized, forgetful, and notes that  she avoids task that are mentally taxing.   Patient is agreeable to starting Seroquel 100 mg to help manage mood and AH. She is also agreeable to starting hydroxyzine 10 mg as needed as well as take Xanax 0.5 mg twice daily (instead of 1 mg daily) to  help with symptoms of anxiety. Patient also agreeable to increase Addarall 15 mg BID to 20 mg BID to help manage symptoms of ADHD.   She will continue all other medications as prescribed.  Patient notes that she has found therapy to be effective and is looking forward to returning.  No other concerns noted at this time  Visit Diagnosis:    ICD-10-CM   1. Generalized anxiety disorder  F41.1 ALPRAZolam (XANAX) 0.5 MG tablet    hydrOXYzine (ATARAX/VISTARIL) 10 MG tablet  2. Bipolar affective disorder, depressed, severe (HCC)  F31.4 FLUoxetine (PROZAC) 40 MG capsule    QUEtiapine (SEROQUEL) 100 MG tablet  3. Attention deficit hyperactivity disorder (ADHD), predominantly inattentive type  F90.0 amphetamine-dextroamphetamine (ADDERALL) 20 MG tablet    Past Psychiatric History: PTSD, Bipolar disorder, anxiety, ADHD, depression  Past Medical History:  Past Medical History:  Diagnosis Date  . Anxiety   . Depression   . Diabetes mellitus without complication (HCC)   . High cholesterol   . Hypertension   . Neuropathy   . PCOS (polycystic ovarian syndrome)   . Type 1 diabetes [facility])     Past Surgical History:  Procedure Laterality Date  . AMPUTATION Right 06/12/2019   Procedure: AMPUTATION RAY RIGHT GREAT TOE;  Surgeon: Toni Arthurs, MD;  Location: [facility] OR;  Service:  Orthopedics;  Laterality: Right;  . ELBOW SURGERY    . INSERTION OF AHMED VALVE Right 09/07/2015   Procedure: INSERTION OF AHMED VALVE;  Surgeon: Edmon Crape, MD;  Location: [facility] OR;  Service: Ophthalmology;  Laterality: Right;  . LASER PHOTO ABLATION Right 09/07/2015   Procedure: LASER PHOTO ABLATION;  Surgeon: Edmon Crape, MD;  Location: [facility] OR;  Service: Ophthalmology;  Laterality:  Right;  . PARS PLANA VITRECTOMY Right 09/07/2015   Procedure: PARS PLANA VITRECTOMY WITH 25 GAUGE,, with panretinal endolaser,;  Surgeon: Edmon Crape, MD;  Location: [facility] OR;  Service: Ophthalmology;  Laterality: Right;    Family Psychiatric History: Mother schizophrenia, bipolar, anxiety, and depression   Family History:  Family History  Problem Relation Age of Onset  . Depression Mother   . Anxiety disorder Mother   . Diabetes Mother   . Diabetes Father   . Diabetes Brother     Social History:  Social History   Socioeconomic History  . Marital status: Married    Spouse name: Not on file  . Number of children: Not on file  . Years of education: Not on file  . Highest education level: Not on file  Occupational History  . Not on file  Tobacco Use  . Smoking status: Former Smoker    Types: Cigarettes    Quit date: 09/12/2000    Years since quitting: 19.0  . Smokeless tobacco: Never Used  Vaping Use  . Vaping Use: Never used  Substance and Sexual Activity  . Alcohol use: Not Currently    Comment: occasional  . Drug use: Yes    Types: Marijuana    Comment: daily   . Sexual activity: Yes    Birth control/protection: None, Condom  Other Topics Concern  . Not on file  Social History Narrative  . Not on file   Social Determinants of Health   Financial Resource Strain:   . Difficulty of Paying Living Expenses: Not on file  Food Insecurity:   . Worried About Programme researcher, broadcasting/film/video in the Last Year: Not on file  . Ran Out of Food in the Last Year: Not on file  Transportation Needs:   . Lack of Transportation (Medical): Not on file  . Lack of Transportation (Non-Medical): Not on file  Physical Activity:   . Days of Exercise per Week: Not on file  . Minutes of Exercise per Session: Not on file  Stress:   . Feeling of Stress : Not on file  Social Connections:   . Frequency of Communication with Friends and Family: Not on file  . Frequency of Social Gatherings with  Friends and Family: Not on file  . Attends Religious Services: Not on file  . Active Member of Clubs or Organizations: Not on file  . Attends Banker Meetings: Not on file  . Marital Status: Not on file    Allergies:  Allergies  Allergen Reactions  . Tramadol Other (See Comments)    Unknown; pt claims she doesn't recall having this allergy     Metabolic Disorder Labs: Lab Results  Component Value Date   HGBA1C 11.0 (H) 06/11/2019   MPG 269 06/11/2019   MPG 220.21 02/25/2019   No results found for: PROLACTIN Lab Results  Component Value Date   CHOL 297 (H) 08/25/2019   TRIG 344 (H) 08/25/2019   HDL 58 08/25/2019   CHOLHDL 5.1 (H) 08/25/2019   VLDL 55 (H) 09/18/2013   LDLCALC 173 (H) 08/25/2019  LDLCALC 176 (H) 05/19/2019   Lab Results  Component Value Date   TSH 2.580 06/03/2018    Therapeutic Level Labs: Lab Results  Component Value Date   LITHIUM 0.42 (L) 08/13/2014   LITHIUM 0.83 08/12/2014   Lab Results  Component Value Date   VALPROATE <10.0 (L) 08/24/2013   VALPROATE <10.0 (L) 08/21/2013   No components found for:  CBMZ  Current Medications: Current Outpatient Medications  Medication Sig Dispense Refill  . acetaminophen (TYLENOL) 325 MG tablet Take 2 tablets (650 mg total) by mouth every 6 (six) hours as needed for mild pain (or Fever >/= 101).    Marland Kitchen albuterol (VENTOLIN HFA) 108 (90 Base) MCG/ACT inhaler Inhale 2 puffs into the lungs every 6 (six) hours as needed for wheezing or shortness of breath. 8 g 11  . ALPRAZolam (XANAX) 0.5 MG tablet Take 1 tablet (0.5 mg total) by mouth 2 (two) times daily as needed for anxiety. 30 tablet 1  . amphetamine-dextroamphetamine (ADDERALL) 20 MG tablet Take 2 tablets (40 mg total) by mouth daily. 30 tablet 0  . Artificial Tear Ointment (DRY EYES OP) Place 1-2 drops into both eyes daily as needed (dry eyes).     Marland Kitchen aspirin EC 81 MG tablet Take 1 tablet (81 mg total) by mouth 2 (two) times daily. 60 tablet  2  . atorvastatin (LIPITOR) 40 MG tablet Take 1 tablet (40 mg total) by mouth every evening. (Patient taking differently: Take 40 mg by mouth every morning. ) 90 tablet 2  . famotidine (PEPCID) 20 MG tablet Take 1 tablet by mouth twice daily 30 tablet 3  . FLUoxetine (PROZAC) 40 MG capsule Take 2 capsules (80 mg total) by mouth every morning. 60 capsule 2  . gabapentin (NEURONTIN) 800 MG tablet TAKE 2 TABLETS BY MOUTH IN THE MORNING AND 3 IN THE EVENING 150 tablet 0  . glucose blood (ONE TOUCH ULTRA TEST) test strip 1 each by Other route 2 (two) times daily. And lancets 2/day. 100 each 0  . hydrOXYzine (ATARAX/VISTARIL) 10 MG tablet Take 1 tablet (10 mg total) by mouth 3 (three) times daily as needed. 90 tablet 2  . insulin aspart protamine- aspart (NOVOLOG MIX 70/30) (70-30) 100 UNIT/ML injection Inject 0.07 mLs (7 Units total) into the skin 2 (two) times daily with a meal. 30 mL 5  . Insulin Syringe-Needle U-100 (INSULIN SYRINGE .5CC/30GX1/2") 30G X 1/2" 0.5 ML MISC 1 each by Does not apply route every 12 (twelve) hours. 100 each 1  . metFORMIN (GLUCOPHAGE) 1000 MG tablet TAKE 1 TABLET BY MOUTH TWICE DAILY WITH  A  MEAL. 180 tablet 0  . Multiple Vitamins-Minerals (MULTIVITAMIN GUMMIES ADULT PO) Take 1 capsule by mouth daily.    . naproxen sodium (ALEVE) 220 MG tablet Take 220 mg by mouth daily as needed (headache).    . pantoprazole (PROTONIX) 40 MG tablet Take 1 tablet (40 mg total) by mouth daily. 90 tablet 3  . QUEtiapine (SEROQUEL) 100 MG tablet Take 1 tablet (100 mg total) by mouth at bedtime. 30 tablet 2  . spironolactone (ALDACTONE) 100 MG tablet Take 1 tablet by mouth once daily 90 tablet 0   No current facility-administered medications for this visit.     Musculoskeletal: Strength & Muscle Tone: Unable to assess due to telehealth visit Gait & Station: Unable to assess due to telehealth visit Patient leans: N/A  Psychiatric Specialty Exam: Review of Systems  There were no vitals  taken for this visit.There is no  height or weight on file to calculate BMI.  General Appearance: Well Groomed  Eye Contact:  Good  Speech:  Clear and Coherent and Normal Rate  Volume:  Normal  Mood:  Anxious and Depressed  Affect:  Congruent  Thought Process:  Coherent, Goal Directed and Linear  Orientation:  Full (Time, Place, and Person)  Thought Content: WDL and Logical   Suicidal Thoughts:  No  Homicidal Thoughts:  No  Memory:  Immediate;   Good Recent;   Good Remote;   Good  Judgement:  Good  Insight:  Good  Psychomotor Activity:  Normal  Concentration:  Concentration: Good and Attention Span: Good  Recall:  Good  Fund of Knowledge: Good  Language: Good  Akathisia:  No  Handed:  Right  AIMS (if indicated): Not done  Assets:  Communication Skills Desire for Improvement Financial Resources/Insurance Housing  ADL's:  Intact  Cognition: WNL  Sleep:  Good   Screenings: AUDIT     [facility]  Alcohol Use Disorder Identification Test Final Score (AUDIT) 10    PHQ2-9     [facility]  PHQ-2 Total Score 0 1 4  PHQ-9 Total Score -- -- 13       Assessment and Plan: Patient endorses symptoms of anxiety, depression, and ADHD. She is agreeable to starting Seroquel 100 mg to help manage mood and AH. She is also agreeable to starting hydroxyzine 10 mg as needed as well as take Xanax 0.5 mg twice daily (instead of 1 mg daily) to  help with symptoms of anxiety. Patient also agreeable to increase Addarall 15 mg BID to 20 mg BID to help manage symptoms of ADHD.   She will continue all other medications as prescribed.  1. Bipolar affective disorder, depressed, severe (HCC)  Continue- FLUoxetine (PROZAC) 40 MG capsule; Take 2 capsules  (80 mg total) by mouth every morning.  Dispense: 60 capsule; Refill: 2 Start- QUEtiapine (SEROQUEL) 100 MG tablet; Take 1 tablet (100 mg total) by mouth at bedtime.  Dispense: 30 tablet; Refill: 2  2. Generalized anxiety disorder  Continue- ALPRAZolam (XANAX) 0.5 MG tablet; Take 1 tablet (0.5 mg total) by mouth 2 (two) times daily as needed for anxiety.  Dispense: 30 tablet; Refill: 1 Start- hydrOXYzine (ATARAX/VISTARIL) 10 MG tablet; Take 1 tablet (10 mg total) by mouth 3 (three) times daily as needed.  Dispense: 90 tablet; Refill: 2  3. Attention deficit hyperactivity disorder (ADHD), predominantly inattentive type  Increased- amphetamine-dextroamphetamine (ADDERALL) 20 MG tablet; Take 2 tablets (40 mg total) by mouth daily.  Dispense: 30 tablet; Refill: 0       Follow-up in 2 months Follow-up with therapy   Shanna CiscoBrittney E Jaquavis Felmlee, NP 09/09/2019, 5:38 PM

## 2019-10-01 ENCOUNTER — Ambulatory Visit: Payer: Self-pay | Admitting: Nurse Practitioner

## 2019-10-06 ENCOUNTER — Encounter: Payer: Self-pay | Admitting: Nurse Practitioner

## 2019-10-06 ENCOUNTER — Ambulatory Visit (INDEPENDENT_AMBULATORY_CARE_PROVIDER_SITE_OTHER): Payer: No Payment, Other | Admitting: Nurse Practitioner

## 2019-10-06 ENCOUNTER — Other Ambulatory Visit: Payer: Self-pay

## 2019-10-06 DIAGNOSIS — H9201 Otalgia, right ear: Secondary | ICD-10-CM

## 2019-10-06 MED ORDER — HYDROCORTISONE-ACETIC ACID 1-2 % OT SOLN: 3.0000 [drp] | OTIC | 0 refills | Status: AC

## 2019-10-06 NOTE — Patient Instructions (Signed)

## 2019-10-06 NOTE — Progress Notes (Signed)
[facility] [address] [place], [place]  [address] Phone:  [phone]   Fax:  [phone]    Acute Office Visit  Subjective:    Patient ID: [name], female    DOB: 1970-01-11, 49 y.o.   MRN: [id number]  Chief Complaint  Patient presents with   Otalgia    having right ear pain, started months ago started getting worser in the last few days    HPI Patient is in today for ea pain. She  has a past medical history of Anxiety, Depression, Diabetes mellitus without complication (HCC), High cholesterol, Hypertension, Neuropathy, PCOS (polycystic ovarian syndrome), and Type 1 diabetes (HCC).   Ear Pain Patient complains of right ear pain. Symptoms have been present several weeks. She also notes decreased hearing in the right ear and increasing pain in the right ear. Sharp shooting pain that comes and goes. She has some balance issues. She does have uncontrolled DM due to non compliance with treatment. This is the patients choice due to underlying image disturbance.  She does not have a history of ear infections. She does not have a history of recent swimming. She has tried no medications  for her symptoms. She has been recently treated with OTC APAP.    Past Medical History:  Diagnosis Date   Anxiety    Depression    Diabetes mellitus without complication (HCC)    High cholesterol    Hypertension    Neuropathy    PCOS (polycystic ovarian syndrome)    Type 1 diabetes (HCC)     Past Surgical History:  Procedure Laterality Date   AMPUTATION Right 06/12/2019   Procedure: AMPUTATION RAY RIGHT GREAT TOE;  Surgeon: Toni ArthursHewitt, John, MD;  Location: [facility] OR;  Service: Orthopedics;  Laterality: Right;   ELBOW SURGERY     INSERTION OF AHMED VALVE Right 09/07/2015   Procedure: INSERTION OF AHMED VALVE;  Surgeon: Edmon CrapeGary A Rankin, MD;  [facility];  Service: Ophthalmology;  Laterality: Right;   LASER PHOTO ABLATION Right 09/07/2015   Procedure: LASER  PHOTO ABLATION;  Surgeon: Edmon CrapeGary A Rankin, MD;  [facility];  Service: Ophthalmology;  Laterality: Right;   PARS PLANA VITRECTOMY Right 09/07/2015   Procedure: PARS PLANA VITRECTOMY WITH 25 GAUGE,, with panretinal endolaser,;  Surgeon: Edmon CrapeGary A Rankin, MD;  Location: [facility] OR;  Service: Ophthalmology;  Laterality: Right;    Family History  Problem Relation Age of Onset   Depression Mother    Anxiety disorder Mother    Diabetes Mother    Diabetes Father    Diabetes Brother     Social History   Socioeconomic History   Marital status: Married    Spouse name: Not on file   Number of children: Not on file   Years of education: Not on file   Highest education level: Not on file  Occupational History   Not on file  Tobacco Use   Smoking status: Former Smoker    Types: Cigarettes    Quit date: 09/12/2000    Years since quitting: 19.0   Smokeless tobacco: Never Used  Vaping Use   Vaping Use: Never used  Substance and Sexual Activity   Alcohol use: Not Currently    Comment: occasional   Drug use: Yes    Types: Marijuana    Comment: daily    Sexual activity: Yes    Birth control/protection: None, Condom  Other Topics Concern   Not on file  Social History  Narrative   Not on file   Social Determinants of Health   Financial Resource Strain:    Difficulty of Paying Living Expenses: Not on file  Food Insecurity:    Worried About Running Out of Food in the Last Year: Not on file   Ran Out of Food in the Last Year: Not on file  Transportation Needs:    Lack of Transportation (Medical): Not on file   Lack of Transportation (Non-Medical): Not on file  Physical Activity:    Days of Exercise per Week: Not on file   Minutes of Exercise per Session: Not on file  Stress:    Feeling of Stress : Not on file  Social Connections:    Frequency of Communication with Friends and Family: Not on file   Frequency of Social Gatherings with Friends and Family: Not on  file   Attends Religious Services: Not on file   Active Member of Clubs or Organizations: Not on file   Attends Banker Meetings: Not on file   Marital Status: Not on file  Intimate Partner Violence:    Fear of Current or Ex-Partner: Not on file   Emotionally Abused: Not on file   Physically Abused: Not on file   Sexually Abused: Not on file    Outpatient Medications Prior to Visit  Medication Sig Dispense Refill   acetaminophen (TYLENOL) 325 MG tablet Take 2 tablets (650 mg total) by mouth every 6 (six) hours as needed for mild pain (or Fever >/= 101).     albuterol (VENTOLIN HFA) 108 (90 Base) MCG/ACT inhaler Inhale 2 puffs into the lungs every 6 (six) hours as needed for wheezing or shortness of breath. 8 g 11   ALPRAZolam (XANAX) 0.5 MG tablet Take 1 tablet (0.5 mg total) by mouth 2 (two) times daily as needed for anxiety. 30 tablet 1   amphetamine-dextroamphetamine (ADDERALL) 20 MG tablet Take 2 tablets (40 mg total) by mouth daily. 30 tablet 0   Artificial Tear Ointment (DRY EYES OP) Place 1-2 drops into both eyes daily as needed (dry eyes).      aspirin EC 81 MG tablet Take 1 tablet (81 mg total) by mouth 2 (two) times daily. 60 tablet 2   atorvastatin (LIPITOR) 40 MG tablet Take 1 tablet (40 mg total) by mouth every evening. (Patient taking differently: Take 40 mg by mouth every morning. ) 90 tablet 2   famotidine (PEPCID) 20 MG tablet Take 1 tablet by mouth twice daily 30 tablet 3   FLUoxetine (PROZAC) 40 MG capsule Take 2 capsules (80 mg total) by mouth every morning. 60 capsule 2   gabapentin (NEURONTIN) 800 MG tablet TAKE 2 TABLETS BY MOUTH IN THE MORNING AND 3 IN THE EVENING 150 tablet 0   glucose blood (ONE TOUCH ULTRA TEST) test strip 1 each by Other route 2 (two) times daily. And lancets 2/day. 100 each 0   hydrOXYzine (ATARAX/VISTARIL) 10 MG tablet Take 1 tablet (10 mg total) by mouth 3 (three) times daily as needed. 90 tablet 2   insulin  aspart protamine- aspart (NOVOLOG MIX 70/30) (70-30) 100 UNIT/ML injection Inject 0.07 mLs (7 Units total) into the skin 2 (two) times daily with a meal. 30 mL 5   Insulin Syringe-Needle U-100 (INSULIN SYRINGE .5CC/30GX1/2") 30G X 1/2" 0.5 ML MISC 1 each by Does not apply route every 12 (twelve) hours. 100 each 1   metFORMIN (GLUCOPHAGE) 1000 MG tablet TAKE 1 TABLET BY MOUTH TWICE DAILY WITH  A  MEAL. 180 tablet 0   Multiple Vitamins-Minerals (MULTIVITAMIN GUMMIES ADULT PO) Take 1 capsule by mouth daily.     naproxen sodium (ALEVE) 220 MG tablet Take 220 mg by mouth daily as needed (headache).     pantoprazole (PROTONIX) 40 MG tablet Take 1 tablet (40 mg total) by mouth daily. 90 tablet 3   QUEtiapine (SEROQUEL) 100 MG tablet Take 1 tablet (100 mg total) by mouth at bedtime. 30 tablet 2   spironolactone (ALDACTONE) 100 MG tablet Take 1 tablet by mouth once daily 90 tablet 0   No facility-administered medications prior to visit.    Allergies  Allergen Reactions   Tramadol Other (See Comments)    Unknown; pt claims she doesn't recall having this allergy     Review of Systems     Objective:    Physical Exam HENT:     Head: Normocephalic.     Right Ear: Tympanic membrane, ear canal and external ear normal. There is no impacted cerumen.     Left Ear: Tympanic membrane, ear canal and external ear normal. There is no impacted cerumen.     There were no vitals taken for this visit. Wt Readings from Last 3 Encounters:  08/25/19 158 lb (71.7 kg)  06/26/19 152 lb (68.9 kg)  06/12/19 167 lb 15.9 oz (76.2 kg)    Health Maintenance Due  Topic Date Due   PAP SMEAR-Modifier  06/03/2019   INFLUENZA VACCINE  08/03/2019    There are no preventive care reminders to display for this patient.   Lab Results  Component Value Date   TSH 2.580 06/03/2018   Lab Results  Component Value Date   WBC 11.7 (H) 06/26/2019   HGB 13.6 06/26/2019   HCT 40.0 06/26/2019   MCV 85.2  06/26/2019   PLT 424 (H) 06/26/2019   Lab Results  Component Value Date   NA 138 06/26/2019   K 4.5 06/26/2019   CO2 21 (L) 06/26/2019   GLUCOSE 244 (H) 06/26/2019   BUN 18 06/26/2019   CREATININE 0.88 06/26/2019   BILITOT 1.1 06/26/2019   ALKPHOS 57 06/26/2019   AST 11 (L) 06/26/2019   ALT 16 06/26/2019   PROT 6.4 (L) 06/26/2019   ALBUMIN 2.8 (L) 06/26/2019   CALCIUM 8.5 (L) 06/26/2019   ANIONGAP 12 06/26/2019   Lab Results  Component Value Date   CHOL 297 (H) 08/25/2019   Lab Results  Component Value Date   HDL 58 08/25/2019   Lab Results  Component Value Date   LDLCALC 173 (H) 08/25/2019   Lab Results  Component Value Date   TRIG 344 (H) 08/25/2019   Lab Results  Component Value Date   CHOLHDL 5.1 (H) 08/25/2019   Lab Results  Component Value Date   HGBA1C 11.0 (H) 06/11/2019       Assessment & Plan:   Problem List Items Addressed This Visit    None    Visit Diagnoses    Otalgia of right ear    -  Primary Chronic complaint of right ear pain with no visual abnormality on exam. Slight hearing loss. Will trial ear drops however will consider ENT referral if not effective for additional evaluation       Meds ordered this encounter  Medications   acetic acid-hydrocortisone (VOSOL-HC) OTIC solution    Sig: Place 3 drops into the right ear every 4 (four) hours for 10 days.    Dispense:  10 mL    Refill:  0    Order Specific Question:   Supervising Provider    Answer:   Quentin Angst [[id number]]     Barbette Merino, NP

## 2019-10-10 ENCOUNTER — Telehealth (HOSPITAL_COMMUNITY): Payer: Self-pay | Admitting: *Deleted

## 2019-10-10 NOTE — Telephone Encounter (Signed)
Call from patient requesting RX for her Adderall. Reviewed record and she should be out of her med. She would like it called in to her preferred pharmacy. Will let Ms Doyne Keel NP know of her need.

## 2019-10-13 ENCOUNTER — Other Ambulatory Visit (HOSPITAL_COMMUNITY): Payer: Self-pay | Admitting: Psychiatry

## 2019-10-13 DIAGNOSIS — F900 Attention-deficit hyperactivity disorder, predominantly inattentive type: Secondary | ICD-10-CM

## 2019-10-13 MED ORDER — AMPHETAMINE-DEXTROAMPHETAMINE 15 MG PO TABS: 15.0000 mg | ORAL_TABLET | Freq: Two times a day (BID) | ORAL | 0 refills | Status: DC

## 2019-10-13 NOTE — Telephone Encounter (Signed)
Provider called patient and informed her that Adderall would be sent to her preferred pharmacy.  Patient was also told that Adderall dose will be lowered from 20 mg twice daily to 15 mg twice daily which is the highest recommended dose.  She endorsed understanding and agreement.

## 2019-10-18 ENCOUNTER — Other Ambulatory Visit (HOSPITAL_COMMUNITY): Payer: Self-pay | Admitting: Psychiatry

## 2019-10-18 ENCOUNTER — Other Ambulatory Visit: Payer: Self-pay | Admitting: Family Medicine

## 2019-10-18 ENCOUNTER — Other Ambulatory Visit: Payer: Self-pay | Admitting: Nurse Practitioner

## 2019-10-18 DIAGNOSIS — F418 Other specified anxiety disorders: Secondary | ICD-10-CM

## 2019-10-18 DIAGNOSIS — F411 Generalized anxiety disorder: Secondary | ICD-10-CM

## 2019-10-18 DIAGNOSIS — I10 Essential (primary) hypertension: Secondary | ICD-10-CM

## 2019-10-18 DIAGNOSIS — E1149 Type 2 diabetes mellitus with other diabetic neurological complication: Secondary | ICD-10-CM

## 2019-10-20 ENCOUNTER — Ambulatory Visit (HOSPITAL_COMMUNITY): Payer: No Payment, Other | Admitting: Licensed Clinical Social Worker

## 2019-10-20 NOTE — Telephone Encounter (Signed)
Please see medication request.

## 2019-10-29 ENCOUNTER — Ambulatory Visit (HOSPITAL_COMMUNITY): Payer: No Payment, Other | Admitting: Licensed Clinical Social Worker

## 2019-10-29 ENCOUNTER — Other Ambulatory Visit: Payer: Self-pay

## 2019-11-03 ENCOUNTER — Ambulatory Visit (HOSPITAL_COMMUNITY): Payer: Self-pay | Admitting: Licensed Clinical Social Worker

## 2019-11-04 ENCOUNTER — Ambulatory Visit: Payer: Medicaid Other | Attending: Internal Medicine

## 2019-11-04 DIAGNOSIS — Z23 Encounter for immunization: Secondary | ICD-10-CM

## 2019-11-04 NOTE — Progress Notes (Signed)
   Cov[facility] Vaccination [facility]  Name:  [name]    MRN: [id number] DOB: Dec 11, 1970  11/04/2019  [name] was observed post Covid-19 immunization for 15 minutes without incident. She was provided with Vaccine Information Sheet and instruction to access the V-Safe system.   [name] was instructed to call [phone] with any severe reactions post vaccine: Marland Kitchen Difficulty breathing  . Swelling of face and throat  . A fast heartbeat  . A bad rash all over body  . Dizziness and weakness

## 2019-11-10 ENCOUNTER — Other Ambulatory Visit: Payer: Self-pay

## 2019-11-10 ENCOUNTER — Encounter (HOSPITAL_COMMUNITY): Payer: Self-pay | Admitting: Psychiatry

## 2019-11-10 ENCOUNTER — Telehealth (INDEPENDENT_AMBULATORY_CARE_PROVIDER_SITE_OTHER): Payer: No Payment, Other | Admitting: Psychiatry

## 2019-11-10 DIAGNOSIS — F314 Bipolar disorder, current episode depressed, severe, without psychotic features: Secondary | ICD-10-CM

## 2019-11-10 DIAGNOSIS — F332 Major depressive disorder, recurrent severe without psychotic features: Secondary | ICD-10-CM | POA: Diagnosis not present

## 2019-11-10 DIAGNOSIS — F411 Generalized anxiety disorder: Secondary | ICD-10-CM | POA: Diagnosis not present

## 2019-11-10 DIAGNOSIS — F900 Attention-deficit hyperactivity disorder, predominantly inattentive type: Secondary | ICD-10-CM | POA: Diagnosis not present

## 2019-11-10 MED ORDER — TRAZODONE HCL 50 MG PO TABS: 50.0000 mg | ORAL_TABLET | Freq: Every day | ORAL | 2 refills | Status: DC

## 2019-11-10 MED ORDER — HYDROXYZINE HCL 25 MG PO TABS: 25.0000 mg | ORAL_TABLET | Freq: Three times a day (TID) | ORAL | 2 refills | Status: DC | PRN

## 2019-11-10 MED ORDER — FLUOXETINE HCL 40 MG PO CAPS: 80.0000 mg | ORAL_CAPSULE | Freq: Every morning | ORAL | 2 refills | Status: DC

## 2019-11-10 MED ORDER — ARIPIPRAZOLE 5 MG PO TABS: 5.0000 mg | ORAL_TABLET | Freq: Every day | ORAL | 2 refills | Status: DC

## 2019-11-10 MED ORDER — AMPHETAMINE-DEXTROAMPHETAMINE 15 MG PO TABS: 15.0000 mg | ORAL_TABLET | Freq: Two times a day (BID) | ORAL | 0 refills | Status: DC

## 2019-11-10 MED ORDER — ALPRAZOLAM 0.5 MG PO TABS: 0.5000 mg | ORAL_TABLET | Freq: Two times a day (BID) | ORAL | 0 refills | Status: DC | PRN

## 2019-11-10 NOTE — Progress Notes (Signed)
BH MD/PA/NP OP Progress Note Virtual Visit via Video Note  I connected with [name] BrimMichelle K Spera on 11/10/19 at 10:30 AM EST by a video enabled telemedicine application and verified that I am speaking with the correct person using two identifiers.  Location: Patient: Home Provider: Clinic   I discussed the limitations of evaluation and management by telemedicine and the availability of in person appointments. The patient expressed understanding and agreed to proceed.  I provided 30 minutes of non-face-to-face time during this encounter.    11/10/2019 11:04 AM [name]  MRN:  [id number]  Chief Complaint:  "I no longer hear the voices and I'm sleeping but I want to stop the Seroquel because i'm gaining weight"  HPI: 49 year old female seen today for followup psychiatric evaluation.   She has a  psychiatric history of PTSD, bipolar disorder, anxiety, ADHD, depression. She is currently prescribed Prozac 80 mg daily, Adderall 15 mg BID, Seroquel 100 mg nightly, hydroxyzine 10 mg three times daily as needed, and Xanax 0.5 mg twice daily PRN.  Patient is also prescribed Gabapentin however receives it from her PCP.   Today she is pleasant,cooperative, engaged in conversation, and she maintained eye contact.  She notes that her mood has improved since last visit however notes that she occasionally feels anxious and depressed.  A PHQ-9 was conducted and patient scored an 18.  A GAD-7 was also conducted and patient scored a 17.  She endorses anhedonia, poor concentration, weight gain, and increased anxiety.  She noted that most days she feels nervous, on edge, restless, and irritable.  She informed provider that since starting Seroquel her mood and sleep (sleeping 5-6 hours nightly) has improve but reports that she would like to discontinue Seroquel because it has caused increased weight gain.   She notes that she continues to have a poor appetite however has gained 10 pounds since starting Seroquel.   She notes that she wanted to stop however her husband encouraged her to keep taking the medication.  Patient notes that her auditory hallucination has also disappeared since starting Seroquel.  She informed provider that 2 weeks ago she cut herself to relieve stress however denies SI/HI/VH at this time.  She is agreeable to reducing Seroquel 100 mg to 50 mg for a week and then discontinuing it. She informed provider that she has enough seroquel to taper her dose over a week.  She will start Abilify 5mg once she has discontinue Seroquel  to help manage symptoms of depression. She also agreeable to increasing hydroxyzine 10 mg to 25 mg to help manage anxiety.  She will continue all other medications as prescribed.  She will also start Trazodone 25 mg to 50 mg as needed for sleep. She will follow-up with outpatient counselor for therapy.  No other concerns noted at this time.  Visit Diagnosis:    ICD-10-CM   1. Severe episode of recurrent major depressive disorder, without psychotic features (HCC)  F33.2 FLUoxetine (PROZAC) 40 MG capsule    ARIPiprazole (ABILIFY) 5 MG tablet  2. Bipolar affective disorder, depressed, severe (HCC)  F31.4 FLUoxetine (PROZAC) 40 MG capsule    ARIPiprazole (ABILIFY) 5 MG tablet    traZODone (DESYREL) 50 MG tablet  3. Generalized anxiety disorder  F41.1 hydrOXYzine (ATARAX/VISTARIL) 25 MG tablet    ALPRAZolam (XANAX) 0.5 MG tablet  4. Attention deficit hyperactivity disorder (ADHD), predominantly inattentive type  F90.0 amphetamine-dextroamphetamine (ADDERALL) 15 MG tablet    Past Psychiatric History: PTSD, Bipolar disorder, anxiety, ADHD, depression  Past Medical History:  Past Medical History:  Diagnosis Date  . Anxiety   . Depression   . Diabetes mellitus without complication (HCC)   . High cholesterol   . Hypertension   . Neuropathy   . PCOS (polycystic ovarian syndrome)   . Type 1 diabetes [facility])     Past Surgical History:  Procedure Laterality Date  .  AMPUTATION Right 06/12/2019   Procedure: AMPUTATION RAY RIGHT GREAT TOE;  Surgeon: Toni Arthurs, MD;  Location: [facility] OR;  Service: Orthopedics;  Laterality: Right;  . ELBOW SURGERY    . INSERTION OF AHMED VALVE Right 09/07/2015   Procedure: INSERTION OF AHMED VALVE;  Surgeon: Edmon Crape, MD;  Location: [facility] OR;  Service: Ophthalmology;  Laterality: Right;  . LASER PHOTO ABLATION Right 09/07/2015   Procedure: LASER PHOTO ABLATION;  Surgeon: Edmon Crape, MD;  Location: [facility] OR;  Service: Ophthalmology;  Laterality: Right;  . PARS PLANA VITRECTOMY Right 09/07/2015   Procedure: PARS PLANA VITRECTOMY WITH 25 GAUGE,, with panretinal endolaser,;  Surgeon: Edmon Crape, MD;  Location: [facility] OR;  Service: Ophthalmology;  Laterality: Right;    Family Psychiatric History: Mother schizophrenia, bipolar, anxiety, and depression   Family History:  Family History  Problem Relation Age of Onset  . Depression Mother   . Anxiety disorder Mother   . Diabetes Mother   . Diabetes Father   . Diabetes Brother     Social History:  Social History   Socioeconomic History  . Marital status: Married    Spouse name: Not on file  . Number of children: Not on file  . Years of education: Not on file  . Highest education level: Not on file  Occupational History  . Not on file  Tobacco Use  . Smoking status: Former Smoker    Types: Cigarettes    Quit date: 09/12/2000    Years since quitting: 19.1  . Smokeless tobacco: Never Used  Vaping Use  . Vaping Use: Never used  Substance and Sexual Activity  . Alcohol use: Not Currently    Comment: occasional  . Drug use: Yes    Types: Marijuana    Comment: daily   . Sexual activity: Yes    Birth control/protection: None, Condom  Other Topics Concern  . Not on file  Social History Narrative  . Not on file   Social Determinants of Health   Financial Resource Strain:   . Difficulty of Paying Living Expenses: Not on file  Food Insecurity:   . Worried About Community education officer in the Last Year: Not on file  . Ran Out of Food in the Last Year: Not on file  Transportation Needs:   . Lack of Transportation (Medical): Not on file  . Lack of Transportation (Non-Medical): Not on file  Physical Activity:   . Days of Exercise per Week: Not on file  . Minutes of Exercise per Session: Not on file  Stress:   . Feeling of Stress : Not on file  Social Connections:   . Frequency of Communication with Friends and Family: Not on file  . Frequency of Social Gatherings with Friends and Family: Not on file  . Attends Religious Services: Not on file  . Active Member of Clubs or Organizations: Not on file  . Attends Banker Meetings: Not on file  . Marital Status: Not on file    Allergies:  Allergies  Allergen Reactions  . Tramadol Other (See Comments)  Unknown; pt claims she doesn't recall having this allergy     Metabolic Disorder Labs: Lab Results  Component Value Date   HGBA1C 11.0 (H) 06/11/2019   MPG 269 06/11/2019   MPG 220.21 02/25/2019   No results found for: PROLACTIN Lab Results  Component Value Date   CHOL 297 (H) 08/25/2019   TRIG 344 (H) 08/25/2019   HDL 58 08/25/2019   CHOLHDL 5.1 (H) 08/25/2019   VLDL 55 (H) 09/18/2013   LDLCALC 173 (H) 08/25/2019   LDLCALC 176 (H) 05/19/2019   Lab Results  Component Value Date   TSH 2.580 06/03/2018    Therapeutic Level Labs: Lab Results  Component Value Date   LITHIUM 0.42 (L) 08/13/2014   LITHIUM 0.83 08/12/2014   Lab Results  Component Value Date   VALPROATE <10.0 (L) 08/24/2013   VALPROATE <10.0 (L) 08/21/2013   No components found for:  CBMZ  Current Medications: Current Outpatient Medications  Medication Sig Dispense Refill  . acetaminophen (TYLENOL) 325 MG tablet Take 2 tablets (650 mg total) by mouth every 6 (six) hours as needed for mild pain (or Fever >/= 101).    Marland Kitchen albuterol (VENTOLIN HFA) 108 (90 Base) MCG/ACT inhaler Inhale 2 puffs into the lungs every 6  (six) hours as needed for wheezing or shortness of breath. 8 g 11  . ALPRAZolam (XANAX) 0.5 MG tablet Take 1 tablet (0.5 mg total) by mouth 2 (two) times daily as needed for anxiety. 60 tablet 0  . amphetamine-dextroamphetamine (ADDERALL) 15 MG tablet Take 1 tablet by mouth 2 (two) times daily. 60 tablet 0  . ARIPiprazole (ABILIFY) 5 MG tablet Take 1 tablet (5 mg total) by mouth daily. 30 tablet 2  . Artificial Tear Ointment (DRY EYES OP) Place 1-2 drops into both eyes daily as needed (dry eyes).     Marland Kitchen aspirin EC 81 MG tablet Take 1 tablet (81 mg total) by mouth 2 (two) times daily. 60 tablet 2  . atorvastatin (LIPITOR) 40 MG tablet Take 1 tablet (40 mg total) by mouth every evening. (Patient taking differently: Take 40 mg by mouth every morning. ) 90 tablet 2  . famotidine (PEPCID) 20 MG tablet Take 1 tablet by mouth twice daily 30 tablet 3  . FLUoxetine (PROZAC) 40 MG capsule Take 2 capsules (80 mg total) by mouth every morning. 60 capsule 2  . gabapentin (NEURONTIN) 800 MG tablet TAKE 2 TABLETS BY MOUTH IN THE MORNING AND 3 TABLETS  IN THE EVENING 150 tablet 0  . glucose blood (ONE TOUCH ULTRA TEST) test strip 1 each by Other route 2 (two) times daily. And lancets 2/day. 100 each 0  . hydrOXYzine (ATARAX/VISTARIL) 25 MG tablet Take 1 tablet (25 mg total) by mouth 3 (three) times daily as needed. 90 tablet 2  . insulin aspart protamine- aspart (NOVOLOG MIX 70/30) (70-30) 100 UNIT/ML injection Inject 0.07 mLs (7 Units total) into the skin 2 (two) times daily with a meal. 30 mL 5  . Insulin Syringe-Needle U-100 (INSULIN SYRINGE .5CC/30GX1/2") 30G X 1/2" 0.5 ML MISC 1 each by Does not apply route every 12 (twelve) hours. 100 each 1  . metFORMIN (GLUCOPHAGE) 1000 MG tablet TAKE 1 TABLET BY MOUTH TWICE DAILY WITH  A  MEAL. 180 tablet 0  . Multiple Vitamins-Minerals (MULTIVITAMIN GUMMIES ADULT PO) Take 1 capsule by mouth daily.    . naproxen sodium (ALEVE) 220 MG tablet Take 220 mg by mouth daily as  needed (headache).    . pantoprazole (  PROTONIX) 40 MG tablet Take 1 tablet (40 mg total) by mouth daily. 90 tablet 3  . spironolactone (ALDACTONE) 100 MG tablet Take 1 tablet by mouth once daily 90 tablet 0  . traZODone (DESYREL) 50 MG tablet Take 1 tablet (50 mg total) by mouth at bedtime. 30 tablet 2   No current facility-administered medications for this visit.     Musculoskeletal: Strength & Muscle Tone: Unable to assess due to telehealth visit Gait & Station: Unable to assess due to telehealth visit Patient leans: N/A  Psychiatric Specialty Exam: Review of Systems  There were no vitals taken for this visit.There is no height or weight on file to calculate BMI.  General Appearance: Well Groomed  Eye Contact:  Good  Speech:  Clear and Coherent and Normal Rate  Volume:  Normal  Mood:  Anxious and Depressed  Affect:  Congruent  Thought Process:  Coherent, Goal Directed and Linear  Orientation:  Full (Time, Place, and Person)  Thought Content: WDL and Logical   Suicidal Thoughts:  No  Homicidal Thoughts:  No  Memory:  Immediate;   Good Recent;   Good Remote;   Good  Judgement:  Good  Insight:  Good  Psychomotor Activity:  Normal  Concentration:  Concentration: Good and Attention Span: Good  Recall:  Good  Fund of Knowledge: Good  Language: Good  Akathisia:  No  Handed:  Right  AIMS (if indicated): Not done  Assets:  Communication Skills Desire for Improvement Financial Resources/Insurance Housing  ADL's:  Intact  Cognition: WNL  Sleep:  Good   Screenings: AUDIT     [facility]  Alcohol Use Disorder Identification Test Final Score (AUDIT) 10    GAD-7     Video Visit from 11/10/2019 in [facility]  Total GAD-7 Score 17    PHQ2-9     Video Visit from 11/10/2019 in [facility] Office Visit from 05/19/2019 in [facility] Office Visit from 11/19/2018 in [facility] Office Visit from 06/03/2018 in [facility]  PHQ-2 Total Score 4 0 1 4  PHQ-9 Total Score 18 -- -- 13       Assessment and Plan: Patient noted that her mood and sleep has improved since starting Seroquel however notes that she would like to discontinue it because it has caused weight gain.  She also informed provider that she experiences anxiety and depression.  She is agreeable to reducing Seroquel 100 mg to 50 mg for a week and then discontinuing it. She informed provider that she has enough seroquel to taper her dose over a week.  She will start Abilify 5mg  once she has discontinue Seroquel  to help manage symptoms of depression. She also agreeable to increasing hydroxyzine 10 mg to 25 mg to help manage anxiety. She will also start Trazodone 25 mg to 50 mg as needed for sleep. She will continue all other medications as prescribed.    1. Bipolar affective disorder, depressed, severe (HCC)  Continue- FLUoxetine (PROZAC) 40 MG capsule; Take 2 capsules (80 mg total) by mouth every morning.  Dispense: 60 capsule; Refill: 2 Start- ARIPiprazole (ABILIFY) 5 MG tablet; Take 1 tablet (5 mg total) by mouth daily.  Dispense: 30 tablet; Refill: 2 Start- traZODone (DESYREL) 50 MG tablet; Take 1 tablet (50 mg total) by mouth at bedtime.  Dispense: 30 tablet; Refill: 2  2.  Generalized anxiety disorder  Increased- hydrOXYzine (ATARAX/VISTARIL) 25 MG tablet; Take 1 tablet (25 mg total) by mouth 3 (three) times daily as needed.  Dispense: 90 tablet; Refill: 2 Continue- ALPRAZolam (XANAX) 0.5 MG tablet; Take 1 tablet (0.5 mg total) by mouth 2 (two) times daily as needed for anxiety.  Dispense: 60 tablet; Refill: 0  3. Attention deficit hyperactivity disorder (ADHD), predominantly inattentive type  Continue- amphetamine-dextroamphetamine (ADDERALL) 15 MG tablet; Take 1 tablet by mouth 2 (two) times daily.  Dispense: 60  tablet; Refill: 0  4. Severe episode of recurrent major depressive disorder, without psychotic features (HCC)  Continue- FLUoxetine (PROZAC) 40 MG capsule; Take 2 capsules (80 mg total) by mouth every morning.  Dispense: 60 capsule; Refill: 2 Continue- ARIPiprazole (ABILIFY) 5 MG tablet; Take 1 tablet (5 mg total) by mouth daily.  Dispense: 30 tablet; Refill: 2       Follow-up in 19months Follow-up with therapy   Shanna Cisco, NP 11/10/2019, 11:04 AM

## 2019-11-13 ENCOUNTER — Telehealth (INDEPENDENT_AMBULATORY_CARE_PROVIDER_SITE_OTHER): Payer: Medicaid Other | Admitting: Nurse Practitioner

## 2019-11-13 DIAGNOSIS — R059 Cough, unspecified: Secondary | ICD-10-CM

## 2019-11-13 DIAGNOSIS — J069 Acute upper respiratory infection, unspecified: Secondary | ICD-10-CM | POA: Diagnosis not present

## 2019-11-13 MED ORDER — BENZONATATE 100 MG PO CAPS: 100.0000 mg | ORAL_CAPSULE | Freq: Three times a day (TID) | ORAL | 0 refills | Status: AC | PRN

## 2019-11-13 MED ORDER — AZITHROMYCIN 250 MG PO TABS: ORAL_TABLET | ORAL | 0 refills | Status: DC

## 2019-11-13 NOTE — Progress Notes (Signed)
   [facility] Patient [facility] [address] [place], [place]  [address] Phone:  [phone]   Fax:  [phone]  Virtual Visit via Telephone Note  I connected with [name] on 11/17/19 at  1:30 PM EST by telephone and verified that I am speaking with the correct person using two identifiers.   I discussed the limitations, risks, security and privacy concerns of performing an evaluation and management service by telephone and the availability of in person appointments. I also discussed with the patient that there may be a patient responsible charge related to this service. The patient expressed understanding and agreed to proceed.  Patient home Provider Office  History of Present Illness:   Upper Respiratory Infection Patient complains of symptoms of a URI. Symptoms include cough described as productive and post nasal drip. Onset of symptoms was 2 week ago, and has been gradually worsening since that time. Treatment to date: cough suppressants and decongestants. Denies headache, dizziness, visual changes, chest pain, nausea, vomiting or any edema.   Observations/Objective: No exam virtual visit  Assessment and Plan: Assessment  Primary Diagnosis & Pertinent Problem List: The primary encounter diagnosis was Upper respiratory tract infection, unspecified type. A diagnosis of Cough was also pertinent to this visit.  Visit Diagnosis: 1. Upper respiratory tract infection, unspecified type  Zpak continue OTC med for additional symptom relief   2. Cough  Benzonatate as directed    Follow Up Instructions: As scheduled   I discussed the assessment and treatment plan with the patient. The patient was provided an opportunity to ask questions and all were answered. The patient agreed with the plan and demonstrated an understanding of the instructions.   The patient was advised to call back or seek an in-person evaluation if the symptoms worsen or if the condition fails to  improve as anticipated.  I provided 10 minutes of non-face-to-face time during this encounter.   Barbette Merino, NP

## 2019-11-17 ENCOUNTER — Ambulatory Visit (HOSPITAL_COMMUNITY): Payer: Self-pay | Admitting: Licensed Clinical Social Worker

## 2019-11-17 ENCOUNTER — Encounter: Payer: Self-pay | Admitting: Nurse Practitioner

## 2019-11-26 ENCOUNTER — Ambulatory Visit (INDEPENDENT_AMBULATORY_CARE_PROVIDER_SITE_OTHER): Payer: Medicaid Other | Admitting: Nurse Practitioner

## 2019-11-26 ENCOUNTER — Other Ambulatory Visit: Payer: Self-pay

## 2019-11-26 ENCOUNTER — Encounter: Payer: Self-pay | Admitting: Nurse Practitioner

## 2019-11-26 VITALS — BP 134/73 | HR 74 | Temp 97.2°F | Resp 18 | Ht 64.0 in | Wt 169.8 lb

## 2019-11-26 DIAGNOSIS — Z23 Encounter for immunization: Secondary | ICD-10-CM

## 2019-11-26 DIAGNOSIS — Z9119 Patient's noncompliance with other medical treatment and regimen: Secondary | ICD-10-CM | POA: Diagnosis not present

## 2019-11-26 DIAGNOSIS — E1149 Type 2 diabetes mellitus with other diabetic neurological complication: Secondary | ICD-10-CM

## 2019-11-26 DIAGNOSIS — E119 Type 2 diabetes mellitus without complications: Secondary | ICD-10-CM | POA: Diagnosis not present

## 2019-11-26 DIAGNOSIS — H543 Unqualified visual loss, both eyes: Secondary | ICD-10-CM

## 2019-11-26 DIAGNOSIS — Z91199 Patient's noncompliance with other medical treatment and regimen due to unspecified reason: Secondary | ICD-10-CM

## 2019-11-26 DIAGNOSIS — I10 Essential (primary) hypertension: Secondary | ICD-10-CM

## 2019-11-26 LAB — POCT URINALYSIS DIP (CLINITEK)
Bilirubin, UA: NEGATIVE
Glucose, UA: 500 mg/dL — AB
Leukocytes, UA: NEGATIVE
Nitrite, UA: NEGATIVE
POC PROTEIN,UA: 100 — AB
Spec Grav, UA: 1.025 (ref 1.010–1.025)
Urobilinogen, UA: 0.2 E.U./dL
pH, UA: 6 (ref 5.0–8.0)

## 2019-11-26 LAB — POCT GLYCOSYLATED HEMOGLOBIN (HGB A1C): Hemoglobin A1C: 12.3 % — AB (ref 4.0–5.6)

## 2019-11-26 LAB — GLUCOSE, POCT (MANUAL RESULT ENTRY): POC Glucose: 267 mg/dl — AB (ref 70–99)

## 2019-11-26 MED ORDER — GABAPENTIN 800 MG PO TABS: ORAL_TABLET | ORAL | 11 refills | Status: DC

## 2019-11-26 MED ORDER — SPIRONOLACTONE 100 MG PO TABS: 100.0000 mg | ORAL_TABLET | Freq: Every day | ORAL | 11 refills | Status: DC

## 2019-11-26 NOTE — Progress Notes (Signed)
Needs gabapentin refill

## 2019-11-26 NOTE — Patient Instructions (Signed)
Diabetes Mellitus and Skin Care Diabetes (diabetes mellitus) can lead to health problems over time, including skin problems. People with diabetes have a higher risk for many types of skin complications. This is because having poorly controlled blood sugar (glucose) levels can:  Damage nerves and blood vessels. This can result in decreased feeling in your legs and feet, which means you may not notice minor skin injuries that could lead to serious problems.  Reduce blood flow (circulation), which makes wounds heal more slowly and increases your risk of infection.  Cause areas of skin to become thick or discolored. What are some common skin conditions that affect people with diabetes? Diabetes often causes dry skin. It can also cause the skin on the feet to get thinner, break more easily, and heal more slowly. There are certain skin conditions that commonly affect people who have diabetes, such as:  Bacterial skin infections, such as styes, boils, infected hair follicles, and infections of the skin around the nails.  Fungal skin infections. These are most common in areas where skin rubs together, such as in the armpits or under the breasts.  Open sores, especially on the feet.  Tissue death (gangrene). This can happen on your feet if a serious infection does not heal properly. Gangrene can cause the need for a foot or leg to be surgically removed (amputated). Diabetes can also cause the skin to change. You may develop:  Dark, velvety markings on the skin that usually appear on the face, neck, armpits, inner thighs, and groin (acanthosis nigricans). This typically affects people of African-American and American-Indian descent.  Red, raised, scar-like tissue that may itch, feel painful, or develop into a wound (necrobiosis lipoidica).  Blisters on feet, toes, hands, or fingers.  Thickened, wax-like areas of skin that usually occur on the hands, forehead, or toes (digital sclerosis).  Brown or  red ring-shaped or half-ring-shaped patches of skin on the ears or fingers (disseminated granuloma).  Pea-shaped yellow bumps that may be itchy and surrounded by a red ring (eruptive xanthomatosis). This usually affects the arms, feet, buttocks, and the top of the hands.  Round, discolored patches of tan skin that do not hurt or itch (diabetic dermopathy). These may look like age spots. What do I need to know about itchy skin? It is common for people with diabetes to have itchy skin caused by dryness. Frequent high blood glucose levels can cause itchiness, and poor circulation and certain skin infections can make dry, itchy skin worse. If you have itchy skin that is red or covered in a rash, this could be a sign of an allergic reaction to a medicine. If you have a rash or if your skin is very itchy, contact your health care provider. You may need help to manage your diabetes better, or you may need treatment for an infection. How can I prevent skin breakdown? When you have diabetes and you get a badly infected ulcer or sore that does not heal, your skin can break down, especially if you have poor circulation or are on bed rest. To prevent skin breakdown:  Keep your skin clean and dry. Wash your skin often. Do not use hot water.  Do not use any products that contain nicotine or tobacco, such as cigarettes and e-cigarettes. Smoking affects the body's ability to heal. If you need help quitting, ask your health care provider.  Check your skin every day for cuts, bruises, redness, blisters, or sores, especially on your feet. Tell your health care provider   about any cuts, wounds, or sores you have, especially if they are healing slowly.  If you are on bed rest, try to change positions often. What else do I need to know about taking care of my skin?   To relieve dry skin and itching: ? Limit baths and showers to 5-10 minutes. ? Bathe with lukewarm water instead of hot water. ? Use mild soap and  gentle skin cleansers. Do not use soap that is perfumed or harsh or dries your skin. ? Put on lotion as soon as you finish bathing.  Make sure that your health care provider performs a visual foot exam at every medical visit.  Schedule a foot exam with your health care provider once every year. This exam includes an inspection of the structure and skin of your feet.  If you get a skin injury, such as a cut, blister, or sore, check the area every day for signs of infection. Check for: ? More redness, swelling, or pain. ? More fluid or blood. ? Warmth. ? Pus or a bad smell. Contact a health care provider if:  You develop a cut or sore, especially on your feet.  You develop signs of infection after a skin injury.  Your blood glucose level is higher than 240 mg/dL (13.3 mmol/L) for 2 days in a row.  You have itchy skin that develops redness or a rash.  You have discolored areas of skin.  You have areas where your skin is changing, such as thickening or appearing shiny. Summary  Diabetes (diabetes mellitus) can lead to health problems over time, including skin problems.  People with diabetes have a higher risk for many types of skin complications.  Check your skin every day for cuts, bruises, redness, blisters, or sores, especially on your feet.  Tell your health care provider about any cuts, wounds, or sores you have, especially if they are healing slowly. This information is not intended to replace advice given to you by your health care provider. Make sure you discuss any questions you have with your health care provider. Document Revised: 07/13/2016 Document Reviewed: 06/01/2015 Elsevier Patient Education  2020 [employer]  

## 2019-11-26 NOTE — Progress Notes (Signed)
Established Patient Office Visit  Subjective:  Patient ID: [name], female    DOB: [birth year]  Age: 49 y.o. MRN: [id number]  CC:  Chief Complaint  Patient presents with  . Follow-up    HPI [name] presents for follow up. She  has a past medical history of Anxiety, Depression, Diabetes mellitus without complication (HCC), High cholesterol, Hypertension, Neuropathy, PCOS (polycystic ovarian syndrome), and Type 1 diabetes (HCC).   Patient presents for follow up of diabetes. Current symptoms include: hyperglycemia, nausea, paresthesia of the feet, visual disturbances and vomitting. Symptoms have stabilized. Patient denies hypoglycemia . Evaluation to date has included: fasting blood sugar, fasting lipid panel, hemoglobin A1C and microalbuminuria.  Home sugars: patient does not check sugars. Current treatment: Discontinued insulin which has been discontinued because of side effects, Continued metformin which has been not very  effective and Continued statin which has not been  Very effective. Last dilated eye exam: Dec 2021 apt pending. She admits, " One day she may admit that she has diabetes and take the insulin."  She has fully recovered from her URI.  Past Medical History:  Diagnosis Date  . Anxiety   . Depression   . Diabetes mellitus without complication (HCC)   . High cholesterol   . Hypertension   . Neuropathy   . PCOS (polycystic ovarian syndrome)   . Type 1 diabetes [facility])     Past Surgical History:  Procedure Laterality Date  . AMPUTATION Right 06/12/2019   Procedure: AMPUTATION RAY RIGHT GREAT TOE;  Surgeon: Toni Arthurs, MD;  Location: [facility] OR;  Service: Orthopedics;  Laterality: Right;  . ELBOW SURGERY    . INSERTION OF AHMED VALVE Right 09/07/2015   Procedure: INSERTION OF AHMED VALVE;  Surgeon: Edmon Crape, MD;  Location: [facility] OR;  Service: Ophthalmology;  Laterality: Right;  . LASER PHOTO ABLATION Right 09/07/2015   Procedure: LASER PHOTO ABLATION;   Surgeon: Edmon Crape, MD;  Location: [facility] OR;  Service: Ophthalmology;  Laterality: Right;  . PARS PLANA VITRECTOMY Right 09/07/2015   Procedure: PARS PLANA VITRECTOMY WITH 25 GAUGE,, with panretinal endolaser,;  Surgeon: Edmon Crape, MD;  Location: [facility] OR;  Service: Ophthalmology;  Laterality: Right;    Family History  Problem Relation Age of Onset  . Depression Mother   . Anxiety disorder Mother   . Diabetes Mother   . Diabetes Father   . Diabetes Brother     Social History   Socioeconomic History  . Marital status: Married    Spouse name: Not on file  . Number of children: Not on file  . Years of education: Not on file  . Highest education level: Not on file  Occupational History  . Not on file  Tobacco Use  . Smoking status: Former Smoker    Types: Cigarettes    Quit date: 09/12/2000    Years since quitting: 19.2  . Smokeless tobacco: Never Used  Vaping Use  . Vaping Use: Never used  Substance and Sexual Activity  . Alcohol use: Not Currently    Comment: occasional  . Drug use: Yes    Types: Marijuana    Comment: daily   . Sexual activity: Yes    Birth control/protection: None, Condom  Other Topics Concern  . Not on file  Social History Narrative  . Not on file   Social Determinants of Health   Financial Resource Strain:   . Difficulty of Paying Living Expenses: Not on file  Food  Insecurity:   . Worried About Programme researcher, broadcasting/film/videounning Out of Food in the Last Year: Not on file  . Ran Out of Food in the Last Year: Not on file  Transportation Needs:   . Lack of Transportation (Medical): Not on file  . Lack of Transportation (Non-Medical): Not on file  Physical Activity:   . Days of Exercise per Week: Not on file  . Minutes of Exercise per Session: Not on file  Stress:   . Feeling of Stress : Not on file  Social Connections:   . Frequency of Communication with Friends and Family: Not on file  . Frequency of Social Gatherings with Friends and Family: Not on file  . Attends  Religious Services: Not on file  . Active Member of Clubs or Organizations: Not on file  . Attends BankerClub or Organization Meetings: Not on file  . Marital Status: Not on file  Intimate Partner Violence:   . Fear of Current or Ex-Partner: Not on file  . Emotionally Abused: Not on file  . Physically Abused: Not on file  . Sexually Abused: Not on file    Outpatient Medications Prior to Visit  Medication Sig Dispense Refill  . acetaminophen (TYLENOL) 325 MG tablet Take 2 tablets (650 mg total) by mouth every 6 (six) hours as needed for mild pain (or Fever >/= 101).    Marland Kitchen. albuterol (VENTOLIN HFA) 108 (90 Base) MCG/ACT inhaler Inhale 2 puffs into the lungs every 6 (six) hours as needed for wheezing or shortness of breath. 8 g 11  . ALPRAZolam (XANAX) 0.5 MG tablet Take 1 tablet (0.5 mg total) by mouth 2 (two) times daily as needed for anxiety. 60 tablet 0  . amphetamine-dextroamphetamine (ADDERALL) 15 MG tablet Take 1 tablet by mouth 2 (two) times daily. 60 tablet 0  . ARIPiprazole (ABILIFY) 5 MG tablet Take 1 tablet (5 mg total) by mouth daily. 30 tablet 2  . Artificial Tear Ointment (DRY EYES OP) Place 1-2 drops into both eyes daily as needed (dry eyes).     Marland Kitchen. aspirin EC 81 MG tablet Take 1 tablet (81 mg total) by mouth 2 (two) times daily. 60 tablet 2  . atorvastatin (LIPITOR) 40 MG tablet Take 1 tablet (40 mg total) by mouth every evening. (Patient taking differently: Take 40 mg by mouth every morning. ) 90 tablet 2  . famotidine (PEPCID) 20 MG tablet Take 1 tablet by mouth twice daily 30 tablet 3  . FLUoxetine (PROZAC) 40 MG capsule Take 2 capsules (80 mg total) by mouth every morning. 60 capsule 2  . glucose blood (ONE TOUCH ULTRA TEST) test strip 1 each by Other route 2 (two) times daily. And lancets 2/day. 100 each 0  . hydrOXYzine (ATARAX/VISTARIL) 25 MG tablet Take 1 tablet (25 mg total) by mouth 3 (three) times daily as needed. 90 tablet 2  . insulin aspart protamine- aspart (NOVOLOG MIX  70/30) (70-30) 100 UNIT/ML injection Inject 0.07 mLs (7 Units total) into the skin 2 (two) times daily with a meal. 30 mL 5  . Insulin Syringe-Needle U-100 (INSULIN SYRINGE .5CC/30GX1/2") 30G X 1/2" 0.5 ML MISC 1 each by Does not apply route every 12 (twelve) hours. 100 each 1  . metFORMIN (GLUCOPHAGE) 1000 MG tablet TAKE 1 TABLET BY MOUTH TWICE DAILY WITH  A  MEAL. 180 tablet 0  . Multiple Vitamins-Minerals (MULTIVITAMIN GUMMIES ADULT PO) Take 1 capsule by mouth daily.    . naproxen sodium (ALEVE) 220 MG tablet Take 220 mg by  mouth daily as needed (headache).    . pantoprazole (PROTONIX) 40 MG tablet Take 1 tablet (40 mg total) by mouth daily. 90 tablet 3  . traZODone (DESYREL) 50 MG tablet Take 1 tablet (50 mg total) by mouth at bedtime. 30 tablet 2  . gabapentin (NEURONTIN) 800 MG tablet TAKE 2 TABLETS BY MOUTH IN THE MORNING AND 3 TABLETS  IN THE EVENING 150 tablet 0  . spironolactone (ALDACTONE) 100 MG tablet Take 1 tablet by mouth once daily 90 tablet 0  . azithromycin (ZITHROMAX) 250 MG tablet 2 tabs on day 1 and daily x 4 days (Patient not taking: Reported on 11/26/2019) 6 tablet 0   No facility-administered medications prior to visit.    No Active Allergies  ROS Review of Systems  Constitutional: Negative.   HENT: Negative.   Eyes:       Dec 14 to follow up with retina specialist   Respiratory: Negative.   Cardiovascular: Negative.   Gastrointestinal: Positive for constipation, diarrhea, nausea (from the gastroparesis) and vomiting (am maybe once or twice).  Genitourinary: Negative.   Allergic/Immunologic: Negative.       Objective:    Physical Exam Constitutional:      General: She is not in acute distress.    Appearance: She is not ill-appearing, toxic-appearing or diaphoretic.  HENT:     Head: Normocephalic and atraumatic.  Eyes:     Comments: Visual disturbance   Cardiovascular:     Rate and Rhythm: Normal rate and regular rhythm.     Pulses: Normal pulses.      Heart sounds: Normal heart sounds.  Pulmonary:     Effort: Pulmonary effort is normal.     Breath sounds: Normal breath sounds.  Abdominal:     General: Bowel sounds are normal.     Palpations: Abdomen is soft.  Musculoskeletal:        General: Normal range of motion.     Cervical back: Normal range of motion.  Skin:    General: Skin is warm and dry.     Capillary Refill: Capillary refill takes less than 2 seconds.  Neurological:     General: No focal deficit present.     Mental Status: She is alert and oriented to person, place, and time.  Psychiatric:        Mood and Affect: Mood normal.        Behavior: Behavior normal.        Thought Content: Thought content normal.        Judgment: Judgment normal.     BP 134/73 (BP Location: Right Arm, Patient Position: Sitting, Cuff Size: Normal)   Pulse 74   Temp (!) 97.2 F (36.2 C) (Temporal)   Resp 18   Ht 5\' 4"  (1.626 m)   Wt 169 lb 12.8 oz (77 kg)   SpO2 100%   BMI 29.15 kg/m  Wt Readings from Last 3 Encounters:  11/26/19 169 lb 12.8 oz (77 kg)  08/25/19 158 lb (71.7 kg)  06/26/19 152 lb (68.9 kg)     There are no preventive care reminders to display for this patient.  There are no preventive care reminders to display for this patient.  Lab Results  Component Value Date   TSH 2.580 06/03/2018   Lab Results  Component Value Date   WBC 11.7 (H) 06/26/2019   HGB 13.6 06/26/2019   HCT 40.0 06/26/2019   MCV 85.2 06/26/2019   PLT 424 (H) 06/26/2019   Lab Results  Component Value Date   NA 138 06/26/2019   K 4.5 06/26/2019   CO2 21 (L) 06/26/2019   GLUCOSE 244 (H) 06/26/2019   BUN 18 06/26/2019   CREATININE 0.88 06/26/2019   BILITOT 1.1 06/26/2019   ALKPHOS 57 06/26/2019   AST 11 (L) 06/26/2019   ALT 16 06/26/2019   PROT 6.4 (L) 06/26/2019   ALBUMIN 2.8 (L) 06/26/2019   CALCIUM 8.5 (L) 06/26/2019   ANIONGAP 12 06/26/2019   Lab Results  Component Value Date   CHOL 297 (H) 08/25/2019   Lab Results   Component Value Date   HDL 58 08/25/2019   Lab Results  Component Value Date   LDLCALC 173 (H) 08/25/2019   Lab Results  Component Value Date   TRIG 344 (H) 08/25/2019   Lab Results  Component Value Date   CHOLHDL 5.1 (H) 08/25/2019   Lab Results  Component Value Date   HGBA1C 12.3 (A) 11/26/2019      Assessment & Plan:   Problem List Items Addressed This Visit      Cardiovascular and Mediastinum   Hypertensive disorder Encouraged on going compliance with current medication regimen Encouraged home monitoring and recording BP <130/80 Eating a heart-healthy diet with less salt Encouraged regular physical activity     Relevant Medications   spironolactone (ALDACTONE) 100 MG tablet     Other   Blindness    Other Visit Diagnoses    Type 2 diabetes mellitus not at goal [facility])    -  Primary Encourage compliance with current treatment regimen including insulin.  Encourage regular CBG monitoring Encourage contacting office if excessive hyperglycemia and or hypoglycemia Lifestyle modification with healthy diet (fewer calories, more high fiber foods, whole grains and non-starchy vegetables, lower fat meat and fish, low-fat diary include healthy oils) regular exercise (physical activity) Opthalmology exam discussed     Relevant Orders   POCT URINALYSIS DIP (CLINITEK) (Completed)   HgB A1c (Completed)   Glucose (CBG) (Completed)   Flu vaccine need       Relevant Orders   Flu Vaccine QUAD 6+ mos PF IM (Fluarix Quad PF) (Completed)   Other diabetic neurological complication associated with type 2 diabetes mellitus (HCC)       Relevant Medications   gabapentin (NEURONTIN) 800 MG tablet   Noncompliance with diabetes treatment          Meds ordered this encounter  Medications  . gabapentin (NEURONTIN) 800 MG tablet    Sig: TAKE 2 TABLETS BY MOUTH IN THE MORNING AND 3 TABLETS  IN THE EVENING    Dispense:  150 tablet    Refill:  11    Order Specific Question:    Supervising Provider    Answer:   Quentin Angst [[id number]]  . spironolactone (ALDACTONE) 100 MG tablet    Sig: Take 1 tablet (100 mg total) by mouth daily.    Dispense:  30 tablet    Refill:  11    Order Specific Question:   Supervising Provider    Answer:   Quentin Angst [[id number]]    Follow-up: Return in about 3 months (around 02/26/2020).    Barbette Merino, NP

## 2019-12-01 ENCOUNTER — Ambulatory Visit (HOSPITAL_COMMUNITY): Payer: Self-pay | Admitting: Licensed Clinical Social Worker

## 2019-12-03 ENCOUNTER — Ambulatory Visit (INDEPENDENT_AMBULATORY_CARE_PROVIDER_SITE_OTHER): Payer: Medicare Other | Admitting: Licensed Clinical Social Worker

## 2019-12-03 ENCOUNTER — Other Ambulatory Visit: Payer: Self-pay

## 2019-12-03 DIAGNOSIS — F418 Other specified anxiety disorders: Secondary | ICD-10-CM

## 2019-12-06 NOTE — Progress Notes (Signed)
   THERAPIST PROGRESS NOTE   Virtual Visit via Video Note  I connected with [name] on 12/03/19 at 11:00 AM EST by a video enabled telemedicine application and verified that I am speaking with the correct person using two identifiers.  Location: Patient: Home Provider: [facility]   I discussed the limitations of evaluation and management by telemedicine and the availability of in person appointments. The patient expressed understanding and agreed to proceed. I discussed the assessment and treatment plan with the patient. The patient was provided an opportunity to ask questions and all were answered. The patient agreed with the plan and demonstrated an understanding of the instructions.  I provided 55 minutes of non-face-to-face time during this encounter.  Participation Level: Active  Behavioral Response: CasualAlertEuthymic  Type of Therapy: Individual Therapy  Treatment Goals addressed: Communication: Dep/Anx/Coping  Interventions: Motivational Interviewing and Supportive  Summary: [name] is a 49 y.o. female who presents with hx of anx/dep. This date pt signs on for video session per her preference. Pt last seen 08/28/19. Pt got into scheduling gap and reports she had to cancel last appt d/t bronchitis. She continues to have a lingering cough. Pt continues to endorse dep/anx and poor physical health. She provides details. She advises her sight is further diminished yet she does intend to see Retina specialist this month. Report she also has a dental appt since her Medicare has started. Pt advises she and [name] are going to be moving to [place]. She states they have the house mostly packed up. [name] has been interviewing for jobs. They are getting a home equity loan to complete repairs [name] did not follow through with and then place home on the market. Pt states [place] will be driving distance to her father and her "foster sister". She  states she will also not be far from [place] so she can spend time with Tammy Sours she still has romantic feelings for. She advises she and [name] have talked about their interdependence on one another. They plan to stay together but plan to "just be friends" and see other people as desired. Pt hopes this move with give her a "fresh start". She is mindful about getting all healthcare set up before leaving to avoid gaps. She is taking MH meds as prescribed with exception of stopping Seroquel d/t side effects. Next appt with LCSW in 2 wks. Reviewed poc and coping prior to close of session. Pt will try out Daily Calm in addition to existing strategies. Pt states appreciation for care.    Suicidal/Homicidal: Intermittentwithout intent/plan  Therapist Response: Pt remains receptive to care.  Plan: Return again in 2 weeks.  Diagnosis: Axis I: Anxiety with depression    Axis II: Deferred  [place] Sink, LCSW 12/06/2019

## 2019-12-08 ENCOUNTER — Ambulatory Visit (INDEPENDENT_AMBULATORY_CARE_PROVIDER_SITE_OTHER): Payer: Medicare Other

## 2019-12-08 ENCOUNTER — Encounter: Payer: Self-pay | Admitting: Podiatry

## 2019-12-08 ENCOUNTER — Other Ambulatory Visit: Payer: Self-pay

## 2019-12-08 ENCOUNTER — Ambulatory Visit (INDEPENDENT_AMBULATORY_CARE_PROVIDER_SITE_OTHER): Payer: Medicare Other | Admitting: Podiatry

## 2019-12-08 ENCOUNTER — Other Ambulatory Visit: Payer: Self-pay | Admitting: Podiatry

## 2019-12-08 DIAGNOSIS — S90212A Contusion of left great toe with damage to nail, initial encounter: Secondary | ICD-10-CM

## 2019-12-08 DIAGNOSIS — S99922A Unspecified injury of left foot, initial encounter: Secondary | ICD-10-CM

## 2019-12-08 NOTE — Progress Notes (Signed)
  Subjective:  Patient ID: [name], female    DOB: [birth year],  MRN: [id number]  Chief Complaint  Patient presents with  . Toe Pain    Patient kicked box 2-3 weeks ago and now c/o left hallux pain, swelling and nail peeling and cracking.  She says its sore and painful to walk    49 y.o. female presents with the above complaint. History confirmed with patient. Has a history of R hallux open fracture with amputation. She is blind due to diabetic retinopathy and often stubs her toe.  Objective:  Physical Exam: warm, good capillary refill, no trophic changes or ulcerative lesions and normal DP and PT pulses. Absent protective sensation. Well healed R hallux amputation. Mild pain, no instability or deformity of L hallux. Mild subungual hematoma  Radiographs: X-ray of the left foot: no fracture, dislocation, swelling or degenerative changes noted Assessment:   1. Injury of great toe of left foot, initial encounter   2. Contusion of left great toe with damage to nail, initial encounter      Plan:  Patient was evaluated and treated and all questions answered.  -Discussed she has a deep contusion. Expect this to resolve on its own. Discussed that we could put her in a surgical shoe but she would be OK to continue regular shoe gear, she would prefer this. RICE protocol reviewed  Return if symptoms worsen or fail to improve.

## 2019-12-09 ENCOUNTER — Telehealth: Payer: Self-pay | Admitting: Podiatry

## 2019-12-09 NOTE — Telephone Encounter (Signed)
Pt called and left message asking about diabetic shoes, she had gotten them a few yrs ago from [place].She also asked if Dr Rebekah Chesterfield was still here.    I returned call and told her yes Dr Amalia Hailey is still here but with her issue they worked her in with Dr Sherryle Lis to get her in asap. I Also explained that medicare does cover shoes and inserts at 80% after the deductible is met. She is changing to a [employer] plan next year and I told her to make sure Dr Rebekah Chesterfield is in network with her new insurance. I have scheduled her for a diabetic foot exam on 1.19.22 with Dr Amalia Hailey per her request. She also stated that she has lost her great toe and her inserts fill weird. I told her if she qualifies next yr she could possibly get her a toe filler insert.

## 2019-12-17 ENCOUNTER — Other Ambulatory Visit: Payer: Self-pay

## 2019-12-17 ENCOUNTER — Ambulatory Visit (INDEPENDENT_AMBULATORY_CARE_PROVIDER_SITE_OTHER): Payer: Medicare Other | Admitting: Licensed Clinical Social Worker

## 2019-12-17 DIAGNOSIS — F418 Other specified anxiety disorders: Secondary | ICD-10-CM | POA: Diagnosis not present

## 2019-12-18 NOTE — Progress Notes (Signed)
   THERAPIST PROGRESS NOTE   Virtual Visit via Video Note  I connected with [name] on 12/17/19 at 11:00 AM EST by a video enabled telemedicine application and verified that I am speaking with the correct person using two identifiers.  Location: Patient: Home Provider: [facility]   I discussed the limitations of evaluation and management by telemedicine and the availability of in person appointments. The patient expressed understanding and agreed to proceed. I discussed the assessment and treatment plan with the patient. The patient was provided an opportunity to ask questions and all were answered. The patient agreed with the plan and demonstrated an understanding of the instructions.   I provided 45 minutes of non-face-to-face time during this encounter.  Participation Level: Active  Behavioral Response: CasualAlertEuthymic  Type of Therapy: Individual Therapy  Treatment Goals addressed: Communication: Anx/Dep/Coping  Interventions: Motivational Interviewing and Supportive  Summary: [name] is a 49 y.o. female who presents with hx of dep/anx. This date pt signs on for video session per her preference. Pt smiling and has one of her cats in her lap. Pt reports she has been in "a good mood lately". She advises her plans are moving forward for the move to [place]. Pt states she found a home with acreage they hope to buy. She advises their local home is now almost completely packed up into the outside storage containers. She states home equity loan is pending to get home ready for market. [name] also reports she had [name]'s grandparents and his mother over to tell them they were moving and it went quite well. [name]'s mother has volunteered herself to come over to help paint the interior of their home and will be staying with them several days. Pt states she is grateful for the help. [name] does not yet have a job in [place]. Most jobs he is applying for want him to come for an in person  interview. Pt states [name] is taking the week of Jan 1 off and they are going to [place] together to accomplish a variety of things r/t move. Pt will need to reschedule next session on 01/06/20. She states goal is to be in [place] by end of Jan unless she has to have back surgery. Pt gives some updates on her health. She states "I love Medicare". Pt has been to dentist, neurologist (MRI pending to eval lower back pain) and eye Dr. [name] Nephew states when she saw eye Dr he assured her he could get some sight back in her L eye. She provides details. Pt hopes to undergo tx within the next two wks. She reflects on how much this is going to change her life. LCSW joined in her celebration of this good news and it's impacts. Pt reports she is doing "very well" with Abilify and Trazodone including more restful sleep. Pt reports with her ins she is now able to get all her meds filled at once and is better managed with all chronic health conditions. Coping addressed including holidays. Pt reports mild urge to cut one time but able to distract self. She will have many more options r/t coping as soon as she gets some sight back. LCSW reviewed poc with pt verbal acceptance. Pt states appreciation for care.     Suicidal/Homicidal: Nowithout intent/plan  Therapist Response: Pt remains receptive to care.  Plan: Return again in ~ 4 weeks.  Diagnosis: Axis I: Depression with anxiety    Axis II: Deferred  [place] Sink, LCSW 12/18/2019

## 2020-01-06 ENCOUNTER — Ambulatory Visit (HOSPITAL_COMMUNITY): Payer: No Payment, Other | Admitting: Licensed Clinical Social Worker

## 2020-01-08 ENCOUNTER — Other Ambulatory Visit (HOSPITAL_COMMUNITY): Payer: Self-pay | Admitting: Psychiatry

## 2020-01-08 ENCOUNTER — Telehealth (HOSPITAL_COMMUNITY): Payer: Self-pay | Admitting: *Deleted

## 2020-01-08 ENCOUNTER — Other Ambulatory Visit: Payer: Self-pay | Admitting: Nurse Practitioner

## 2020-01-08 ENCOUNTER — Telehealth: Payer: Self-pay | Admitting: Nurse Practitioner

## 2020-01-08 DIAGNOSIS — K219 Gastro-esophageal reflux disease without esophagitis: Secondary | ICD-10-CM

## 2020-01-08 DIAGNOSIS — F411 Generalized anxiety disorder: Secondary | ICD-10-CM

## 2020-01-08 DIAGNOSIS — F900 Attention-deficit hyperactivity disorder, predominantly inattentive type: Secondary | ICD-10-CM

## 2020-01-08 MED ORDER — AMPHETAMINE-DEXTROAMPHETAMINE 15 MG PO TABS: 15.0000 mg | ORAL_TABLET | Freq: Two times a day (BID) | ORAL | 0 refills | Status: DC

## 2020-01-08 NOTE — Telephone Encounter (Signed)
VM from patient requesting her Xanax and Adderall be called in. Reviewed record and she has a current rx dated 01/08/20 for her Xanax but will need her Adderall called in . Will bring need to [facility] attention.

## 2020-01-08 NOTE — Telephone Encounter (Signed)
Adderall refilled and sent to preferred pharmacy.

## 2020-01-09 ENCOUNTER — Other Ambulatory Visit: Payer: Self-pay

## 2020-01-09 ENCOUNTER — Other Ambulatory Visit: Payer: Medicare Other

## 2020-01-09 DIAGNOSIS — Z20822 Contact with and (suspected) exposure to covid-19: Secondary | ICD-10-CM

## 2020-01-09 NOTE — Telephone Encounter (Signed)
Sent to provider 

## 2020-01-10 ENCOUNTER — Other Ambulatory Visit: Payer: Self-pay | Admitting: Nurse Practitioner

## 2020-01-10 DIAGNOSIS — K219 Gastro-esophageal reflux disease without esophagitis: Secondary | ICD-10-CM

## 2020-01-10 MED ORDER — FAMOTIDINE 20 MG PO TABS: 20.0000 mg | ORAL_TABLET | Freq: Two times a day (BID) | ORAL | 11 refills | Status: DC

## 2020-01-10 NOTE — Telephone Encounter (Signed)
Refill was sent on 01/08/2020 and 01/10/2020

## 2020-01-10 NOTE — Progress Notes (Signed)
   [facility] Patient [facility] [address] [place], [place]  [address] Phone:  [phone]   Fax:  [phone]  Refill sent to [facility] on 01/08/2020. New prescription resent to [facility] today

## 2020-01-13 LAB — NOVEL CORONAVIRUS, NAA: SARS-CoV-2, NAA: NOT DETECTED

## 2020-01-15 ENCOUNTER — Other Ambulatory Visit: Payer: Self-pay | Admitting: Nurse Practitioner

## 2020-01-15 ENCOUNTER — Ambulatory Visit: Payer: Medicare Other | Admitting: Endocrinology

## 2020-01-15 DIAGNOSIS — E1149 Type 2 diabetes mellitus with other diabetic neurological complication: Secondary | ICD-10-CM

## 2020-01-15 DIAGNOSIS — E119 Type 2 diabetes mellitus without complications: Secondary | ICD-10-CM

## 2020-01-16 ENCOUNTER — Other Ambulatory Visit: Payer: Self-pay | Admitting: Neurosurgery

## 2020-01-20 ENCOUNTER — Ambulatory Visit (HOSPITAL_COMMUNITY): Payer: Medicare Other | Admitting: Licensed Clinical Social Worker

## 2020-01-21 ENCOUNTER — Ambulatory Visit: Payer: Medicare Other | Admitting: Podiatry

## 2020-01-23 ENCOUNTER — Ambulatory Visit (HOSPITAL_COMMUNITY): Payer: Medicare Other | Admitting: Licensed Clinical Social Worker

## 2020-01-23 ENCOUNTER — Other Ambulatory Visit: Payer: Self-pay

## 2020-01-23 ENCOUNTER — Telehealth (HOSPITAL_COMMUNITY): Payer: Self-pay | Admitting: Licensed Clinical Social Worker

## 2020-01-26 ENCOUNTER — Telehealth (HOSPITAL_COMMUNITY): Payer: Self-pay | Admitting: Psychiatry

## 2020-01-29 NOTE — Telephone Encounter (Signed)
Provider attempted to call patient 3 times without success. Patient is welcome to walk in during walk in hours if assistance is needed. Xanax will not be increased at this time.

## 2020-02-02 ENCOUNTER — Ambulatory Visit (INDEPENDENT_AMBULATORY_CARE_PROVIDER_SITE_OTHER): Payer: Medicare Other | Admitting: Podiatry

## 2020-02-02 ENCOUNTER — Other Ambulatory Visit: Payer: Self-pay

## 2020-02-02 DIAGNOSIS — L97522 Non-pressure chronic ulcer of other part of left foot with fat layer exposed: Secondary | ICD-10-CM

## 2020-02-02 DIAGNOSIS — E0843 Diabetes mellitus due to underlying condition with diabetic autonomic (poly)neuropathy: Secondary | ICD-10-CM | POA: Diagnosis not present

## 2020-02-02 MED ORDER — GENTAMICIN SULFATE 0.1 % EX CREA: 1.0000 "application " | TOPICAL_CREAM | Freq: Two times a day (BID) | CUTANEOUS | 1 refills | Status: AC

## 2020-02-02 NOTE — Progress Notes (Signed)
   Subjective:  50 y.o. female with PMHx of diabetes mellitus presenting today for evaluation of an ulcer that developed to the left fifth toe.  Since the patient was last seen by me 08/20/2017 she has undergone right hallux amputation.  This was performed June 2021 by Dr. Victorino Dike MD. patient states that it healed uneventfully. She presents today for diabetic foot exam and to set up an appointment for new diabetic shoes   Past Medical History:  Diagnosis Date  . Anxiety   . Depression   . Diabetes mellitus without complication (HCC)   . High cholesterol   . Hypertension   . Neuropathy   . PCOS (polycystic ovarian syndrome)   . Type 1 diabetes (HCC)       Objective/Physical Exam General: The patient is alert and oriented x3 in no acute distress.  Dermatology:  Wound #1 noted to the left fifth toe measuring approximately 0.6 x 0.6 x 0.2 cm (LxWxD).   To the noted ulceration(s), there is no eschar. There is a moderate amount of slough, fibrin, and necrotic tissue noted. Granulation tissue and wound base is red. There is a minimal amount of serosanguineous drainage noted. There is no exposed bone muscle-tendon ligament or joint. There is no malodor. Periwound integrity is intact. Skin is warm, dry and supple bilateral lower extremities.  Vascular: Palpable pedal pulses bilaterally. No edema or erythema noted. Capillary refill within normal limits.  Neurological: Epicritic and protective threshold diminished bilaterally.   Musculoskeletal Exam: History of right hallux amputation.  June 2021  Assessment: 1.  Ulcer left fifth toe secondary to diabetes mellitus 2. diabetes mellitus w/ peripheral neuropathy   Plan of Care:  1. Patient was evaluated. 2. medically necessary excisional debridement including subcutaneous tissue was performed using a tissue nipper and a chisel blade. Excisional debridement of all the necrotic nonviable tissue down to healthy bleeding viable tissue was  performed with post-debridement measurements same as pre-. 3. the wound was cleansed and dry sterile dressing applied. 4.  Prescription for gentamicin cream applied daily  5.  Appointment with Pedorthist for new diabetic shoes and insoles  6.  Patient is to return to clinic in 3 weeks.   Felecia Shelling, DPM [facility]  Dr. Felecia Shelling, DPM    [address]                                        [place], [place] [address]                Office [phone]  Fax [phone]

## 2020-02-03 ENCOUNTER — Other Ambulatory Visit: Payer: Self-pay

## 2020-02-05 ENCOUNTER — Ambulatory Visit (INDEPENDENT_AMBULATORY_CARE_PROVIDER_SITE_OTHER): Payer: Medicare Other | Admitting: Endocrinology

## 2020-02-05 ENCOUNTER — Other Ambulatory Visit (HOSPITAL_COMMUNITY): Payer: Self-pay | Admitting: Psychiatry

## 2020-02-05 ENCOUNTER — Encounter: Payer: Self-pay | Admitting: Endocrinology

## 2020-02-05 ENCOUNTER — Other Ambulatory Visit: Payer: Self-pay

## 2020-02-05 VITALS — BP 138/60 | HR 80 | Wt 172.0 lb

## 2020-02-05 DIAGNOSIS — E119 Type 2 diabetes mellitus without complications: Secondary | ICD-10-CM

## 2020-02-05 DIAGNOSIS — E1165 Type 2 diabetes mellitus with hyperglycemia: Secondary | ICD-10-CM

## 2020-02-05 DIAGNOSIS — F411 Generalized anxiety disorder: Secondary | ICD-10-CM

## 2020-02-05 LAB — POCT GLYCOSYLATED HEMOGLOBIN (HGB A1C): Hemoglobin A1C: 12.5 % — AB (ref 4.0–5.6)

## 2020-02-05 NOTE — Patient Instructions (Addendum)
check your blood sugar twice a day: before the 3 meals, and at bedtime.  also check if you have symptoms of your blood sugar being too high or too low.  please keep a record of the readings and bring it to your next appointment here (or you can bring the meter itself).  You can write it on any piece of paper.  please call us sooner if your blood sugar goes below 70, or if you have a lot of readings over 200. Please continue the same metformin.  Drinking fluids also helps the blood sugar.   Blood tests are requested for you today.  We'll let you know about the results.  Please come back for a follow-up appointment in 1 month.

## 2020-02-05 NOTE — Progress Notes (Signed)
Subjective:    Patient ID: [name], female    DOB: 1970-03-20, 50 y.o.   MRN: [id number]  HPI Pt returns for f/u of diabetes mellitus: DM type: 1 (but she is only intermittently ketosis-prone) Dx'ed: 1997 Complications: PPN, R great toe amputation, GP, DR, CAD, and foot ulcer.   Therapy: metformin GDM: never (G0) DKA: 2013 and 2019 Severe hypoglycemia: never. Pancreatitis: never Other: she often goes off the insulin for months at a time, due to bipolar disorder, and to control her weight; she did not tolerate Jardiance or Trulicity.   Interval history: she last took insulin a few mos ago, to help control her weight.  pt states dry mouth and fatigue.   Past Medical History:  Diagnosis Date  . Anxiety   . Depression   . Diabetes mellitus without complication (HCC)   . High cholesterol   . Hypertension   . Neuropathy   . PCOS (polycystic ovarian syndrome)   . Type 1 diabetes [facility]     Past Surgical History:  Procedure Laterality Date  . AMPUTATION Right 06/12/2019   Procedure: AMPUTATION RAY RIGHT GREAT TOE;  Surgeon: Toni Arthurs, MD;  Location: [facility] OR;  Service: Orthopedics;  Laterality: Right;  . ELBOW SURGERY    . INSERTION OF AHMED VALVE Right 09/07/2015   Procedure: INSERTION OF AHMED VALVE;  Surgeon: Edmon Crape, MD;  Location: [facility] OR;  Service: Ophthalmology;  Laterality: Right;  . LASER PHOTO ABLATION Right 09/07/2015   Procedure: LASER PHOTO ABLATION;  Surgeon: Edmon Crape, MD;  Location: [facility] OR;  Service: Ophthalmology;  Laterality: Right;  . PARS PLANA VITRECTOMY Right 09/07/2015   Procedure: PARS PLANA VITRECTOMY WITH 25 GAUGE,, with panretinal endolaser,;  Surgeon: Edmon Crape, MD;  Location: [facility] OR;  Service: Ophthalmology;  Laterality: Right;    Social History   Socioeconomic History  . Marital status: Married    Spouse name: Not on file  . Number of children: Not on file  . Years of education: Not on file  . Highest education level: Not on file   Occupational History  . Not on file  Tobacco Use  . Smoking status: Former Smoker    Types: Cigarettes    Quit date: 09/12/2000    Years since quitting: 19.4  . Smokeless tobacco: Never Used  Vaping Use  . Vaping Use: Never used  Substance and Sexual Activity  . Alcohol use: Not Currently    Comment: occasional  . Drug use: Yes    Types: Marijuana    Comment: daily   . Sexual activity: Yes    Birth control/protection: None, Condom  Other Topics Concern  . Not on file  Social History Narrative  . Not on file   Social Determinants of Health   Financial Resource Strain: Not on file  Food Insecurity: Not on file  Transportation Needs: Not on file  Physical Activity: Not on file  Stress: Not on file  Social Connections: Not on file  Intimate Partner Violence: Not on file    Current Outpatient Medications on File Prior to Visit  Medication Sig Dispense Refill  . albuterol (VENTOLIN HFA) 108 (90 Base) MCG/ACT inhaler Inhale 2 puffs into the lungs every 6 (six) hours as needed for wheezing or shortness of breath. 8 g 11  . ARIPiprazole (ABILIFY) 5 MG tablet Take 1 tablet (5 mg total) by mouth daily. 30 tablet 2  . Artificial Tear Ointment (DRY EYES OP) Place 1-2 drops into both  eyes daily as needed (dry eyes).     Marland Kitchen aspirin EC 81 MG tablet Take 1 tablet (81 mg total) by mouth 2 (two) times daily. 60 tablet 2  . atorvastatin (LIPITOR) 40 MG tablet Take 1 tablet (40 mg total) by mouth every evening. (Patient taking differently: Take 40 mg by mouth every morning.) 90 tablet 2  . famotidine (PEPCID) 20 MG tablet Take 1 tablet (20 mg total) by mouth 2 (two) times daily. 60 tablet 11  . FLUoxetine (PROZAC) 40 MG capsule Take 2 capsules (80 mg total) by mouth every morning. 60 capsule 2  . gabapentin (NEURONTIN) 800 MG tablet TAKE 2 TABLETS BY MOUTH IN THE MORNING AND 3 TABLETS  IN THE EVENING 150 tablet 11  . gentamicin cream (GARAMYCIN) 0.1 % Apply 1 application topically 2 (two)  times daily. 30 g 1  . glucose blood (ONE TOUCH ULTRA TEST) test strip 1 each by Other route 2 (two) times daily. And lancets 2/day. 100 each 0  . hydrOXYzine (ATARAX/VISTARIL) 25 MG tablet Take 1 tablet (25 mg total) by mouth 3 (three) times daily as needed. 90 tablet 2  . metFORMIN (GLUCOPHAGE) 1000 MG tablet TAKE 1 TABLET BY MOUTH TWICE DAILY WITH A MEAL 180 tablet 0  . Multiple Vitamins-Minerals (MULTIVITAMIN GUMMIES ADULT PO) Take 1 capsule by mouth daily.    . naproxen sodium (ALEVE) 220 MG tablet Take 220 mg by mouth daily as needed (headache).    . pantoprazole (PROTONIX) 40 MG tablet Take 1 tablet (40 mg total) by mouth daily. 90 tablet 3  . spironolactone (ALDACTONE) 100 MG tablet Take 1 tablet (100 mg total) by mouth daily. 30 tablet 11  . traZODone (DESYREL) 50 MG tablet Take 1 tablet (50 mg total) by mouth at bedtime. 30 tablet 2   No current facility-administered medications on file prior to visit.    No Known Allergies  Family History  Problem Relation Age of Onset  . Depression Mother   . Anxiety disorder Mother   . Diabetes Mother   . Diabetes Father   . Diabetes Brother     BP 138/60   Pulse 80   Wt 172 lb (78 kg)   SpO2 99%   BMI 29.52 kg/m    Review of Systems Denies n/v.  She has lost 26 lbs since last ov.      Objective:   Physical Exam VITAL SIGNS:  See vs page GENERAL: no distress Pulses: dorsalis pedis intact bilat.   MSK: no deformity of the feet, except right great toe is surgically absent.   CV: no leg edema Skin:  no ulcer on the feet.  normal color and temp on the feet. Neuro: sensation is intact to touch on the feet, but severely decreased from normal.    Lab Results  Component Value Date   CREATININE 0.88 06/26/2019   BUN 18 06/26/2019   NA 138 06/26/2019   K 4.5 06/26/2019   CL 101 06/26/2019   CO2 21 (L) 06/26/2019   Lab Results  Component Value Date   HGBA1C 12.5 (A) 02/05/2020       Assessment & Plan:  atypical DM:  uncontrolled. I advised insulin.  She refuses.  I advised her of risk of DKA.    Patient Instructions  check your blood sugar twice a day: before the 3 meals, and at bedtime.  also check if you have symptoms of your blood sugar being too high or too low.  please keep a  record of the readings and bring it to your next appointment here (or you can bring the meter itself).  You can write it on any piece of paper.  please call us sooner if your blood sugar goes below 70, or if you have a lot of readings over 200. Please continue the same metformin.  Drinking fluids also helps the blood sugar.   Blood tests are requested for you today.  We'll let you know about the results.  Please come back for a follow-up appointment in 1 month.

## 2020-02-06 ENCOUNTER — Telehealth (HOSPITAL_COMMUNITY): Payer: Self-pay | Admitting: *Deleted

## 2020-02-06 ENCOUNTER — Other Ambulatory Visit (HOSPITAL_COMMUNITY): Payer: Self-pay | Admitting: Psychiatry

## 2020-02-06 ENCOUNTER — Inpatient Hospital Stay: Admit: 2020-02-06 | Payer: Medicare Other | Admitting: Neurosurgery

## 2020-02-06 DIAGNOSIS — F900 Attention-deficit hyperactivity disorder, predominantly inattentive type: Secondary | ICD-10-CM

## 2020-02-06 SURGERY — POSTERIOR LUMBAR FUSION 2 LEVEL
Anesthesia: General | Site: Back

## 2020-02-06 MED ORDER — AMPHETAMINE-DEXTROAMPHETAMINE 15 MG PO TABS
15.0000 mg | ORAL_TABLET | Freq: Two times a day (BID) | ORAL | 0 refills | Status: DC
Start: 2020-02-06 — End: 2020-02-10

## 2020-02-06 NOTE — Telephone Encounter (Signed)
VM left for writer requesting an Rx for her Xanax and Adderall. SHe has a current rx for Xanax but she is out of Adderall. Will forward concern to Dr Doyne Keel.

## 2020-02-06 NOTE — Telephone Encounter (Signed)
Adderall filled and sent to preferred pharmacy. At this time Xanax not filled.

## 2020-02-10 ENCOUNTER — Telehealth (INDEPENDENT_AMBULATORY_CARE_PROVIDER_SITE_OTHER): Payer: Medicare Other | Admitting: Psychiatry

## 2020-02-10 ENCOUNTER — Encounter (HOSPITAL_COMMUNITY): Payer: Self-pay | Admitting: Psychiatry

## 2020-02-10 ENCOUNTER — Other Ambulatory Visit: Payer: Self-pay

## 2020-02-10 DIAGNOSIS — F900 Attention-deficit hyperactivity disorder, predominantly inattentive type: Secondary | ICD-10-CM

## 2020-02-10 DIAGNOSIS — F314 Bipolar disorder, current episode depressed, severe, without psychotic features: Secondary | ICD-10-CM | POA: Diagnosis not present

## 2020-02-10 DIAGNOSIS — F332 Major depressive disorder, recurrent severe without psychotic features: Secondary | ICD-10-CM

## 2020-02-10 DIAGNOSIS — F411 Generalized anxiety disorder: Secondary | ICD-10-CM | POA: Diagnosis not present

## 2020-02-10 MED ORDER — ARIPIPRAZOLE 10 MG PO TABS: 10.0000 mg | ORAL_TABLET | Freq: Every day | ORAL | 2 refills | Status: AC

## 2020-02-10 MED ORDER — AMPHETAMINE-DEXTROAMPHETAMINE 15 MG PO TABS: 15.0000 mg | ORAL_TABLET | Freq: Two times a day (BID) | ORAL | 0 refills | Status: DC

## 2020-02-10 MED ORDER — TRAZODONE HCL 50 MG PO TABS: 50.0000 mg | ORAL_TABLET | Freq: Every day | ORAL | 2 refills | Status: DC

## 2020-02-10 MED ORDER — HYDROXYZINE HCL 50 MG PO TABS: 50.0000 mg | ORAL_TABLET | Freq: Three times a day (TID) | ORAL | 2 refills | Status: AC | PRN

## 2020-02-10 MED ORDER — FLUOXETINE HCL 40 MG PO CAPS: 80.0000 mg | ORAL_CAPSULE | Freq: Every morning | ORAL | 2 refills | Status: AC

## 2020-02-10 MED ORDER — ALPRAZOLAM 0.5 MG PO TABS: 0.5000 mg | ORAL_TABLET | Freq: Two times a day (BID) | ORAL | 0 refills | Status: DC | PRN

## 2020-02-10 NOTE — Progress Notes (Signed)
BH MD/PA/NP OP Progress Note Virtual Visit via Video Note  I connected with [name] on 02/10/20 at 10:30 AM EST by a video enabled telemedicine application and verified that I am speaking with the correct person using two identifiers.  Location: Patient: Home Provider: Clinic   I discussed the limitations of evaluation and management by telemedicine and the availability of in person appointments. The patient expressed understanding and agreed to proceed.  I provided 30 minutes of non-face-to-face time during this encounter.    02/10/2020 10:49 AM [name]  MRN:  [id number]  Chief Complaint:  "I am extremely stressed"  HPI: 50 year old female seen today for followup psychiatric evaluation.   She has a  psychiatric history of PTSD, bipolar disorder, anxiety, ADHD, depression. She is currently prescribed Prozac 80 mg daily, Adderall 15 mg BID, Abilify 5 mg, hydroxyzine 25 mg three times daily as needed, and Xanax 0.5 mg twice daily PRN.  Patient is also prescribed Gabapentin however receives it from her PCP.   Today she is pleasant,cooperative, engaged in conversation, and she maintained eye contact.  She informed provider that she has been extremely stressed.  She notes that she and her husband are attempting to sell their home to move to [place] however she reports that it is difficult.  She informed provider that she canceled her back surgery because she thought that her home improvements and the sale of their house would be complete however she notes that it has not.  She informed provider that she is in constant pain because she has not had surgery on her back.  She informed provider that because of her pain she sleeps 4 to 5 hours nightly.  She informed provider that she dislikes trazodone and takes it infrequently because it makes her feel groggy.  Today provider conducted a PHQ-9 was conducted and patient scored an 20, at her last visit she scored a 18.  A GAD-7 was also  conducted and patient scored a 25, at her last visit she scored a 17.   Patient notes she continues to have auditory hallucinations but reports that they have diminished since starting Abilify.  Today she denies SI/HI/VH at this time.  She is agreeable to increasing Abilify 5 mg to 10 mg to help improve depression and symptoms of psychosis.  She is also agreeable to increasing hydroxyzine 25 mg 3 times daily to 50 mg 3 times daily to help manage anxiety and sleep (she notes that hydroxyzine helps her sleep).  She will continue all other medications as prescribed. She will follow-up with outpatient counselor for therapy.  No other concerns noted at this time.  Visit Diagnosis:    ICD-10-CM   1. Generalized anxiety disorder  F41.1 ALPRAZolam (XANAX) 0.5 MG tablet    hydrOXYzine (ATARAX/VISTARIL) 50 MG tablet  2. Attention deficit hyperactivity disorder (ADHD), predominantly inattentive type  F90.0 amphetamine-dextroamphetamine (ADDERALL) 15 MG tablet  3. Bipolar affective disorder, depressed, severe (HCC)  F31.4 ARIPiprazole (ABILIFY) 10 MG tablet    traZODone (DESYREL) 50 MG tablet    FLUoxetine (PROZAC) 40 MG capsule  4. Severe episode of recurrent major depressive disorder, without psychotic features (HCC)  F33.2 ARIPiprazole (ABILIFY) 10 MG tablet    FLUoxetine (PROZAC) 40 MG capsule    Past Psychiatric History: PTSD, Bipolar disorder, anxiety, ADHD, depression  Past Medical History:  Past Medical History:  Diagnosis Date  . Anxiety   . Depression   . Diabetes mellitus without complication (HCC)   .  High cholesterol   . Hypertension   . Neuropathy   . PCOS (polycystic ovarian syndrome)   . Type 1 diabetes [facility])     Past Surgical History:  Procedure Laterality Date  . AMPUTATION Right 06/12/2019   Procedure: AMPUTATION RAY RIGHT GREAT TOE;  Surgeon: Toni Arthurs, MD;  Location: [facility] OR;  Service: Orthopedics;  Laterality: Right;  . ELBOW SURGERY    . INSERTION OF AHMED VALVE Right  09/07/2015   Procedure: INSERTION OF AHMED VALVE;  Surgeon: Edmon Crape, MD;  Location: [facility] OR;  Service: Ophthalmology;  Laterality: Right;  . LASER PHOTO ABLATION Right 09/07/2015   Procedure: LASER PHOTO ABLATION;  Surgeon: Edmon Crape, MD;  Location: [facility] OR;  Service: Ophthalmology;  Laterality: Right;  . PARS PLANA VITRECTOMY Right 09/07/2015   Procedure: PARS PLANA VITRECTOMY WITH 25 GAUGE,, with panretinal endolaser,;  Surgeon: Edmon Crape, MD;  Location: [facility] OR;  Service: Ophthalmology;  Laterality: Right;    Family Psychiatric History: Mother schizophrenia, bipolar, anxiety, and depression   Family History:  Family History  Problem Relation Age of Onset  . Depression Mother   . Anxiety disorder Mother   . Diabetes Mother   . Diabetes Father   . Diabetes Brother     Social History:  Social History   Socioeconomic History  . Marital status: Married    Spouse name: Not on file  . Number of children: Not on file  . Years of education: Not on file  . Highest education level: Not on file  Occupational History  . Not on file  Tobacco Use  . Smoking status: Former Smoker    Types: Cigarettes    Quit date: 09/12/2000    Years since quitting: 19.4  . Smokeless tobacco: Never Used  Vaping Use  . Vaping Use: Never used  Substance and Sexual Activity  . Alcohol use: Not Currently    Comment: occasional  . Drug use: Yes    Types: Marijuana    Comment: daily   . Sexual activity: Yes    Birth control/protection: None, Condom  Other Topics Concern  . Not on file  Social History Narrative  . Not on file   Social Determinants of Health   Financial Resource Strain: Not on file  Food Insecurity: Not on file  Transportation Needs: Not on file  Physical Activity: Not on file  Stress: Not on file  Social Connections: Not on file    Allergies:  No Known Allergies  Metabolic Disorder Labs: Lab Results  Component Value Date   HGBA1C 12.5 (A) 02/05/2020   MPG 269  06/11/2019   MPG 220.21 02/25/2019   No results found for: PROLACTIN Lab Results  Component Value Date   CHOL 297 (H) 08/25/2019   TRIG 344 (H) 08/25/2019   HDL 58 08/25/2019   CHOLHDL 5.1 (H) 08/25/2019   VLDL 55 (H) 09/18/2013   LDLCALC 173 (H) 08/25/2019   LDLCALC 176 (H) 05/19/2019   Lab Results  Component Value Date   TSH 2.580 06/03/2018    Therapeutic Level Labs: Lab Results  Component Value Date   LITHIUM 0.42 (L) 08/13/2014   LITHIUM 0.83 08/12/2014   Lab Results  Component Value Date   VALPROATE <10.0 (L) 08/24/2013   VALPROATE <10.0 (L) 08/21/2013   No components found for:  CBMZ  Current Medications: Current Outpatient Medications  Medication Sig Dispense Refill  . albuterol (VENTOLIN HFA) 108 (90 Base) MCG/ACT inhaler Inhale 2 puffs into the lungs  every 6 (six) hours as needed for wheezing or shortness of breath. 8 g 11  . ALPRAZolam (XANAX) 0.5 MG tablet Take 1 tablet (0.5 mg total) by mouth 2 (two) times daily as needed. for anxiety 60 tablet 0  . amphetamine-dextroamphetamine (ADDERALL) 15 MG tablet Take 1 tablet by mouth 2 (two) times daily. 60 tablet 0  . ARIPiprazole (ABILIFY) 10 MG tablet Take 1 tablet (10 mg total) by mouth daily. 30 tablet 2  . Artificial Tear Ointment (DRY EYES OP) Place 1-2 drops into both eyes daily as needed (dry eyes).     Marland Kitchen aspirin EC 81 MG tablet Take 1 tablet (81 mg total) by mouth 2 (two) times daily. 60 tablet 2  . atorvastatin (LIPITOR) 40 MG tablet Take 1 tablet (40 mg total) by mouth every evening. (Patient taking differently: Take 40 mg by mouth every morning.) 90 tablet 2  . famotidine (PEPCID) 20 MG tablet Take 1 tablet (20 mg total) by mouth 2 (two) times daily. 60 tablet 11  . FLUoxetine (PROZAC) 40 MG capsule Take 2 capsules (80 mg total) by mouth every morning. 60 capsule 2  . gabapentin (NEURONTIN) 800 MG tablet TAKE 2 TABLETS BY MOUTH IN THE MORNING AND 3 TABLETS  IN THE EVENING 150 tablet 11  . gentamicin cream  (GARAMYCIN) 0.1 % Apply 1 application topically 2 (two) times daily. 30 g 1  . glucose blood (ONE TOUCH ULTRA TEST) test strip 1 each by Other route 2 (two) times daily. And lancets 2/day. 100 each 0  . hydrOXYzine (ATARAX/VISTARIL) 50 MG tablet Take 1 tablet (50 mg total) by mouth 3 (three) times daily as needed. 90 tablet 2  . metFORMIN (GLUCOPHAGE) 1000 MG tablet TAKE 1 TABLET BY MOUTH TWICE DAILY WITH A MEAL 180 tablet 0  . Multiple Vitamins-Minerals (MULTIVITAMIN GUMMIES ADULT PO) Take 1 capsule by mouth daily.    . naproxen sodium (ALEVE) 220 MG tablet Take 220 mg by mouth daily as needed (headache).    . pantoprazole (PROTONIX) 40 MG tablet Take 1 tablet (40 mg total) by mouth daily. 90 tablet 3  . spironolactone (ALDACTONE) 100 MG tablet Take 1 tablet (100 mg total) by mouth daily. 30 tablet 11  . traZODone (DESYREL) 50 MG tablet Take 1 tablet (50 mg total) by mouth at bedtime. 30 tablet 2   No current facility-administered medications for this visit.     Musculoskeletal: Strength & Muscle Tone: Unable to assess due to telehealth visit Gait & Station: Unable to assess due to telehealth visit Patient leans: N/A  Psychiatric Specialty Exam: Review of Systems  There were no vitals taken for this visit.There is no height or weight on file to calculate BMI.  General Appearance: Well Groomed  Eye Contact:  Good  Speech:  Clear and Coherent and Normal Rate  Volume:  Normal  Mood:  Anxious and Depressed  Affect:  Congruent  Thought Process:  Coherent, Goal Directed and Linear  Orientation:  Full (Time, Place, and Person)  Thought Content: WDL and Logical   Suicidal Thoughts:  No  Homicidal Thoughts:  No  Memory:  Immediate;   Good Recent;   Good Remote;   Good  Judgement:  Good  Insight:  Good  Psychomotor Activity:  Normal  Concentration:  Concentration: Good and Attention Span: Good  Recall:  Good  Fund of Knowledge: Good  Language: Good  Akathisia:  No  Handed:  Right   AIMS (if indicated): Not done  Assets:  Communication  Skills Desire for Improvement Financial Resources/Insurance Housing  ADL's:  Intact  Cognition: WNL  Sleep:  Fair   Screenings: AUDIT   Flowsheet Row [facility]  Alcohol Use Disorder Identification Test Final Score (AUDIT) 10    GAD-7   Flowsheet Row Video Visit from 02/10/2020 in [facility] Office Visit from 11/26/2019 in [facility] Video Visit from 11/10/2019 in [facility]  Total GAD-7 Score 20 19 17     PHQ2-9   Flowsheet Row Video Visit from 02/10/2020 in [facility] Office Visit from 11/26/2019 in [facility] Telemedicine from 11/13/2019 in [facility] Video Visit from 11/10/2019 in [facility] Office Visit from 05/19/2019 in [facility]  PHQ-2 Total Score 6 6 0 4 0  PHQ-9 Total Score 25 13 -- 18 --    Flowsheet Row ED to Hosp-Admission (Discharged) from 06/11/2019 in [facility] ED to Hosp-Admission (Discharged) from 02/25/2019 in [facility] Office Visit from 06/03/2018 in [facility]  C-SSRS RISK CATEGORY Error: Question 6 not populated Error: Q3, 4, or 5 should not be populated when Q2 is No Error: Q3, 4, or 5 should not be populated when Q2 is No       Assessment and Plan: Patient endorses symptoms of anxiety, depression, and auditory hallucinations. She is agreeable to increasing Abilify 5 mg to 10 mg to help improve depression and symptoms of psychosis.  She is also agreeable to increasing hydroxyzine 25 mg 3 times daily to 50 mg 3 times daily to help manage anxiety and sleep (she notes that hydroxyzine helps her sleep).  She will continue all other medications as  prescribed.   1. Generalized anxiety disorder  Continue- ALPRAZolam (XANAX) 0.5 MG tablet; Take 1 tablet (0.5 mg total) by mouth 2 (two) times daily as needed. for anxiety  Dispense: 60 tablet; Refill: 0 Increased- hydrOXYzine (ATARAX/VISTARIL) 50 MG tablet; Take 1 tablet (50 mg total) by mouth 3 (three) times daily as needed.  Dispense: 90 tablet; Refill: 2  2. Attention deficit hyperactivity disorder (ADHD), predominantly inattentive type  Continue- amphetamine-dextroamphetamine (ADDERALL) 15 MG tablet; Take 1 tablet by mouth 2 (two) times daily.  Dispense: 60 tablet; Refill: 0  3. Bipolar affective disorder, depressed, severe (HCC)  Increased- ARIPiprazole (ABILIFY) 10 MG tablet; Take 1 tablet (10 mg total) by mouth daily.  Dispense: 30 tablet; Refill: 2 Continue- traZODone (DESYREL) 50 MG tablet; Take 1 tablet (50 mg total) by mouth at bedtime.  Dispense: 30 tablet; Refill: 2 Continue- FLUoxetine (PROZAC) 40 MG capsule; Take 2 capsules (80 mg total) by mouth every morning.  Dispense: 60 capsule; Refill: 2  4. Severe episode of recurrent major depressive disorder, without psychotic features (HCC)  Increased- ARIPiprazole (ABILIFY) 10 MG tablet; Take 1 tablet (10 mg total) by mouth daily.  Dispense: 30 tablet; Refill: 2 Continue- FLUoxetine (PROZAC) 40 MG capsule; Take 2 capsules (80 mg total) by mouth every morning.  Dispense: 60 capsule; Refill: 2      Follow-up in 41months Follow-up with therapy   1month, NP 02/10/2020, 10:49 AM

## 2020-02-11 ENCOUNTER — Ambulatory Visit (INDEPENDENT_AMBULATORY_CARE_PROVIDER_SITE_OTHER): Payer: Medicare Other | Admitting: Licensed Clinical Social Worker

## 2020-02-11 ENCOUNTER — Other Ambulatory Visit: Payer: Self-pay

## 2020-02-11 DIAGNOSIS — F418 Other specified anxiety disorders: Secondary | ICD-10-CM

## 2020-02-14 ENCOUNTER — Emergency Department (HOSPITAL_COMMUNITY)
Admission: EM | Admit: 2020-02-14 | Discharge: 2020-02-14 | Disposition: A | Discharge status: Home / Self Care | Payer: Medicare Other | Attending: Emergency Medicine | Admitting: Emergency Medicine

## 2020-02-14 ENCOUNTER — Encounter (HOSPITAL_COMMUNITY): Payer: Self-pay | Admitting: Emergency Medicine

## 2020-02-14 ENCOUNTER — Other Ambulatory Visit: Payer: Self-pay

## 2020-02-14 DIAGNOSIS — E10319 Type 1 diabetes mellitus with unspecified diabetic retinopathy without macular edema: Secondary | ICD-10-CM | POA: Diagnosis not present

## 2020-02-14 DIAGNOSIS — Z6379 Other stressful life events affecting family and household: Secondary | ICD-10-CM | POA: Insufficient documentation

## 2020-02-14 DIAGNOSIS — X789XXA Intentional self-harm by unspecified sharp object, initial encounter: Secondary | ICD-10-CM | POA: Insufficient documentation

## 2020-02-14 DIAGNOSIS — Z79899 Other long term (current) drug therapy: Secondary | ICD-10-CM | POA: Insufficient documentation

## 2020-02-14 DIAGNOSIS — S51812A Laceration without foreign body of left forearm, initial encounter: Secondary | ICD-10-CM | POA: Diagnosis not present

## 2020-02-14 DIAGNOSIS — Z87891 Personal history of nicotine dependence: Secondary | ICD-10-CM | POA: Diagnosis not present

## 2020-02-14 DIAGNOSIS — Z7982 Long term (current) use of aspirin: Secondary | ICD-10-CM | POA: Diagnosis not present

## 2020-02-14 DIAGNOSIS — E1040 Type 1 diabetes mellitus with diabetic neuropathy, unspecified: Secondary | ICD-10-CM | POA: Diagnosis not present

## 2020-02-14 DIAGNOSIS — Y92009 Unspecified place in unspecified non-institutional (private) residence as the place of occurrence of the external cause: Secondary | ICD-10-CM | POA: Insufficient documentation

## 2020-02-14 DIAGNOSIS — Z7289 Other problems related to lifestyle: Secondary | ICD-10-CM

## 2020-02-14 DIAGNOSIS — S59912A Unspecified injury of left forearm, initial encounter: Secondary | ICD-10-CM | POA: Diagnosis present

## 2020-02-14 DIAGNOSIS — I10 Essential (primary) hypertension: Secondary | ICD-10-CM | POA: Insufficient documentation

## 2020-02-14 DIAGNOSIS — Z659 Problem related to unspecified psychosocial circumstances: Secondary | ICD-10-CM

## 2020-02-14 MED ORDER — LIDOCAINE HCL (PF) 1 % IJ SOLN
5.0000 mL | Freq: Once | INTRAMUSCULAR | Status: AC
  Administered 2020-02-14: 5 mL
  Filled 2020-02-14: qty 30

## 2020-02-14 MED ORDER — BACITRACIN ZINC 500 UNIT/GM EX OINT
TOPICAL_OINTMENT | CUTANEOUS | Status: AC
  Administered 2020-02-14: 1
  Filled 2020-02-14: qty 2.7
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
## 2020-02-14 NOTE — ED Triage Notes (Addendum)
Per EMS, patient from home, self inflicted laceration to left forearm. Hx of same. Reports SI initially but denies SI at this time. Patient adds "I was just trying to relieve my stress."

## 2020-02-14 NOTE — Discharge Instructions (Signed)
1.  Keep antibiotic ointment and a dressing on your wound.  You may shower and allow clean soapy water on the wound within 24 hours.  Rinse and dry the wound well, pat dry and apply ointment and dressing. 2.  See your family doctor for removal of sutures in 7 days. 3.  Follow-up with your psychologist within a week. 4.  Return to the emergency department if you have any signs of infection, thoughts of hurting yourself or other concerning symptoms.

## 2020-02-16 ENCOUNTER — Ambulatory Visit (HOSPITAL_COMMUNITY): Payer: Medicare Other | Admitting: Licensed Clinical Social Worker

## 2020-02-16 ENCOUNTER — Ambulatory Visit: Payer: Self-pay | Admitting: Nurse Practitioner

## 2020-02-18 NOTE — Progress Notes (Signed)
   THERAPIST PROGRESS NOTE   Virtual Visit via Video Note  I connected with [name] on 02/11/20 at  3:00 PM EST by a video enabled telemedicine application and verified that I am speaking with the correct person using two identifiers.  Location: Patient: Home Provider: [facility]   I discussed the limitations of evaluation and management by telemedicine and the availability of in person appointments. The patient expressed understanding and agreed to proceed. I discussed the assessment and treatment plan with the patient. The patient was provided an opportunity to ask questions and all were answered. The patient agreed with the plan and demonstrated an understanding of the instructions.  I provided 45 minutes of non-face-to-face time during this encounter.  Participation Level: Active  Behavioral Response: CasualAlertEuthymic  Type of Therapy: Individual Therapy  Treatment Goals addressed: Communication: Dep/Anx/Coping   Interventions: Supportive  Summary: [name] is a 50 y.o. female who presents with hx of dep/anx/numerous chronic physical health problems. This date pt signs on for video session. She presents in a positive mood stating "I can see you". Pt advises she had her eye surgery on L eye ~ 3 wks ago and can now see well enough to read without her glasses and complete many tasks she could not do independently. She states she can see herself for the first time in a long time. Pt provides many details of the freedoms she is enjoying. Pt states this has reduced her stress and stress for her husband/relationship. Pt advises husband's grandmother helped to clear an old bill for her so she could have the surgery right away. Pt provides updates on move to [place]. She states she is expecting an offer on their home here in a day or two from [employer] who will make offer as is and do all needed repairs. Pt plans to wait on her back surgery until she gets to [place]. They are now looking at  a different property in [place] and plan to travel there 02/24. Pt states she is taking meds as prescribed. She continues to eliminate cannabis use and feels good about same. Pt able to employ many more coping tools, including her love of painting, given her sight renewal. Pt denies other worries/concerns this date. LCSW reviewed poc including scheduling prior to close of session. Pt states appreciation for care.    Suicidal/Homicidal: Intermittent thoughts on bad dayswithout intent/plan  Therapist Response: Pt remains receptive to care.  Plan: Return again for next avail and/or walk in prn.  Diagnosis: Axis I: Depression with anxiety    Axis II: Deferred  [place] Sink, LCSW 02/18/2020

## 2020-02-23 ENCOUNTER — Ambulatory Visit (INDEPENDENT_AMBULATORY_CARE_PROVIDER_SITE_OTHER): Payer: Medicare Other | Admitting: Podiatry

## 2020-02-23 ENCOUNTER — Other Ambulatory Visit: Payer: Self-pay

## 2020-02-23 ENCOUNTER — Ambulatory Visit: Payer: Medicare Other | Admitting: Orthotics

## 2020-02-23 DIAGNOSIS — E0843 Diabetes mellitus due to underlying condition with diabetic autonomic (poly)neuropathy: Secondary | ICD-10-CM

## 2020-02-23 DIAGNOSIS — L97522 Non-pressure chronic ulcer of other part of left foot with fat layer exposed: Secondary | ICD-10-CM | POA: Diagnosis not present

## 2020-02-23 NOTE — Progress Notes (Signed)
   Subjective:  50 y.o. female with PMHx of diabetes mellitus presenting today for follow-up evaluation of an ulcer that developed to the left 5th toe.  Patient has been applying the antibiotic cream.  She denies pain to the area.  No new complaints at this time  Past Medical History:  Diagnosis Date  . Anxiety   . Depression   . Diabetes mellitus without complication (HCC)   . High cholesterol   . Hypertension   . Neuropathy   . PCOS (polycystic ovarian syndrome)   . Type 1 diabetes (HCC)       Objective/Physical Exam General: The patient is alert and oriented x3 in no acute distress.  Dermatology:  Wound #1 noted to the left fifth toe measuring approximately 0.6 x 0.6 x 0.2 cm (LxWxD).   To the noted ulceration(s), there is no eschar. There is a moderate amount of slough, fibrin, and necrotic tissue noted. Granulation tissue and wound base is red. There is a minimal amount of serosanguineous drainage noted. There is no exposed bone muscle-tendon ligament or joint. There is no malodor. Periwound integrity is intact. Skin is warm, dry and supple bilateral lower extremities.  Vascular: Palpable pedal pulses bilaterally. No edema or erythema noted. Capillary refill within normal limits.  Neurological: Epicritic and protective threshold diminished bilaterally.   Musculoskeletal Exam: History of right hallux amputation.  June 2021  Assessment: 1.  Ulcer left fifth toe secondary to diabetes mellitus 2. diabetes mellitus w/ peripheral neuropathy   Plan of Care:  1. Patient was evaluated. 2. medically necessary excisional debridement including subcutaneous tissue was performed using a tissue nipper and a chisel blade. Excisional debridement of all the necrotic nonviable tissue down to healthy bleeding viable tissue was performed with post-debridement measurements same as pre-. 3. the wound was cleansed and dry sterile dressing applied. 4.  Continue gentamicin cream daily 5.   Patient has an appointment today with our Pedorthist for new diabetic shoes and insoles 6.  Return to clinic in 3 weeks  *Planning on moving to [place] in the next couple of months   Felecia Shelling, DPM [facility]  Dr. Felecia Shelling, DPM    [address] [place], [place] [address]                Office [phone]  Fax [phone]

## 2020-02-24 ENCOUNTER — Telehealth: Payer: Self-pay | Admitting: Endocrinology

## 2020-02-24 NOTE — Telephone Encounter (Signed)
Last visit pt states Dr.Ellison offered to fill her an insulin pen but at the time she chose not to. Pt has changed her mind since then and would like Dr to fill out the prescription for an insulin pen.  She would like it to be sent to: [id number] - [place] ([place]), [place] - [address] Phone:  [phone]  Fax:  [phone]

## 2020-02-25 ENCOUNTER — Ambulatory Visit: Payer: Self-pay | Admitting: Nurse Practitioner

## 2020-02-25 MED ORDER — BASAGLAR KWIKPEN 100 UNIT/ML ~~LOC~~ SOPN: 20.0000 [IU] | PEN_INJECTOR | SUBCUTANEOUS | 3 refills | Status: DC

## 2020-02-25 NOTE — Telephone Encounter (Signed)
OK, I have sent a prescription to your pharmacy, to start basaglar.  If an alternative is preferred, please let us know.

## 2020-02-25 NOTE — Telephone Encounter (Signed)
Please advise 

## 2020-02-26 NOTE — Telephone Encounter (Signed)
LVM--regarding medication sent to the pharmacy. 

## 2020-03-01 ENCOUNTER — Telehealth: Payer: Self-pay | Admitting: Podiatry

## 2020-03-01 NOTE — Telephone Encounter (Signed)
Called pt because the shoe she picked out is not available in her width and she is going to call me next week to set up a time to come in to pick out another shoe.

## 2020-03-11 ENCOUNTER — Other Ambulatory Visit: Payer: Self-pay

## 2020-03-11 ENCOUNTER — Ambulatory Visit (INDEPENDENT_AMBULATORY_CARE_PROVIDER_SITE_OTHER): Payer: Medicare Other | Admitting: Licensed Clinical Social Worker

## 2020-03-11 DIAGNOSIS — F418 Other specified anxiety disorders: Secondary | ICD-10-CM

## 2020-03-12 ENCOUNTER — Ambulatory Visit: Payer: Medicare Other | Admitting: Endocrinology

## 2020-03-15 ENCOUNTER — Other Ambulatory Visit: Payer: Self-pay

## 2020-03-15 ENCOUNTER — Ambulatory Visit (INDEPENDENT_AMBULATORY_CARE_PROVIDER_SITE_OTHER): Payer: Medicare Other | Admitting: Podiatry

## 2020-03-15 DIAGNOSIS — L97522 Non-pressure chronic ulcer of other part of left foot with fat layer exposed: Secondary | ICD-10-CM | POA: Diagnosis not present

## 2020-03-15 DIAGNOSIS — E0843 Diabetes mellitus due to underlying condition with diabetic autonomic (poly)neuropathy: Secondary | ICD-10-CM | POA: Diagnosis not present
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
## 2020-03-17 ENCOUNTER — Ambulatory Visit (HOSPITAL_COMMUNITY): Payer: Medicare Other | Admitting: Licensed Clinical Social Worker

## 2020-03-24 NOTE — Telephone Encounter (Signed)
Patient has since cancelled services with counselor at this time. Encounter being signed and closed.

## 2020-03-26 ENCOUNTER — Other Ambulatory Visit: Payer: Self-pay | Admitting: Nurse Practitioner

## 2020-03-26 ENCOUNTER — Telehealth: Payer: Self-pay

## 2020-03-26 DIAGNOSIS — E1149 Type 2 diabetes mellitus with other diabetic neurological complication: Secondary | ICD-10-CM

## 2020-03-26 DIAGNOSIS — E119 Type 2 diabetes mellitus without complications: Secondary | ICD-10-CM

## 2020-03-26 NOTE — Telephone Encounter (Addendum)
Please disregard

## 2020-03-31 ENCOUNTER — Other Ambulatory Visit: Payer: Self-pay

## 2020-03-31 ENCOUNTER — Telehealth (HOSPITAL_COMMUNITY): Payer: Self-pay | Admitting: *Deleted

## 2020-03-31 ENCOUNTER — Ambulatory Visit (INDEPENDENT_AMBULATORY_CARE_PROVIDER_SITE_OTHER): Payer: Medicare Other | Admitting: Podiatry

## 2020-03-31 ENCOUNTER — Other Ambulatory Visit (HOSPITAL_COMMUNITY): Payer: Self-pay | Admitting: Psychiatry

## 2020-03-31 DIAGNOSIS — F411 Generalized anxiety disorder: Secondary | ICD-10-CM

## 2020-03-31 DIAGNOSIS — L97522 Non-pressure chronic ulcer of other part of left foot with fat layer exposed: Secondary | ICD-10-CM

## 2020-03-31 DIAGNOSIS — E0843 Diabetes mellitus due to underlying condition with diabetic autonomic (poly)neuropathy: Secondary | ICD-10-CM

## 2020-03-31 DIAGNOSIS — F900 Attention-deficit hyperactivity disorder, predominantly inattentive type: Secondary | ICD-10-CM

## 2020-03-31 MED ORDER — AMPHETAMINE-DEXTROAMPHETAMINE 15 MG PO TABS: 15.0000 mg | ORAL_TABLET | Freq: Two times a day (BID) | ORAL | 0 refills | Status: AC

## 2020-03-31 MED ORDER — ALPRAZOLAM 0.5 MG PO TABS: 0.5000 mg | ORAL_TABLET | Freq: Two times a day (BID) | ORAL | 0 refills | Status: DC | PRN

## 2020-03-31 NOTE — Telephone Encounter (Signed)
Medications refilled and sent to preferred pharmacy.

## 2020-03-31 NOTE — Progress Notes (Signed)
   Subjective:  50 y.o. female with PMHx of diabetes mellitus presenting today for follow-up evaluation of an ulcer that developed to the left 5th toe.  Patient has been applying the antibiotic cream.  She denies pain to the area.  No new complaints at this time  Past Medical History:  Diagnosis Date  . Anxiety   . Depression   . Diabetes mellitus without complication (HCC)   . High cholesterol   . Hypertension   . Neuropathy   . PCOS (polycystic ovarian syndrome)   . Type 1 diabetes (HCC)       Objective/Physical Exam General: The patient is alert and oriented x3 in no acute distress.  Dermatology:  Wound #1 noted to the left fifth toe measuring approximately 0.6 x 0.6 x 0.2 cm (LxWxD).   To the noted ulceration(s), there is no eschar. There is a moderate amount of slough, fibrin, and necrotic tissue noted. Granulation tissue and wound base is red. There is a minimal amount of serosanguineous drainage noted. There is no exposed bone muscle-tendon ligament or joint. There is no malodor. Periwound integrity is intact. Skin is warm, dry and supple bilateral lower extremities.  Vascular: Palpable pedal pulses bilaterally. No edema or erythema noted. Capillary refill within normal limits.  Neurological: Epicritic and protective threshold diminished bilaterally.   Musculoskeletal Exam: History of right hallux amputation.  June 2021  Assessment: 1.  Ulcer left fifth toe secondary to diabetes mellitus 2. diabetes mellitus w/ peripheral neuropathy   Plan of Care:  1. Patient was evaluated. 2. medically necessary excisional debridement including subcutaneous tissue was performed using a tissue nipper and a chisel blade. Excisional debridement of all the necrotic nonviable tissue down to healthy bleeding viable tissue was performed with post-debridement measurements same as pre-. 3. the wound was cleansed and dry sterile dressing applied. 4.  Continue gentamicin cream daily 5.   Patient has an appointment today with our Pedorthist for new diabetic shoes and insoles 6.  Return to clinic as needed.  Recommend that she follows up with a podiatrist in [place] when she moves  *Planning on moving to [place] in the next couple of months   Felecia Shelling, DPM [facility]  Dr. Felecia Shelling, DPM    [address] [place], [place] [address]                Office [phone]  Fax [phone]

## 2020-03-31 NOTE — Telephone Encounter (Signed)
VM left for writer requesting rx's for her Xanax and Adderall  to be called into her preferred pharmacy, which is [facility]. She should be out of them both. She has a next appt with Dr Doyne Keel on 05/10/20.

## 2020-04-01 ENCOUNTER — Telehealth (INDEPENDENT_AMBULATORY_CARE_PROVIDER_SITE_OTHER): Payer: Medicare Other | Admitting: Nurse Practitioner

## 2020-04-01 ENCOUNTER — Encounter: Payer: Self-pay | Admitting: Nurse Practitioner

## 2020-04-01 VITALS — BP 131/80 | Ht 64.0 in | Wt 184.0 lb

## 2020-04-01 DIAGNOSIS — R1907 Generalized intra-abdominal and pelvic swelling, mass and lump: Secondary | ICD-10-CM | POA: Diagnosis not present

## 2020-04-01 MED ORDER — TORSEMIDE 5 MG PO TABS: 5.0000 mg | ORAL_TABLET | Freq: Every day | ORAL | 0 refills | Status: AC

## 2020-04-01 NOTE — Progress Notes (Signed)
   Subjective:  50 y.o. female with PMHx of diabetes mellitus presenting today for follow-up evaluation of an ulcer that developed to the left 5th toe.  Patient has been applying the antibiotic cream.  She denies pain to the area.  No new complaints at this time  Past Medical History:  Diagnosis Date  . Anxiety   . Depression   . Diabetes mellitus without complication (HCC)   . High cholesterol   . Hypertension   . Neuropathy   . PCOS (polycystic ovarian syndrome)   . Type 1 diabetes (HCC)       Objective/Physical Exam General: The patient is alert and oriented x3 in no acute distress.  Dermatology:  Skin fissure noted to the left great toe.  There continues to be a small wound to the left fifth toe limited to the breakdown of skin Skin is warm, dry and supple bilateral lower extremities.  Vascular: Palpable pedal pulses bilaterally. No edema or erythema noted. Capillary refill within normal limits.  Neurological: Epicritic and protective threshold diminished bilaterally.   Musculoskeletal Exam: History of right hallux amputation.  June 2021  Assessment: 1.  Ulcer left fifth toe secondary to diabetes mellitus 2. diabetes mellitus w/ peripheral neuropathy   Plan of Care:  1. Patient was evaluated. 2.  Light debridement of the skin fissure was performed to the left great toe without any incident or bleeding. 3.  Continue covering the skin fissure with gentamicin cream under occlusion of a Band-Aid 4.  Patient admits to walking barefoot around the house.  Recommend good supportive shoes even around the house and diabetic shoes 5.  Return to clinic as needed  *Planning on moving to [place] in the next couple of months   Felecia Shelling, DPM [facility]  Dr. Felecia Shelling, DPM    [address] [place], [place] [address]                Office [phone]  Fax [phone]

## 2020-04-01 NOTE — Progress Notes (Signed)
   [facility] Patient [facility] [address] [place], [place]  [address] Phone:  [phone]   Fax:  [phone] Virtual Visit via Video Note  I connected with [name] on 04/01/20 at  3:00 PM EDT by video and verified that I am speaking with the correct person using two identifiers.   I discussed the limitations, risks, security and privacy concerns of performing an evaluation and management service by telephone and the availability of in person appointments. I also discussed with the patient that there may be a patient responsible charge related to this service. The patient expressed understanding and agreed to proceed.  Patient home Provider Office  History of Present Illness:  Edema Patient complains of edema in Generalized.  She describes it as "bloating "the edema has been moderate. Onset of symptoms was a few days ago, and patient reports symptoms have gradually worsened since that time. The edema is present all day. The patient states the problem is new. The swelling has been aggravated by nothing. The swelling has been relieved by nothing. Associated factors include: nothing. Cardiac risk factors include diabetes mellitus, dyslipidemia, hypertension, obesity (BMI >= 30 kg/m2) and sedentary lifestyle.  Currently on spironolactone and has been for several years.  She denies any changes in her medications She admits that her CBG has been 120-130's.  She has history of diabetes however is noncompliant with insulin therapy.   Observations/Objective: Review of Systems  Constitutional: Negative.        20 pound weight gain  HENT: Negative for congestion.   Respiratory: Negative for cough and shortness of breath.   Cardiovascular: Positive for leg swelling. Negative for chest pain.       Generalized swelling  She denies any edema Leg cramps at night  Gastrointestinal: Negative for constipation and diarrhea.  Genitourinary: Negative for dysuria, frequency and hematuria.   Musculoskeletal:       Leg cramps  Skin: Negative.   Neurological: Negative for dizziness and headaches.   No exam today due to video visit Assessment and Plan: Assessment  Primary Diagnosis & Pertinent Problem List: The encounter diagnosis was Generalized abdominal swelling.  Visit Diagnosis: 1. Generalized abdominal swelling  Trial of furosemide 5 mg daily for 5 days. Patient instructed if symptoms persist or get worse she is to follow-up in emergency room or primary care for further evaluation with lab work.     Follow Up Instructions: Follow-up as needed   I discussed the assessment and treatment plan with the patient. The patient was provided an opportunity to ask questions and all were answered. The patient agreed with the plan and demonstrated an understanding of the instructions.   The patient was advised to call back or seek an in-person evaluation if the symptoms worsen or if the condition fails to improve as anticipated.  I provided 9 minutes of video- face-to-face time during this encounter.   Barbette Merino, NP

## 2020-04-08 ENCOUNTER — Ambulatory Visit (HOSPITAL_COMMUNITY): Payer: Medicare Other | Admitting: Licensed Clinical Social Worker

## 2020-04-12 ENCOUNTER — Telehealth: Payer: Self-pay | Admitting: Endocrinology

## 2020-04-12 ENCOUNTER — Telehealth: Payer: Self-pay | Admitting: Podiatry

## 2020-04-12 NOTE — Telephone Encounter (Signed)
Pt calling regarding paper work for diabetic shoes, from  [facility]. States that the first page was filled out, second page was not filled out.

## 2020-04-12 NOTE — Telephone Encounter (Signed)
Pt called checking on status of diabetic shoes.  Upon checking we received one of the documents needed but not the 2nd document needed for pt to get the shoes from Dr Everardo All.(Information From Medical Records of In-Person Visit with DPM Documenting that Beneficiary has Qualifying Risk Factor(s) for Therapeutic Shoes)is what we are  Missing) Pt to call Dr George Hugh office to see if he will sign it. I apologized to the pt but this is a Adult nurse that we have to follow.

## 2020-04-12 NOTE — Telephone Encounter (Signed)
Pt called to also let Dr know if she doesn't get these shoes she will lose another toe so she wants to make sure the papers get filled out  Ph# [phone] in case of any questions

## 2020-04-14 NOTE — Telephone Encounter (Signed)
Message sent thru MyChart 

## 2020-04-19 ENCOUNTER — Other Ambulatory Visit (HOSPITAL_COMMUNITY): Payer: Self-pay | Admitting: Psychiatry

## 2020-04-19 DIAGNOSIS — F411 Generalized anxiety disorder: Secondary | ICD-10-CM

## 2020-04-30 ENCOUNTER — Telehealth: Payer: Self-pay | Admitting: Podiatry

## 2020-04-30 ENCOUNTER — Ambulatory Visit (INDEPENDENT_AMBULATORY_CARE_PROVIDER_SITE_OTHER): Payer: Medicare Other | Admitting: Endocrinology

## 2020-04-30 ENCOUNTER — Other Ambulatory Visit: Payer: Self-pay

## 2020-04-30 VITALS — BP 132/82 | HR 86 | Ht 64.0 in | Wt 178.6 lb

## 2020-04-30 DIAGNOSIS — E119 Type 2 diabetes mellitus without complications: Secondary | ICD-10-CM | POA: Diagnosis not present

## 2020-04-30 LAB — POCT GLYCOSYLATED HEMOGLOBIN (HGB A1C): Hemoglobin A1C: 11.6 % — AB (ref 4.0–5.6)

## 2020-04-30 MED ORDER — ONETOUCH VERIO VI STRP: 1.0000 | ORAL_STRIP | Freq: Two times a day (BID) | 3 refills | Status: AC

## 2020-04-30 MED ORDER — UNABLE TO FIND: 0 refills | Status: AC

## 2020-04-30 MED ORDER — HUMULIN 70/30 KWIKPEN (70-30) 100 UNIT/ML ~~LOC~~ SUPN: 30.0000 [IU] | PEN_INJECTOR | Freq: Every day | SUBCUTANEOUS | 3 refills | Status: DC

## 2020-04-30 NOTE — Progress Notes (Signed)
Subjective:    Patient ID: [name], female    DOB: [birth year], 50 y.o.   MRN: [id number]  HPI Pt returns for f/u of diabetes mellitus: DM type: 1 (but she is only intermittently ketosis-prone) Dx'ed: 1997 Complications: PPN, R great toe amputation, GP, DR, CAD, and foot ulcer.   Therapy: metformin GDM: never (G0) DKA: 2013 and 2019 Severe hypoglycemia: never. Pancreatitis: never Other: she often goes off the insulin for months at a time, due to bipolar disorder, and to control her weight; she declines to resume; she did not tolerate Jardiance or Trulicity.  Interval history: she resumed 70/30 insulin, 30 units qam.  She lost her cbg meter.  She has gained weight.  She is moving to [place] soon.   Past Medical History:  Diagnosis Date  . Anxiety   . Depression   . Diabetes mellitus without complication (HCC)   . High cholesterol   . Hypertension   . Neuropathy   . PCOS (polycystic ovarian syndrome)   . Type 1 diabetes [facility])     Past Surgical History:  Procedure Laterality Date  . AMPUTATION Right 06/12/2019   Procedure: AMPUTATION RAY RIGHT GREAT TOE;  Surgeon: Toni Arthurs, MD;  Location: [facility] OR;  Service: Orthopedics;  Laterality: Right;  . ELBOW SURGERY    . INSERTION OF AHMED VALVE Right 09/07/2015   Procedure: INSERTION OF AHMED VALVE;  Surgeon: Edmon Crape, MD;  Location: [facility] OR;  Service: Ophthalmology;  Laterality: Right;  . LASER PHOTO ABLATION Right 09/07/2015   Procedure: LASER PHOTO ABLATION;  Surgeon: Edmon Crape, MD;  Location: [facility] OR;  Service: Ophthalmology;  Laterality: Right;  . PARS PLANA VITRECTOMY Right 09/07/2015   Procedure: PARS PLANA VITRECTOMY WITH 25 GAUGE,, with panretinal endolaser,;  Surgeon: Edmon Crape, MD;  Location: [facility] OR;  Service: Ophthalmology;  Laterality: Right;    Social History   Socioeconomic History  . Marital status: Married    Spouse name: Not on file  . Number of children: Not on file  . Years of education: Not on file  .  Highest education level: Not on file  Occupational History  . Not on file  Tobacco Use  . Smoking status: Former Smoker    Types: Cigarettes    Quit date: 09/12/2000    Years since quitting: 19.6  . Smokeless tobacco: Never Used  Vaping Use  . Vaping Use: Never used  Substance and Sexual Activity  . Alcohol use: Not Currently    Comment: occasional  . Drug use: Yes    Types: Marijuana    Comment: daily   . Sexual activity: Yes    Birth control/protection: None, Condom  Other Topics Concern  . Not on file  Social History Narrative  . Not on file   Social Determinants of Health   Financial Resource Strain: Not on file  Food Insecurity: Not on file  Transportation Needs: Not on file  Physical Activity: Not on file  Stress: Not on file  Social Connections: Not on file  Intimate Partner Violence: Not on file    Current Outpatient Medications on File Prior to Visit  Medication Sig Dispense Refill  . albuterol (VENTOLIN HFA) 108 (90 Base) MCG/ACT inhaler Inhale 2 puffs into the lungs every 6 (six) hours as needed for wheezing or shortness of breath. 8 g 11  . ALPRAZolam (XANAX) 0.5 MG tablet Take 1 tablet by mouth twice daily as needed for anxiety 60 tablet 0  . amphetamine-dextroamphetamine (  ADDERALL) 15 MG tablet Take 1 tablet by mouth 2 (two) times daily. 60 tablet 0  . ARIPiprazole (ABILIFY) 10 MG tablet Take 1 tablet (10 mg total) by mouth daily. 30 tablet 2  . Artificial Tear Ointment (DRY EYES OP) Place 1-2 drops into both eyes daily as needed (dry eyes).     Marland Kitchen aspirin EC 81 MG tablet Take 1 tablet (81 mg total) by mouth 2 (two) times daily. 60 tablet 2  . atorvastatin (LIPITOR) 40 MG tablet Take 1 tablet (40 mg total) by mouth every evening. (Patient taking differently: Take 40 mg by mouth every morning.) 90 tablet 2  . famotidine (PEPCID) 20 MG tablet Take 1 tablet (20 mg total) by mouth 2 (two) times daily. 60 tablet 11  . FLUoxetine (PROZAC) 40 MG capsule Take 2  capsules (80 mg total) by mouth every morning. 60 capsule 2  . gabapentin (NEURONTIN) 800 MG tablet TAKE 2 TABLETS BY MOUTH IN THE MORNING AND 3 TABLETS  IN THE EVENING 150 tablet 11  . gentamicin cream (GARAMYCIN) 0.1 % Apply 1 application topically 2 (two) times daily. 30 g 1  . hydrOXYzine (ATARAX/VISTARIL) 50 MG tablet Take 1 tablet (50 mg total) by mouth 3 (three) times daily as needed. 90 tablet 2  . metFORMIN (GLUCOPHAGE) 1000 MG tablet TAKE 1 TABLET BY MOUTH TWICE DAILY WITH A MEAL 180 tablet 0  . Multiple Vitamins-Minerals (MULTIVITAMIN GUMMIES ADULT PO) Take 1 capsule by mouth daily.    . naproxen sodium (ALEVE) 220 MG tablet Take 220 mg by mouth daily as needed (headache).    . pantoprazole (PROTONIX) 40 MG tablet Take 1 tablet (40 mg total) by mouth daily. 90 tablet 3  . spironolactone (ALDACTONE) 100 MG tablet Take 1 tablet (100 mg total) by mouth daily. 30 tablet 11  . traZODone (DESYREL) 50 MG tablet Take 1 tablet (50 mg total) by mouth at bedtime. 30 tablet 2  . torsemide (DEMADEX) 5 MG tablet Take 1 tablet (5 mg total) by mouth daily for 5 days. 5 tablet 0   No current facility-administered medications on file prior to visit.    No Known Allergies  Family History  Problem Relation Age of Onset  . Depression Mother   . Anxiety disorder Mother   . Diabetes Mother   . Diabetes Father   . Diabetes Brother     BP 132/82 (BP Location: Right Arm, Patient Position: Sitting, Cuff Size: Large)   Pulse 86   Ht 5\' 4"  (1.626 m)   Wt 178 lb 9.6 oz (81 kg)   SpO2 99%   BMI 30.66 kg/m    Review of Systems She denies hypoglycemia.     Objective:   Physical Exam VITAL SIGNS:  See vs page GENERAL: no distress Pulses: dorsalis pedis intact bilat.   MSK: no deformity of the feet, except right great toe is surgically absent.   CV: no leg edema Skin:  no ulcer on the feet.  normal color and temp on the feet. Neuro: sensation is intact to touch on the feet, but severely  decreased from normal.  Lab Results  Component Value Date   HGBA1C 11.6 (A) 04/30/2020       Assessment & Plan:  Type 1 DM: uncontrolled Noncompliance with cbg recording and insulin.  She is not a candidate for multiple daily injections. HTN: is noted today   Patient Instructions  check your blood sugar twice a day: before the 3 meals, and at bedtime.  also check if you have symptoms of your blood sugar being too high or too low.  please keep a record of the readings and bring it to your next appointment here (or you can bring the meter itself).  You can write it on any piece of paper.  please call us sooner if your blood sugar goes below 70, or if you have a lot of readings over 200. Please continue the same metformin and insulin.   Here is a new meter.  I have sent a prescription to your pharmacy, for strips.  Your blood pressure is high today.  Please see your primary care provider soon, to have it rechecked.   Best wishes on your move to [place]

## 2020-04-30 NOTE — Telephone Encounter (Signed)
Pt left message stating she seen Dr Everardo All and he said he signed the form he was willing to sign and he was not sure what we are talking about. He did give her a rx for 2 pr of shoes.She stated she was moving in 3 wks and would like to get them before she leaves.  I returned call and explained that the form he refuses to sign is required by medicare to get the shoes and if he is not going to sign it we cannot get the shoes. She said he said he would not sign it because of the wording on it. I apologized but we cannot get the shoes without this form. I even read what the document said and he originally signed it but did not date it and then marked it out and wrote in epic. He told pt to take rx to [facility] and I told her they are not doing diabetic shoes any more. She said she would just start over in [place] once she moves and I told pt if she needed anything once she gets est there to please call us.

## 2020-04-30 NOTE — Patient Instructions (Addendum)
check your blood sugar twice a day: before the 3 meals, and at bedtime.  also check if you have symptoms of your blood sugar being too high or too low.  please keep a record of the readings and bring it to your next appointment here (or you can bring the meter itself).  You can write it on any piece of paper.  please call us sooner if your blood sugar goes below 70, or if you have a lot of readings over 200. Please continue the same metformin and insulin.   Here is a new meter.  I have sent a prescription to your pharmacy, for strips.  Your blood pressure is high today.  Please see your primary care provider soon, to have it rechecked.   Best wishes on your move to [place]

## 2020-05-06 ENCOUNTER — Other Ambulatory Visit: Payer: Self-pay | Admitting: Endocrinology

## 2020-05-06 MED ORDER — NOVOLOG MIX 70/30 FLEXPEN (70-30) 100 UNIT/ML ~~LOC~~ SUPN: 30.0000 [IU] | PEN_INJECTOR | Freq: Every day | SUBCUTANEOUS | 3 refills | Status: AC

## 2020-05-10 ENCOUNTER — Telehealth (HOSPITAL_COMMUNITY): Payer: Medicare Other | Admitting: Psychiatry

## 2020-06-07 ENCOUNTER — Other Ambulatory Visit: Payer: Self-pay | Admitting: Nurse Practitioner

## 2020-06-07 DIAGNOSIS — E119 Type 2 diabetes mellitus without complications: Secondary | ICD-10-CM

## 2020-06-07 DIAGNOSIS — E1149 Type 2 diabetes mellitus with other diabetic neurological complication: Secondary | ICD-10-CM

## 2020-07-21 ENCOUNTER — Telehealth: Payer: Self-pay | Admitting: Endocrinology

## 2020-07-21 NOTE — Telephone Encounter (Signed)
Please F/U with this  

## 2020-07-21 NOTE — Telephone Encounter (Signed)
Pt called to give [place] the fax number of where she would like her referral sent to.  [facility]  Attention: Vernona Rieger  Fax# [phone]

## 2020-07-22 ENCOUNTER — Other Ambulatory Visit: Payer: Self-pay | Admitting: Endocrinology

## 2020-07-22 DIAGNOSIS — E1065 Type 1 diabetes mellitus with hyperglycemia: Secondary | ICD-10-CM

## 2020-08-01 ENCOUNTER — Other Ambulatory Visit: Payer: Self-pay | Admitting: Nurse Practitioner

## 2020-08-01 ENCOUNTER — Other Ambulatory Visit (HOSPITAL_COMMUNITY): Payer: Self-pay | Admitting: Psychiatry

## 2020-08-01 DIAGNOSIS — K219 Gastro-esophageal reflux disease without esophagitis: Secondary | ICD-10-CM

## 2020-08-01 DIAGNOSIS — E1149 Type 2 diabetes mellitus with other diabetic neurological complication: Secondary | ICD-10-CM

## 2020-08-01 DIAGNOSIS — E119 Type 2 diabetes mellitus without complications: Secondary | ICD-10-CM

## 2020-08-01 DIAGNOSIS — F314 Bipolar disorder, current episode depressed, severe, without psychotic features: Secondary | ICD-10-CM

## 2020-08-01 DIAGNOSIS — I10 Essential (primary) hypertension: Secondary | ICD-10-CM

## 2020-08-14 ENCOUNTER — Other Ambulatory Visit (HOSPITAL_COMMUNITY): Payer: Self-pay | Admitting: Psychiatry

## 2020-08-14 ENCOUNTER — Other Ambulatory Visit: Payer: Self-pay | Admitting: Nurse Practitioner

## 2020-08-14 DIAGNOSIS — I10 Essential (primary) hypertension: Secondary | ICD-10-CM

## 2020-08-14 DIAGNOSIS — F314 Bipolar disorder, current episode depressed, severe, without psychotic features: Secondary | ICD-10-CM

## 2020-08-14 DIAGNOSIS — K219 Gastro-esophageal reflux disease without esophagitis: Secondary | ICD-10-CM

## 2020-08-14 IMAGING — CR DG TOE GREAT 2+V*R*
3 series · 3 of 3 positions shown · non-contrast
Comparison: 04/30/2019

CLINICAL DATA: Tripped and fell 2 weeks ago

EXAM:
RIGHT GREAT TOE

[toe ap]
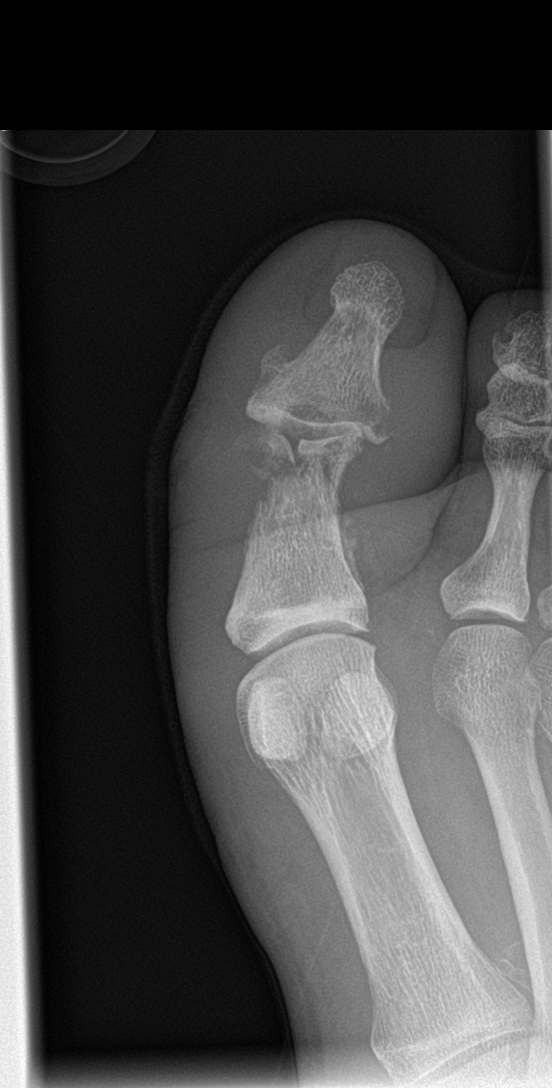

[toe obl]
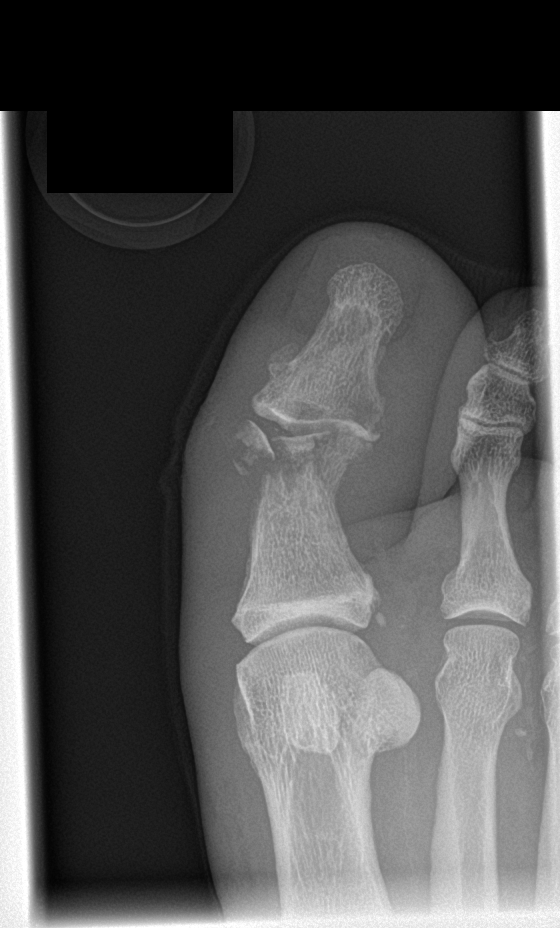

[toe lat]
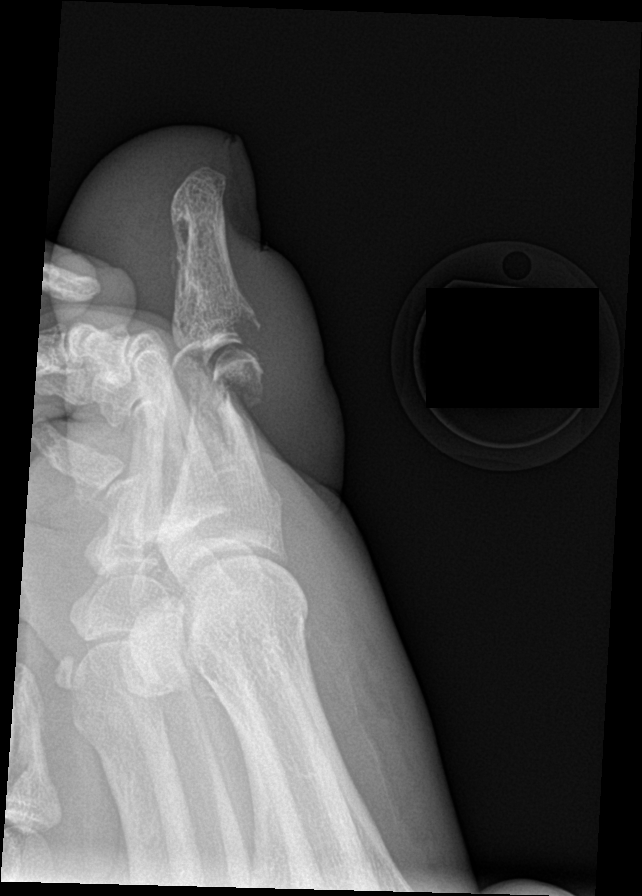

[3 of 3 positions shown; findings below may reference images not displayed]

FINDINGS: Frontal, oblique, lateral views of the right great toe are obtained.
There is abnormal appearance of the distal margin of the first
proximal phalanx with periosteal reaction and overlying soft tissue
swelling, consistent with osteomyelitis.

Superimposed upon this there is a comminuted fracture of the medial
aspect distal margin first proximal phalanx.

There are also erosive changes along the dorsal proximal aspect of
the first distal phalanx consistent with osteomyelitis and likely
septic arthritis of the first interphalangeal joint.

There is diffuse soft tissue edema of the first digit.
IMPRESSION: 1. Radiographic evidence of septic arthritis and osteomyelitis
surrounding the first interphalangeal joint.
2. Superimposed fracture through the distal medial aspect of the
first proximal phalanx.
3. Extensive soft tissue edema.

## 2020-08-18 ENCOUNTER — Other Ambulatory Visit: Payer: Self-pay | Admitting: Nurse Practitioner

## 2020-08-18 ENCOUNTER — Other Ambulatory Visit (HOSPITAL_COMMUNITY): Payer: Self-pay | Admitting: Psychiatry

## 2020-08-18 DIAGNOSIS — I10 Essential (primary) hypertension: Secondary | ICD-10-CM

## 2020-08-18 DIAGNOSIS — K219 Gastro-esophageal reflux disease without esophagitis: Secondary | ICD-10-CM

## 2020-08-18 DIAGNOSIS — F314 Bipolar disorder, current episode depressed, severe, without psychotic features: Secondary | ICD-10-CM

## 2020-08-20 ENCOUNTER — Other Ambulatory Visit (HOSPITAL_COMMUNITY): Payer: Self-pay | Admitting: Psychiatry

## 2020-08-20 ENCOUNTER — Other Ambulatory Visit: Payer: Self-pay | Admitting: Nurse Practitioner

## 2020-08-20 DIAGNOSIS — I10 Essential (primary) hypertension: Secondary | ICD-10-CM

## 2020-08-20 DIAGNOSIS — F314 Bipolar disorder, current episode depressed, severe, without psychotic features: Secondary | ICD-10-CM

## 2020-08-20 DIAGNOSIS — K219 Gastro-esophageal reflux disease without esophagitis: Secondary | ICD-10-CM

## 2020-09-10 ENCOUNTER — Other Ambulatory Visit: Payer: Self-pay | Admitting: Nurse Practitioner

## 2020-09-10 DIAGNOSIS — E1149 Type 2 diabetes mellitus with other diabetic neurological complication: Secondary | ICD-10-CM

## 2020-09-10 DIAGNOSIS — E119 Type 2 diabetes mellitus without complications: Secondary | ICD-10-CM

## 2020-09-27 ENCOUNTER — Telehealth: Payer: Self-pay | Admitting: Nurse Practitioner

## 2020-12-05 ENCOUNTER — Other Ambulatory Visit: Payer: Self-pay | Admitting: Nurse Practitioner

## 2020-12-05 DIAGNOSIS — E1149 Type 2 diabetes mellitus with other diabetic neurological complication: Secondary | ICD-10-CM

## 2020-12-13 ENCOUNTER — Other Ambulatory Visit: Payer: Self-pay | Admitting: Nurse Practitioner

## 2020-12-13 DIAGNOSIS — E1149 Type 2 diabetes mellitus with other diabetic neurological complication: Secondary | ICD-10-CM

## 2020-12-14 ENCOUNTER — Other Ambulatory Visit: Payer: Self-pay | Admitting: Nurse Practitioner

## 2020-12-14 DIAGNOSIS — E1149 Type 2 diabetes mellitus with other diabetic neurological complication: Secondary | ICD-10-CM

## 2021-02-12 ENCOUNTER — Other Ambulatory Visit (HOSPITAL_COMMUNITY): Payer: Self-pay | Admitting: Psychiatry

## 2021-02-12 DIAGNOSIS — F314 Bipolar disorder, current episode depressed, severe, without psychotic features: Secondary | ICD-10-CM

## 2022-02-06 ENCOUNTER — Telehealth: Payer: Self-pay

## 2022-02-06 NOTE — Telephone Encounter (Signed)
Pt is now living in [place] -
